# Patient Record
Sex: Female | Born: 1950 | Race: White | Hispanic: No | Smoking: Never smoker
Health system: Southern US, Community
[De-identification: ages and names within clinical notes are randomized; demographics above are authoritative.]

## PROBLEM LIST (undated history)

## (undated) DIAGNOSIS — I10 Essential (primary) hypertension: Secondary | ICD-10-CM

## (undated) DIAGNOSIS — M545 Low back pain, unspecified: Secondary | ICD-10-CM

## (undated) DIAGNOSIS — I509 Heart failure, unspecified: Secondary | ICD-10-CM

## (undated) DIAGNOSIS — F329 Major depressive disorder, single episode, unspecified: Secondary | ICD-10-CM

## (undated) DIAGNOSIS — F32A Depression, unspecified: Secondary | ICD-10-CM

## (undated) DIAGNOSIS — R609 Edema, unspecified: Secondary | ICD-10-CM

## (undated) DIAGNOSIS — R42 Dizziness and giddiness: Secondary | ICD-10-CM

## (undated) DIAGNOSIS — Z8669 Personal history of other diseases of the nervous system and sense organs: Secondary | ICD-10-CM

## (undated) DIAGNOSIS — R112 Nausea with vomiting, unspecified: Secondary | ICD-10-CM

## (undated) DIAGNOSIS — G629 Polyneuropathy, unspecified: Secondary | ICD-10-CM

## (undated) DIAGNOSIS — E785 Hyperlipidemia, unspecified: Secondary | ICD-10-CM

## (undated) DIAGNOSIS — K5900 Constipation, unspecified: Secondary | ICD-10-CM

## (undated) DIAGNOSIS — G4700 Insomnia, unspecified: Secondary | ICD-10-CM

## (undated) DIAGNOSIS — H269 Unspecified cataract: Secondary | ICD-10-CM

## (undated) DIAGNOSIS — R600 Localized edema: Secondary | ICD-10-CM

## (undated) DIAGNOSIS — M1990 Unspecified osteoarthritis, unspecified site: Secondary | ICD-10-CM

## (undated) DIAGNOSIS — Z8709 Personal history of other diseases of the respiratory system: Secondary | ICD-10-CM

## (undated) DIAGNOSIS — R39198 Other difficulties with micturition: Secondary | ICD-10-CM

## (undated) DIAGNOSIS — Z9889 Other specified postprocedural states: Secondary | ICD-10-CM

## (undated) HISTORY — DX: Essential (primary) hypertension: I10

## (undated) HISTORY — PX: UPPER GASTROINTESTINAL ENDOSCOPY: SHX188

## (undated) HISTORY — PX: DILATION AND CURETTAGE OF UTERUS: SHX78

## (undated) HISTORY — PX: HIP SURGERY: SHX245

## (undated) HISTORY — PX: OTHER SURGICAL HISTORY: SHX169

## (undated) HISTORY — DX: Unspecified osteoarthritis, unspecified site: M19.90

## (undated) HISTORY — PX: ESOPHAGOGASTRODUODENOSCOPY: SHX1529

## (undated) HISTORY — PX: COLONOSCOPY: SHX174

---

## 2000-01-23 ENCOUNTER — Inpatient Hospital Stay (HOSPITAL_COMMUNITY): Admission: AD | Admit: 2000-01-23 | Discharge: 2000-01-23 | Payer: Self-pay | Admitting: Obstetrics

## 2000-01-23 ENCOUNTER — Encounter: Payer: Self-pay | Admitting: Obstetrics & Gynecology

## 2000-03-14 ENCOUNTER — Ambulatory Visit (HOSPITAL_COMMUNITY): Admission: RE | Admit: 2000-03-14 | Discharge: 2000-03-14 | Payer: Self-pay | Admitting: Obstetrics and Gynecology

## 2000-06-18 ENCOUNTER — Ambulatory Visit (HOSPITAL_COMMUNITY): Admission: RE | Admit: 2000-06-18 | Discharge: 2000-06-18 | Payer: Self-pay | Admitting: Family Medicine

## 2001-09-23 ENCOUNTER — Ambulatory Visit (HOSPITAL_COMMUNITY): Admission: RE | Admit: 2001-09-23 | Discharge: 2001-09-23 | Payer: Self-pay | Admitting: Internal Medicine

## 2001-09-25 ENCOUNTER — Encounter: Payer: Self-pay | Admitting: Internal Medicine

## 2001-09-25 ENCOUNTER — Ambulatory Visit (HOSPITAL_COMMUNITY): Admission: RE | Admit: 2001-09-25 | Discharge: 2001-09-25 | Payer: Self-pay | Admitting: Internal Medicine

## 2003-11-02 ENCOUNTER — Ambulatory Visit: Payer: Self-pay | Admitting: *Deleted

## 2003-11-15 ENCOUNTER — Ambulatory Visit: Payer: Self-pay | Admitting: *Deleted

## 2003-11-22 ENCOUNTER — Ambulatory Visit: Payer: Self-pay | Admitting: *Deleted

## 2003-11-29 ENCOUNTER — Ambulatory Visit: Payer: Self-pay | Admitting: Nurse Practitioner

## 2004-02-22 ENCOUNTER — Ambulatory Visit: Payer: Self-pay | Admitting: Nurse Practitioner

## 2004-03-08 ENCOUNTER — Ambulatory Visit: Payer: Self-pay | Admitting: Nurse Practitioner

## 2004-03-08 ENCOUNTER — Other Ambulatory Visit: Admission: RE | Admit: 2004-03-08 | Discharge: 2004-03-08 | Payer: Self-pay

## 2004-03-16 ENCOUNTER — Ambulatory Visit (HOSPITAL_COMMUNITY): Admission: RE | Admit: 2004-03-16 | Discharge: 2004-03-16 | Payer: Self-pay | Admitting: Family Medicine

## 2004-05-15 ENCOUNTER — Ambulatory Visit: Payer: Self-pay | Admitting: Nurse Practitioner

## 2004-08-07 ENCOUNTER — Ambulatory Visit: Payer: Self-pay | Admitting: Nurse Practitioner

## 2004-10-31 ENCOUNTER — Ambulatory Visit: Payer: Self-pay | Admitting: Nurse Practitioner

## 2005-01-22 ENCOUNTER — Ambulatory Visit: Payer: Self-pay | Admitting: Nurse Practitioner

## 2005-03-22 ENCOUNTER — Ambulatory Visit: Payer: Self-pay | Admitting: Nurse Practitioner

## 2005-04-13 ENCOUNTER — Ambulatory Visit: Payer: Self-pay | Admitting: Internal Medicine

## 2005-07-05 ENCOUNTER — Ambulatory Visit: Payer: Self-pay | Admitting: Nurse Practitioner

## 2005-07-27 ENCOUNTER — Ambulatory Visit: Payer: Self-pay | Admitting: Nurse Practitioner

## 2005-09-28 ENCOUNTER — Ambulatory Visit: Payer: Self-pay | Admitting: Nurse Practitioner

## 2005-12-20 ENCOUNTER — Ambulatory Visit: Payer: Self-pay | Admitting: Nurse Practitioner

## 2006-01-28 ENCOUNTER — Ambulatory Visit: Payer: Self-pay | Admitting: Nurse Practitioner

## 2006-03-12 ENCOUNTER — Ambulatory Visit: Payer: Self-pay | Admitting: Family Medicine

## 2006-03-25 ENCOUNTER — Ambulatory Visit: Payer: Self-pay | Admitting: Nurse Practitioner

## 2006-03-25 ENCOUNTER — Encounter (INDEPENDENT_AMBULATORY_CARE_PROVIDER_SITE_OTHER): Payer: Self-pay | Admitting: Nurse Practitioner

## 2006-03-25 ENCOUNTER — Other Ambulatory Visit: Admission: RE | Admit: 2006-03-25 | Discharge: 2006-03-25 | Payer: Self-pay | Admitting: Nurse Practitioner

## 2006-03-29 ENCOUNTER — Ambulatory Visit (HOSPITAL_COMMUNITY): Admission: RE | Admit: 2006-03-29 | Discharge: 2006-03-29 | Payer: Self-pay | Admitting: Nurse Practitioner

## 2006-06-06 ENCOUNTER — Ambulatory Visit: Payer: Self-pay | Admitting: Nurse Practitioner

## 2006-08-28 ENCOUNTER — Ambulatory Visit: Payer: Self-pay | Admitting: Family Medicine

## 2006-09-30 ENCOUNTER — Encounter (INDEPENDENT_AMBULATORY_CARE_PROVIDER_SITE_OTHER): Payer: Self-pay | Admitting: Nurse Practitioner

## 2006-09-30 DIAGNOSIS — E119 Type 2 diabetes mellitus without complications: Secondary | ICD-10-CM

## 2006-09-30 DIAGNOSIS — I10 Essential (primary) hypertension: Secondary | ICD-10-CM | POA: Insufficient documentation

## 2006-09-30 DIAGNOSIS — Z9889 Other specified postprocedural states: Secondary | ICD-10-CM

## 2006-11-29 ENCOUNTER — Ambulatory Visit: Payer: Self-pay | Admitting: Internal Medicine

## 2007-02-17 ENCOUNTER — Ambulatory Visit: Payer: Self-pay | Admitting: Internal Medicine

## 2007-05-02 ENCOUNTER — Ambulatory Visit: Payer: Self-pay | Admitting: Internal Medicine

## 2007-05-19 ENCOUNTER — Ambulatory Visit: Payer: Self-pay | Admitting: Internal Medicine

## 2007-08-12 ENCOUNTER — Ambulatory Visit: Payer: Self-pay | Admitting: Internal Medicine

## 2007-11-10 ENCOUNTER — Ambulatory Visit: Payer: Self-pay | Admitting: Internal Medicine

## 2007-11-10 ENCOUNTER — Encounter (INDEPENDENT_AMBULATORY_CARE_PROVIDER_SITE_OTHER): Payer: Self-pay | Admitting: Family Medicine

## 2007-11-10 LAB — CONVERTED CEMR LAB
Albumin: 4.7 g/dL (ref 3.5–5.2)
BUN: 15 mg/dL (ref 6–23)
Calcium: 10.5 mg/dL (ref 8.4–10.5)
Chloride: 100 meq/L (ref 96–112)
Glucose, Bld: 295 mg/dL — ABNORMAL HIGH (ref 70–99)
Potassium: 4 meq/L (ref 3.5–5.3)

## 2007-11-27 ENCOUNTER — Ambulatory Visit: Payer: Self-pay | Admitting: Internal Medicine

## 2007-11-27 ENCOUNTER — Ambulatory Visit (HOSPITAL_COMMUNITY): Admission: RE | Admit: 2007-11-27 | Discharge: 2007-11-27 | Payer: Self-pay | Admitting: Internal Medicine

## 2008-01-14 ENCOUNTER — Ambulatory Visit: Payer: Self-pay | Admitting: Internal Medicine

## 2008-07-20 ENCOUNTER — Encounter (INDEPENDENT_AMBULATORY_CARE_PROVIDER_SITE_OTHER): Payer: Self-pay | Admitting: Internal Medicine

## 2008-07-20 ENCOUNTER — Ambulatory Visit: Payer: Self-pay | Admitting: Internal Medicine

## 2008-07-20 LAB — CONVERTED CEMR LAB
AST: 17 units/L (ref 0–37)
Albumin: 4.4 g/dL (ref 3.5–5.2)
Alkaline Phosphatase: 75 units/L (ref 39–117)
BUN: 24 mg/dL — ABNORMAL HIGH (ref 6–23)
HDL: 31 mg/dL — ABNORMAL LOW (ref >39)
LDL Cholesterol: 72 mg/dL (ref 0–99)
Potassium: 4.2 meq/L (ref 3.5–5.3)
Sodium: 142 meq/L (ref 135–145)
VLDL: 40 mg/dL (ref 0–40)

## 2008-07-28 ENCOUNTER — Ambulatory Visit: Payer: Self-pay | Admitting: Internal Medicine

## 2008-08-25 ENCOUNTER — Ambulatory Visit: Payer: Self-pay | Admitting: Family Medicine

## 2008-10-28 ENCOUNTER — Ambulatory Visit: Payer: Self-pay | Admitting: Internal Medicine

## 2008-10-28 ENCOUNTER — Encounter (INDEPENDENT_AMBULATORY_CARE_PROVIDER_SITE_OTHER): Payer: Self-pay | Admitting: Internal Medicine

## 2008-10-28 LAB — CONVERTED CEMR LAB
Cholesterol: 134 mg/dL (ref 0–200)
Total CHOL/HDL Ratio: 4.3

## 2008-11-04 ENCOUNTER — Ambulatory Visit (HOSPITAL_COMMUNITY): Admission: RE | Admit: 2008-11-04 | Discharge: 2008-11-04 | Payer: Self-pay | Admitting: Internal Medicine

## 2009-01-26 ENCOUNTER — Ambulatory Visit: Payer: Self-pay | Admitting: Internal Medicine

## 2009-01-26 ENCOUNTER — Encounter (INDEPENDENT_AMBULATORY_CARE_PROVIDER_SITE_OTHER): Payer: Self-pay | Admitting: Internal Medicine

## 2009-01-26 LAB — CONVERTED CEMR LAB
HDL: 34 mg/dL — ABNORMAL LOW (ref >39)
LDL Cholesterol: 50 mg/dL (ref 0–99)
Total CHOL/HDL Ratio: 3.2
Triglycerides: 120 mg/dL (ref <150)
VLDL: 24 mg/dL (ref 0–40)

## 2009-01-28 ENCOUNTER — Ambulatory Visit: Payer: Self-pay | Admitting: Internal Medicine

## 2009-01-28 ENCOUNTER — Encounter (INDEPENDENT_AMBULATORY_CARE_PROVIDER_SITE_OTHER): Payer: Self-pay | Admitting: Internal Medicine

## 2009-01-29 ENCOUNTER — Encounter (INDEPENDENT_AMBULATORY_CARE_PROVIDER_SITE_OTHER): Payer: Self-pay | Admitting: Internal Medicine

## 2009-03-25 ENCOUNTER — Ambulatory Visit: Payer: Self-pay | Admitting: Internal Medicine

## 2009-03-25 ENCOUNTER — Telehealth (INDEPENDENT_AMBULATORY_CARE_PROVIDER_SITE_OTHER): Payer: Self-pay | Admitting: *Deleted

## 2009-04-01 ENCOUNTER — Ambulatory Visit: Payer: Self-pay | Admitting: Family Medicine

## 2009-04-26 ENCOUNTER — Encounter (INDEPENDENT_AMBULATORY_CARE_PROVIDER_SITE_OTHER): Payer: Self-pay | Admitting: *Deleted

## 2009-05-17 ENCOUNTER — Ambulatory Visit: Payer: Self-pay | Admitting: Internal Medicine

## 2009-05-17 LAB — CONVERTED CEMR LAB
BUN: 18 mg/dL (ref 6–23)
Calcium: 9.9 mg/dL (ref 8.4–10.5)
Cholesterol: 141 mg/dL (ref 0–200)
Creatinine, Ser: 0.98 mg/dL (ref 0.40–1.20)
Hgb A1c MFr Bld: 5.8 % (ref 4.6–6.1)
Potassium: 4.3 meq/L (ref 3.5–5.3)
Triglycerides: 174 mg/dL — ABNORMAL HIGH (ref <150)

## 2009-05-23 ENCOUNTER — Ambulatory Visit: Payer: Self-pay | Admitting: Internal Medicine

## 2009-07-19 ENCOUNTER — Ambulatory Visit: Payer: Self-pay | Admitting: Internal Medicine

## 2009-07-19 LAB — CONVERTED CEMR LAB
BUN: 18 mg/dL
CO2: 24 meq/L
Calcium: 9.7 mg/dL
Chloride: 103 meq/L
Cholesterol: 139 mg/dL
Creatinine, Ser: 0.96 mg/dL
Glucose, Bld: 89 mg/dL
HDL: 40 mg/dL
Hgb A1c MFr Bld: 5.7 % — ABNORMAL HIGH
LDL Cholesterol: 67 mg/dL
Potassium: 4.2 meq/L
Sodium: 140 meq/L
Total CHOL/HDL Ratio: 3.5
Triglycerides: 162 mg/dL — ABNORMAL HIGH
VLDL: 32 mg/dL

## 2009-08-01 ENCOUNTER — Ambulatory Visit: Payer: Self-pay | Admitting: Internal Medicine

## 2009-11-01 ENCOUNTER — Ambulatory Visit: Payer: Self-pay | Admitting: Internal Medicine

## 2009-11-01 LAB — CONVERTED CEMR LAB
ALT: 16 units/L (ref 0–35)
AST: 21 units/L (ref 0–37)
Alkaline Phosphatase: 62 units/L (ref 39–117)
CO2: 25 meq/L (ref 19–32)
LDL Cholesterol: 56 mg/dL (ref 0–99)
Magnesium: 1.4 mg/dL — ABNORMAL LOW (ref 1.5–2.5)
Sodium: 139 meq/L (ref 135–145)
Total Bilirubin: 0.4 mg/dL (ref 0.3–1.2)
Total Protein: 6.7 g/dL (ref 6.0–8.3)
VLDL: 31 mg/dL (ref 0–40)

## 2009-11-07 ENCOUNTER — Ambulatory Visit: Payer: Self-pay | Admitting: Internal Medicine

## 2010-01-10 ENCOUNTER — Encounter (INDEPENDENT_AMBULATORY_CARE_PROVIDER_SITE_OTHER): Payer: Self-pay | Admitting: *Deleted

## 2010-01-10 LAB — CONVERTED CEMR LAB: Magnesium: 1.6 mg/dL (ref 1.5–2.5)

## 2010-03-28 NOTE — Progress Notes (Signed)
Summary: triage/possible bronchitis  Phone Note Call from Patient   Caller: Patient Reason for Call: Talk to Nurse Summary of Call: Patient called stating she has been sick since Saturday..she has a cold and non-productive cough. She is afraid she has bronchitis and has a runny nose and chest congestiion..her throat is sore.Marland KitchenMarland KitchenThe patient has a history on HTN and DM .Marland Kitchenshe has tried OTC cloricidin and it isn't helping.Marland KitchenMarland KitchenAppointment made for today. Initial call taken by: Conchita Paris,  March 25, 2009 10:37 AM

## 2010-03-28 NOTE — Letter (Signed)
Summary: [facility] Referral (unable to schedule) Notification   Gastroenterology  [address] [place], [place] [address]   Phone: [phone]  Fax: [phone]      April 26, 2009 Jodi Chavez 27-Jan-1951 MRN: [id number]   Dow Adolph, M.D. [address] [place], [place]. [address]    Dear Dr. Audria Nine:   Thank you for your kind referral of the above patient. We have attempted to schedule the recommended Colonoscopy but have been unable to schedule because:  _x_ The patient was not available by phone and/or has not returned our calls.  __ The patient declined to schedule the procedure at this time.  We appreciate the referral and hope that we will have the opportunity to treat this patient in the future.    Sincerely,   [facility]  Vania Rea. Jarold Motto M.D. Hedwig Morton. Juanda Chance M.D. Venita Lick. Russella Dar M.D. Wilhemina Bonito. Marina Goodell M.D. Barbette Hair. Arlyce Dice M.D. Iva Boop M.D. Cheron Every.D.

## 2010-07-14 NOTE — Op Note (Signed)
[facility]  Patient:    Jodi Chavez, Jodi Chavez                       MRN: [id number] Adm. Date:  [id number] Disc. Date: [id number] Attending:  Lendon Colonel                           Operative Report  PREOPERATIVE DIAGNOSES:       1. Abnormal ultrasound.                               2. Postmenopausal bleeding.  POSTOPERATIVE DIAGNOSES:      1. Abnormal ultrasound.                               2. Postmenopausal bleeding.                               3. Uterine enlargement.  PROCEDURE:                    1. Hysteroscopy.                               2. Dilatation and curettage.  SURGEON:                      Katherine Roan, M.D.  ANESTHESIA:  DESCRIPTION OF PROCEDURE:     Patient was carefully placed in lithotomy position with Allen stirrups and cervix dilated and hysteroscope was advanced into the uterus after prep and drape and the bladder was emptied. The uterus was pretty smooth throughout, there were some small polyps in left cornu and in the fundal region and these were resected. A sharp curettage was then accomplished and all of the tissue was sent to the lab for study. Jodi Chavez tolerated the procedure well. Fluid deficit was about 100 cc and she was sent to the recovery room in good addition. DD:  03/14/00 TD:  03/15/00 Job: [id number]

## 2010-07-31 ENCOUNTER — Other Ambulatory Visit (HOSPITAL_COMMUNITY): Payer: Self-pay | Admitting: Family Medicine

## 2010-07-31 DIAGNOSIS — Z1231 Encounter for screening mammogram for malignant neoplasm of breast: Secondary | ICD-10-CM

## 2010-08-10 ENCOUNTER — Ambulatory Visit (HOSPITAL_COMMUNITY)
Admission: RE | Admit: 2010-08-10 | Discharge: 2010-08-10 | Disposition: A | Discharge status: Home / Self Care | Payer: Medicare Other | Source: Ambulatory Visit | Attending: Family Medicine | Admitting: Family Medicine

## 2010-08-10 DIAGNOSIS — Z1231 Encounter for screening mammogram for malignant neoplasm of breast: Secondary | ICD-10-CM | POA: Insufficient documentation

## 2011-05-10 ENCOUNTER — Emergency Department (HOSPITAL_COMMUNITY): Payer: Medicare Other

## 2011-05-10 ENCOUNTER — Emergency Department (HOSPITAL_COMMUNITY)
Admission: EM | Admit: 2011-05-10 | Discharge: 2011-05-10 | Disposition: A | Discharge status: Home / Self Care | Payer: Medicare Other | Attending: Emergency Medicine | Admitting: Emergency Medicine

## 2011-05-10 ENCOUNTER — Encounter (HOSPITAL_COMMUNITY): Payer: Self-pay | Admitting: *Deleted

## 2011-05-10 DIAGNOSIS — M25529 Pain in unspecified elbow: Secondary | ICD-10-CM

## 2011-05-10 DIAGNOSIS — E119 Type 2 diabetes mellitus without complications: Secondary | ICD-10-CM | POA: Insufficient documentation

## 2011-05-10 DIAGNOSIS — I10 Essential (primary) hypertension: Secondary | ICD-10-CM | POA: Insufficient documentation

## 2011-05-10 MED ORDER — OXYCODONE-ACETAMINOPHEN 5-325 MG PO TABS
1.0000 | ORAL_TABLET | Freq: Once | ORAL | Status: AC
  Administered 2011-05-10: 1 via ORAL
  Filled 2011-05-10: qty 1

## 2011-05-10 MED ORDER — HYDROCODONE-ACETAMINOPHEN 5-500 MG PO TABS: 1.0000 | ORAL_TABLET | Freq: Four times a day (QID) | ORAL | Status: AC | PRN

## 2011-05-10 NOTE — ED Notes (Signed)
Pt reports pain at right elbow. States started yesterday, states she slept on it wrong. Pt reports pain with extension and flexion.

## 2011-05-10 NOTE — ED Provider Notes (Signed)
History     CSN: [id number]  Arrival date & time 05/10/11  1514   First MD Initiated Contact with Patient 05/10/11 1543      Chief Complaint  Patient presents with  . Elbow Pain    (Consider location/radiation/quality/duration/timing/severity/associated sxs/prior treatment) Patient is a 61 y.o. female presenting with extremity pain.  Extremity Pain This is a new problem. The current episode started yesterday. The problem occurs constantly. The problem has been gradually worsening. Associated symptoms include arthralgias. Pertinent negatives include no joint swelling, myalgias, numbness or weakness. The symptoms are aggravated by bending. She has tried nothing for the symptoms.    Past Medical History  Diagnosis Date  . Diabetes mellitus   . Hypertension   . Arthritis     Past Surgical History  Procedure Date  . Hip surgery     Family History  Problem Relation Age of Onset  . Cancer Mother   . Stroke Mother   . Diabetes Mother   . Hypertension Mother   . Gout Father     History  Substance Use Topics  . Smoking status: Never Smoker   . Smokeless tobacco: Not on file  . Alcohol Use: No    OB History    Grav Para Term Preterm Abortions TAB SAB Ect Mult Living                  Review of Systems  Musculoskeletal: Positive for arthralgias. Negative for myalgias and joint swelling.  Neurological: Negative for weakness and numbness.  All other systems reviewed and are negative.    Allergies  Seasonal and Naproxen sodium  Home Medications   Current Outpatient Rx  Name Route Sig Dispense Refill  . ACETAMINOPHEN 500 MG PO TABS Oral Take 1,000 mg by mouth every 6 (six) hours as needed. For pain    . BENAZEPRIL HCL 40 MG PO TABS Oral Take 40 mg by mouth daily.    Marland Kitchen CALCIUM CARBONATE-VITAMIN D 500-200 MG-UNIT PO TABS Oral Take 2 tablets by mouth daily.    Marland Kitchen DIPHENHYDRAMINE HCL 25 MG PO TABS Oral Take 25 mg by mouth every 6 (six) hours as needed. For allergies     . EZETIMIBE 10 MG PO TABS Oral Take 10 mg by mouth daily.    Marland Kitchen GABAPENTIN 300 MG PO CAPS Oral Take 300 mg by mouth 3 (three) times daily.    Marland Kitchen GARLIC 1000 MG PO CAPS Oral Take 1 capsule by mouth 3 (three) times daily.    Marland Kitchen GLIMEPIRIDE 2 MG PO TABS Oral Take 2 mg by mouth daily before breakfast.    . HYDROCHLOROTHIAZIDE 25 MG PO TABS Oral Take 25 mg by mouth daily.    Marland Kitchen METFORMIN HCL 850 MG PO TABS Oral Take 850 mg by mouth daily.    . ADULT MULTIVITAMIN W/MINERALS CH Oral Take 1 tablet by mouth daily.    Marland Kitchen PROPRANOLOL HCL 10 MG PO TABS Oral Take 10 mg by mouth 3 (three) times daily.    Marland Kitchen SIMVASTATIN 40 MG PO TABS Oral Take 40 mg by mouth every evening.      BP 159/82  Pulse 88  Temp(Src) 97.4 F (36.3 C) (Oral)  Resp 18  SpO2 97%  Physical Exam  Nursing note and vitals reviewed. Constitutional: She is oriented to person, place, and time. She appears well-developed and well-nourished.  HENT:  Head: Normocephalic and atraumatic.  Eyes: Pupils are equal, round, and reactive to light.  Neck: Normal range of motion.  Neck supple.  Cardiovascular: Normal rate, regular rhythm, normal heart sounds and intact distal pulses.   Pulmonary/Chest: Effort normal and breath sounds normal.  Abdominal: Soft. Bowel sounds are normal.  Musculoskeletal: Normal range of motion. She exhibits tenderness. She exhibits no edema.       Right elbow: She exhibits no swelling and no deformity. tenderness found.       Arms: Lymphadenopathy:    She has no cervical adenopathy.  Neurological: She is alert and oriented to person, place, and time.  Skin: Skin is warm and dry.  Psychiatric: She has a normal mood and affect. Her behavior is normal. Judgment and thought content normal.    ED Course  Procedures (including critical care time)  Labs Reviewed - No data to display No results found.   No diagnosis found.  Radiology results reviewed and discussed with patient.  MDM          Jimmye Norman, NP 05/10/11 2328

## 2011-05-10 NOTE — Discharge Instructions (Signed)
Arthralgia Your caregiver has diagnosed you as suffering from an arthralgia. Arthralgia means there is pain in a joint. This can come from many reasons including:  Bruising the joint which causes soreness (inflammation) in the joint.   Wear and tear on the joints which occur as we grow older (osteoarthritis).   Overusing the joint.   Various forms of arthritis.   Infections of the joint.  Regardless of the cause of pain in your joint, most of these different pains respond to anti-inflammatory drugs and rest. The exception to this is when a joint is infected, and these cases are treated with antibiotics, if it is a bacterial infection. HOME CARE INSTRUCTIONS   Rest the injured area for as long as directed by your caregiver. Then slowly start using the joint as directed by your caregiver and as the pain allows. Crutches as directed may be useful if the ankles, knees or hips are involved. If the knee was splinted or casted, continue use and care as directed. If an stretchy or elastic wrapping bandage has been applied today, it should be removed and re-applied every 3 to 4 hours. It should not be applied tightly, but firmly enough to keep swelling down. Watch toes and feet for swelling, bluish discoloration, coldness, numbness or excessive pain. If any of these problems (symptoms) occur, remove the ace bandage and re-apply more loosely. If these symptoms persist, contact your caregiver or return to this location.   For the first 24 hours, keep the injured extremity elevated on pillows while lying down.   Apply ice for 15 to 20 minutes to the sore joint every couple hours while awake for the first half day. Then 3 to 4 times per day for the first 48 hours. Put the ice in a plastic bag and place a towel between the bag of ice and your skin.   Wear any splinting, casting, elastic bandage applications, or slings as instructed.   Only take over-the-counter or prescription medicines for pain,  discomfort, or fever as directed by your caregiver. Do not use aspirin immediately after the injury unless instructed by your physician. Aspirin can cause increased bleeding and bruising of the tissues.   If you were given crutches, continue to use them as instructed and do not resume weight bearing on the sore joint until instructed.  Persistent pain and inability to use the sore joint as directed for more than 2 to 3 days are warning signs indicating that you should see a caregiver for a follow-up visit as soon as possible. Initially, a hairline fracture (break in bone) may not be evident on X-rays. Persistent pain and swelling indicate that further evaluation, non-weight bearing or use of the joint (use of crutches or slings as instructed), or further X-rays are indicated. X-rays may sometimes not show a small fracture until a week or 10 days later. Make a follow-up appointment with your own caregiver or one to whom we have referred you. A radiologist (specialist in reading X-rays) may read your X-rays. Make sure you know how you are to obtain your X-ray results. Do not assume everything is normal if you do not hear from us. SEEK MEDICAL CARE IF: Bruising, swelling, or pain increases. SEEK IMMEDIATE MEDICAL CARE IF:   Your fingers or toes are numb or blue.   The pain is not responding to medications and continues to stay the same or get worse.   The pain in your joint becomes severe.   You develop a fever over   102 F (38.9 C).   It becomes impossible to move or use the joint.  MAKE SURE YOU:   Understand these instructions.   Will watch your condition.   Will get help right away if you are not doing well or get worse.  Document Released: 02/12/2005 Document Revised: 02/01/2011 Document Reviewed: 10/01/2007 [employer] Patient Information 2012 [employer]. 

## 2011-05-14 ENCOUNTER — Emergency Department (HOSPITAL_COMMUNITY)
Admission: EM | Admit: 2011-05-14 | Discharge: 2011-05-14 | Disposition: A | Discharge status: Home / Self Care | Payer: Medicare Other | Attending: Emergency Medicine | Admitting: Emergency Medicine

## 2011-05-14 ENCOUNTER — Encounter (HOSPITAL_COMMUNITY): Payer: Self-pay

## 2011-05-14 DIAGNOSIS — I10 Essential (primary) hypertension: Secondary | ICD-10-CM | POA: Insufficient documentation

## 2011-05-14 DIAGNOSIS — M25529 Pain in unspecified elbow: Secondary | ICD-10-CM | POA: Insufficient documentation

## 2011-05-14 DIAGNOSIS — M25521 Pain in right elbow: Secondary | ICD-10-CM

## 2011-05-14 DIAGNOSIS — Z79899 Other long term (current) drug therapy: Secondary | ICD-10-CM | POA: Insufficient documentation

## 2011-05-14 DIAGNOSIS — M129 Arthropathy, unspecified: Secondary | ICD-10-CM | POA: Insufficient documentation

## 2011-05-14 DIAGNOSIS — E119 Type 2 diabetes mellitus without complications: Secondary | ICD-10-CM | POA: Insufficient documentation

## 2011-05-14 MED ORDER — MELOXICAM 7.5 MG PO TABS: 7.5000 mg | ORAL_TABLET | Freq: Every day | ORAL | Status: DC

## 2011-05-14 MED ORDER — HYDROCODONE-ACETAMINOPHEN 5-500 MG PO TABS: 1.0000 | ORAL_TABLET | Freq: Three times a day (TID) | ORAL | Status: AC | PRN

## 2011-05-14 NOTE — ED Provider Notes (Signed)
Medical screening examination/treatment/procedure(s) were performed by non-physician practitioner and as supervising physician I was immediately available for consultation/collaboration.  Doug Sou, MD 05/14/11 [phone]

## 2011-05-14 NOTE — Progress Notes (Signed)
Orthopedic Tech Progress Note Patient Details:  Jodi Chavez November 30, 1950 [id number]  Other Ortho Devices Type of Ortho Device: Other (comment) (arm sling) Ortho Device Location: (R) UE Ortho Device Interventions: Application   Jennye Moccasin 05/14/2011, 6:33 PM

## 2011-05-14 NOTE — ED Notes (Signed)
Right arm pain worse since last Wednesday.

## 2011-05-14 NOTE — ED Provider Notes (Signed)
History     CSN: [id number]  Arrival date & time 05/14/11  1424   First MD Initiated Contact with Patient 05/14/11 1750      Chief Complaint  Patient presents with  . Arm Pain    (Consider location/radiation/quality/duration/timing/severity/associated sxs/prior treatment) Patient is a 61 y.o. female presenting with arm pain.  Arm Pain This is a recurrent problem. The current episode started 1 to 4 weeks ago. The problem occurs constantly. The problem has been unchanged. Associated symptoms include arthralgias. The symptoms are aggravated by bending. She has tried oral narcotics for the symptoms. The treatment provided moderate relief.  Patient seen on 05/10/11 for same.  Patient out of pain medication.  Has an appointment with her PCP on 05/28/11.  Past Medical History  Diagnosis Date  . Diabetes mellitus   . Hypertension   . Arthritis     Past Surgical History  Procedure Date  . Hip surgery     Family History  Problem Relation Age of Onset  . Cancer Mother   . Stroke Mother   . Diabetes Mother   . Hypertension Mother   . Gout Father     History  Substance Use Topics  . Smoking status: Never Smoker   . Smokeless tobacco: Not on file  . Alcohol Use: No    OB History    Grav Para Term Preterm Abortions TAB SAB Ect Mult Living                  Review of Systems  Musculoskeletal: Positive for arthralgias.  All other systems reviewed and are negative.    Allergies  Seasonal and Naproxen sodium  Home Medications   Current Outpatient Rx  Name Route Sig Dispense Refill  . ACETAMINOPHEN 500 MG PO TABS Oral Take 1,000 mg by mouth every 6 (six) hours as needed. For pain    . BENAZEPRIL HCL 40 MG PO TABS Oral Take 40 mg by mouth daily.    Marland Kitchen CALCIUM CARBONATE-VITAMIN D 500-200 MG-UNIT PO TABS Oral Take 2 tablets by mouth daily.    Marland Kitchen EZETIMIBE 10 MG PO TABS Oral Take 10 mg by mouth daily.    Marland Kitchen GABAPENTIN 300 MG PO CAPS Oral Take 300 mg by mouth 3 (three) times  daily.    Marland Kitchen GARLIC 1000 MG PO CAPS Oral Take 1 capsule by mouth 3 (three) times daily.    Marland Kitchen GLIMEPIRIDE 2 MG PO TABS Oral Take 2 mg by mouth daily before breakfast.    . HYDROCHLOROTHIAZIDE 25 MG PO TABS Oral Take 25 mg by mouth daily.    Marland Kitchen HYDROCODONE-ACETAMINOPHEN 5-500 MG PO TABS Oral Take 1 tablet by mouth every 6 (six) hours as needed for pain. 10 tablet 0  . METFORMIN HCL 850 MG PO TABS Oral Take 850 mg by mouth daily.    . ADULT MULTIVITAMIN W/MINERALS CH Oral Take 1 tablet by mouth daily.    Marland Kitchen PROPRANOLOL HCL 10 MG PO TABS Oral Take 10 mg by mouth 3 (three) times daily.    Marland Kitchen SIMVASTATIN 40 MG PO TABS Oral Take 40 mg by mouth every evening.    Marland Kitchen HYDROCODONE-ACETAMINOPHEN 5-500 MG PO TABS Oral Take 1 tablet by mouth every 8 (eight) hours as needed for pain. 10 tablet 0  . MELOXICAM 7.5 MG PO TABS Oral Take 1 tablet (7.5 mg total) by mouth daily. 10 tablet 0    BP 150/52  Pulse 77  Temp 97.7 F (36.5 C)  Resp 20  SpO2 99%  Physical Exam  Vitals reviewed. Constitutional: She is oriented to person, place, and time. She appears well-developed and well-nourished.  HENT:  Head: Normocephalic and atraumatic.  Eyes: Conjunctivae are normal. Pupils are equal, round, and reactive to light.  Neck: Normal range of motion. Neck supple.  Cardiovascular: Normal rate, regular rhythm, normal heart sounds and intact distal pulses.   Pulmonary/Chest: Effort normal and breath sounds normal.  Abdominal: Soft. Bowel sounds are normal.  Musculoskeletal: Normal range of motion.       Right elbow: tenderness found. Olecranon process tenderness noted.  Neurological: She is alert and oriented to person, place, and time.  Skin: Skin is warm and dry.  Psychiatric: She has a normal mood and affect. Her behavior is normal. Judgment and thought content normal.    ED Course  Procedures (including critical care time)  Labs Reviewed - No data to display No results found.   No diagnosis found.  Right  elbow pain ? Olecranon bursitis  MDM          Jimmye Norman, NP 05/14/11 1932

## 2011-05-14 NOTE — ED Provider Notes (Signed)
Medical screening examination/treatment/procedure(s) were performed by non-physician practitioner and as supervising physician I was immediately available for consultation/collaboration.  Cheri Guppy, MD 05/14/11 1949

## 2011-05-14 NOTE — Discharge Instructions (Signed)
Olecranon Bursitis Bursitis is swelling and soreness (inflammation) of a fluid-filled sac (bursa) that covers and protects a joint. Olecranon bursitis occurs over the elbow.  CAUSES Bursitis can be caused by injury, overuse of the joint, arthritis, or infection.  SYMPTOMS   Tenderness, swelling, warmth, or redness over the elbow.   Elbow pain with movement. This is greater with bending the elbow.    HOME CARE INSTRUCTIONS   When resting, elevate your elbow above the level of your heart. This helps reduce swelling.   Continue to put the joint through a full range of motion 4 times per day. Rest the injured joint at other times. When the pain lessens, begin normal slow movements and usual activities.   Only take over-the-counter or prescription medicines for pain, discomfort, or fever as directed by your caregiver.   Reduce your intake of milk and related dairy products (cheese, yogurt). They may make your condition worse.  SEEK IMMEDIATE MEDICAL CARE IF:   Your pain increases even during treatment.   You have a fever.   You have heat and inflammation over the bursa and elbow.   You have a red line that goes up your arm.    MAKE SURE YOU:   Understand these instructions.   Will watch your condition.   Will get help right away if you are not doing well or get worse.  Document Released: 03/14/2006 Document Revised: 02/01/2011 Document Reviewed: 01/28/2007 [employer] Patient Information 2012 [employer]  Arm Sling Use The arm sling keeps the injured arm raised up. This helps reduce pain and swelling. HOME CARE INSTRUCTIONS   Adjust the sling to keep your forearm level and comfortable.   Make sure that the hand of the injured arm does not droop down. That could stretch some nerves in the wrist.   Support your hand in the sling if possible.   Let your caregiver know if you have any problems.  SEEK IMMEDIATE MEDICAL CARE IF:   You have an increase in bruising,  swelling, or pain in the area of your injury   You notice a blue color of or coldness in your fingers.   Pain relief is not obtained with medications or any of your problems are getting worse.  MAKE SURE YOU:   Understand these instructions.   Will watch your condition.   Will get help right away if you are not doing well or get worse.  Document Released: 12/24/2003 Document Revised: 02/01/2011 Document Reviewed: 04/21/2007 [facility] Patient Information 2012 [place], [place].Marland Kitchen

## 2011-06-27 ENCOUNTER — Ambulatory Visit (HOSPITAL_COMMUNITY)
Admission: RE | Admit: 2011-06-27 | Discharge: 2011-06-27 | Disposition: A | Discharge status: Home / Self Care | Payer: Medicare Other | Source: Ambulatory Visit | Attending: Chiropractic Medicine | Admitting: Chiropractic Medicine

## 2011-06-27 ENCOUNTER — Other Ambulatory Visit (HOSPITAL_COMMUNITY): Payer: Self-pay | Admitting: Chiropractic Medicine

## 2011-06-27 DIAGNOSIS — M5137 Other intervertebral disc degeneration, lumbosacral region: Secondary | ICD-10-CM | POA: Insufficient documentation

## 2011-06-27 DIAGNOSIS — M51379 Other intervertebral disc degeneration, lumbosacral region without mention of lumbar back pain or lower extremity pain: Secondary | ICD-10-CM | POA: Insufficient documentation

## 2011-06-27 DIAGNOSIS — M542 Cervicalgia: Secondary | ICD-10-CM | POA: Insufficient documentation

## 2011-06-27 DIAGNOSIS — IMO0002 Reserved for concepts with insufficient information to code with codable children: Secondary | ICD-10-CM | POA: Insufficient documentation

## 2011-06-27 DIAGNOSIS — M549 Dorsalgia, unspecified: Secondary | ICD-10-CM

## 2011-10-10 ENCOUNTER — Encounter (HOSPITAL_COMMUNITY): Payer: Self-pay | Admitting: Pharmacy Technician

## 2011-10-11 ENCOUNTER — Other Ambulatory Visit (HOSPITAL_COMMUNITY): Payer: Self-pay

## 2011-10-11 NOTE — H&P (Signed)
Jodi Chavez is an 61 y.o. female.   Chief Complaint: neck and right arm pain HPI:  She has been experiencing pain in her neck, radiation into her right arm and this has been going on since 05/10/2011.  She has been wearing a sling for right shoulder and arm discomfort.  Sling was applied in the emergency room on 05/14/2011. She has been taking the sling off intermittently and moving the shoulder and arm with persistent severe pain into her arm.  She underwent evaluation by Dr. Prince Rome and was felt to possibly have a cervical disk herniation.  She was seen by Dr. Hollice Espy and x-rays were done and her pain persists.  She underwent MRI scan of the cervical spine.  It showed bulges and disk protrusions at 3 different segments, C5-6, C6-7, C7-T1.  There were findings consistent with some flattening and deformity of the cord.  Her exam was nonfocal. She has severe pain in her neck into her arm.  She reports it is not as bad as what it was when it first started 3 months ago, but it is enough so that she continues to require narcotic medicines. Pain is radiating from her right shoulder down into the right elbow.  It does not radiate to any large extent into the right hand. She has pain when she coughs or sneezes, radiates down the right shoulder into the right arm.  She experiences pain into the right anterolateral upper arm.     Past Medical History  Diagnosis Date  . PONV (postoperative nausea and vomiting)   . Hypertension     takes Benazepril nightly and Propranolol tid and Clonidine daily  . Hyperlipidemia     takes Zetia and Zocor daily  . History of bronchitis     last time several yrs ago  . History of migraine     many yrs ago  . Dizziness     occasionally and related to meds   . Peripheral edema   . Peripheral neuropathy   . Arthritis     hip  . Low back pain   . Constipation     uses laxatives several times a week  . Slow urinary stream     occasionally  . Diabetes mellitus     takes  Metformin and Amaryl daily  . Early cataracts, bilateral   . Depression   . Insomnia     Past Surgical History  Procedure Date  . 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 D&c/hysteroscopy/ablation   . Dilation and curettage of uterus     couple of times  . Colonoscopy   . 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 Cancer Mother   . Stroke Mother   . Diabetes Mother   . Hypertension Mother   . Gout Father    Social History:  reports that she has never smoked. She does not have any smokeless tobacco history on file. She reports that she does not drink alcohol or use illicit drugs.  Allergies:  Allergies  Allergen Reactions  . Naproxen Sodium Rash    No prescriptions prior to admission    Results for orders placed during the hospital encounter of 10/12/11 (from the past 48 hour(s))  BASIC METABOLIC PANEL     Status: Abnormal   Collection Time   10/12/11  1:34 PM      Component Value Range Comment   Sodium 141  135 - 145 mEq/L    Potassium 4.2  3.5 - 5.1 mEq/L    Chloride 100  96 - 112 mEq/L    CO2 32  19 - 32 mEq/L    Glucose, Bld 106 (*) 70 - 99 mg/dL    BUN 17  6 - 23 mg/dL    Creatinine, Ser 2.13  0.50 - 1.10 mg/dL    Calcium 08.6  8.4 - 10.5 mg/dL    GFR calc non Af Amer 55 (*) >90 mL/min    GFR calc Af Amer 64 (*) >90 mL/min   CBC     Status: Abnormal   Collection Time   10/12/11  1:34 PM      Component Value Range Comment   WBC 12.2 (*) 4.0 - 10.5 K/uL    RBC 4.47  3.87 - 5.11 MIL/uL    Hemoglobin 14.7  12.0 - 15.0 g/dL    HCT 57.8  46.9 - 62.9 %    MCV 94.9  78.0 - 100.0 fL    MCH 32.9  26.0 - 34.0 pg    MCHC 34.7  30.0 - 36.0 g/dL    RDW 52.8  41.3 - 24.4 %    Platelets 201  150 - 400 K/uL   URINALYSIS, ROUTINE W REFLEX MICROSCOPIC     Status: Abnormal   Collection Time   10/12/11   1:38 PM      Component Value Range Comment   Color, Urine YELLOW  YELLOW    APPearance CLEAR  CLEAR    Specific Gravity, Urine 1.009  1.005 - 1.030    pH 7.0  5.0 - 8.0    Glucose, UA NEGATIVE  NEGATIVE mg/dL    Hgb urine dipstick NEGATIVE  NEGATIVE    Bilirubin Urine NEGATIVE  NEGATIVE    Ketones, ur NEGATIVE  NEGATIVE mg/dL    Protein, ur NEGATIVE  NEGATIVE mg/dL    Urobilinogen, UA 0.2  0.0 - 1.0 mg/dL    Nitrite NEGATIVE  NEGATIVE    Leukocytes, UA TRACE (*) NEGATIVE   SURGICAL PCR SCREEN     Status: Normal   Collection Time   10/12/11  1:38 PM      Component Value Range Comment   MRSA, PCR NEGATIVE  NEGATIVE    Staphylococcus aureus NEGATIVE  NEGATIVE   URINE MICROSCOPIC-ADD ON     Status: Normal   Collection Time   10/12/11  1:38 PM      Component Value Range Comment   Squamous Epithelial / LPF RARE  RARE    WBC, UA 0-2  <3 WBC/hpf    Dg Chest 2 View  10/12/2011  *RADIOLOGY REPORT*  Clinical Data: Preoperative respiratory exam.  Cervical disc disease.  CHEST - 2 VIEW  Comparison: Thoracic radiographs dated 06/27/2011  Findings: Heart size and vascularity are normal and the lungs are clear.  No significant osseous abnormality.  Degenerative changes in the thoracic spine and upper lumbar spine, stable.  IMPRESSION: No acute abnormalities.  Original Report Authenticated By: Gwynn Burly, M.D.    Review of Systems  Constitutional: Negative.   HENT: Positive for neck pain.   Eyes: Negative.   Respiratory: Negative.   Cardiovascular: Negative.   Gastrointestinal: Negative.   Genitourinary: Negative.   Musculoskeletal:       Right arm pain.  Wearing sling to right arm as it helps with the pain.  Periscapular pain  Skin: Negative.   Neurological: Negative.   Endo/Heme/Allergies: Negative.   Psychiatric/Behavioral: Negative.     There were no vitals taken for this visit. Physical Exam  Constitutional: She is oriented to person, place, and time. She appears  well-developed and well-nourished.  HENT:  Head: Normocephalic and atraumatic.  Eyes: EOM are normal. Pupils are equal, round, and reactive to light.  Neck:       Painful ROM c spine.  +spurlings to right  Cardiovascular: Normal rate and regular rhythm.   Respiratory: Effort normal and breath sounds normal.  GI: Soft. Bowel sounds are normal.  Musculoskeletal:         Her right arm shows weakness in right arm pronation compared to the left.  She is right hand dominant.  She also is weak in right hand abduction of the fingers. Range of motion of the right shoulder is not particularly painful.  She has forward bending chin nearly to the chest or sternum.  Extension is diminished by about 30%.  Lateral bending rotation with mild discomfort when turning the right compared with the left, but overall her loss of motion is relatively small, perhaps 10% to either side.    Neurological: She is alert and oriented to person, place, and time.  Skin: Skin is warm and dry.  Psychiatric: She has a normal mood and affect.     Assessment/Plan Herniated nucleus pulposus right C6-7 and right C7-T1  PLAN:  Right C6-7 and Right C7-T1 foraminotomy with excision of HNP by Dr Christene Slates 10/12/2011, 5:10 PM

## 2011-10-12 ENCOUNTER — Encounter (HOSPITAL_COMMUNITY)
Admission: RE | Admit: 2011-10-12 | Discharge: 2011-10-12 | Disposition: A | Discharge status: Home / Self Care | Payer: Medicare Other | Source: Ambulatory Visit | Attending: Specialist | Admitting: Specialist

## 2011-10-12 ENCOUNTER — Encounter (HOSPITAL_COMMUNITY): Payer: Self-pay

## 2011-10-12 ENCOUNTER — Encounter (HOSPITAL_COMMUNITY)
Admission: RE | Admit: 2011-10-12 | Discharge: 2011-10-12 | Disposition: A | Discharge status: Home / Self Care | Payer: Medicare Other | Source: Ambulatory Visit | Attending: Physician Assistant | Admitting: Physician Assistant

## 2011-10-12 HISTORY — DX: Nausea with vomiting, unspecified: R11.2

## 2011-10-12 HISTORY — DX: Personal history of other diseases of the nervous system and sense organs: Z86.69

## 2011-10-12 HISTORY — DX: Constipation, unspecified: K59.00

## 2011-10-12 HISTORY — DX: Major depressive disorder, single episode, unspecified: F32.9

## 2011-10-12 HISTORY — DX: Dizziness and giddiness: R42

## 2011-10-12 HISTORY — DX: Low back pain: M54.5

## 2011-10-12 HISTORY — DX: Personal history of other diseases of the respiratory system: Z87.09

## 2011-10-12 HISTORY — DX: Unspecified cataract: H26.9

## 2011-10-12 HISTORY — DX: Edema, unspecified: R60.9

## 2011-10-12 HISTORY — DX: Other difficulties with micturition: R39.198

## 2011-10-12 HISTORY — DX: Insomnia, unspecified: G47.00

## 2011-10-12 HISTORY — DX: Low back pain, unspecified: M54.50

## 2011-10-12 HISTORY — DX: Hyperlipidemia, unspecified: E78.5

## 2011-10-12 HISTORY — DX: Localized edema: R60.0

## 2011-10-12 HISTORY — DX: Polyneuropathy, unspecified: G62.9

## 2011-10-12 HISTORY — DX: Other specified postprocedural states: Z98.890

## 2011-10-12 HISTORY — DX: Depression, unspecified: F32.A

## 2011-10-12 LAB — CBC
HCT: 42.4 % (ref 36.0–46.0)
Platelets: 201 10*3/uL (ref 150–400)
RBC: 4.47 MIL/uL (ref 3.87–5.11)
RDW: 12.6 % (ref 11.5–15.5)
WBC: 12.2 10*3/uL — ABNORMAL HIGH (ref 4.0–10.5)

## 2011-10-12 LAB — URINALYSIS, ROUTINE W REFLEX MICROSCOPIC
Nitrite: NEGATIVE
Specific Gravity, Urine: 1.009 (ref 1.005–1.030)
Urobilinogen, UA: 0.2 mg/dL (ref 0.0–1.0)
pH: 7 (ref 5.0–8.0)

## 2011-10-12 LAB — BASIC METABOLIC PANEL
CO2: 32 mEq/L (ref 19–32)
Calcium: 10.5 mg/dL (ref 8.4–10.5)
Chloride: 100 mEq/L (ref 96–112)
Creatinine, Ser: 1.07 mg/dL (ref 0.50–1.10)
GFR calc Af Amer: 64 mL/min — ABNORMAL LOW (ref >90)
Sodium: 141 mEq/L (ref 135–145)

## 2011-10-12 LAB — SURGICAL PCR SCREEN
MRSA, PCR: NEGATIVE
Staphylococcus aureus: NEGATIVE

## 2011-10-12 LAB — URINE MICROSCOPIC-ADD ON

## 2011-10-12 MED ORDER — CHLORHEXIDINE GLUCONATE 4 % EX LIQD: 60.0000 mL | Freq: Once | CUTANEOUS | Status: DC

## 2011-10-12 NOTE — Progress Notes (Signed)
Pt doesn't have a cardiologist  Denies ever having a heart cath or stress test Echo done in 1993-everything normal and found to be stress related   MEdical MD is Georganna Skeans @ [facility]  Denies recent ekg or cxr

## 2011-10-12 NOTE — Pre-Procedure Instructions (Signed)
20 Maheen P Hallstrom  10/12/2011   Your procedure is scheduled on:  Mon, Aug 19 @ 8:00 AM  Report to [facility] at 6:00 AM.  Call this number if you have problems the morning of surgery: [phone]   Remember:   Do not eat food:After Midnight.    Take these medicines the morning of surgery with A SIP OF WATER: Clonidine(Catapres),Propranolol(Inderal),and Pain Pill(if needed)   Do not wear jewelry, make-up or nail polish.  Do not wear lotions, powders, or perfumes.   Do not shave 48 hours prior to surgery.  Do not bring valuables to the hospital.  Contacts, dentures or bridgework may not be worn into surgery.  Leave suitcase in the car. After surgery it may be brought to your room.  For patients admitted to the hospital, checkout time is 11:00 AM the day of discharge.   Patients discharged the day of surgery will not be allowed to drive home.  Special Instructions: Incentive Spirometry - Practice and bring it with you on the day of surgery. and CHG Shower Use Special Wash: 1/2 bottle night before surgery and 1/2 bottle morning of surgery.   Please read over the following fact sheets that you were given: Pain Booklet, Coughing and Deep Breathing, MRSA Information and Surgical Site Infection Prevention

## 2011-10-14 MED ORDER — CEFAZOLIN SODIUM 10 G IJ SOLR
3.0000 g | INTRAMUSCULAR | Status: AC
  Administered 2011-10-15: 3 g via INTRAVENOUS
  Filled 2011-10-14: qty 3000

## 2011-10-15 ENCOUNTER — Encounter (HOSPITAL_COMMUNITY): Payer: Self-pay | Admitting: Specialist

## 2011-10-15 ENCOUNTER — Encounter (HOSPITAL_COMMUNITY): Payer: Self-pay | Admitting: Certified Registered"

## 2011-10-15 ENCOUNTER — Encounter (HOSPITAL_COMMUNITY): Payer: Self-pay | Admitting: *Deleted

## 2011-10-15 ENCOUNTER — Inpatient Hospital Stay (HOSPITAL_COMMUNITY): Payer: Medicare Other

## 2011-10-15 ENCOUNTER — Inpatient Hospital Stay (HOSPITAL_COMMUNITY): Payer: Medicare Other | Admitting: Certified Registered"

## 2011-10-15 ENCOUNTER — Encounter (HOSPITAL_COMMUNITY)
Admission: RE | Disposition: A | Discharge status: Skilled Nursing Facility | Payer: Self-pay | Source: Ambulatory Visit | Attending: Specialist

## 2011-10-15 ENCOUNTER — Inpatient Hospital Stay (HOSPITAL_COMMUNITY)
Admission: RE | Admit: 2011-10-15 | Discharge: 2011-10-19 | DRG: 491 | Disposition: A | Discharge status: Skilled Nursing Facility | Payer: Medicare Other | Source: Ambulatory Visit | Attending: Specialist | Admitting: Specialist

## 2011-10-15 DIAGNOSIS — M47812 Spondylosis without myelopathy or radiculopathy, cervical region: Secondary | ICD-10-CM | POA: Diagnosis present

## 2011-10-15 DIAGNOSIS — R339 Retention of urine, unspecified: Secondary | ICD-10-CM

## 2011-10-15 DIAGNOSIS — F329 Major depressive disorder, single episode, unspecified: Secondary | ICD-10-CM | POA: Diagnosis present

## 2011-10-15 DIAGNOSIS — G609 Hereditary and idiopathic neuropathy, unspecified: Secondary | ICD-10-CM | POA: Diagnosis present

## 2011-10-15 DIAGNOSIS — M5020 Other cervical disc displacement, unspecified cervical region: Secondary | ICD-10-CM | POA: Diagnosis present

## 2011-10-15 DIAGNOSIS — K5900 Constipation, unspecified: Secondary | ICD-10-CM | POA: Diagnosis present

## 2011-10-15 DIAGNOSIS — G4700 Insomnia, unspecified: Secondary | ICD-10-CM | POA: Diagnosis present

## 2011-10-15 DIAGNOSIS — E785 Hyperlipidemia, unspecified: Secondary | ICD-10-CM | POA: Diagnosis present

## 2011-10-15 DIAGNOSIS — M4722 Other spondylosis with radiculopathy, cervical region: Secondary | ICD-10-CM | POA: Diagnosis present

## 2011-10-15 DIAGNOSIS — I10 Essential (primary) hypertension: Secondary | ICD-10-CM | POA: Diagnosis present

## 2011-10-15 DIAGNOSIS — F3289 Other specified depressive episodes: Secondary | ICD-10-CM | POA: Diagnosis present

## 2011-10-15 DIAGNOSIS — E119 Type 2 diabetes mellitus without complications: Secondary | ICD-10-CM | POA: Diagnosis present

## 2011-10-15 HISTORY — PX: POSTERIOR CERVICAL FUSION/FORAMINOTOMY: SHX5038

## 2011-10-15 LAB — GLUCOSE, CAPILLARY: Glucose-Capillary: 125 mg/dL — ABNORMAL HIGH (ref 70–99)

## 2011-10-15 SURGERY — POSTERIOR CERVICAL FUSION/FORAMINOTOMY LEVEL 2
Anesthesia: General | Site: Neck | Wound class: Clean

## 2011-10-15 MED ORDER — MENTHOL 3 MG MT LOZG: 1.0000 | LOZENGE | OROMUCOSAL | Status: DC | PRN

## 2011-10-15 MED ORDER — BUPIVACAINE-EPINEPHRINE (PF) 0.5% -1:200000 IJ SOLN
INTRAMUSCULAR | Status: AC
  Filled 2011-10-15: qty 10

## 2011-10-15 MED ORDER — NEOSTIGMINE METHYLSULFATE 1 MG/ML IJ SOLN
INTRAMUSCULAR | Status: DC | PRN
  Administered 2011-10-15: 4 mg via INTRAVENOUS

## 2011-10-15 MED ORDER — SIMVASTATIN 40 MG PO TABS
40.0000 mg | ORAL_TABLET | Freq: Every evening | ORAL | Status: DC
  Administered 2011-10-16 – 2011-10-18 (×3): 40 mg via ORAL
  Filled 2011-10-15 (×5): qty 1

## 2011-10-15 MED ORDER — POTASSIUM CHLORIDE IN NACL 20-0.9 MEQ/L-% IV SOLN
INTRAVENOUS | Status: DC
  Administered 2011-10-15 – 2011-10-16 (×2): via INTRAVENOUS
  Filled 2011-10-15 (×8): qty 1000

## 2011-10-15 MED ORDER — OVER THE COUNTER MEDICATION: 1.0000 "application " | Freq: Every day | Status: DC

## 2011-10-15 MED ORDER — BENAZEPRIL HCL 40 MG PO TABS
40.0000 mg | ORAL_TABLET | Freq: Every evening | ORAL | Status: DC
  Administered 2011-10-16 – 2011-10-18 (×3): 40 mg via ORAL
  Filled 2011-10-15 (×5): qty 1

## 2011-10-15 MED ORDER — CEFAZOLIN SODIUM 1-5 GM-% IV SOLN
INTRAVENOUS | Status: AC
  Filled 2011-10-15: qty 50

## 2011-10-15 MED ORDER — FENTANYL CITRATE 0.05 MG/ML IJ SOLN
INTRAMUSCULAR | Status: DC | PRN
  Administered 2011-10-15: 50 ug via INTRAVENOUS
  Administered 2011-10-15: 150 ug via INTRAVENOUS
  Administered 2011-10-15: 50 ug via INTRAVENOUS

## 2011-10-15 MED ORDER — CEFAZOLIN SODIUM-DEXTROSE 2-3 GM-% IV SOLR
INTRAVENOUS | Status: AC
  Filled 2011-10-15: qty 50

## 2011-10-15 MED ORDER — PROPRANOLOL HCL 10 MG PO TABS
10.0000 mg | ORAL_TABLET | Freq: Three times a day (TID) | ORAL | Status: DC
  Administered 2011-10-15 – 2011-10-19 (×12): 10 mg via ORAL
  Filled 2011-10-15 (×14): qty 1

## 2011-10-15 MED ORDER — EZETIMIBE 10 MG PO TABS
10.0000 mg | ORAL_TABLET | Freq: Every day | ORAL | Status: DC
  Administered 2011-10-16 – 2011-10-19 (×4): 10 mg via ORAL
  Filled 2011-10-15 (×5): qty 1

## 2011-10-15 MED ORDER — DOCUSATE SODIUM 100 MG PO CAPS
100.0000 mg | ORAL_CAPSULE | Freq: Two times a day (BID) | ORAL | Status: DC
  Administered 2011-10-15 – 2011-10-19 (×8): 100 mg via ORAL
  Filled 2011-10-15 (×9): qty 1

## 2011-10-15 MED ORDER — DIPHENHYDRAMINE HCL 25 MG PO CAPS: 25.0000 mg | ORAL_CAPSULE | Freq: Three times a day (TID) | ORAL | Status: AC | PRN

## 2011-10-15 MED ORDER — INSULIN ASPART 100 UNIT/ML ~~LOC~~ SOLN
0.0000 [IU] | Freq: Three times a day (TID) | SUBCUTANEOUS | Status: DC
  Administered 2011-10-15: 2 [IU] via SUBCUTANEOUS
  Administered 2011-10-16: 3 [IU] via SUBCUTANEOUS
  Administered 2011-10-16 – 2011-10-17 (×3): 2 [IU] via SUBCUTANEOUS

## 2011-10-15 MED ORDER — CEFAZOLIN SODIUM 1-5 GM-% IV SOLN
1.0000 g | Freq: Three times a day (TID) | INTRAVENOUS | Status: AC
  Administered 2011-10-15 – 2011-10-16 (×2): 1 g via INTRAVENOUS
  Filled 2011-10-15 (×2): qty 50

## 2011-10-15 MED ORDER — ACETAMINOPHEN 325 MG PO TABS: 650.0000 mg | ORAL_TABLET | ORAL | Status: DC | PRN

## 2011-10-15 MED ORDER — GABAPENTIN 300 MG PO CAPS
300.0000 mg | ORAL_CAPSULE | Freq: Three times a day (TID) | ORAL | Status: DC
  Administered 2011-10-15 – 2011-10-18 (×10): 300 mg via ORAL
  Filled 2011-10-15 (×15): qty 1

## 2011-10-15 MED ORDER — ADULT MULTIVITAMIN W/MINERALS CH
1.0000 | ORAL_TABLET | Freq: Every day | ORAL | Status: DC
  Administered 2011-10-16 – 2011-10-19 (×4): 1 via ORAL
  Filled 2011-10-15 (×5): qty 1

## 2011-10-15 MED ORDER — CLONIDINE HCL 0.1 MG PO TABS
0.1000 mg | ORAL_TABLET | Freq: Two times a day (BID) | ORAL | Status: DC
  Administered 2011-10-16 – 2011-10-19 (×7): 0.1 mg via ORAL
  Filled 2011-10-15 (×9): qty 1

## 2011-10-15 MED ORDER — LIDOCAINE HCL 4 % MT SOLN
OROMUCOSAL | Status: DC | PRN
  Administered 2011-10-15: 4 mL via TOPICAL

## 2011-10-15 MED ORDER — OXYCODONE-ACETAMINOPHEN 5-325 MG PO TABS
1.0000 | ORAL_TABLET | ORAL | Status: DC | PRN
  Administered 2011-10-17 – 2011-10-18 (×4): 2 via ORAL
  Filled 2011-10-15 (×4): qty 2

## 2011-10-15 MED ORDER — SODIUM CHLORIDE 0.9 % IV SOLN: 250.0000 mL | INTRAVENOUS | Status: DC

## 2011-10-15 MED ORDER — LACTATED RINGERS IV SOLN
INTRAVENOUS | Status: DC | PRN
  Administered 2011-10-15 (×3): via INTRAVENOUS

## 2011-10-15 MED ORDER — ONDANSETRON HCL 4 MG/2ML IJ SOLN
INTRAMUSCULAR | Status: AC
  Filled 2011-10-15: qty 2

## 2011-10-15 MED ORDER — BUPIVACAINE-EPINEPHRINE 0.5% -1:200000 IJ SOLN
INTRAMUSCULAR | Status: DC | PRN
  Administered 2011-10-15: 10 mL

## 2011-10-15 MED ORDER — PROPOFOL 10 MG/ML IV EMUL
INTRAVENOUS | Status: DC | PRN
  Administered 2011-10-15: 200 mg via INTRAVENOUS

## 2011-10-15 MED ORDER — ONDANSETRON HCL 4 MG/2ML IJ SOLN
4.0000 mg | Freq: Once | INTRAMUSCULAR | Status: AC | PRN
  Administered 2011-10-15: 4 mg via INTRAVENOUS

## 2011-10-15 MED ORDER — ACETAMINOPHEN 500 MG PO TABS
1000.0000 mg | ORAL_TABLET | Freq: Two times a day (BID) | ORAL | Status: DC
  Administered 2011-10-15 – 2011-10-19 (×4): 1000 mg via ORAL
  Filled 2011-10-15 (×9): qty 2

## 2011-10-15 MED ORDER — ONDANSETRON HCL 4 MG/2ML IJ SOLN
INTRAMUSCULAR | Status: DC | PRN
  Administered 2011-10-15 (×2): 4 mg via INTRAVENOUS

## 2011-10-15 MED ORDER — HYDROCODONE-ACETAMINOPHEN 5-325 MG PO TABS
1.0000 | ORAL_TABLET | ORAL | Status: DC | PRN
  Administered 2011-10-16 – 2011-10-19 (×4): 2 via ORAL
  Filled 2011-10-15 (×5): qty 2

## 2011-10-15 MED ORDER — GLIMEPIRIDE 2 MG PO TABS
2.0000 mg | ORAL_TABLET | Freq: Every day | ORAL | Status: DC
  Administered 2011-10-17 – 2011-10-19 (×3): 2 mg via ORAL
  Filled 2011-10-15 (×5): qty 1

## 2011-10-15 MED ORDER — HYDROMORPHONE HCL PF 1 MG/ML IJ SOLN
0.2500 mg | INTRAMUSCULAR | Status: DC | PRN
  Administered 2011-10-15 (×2): 0.5 mg via INTRAVENOUS

## 2011-10-15 MED ORDER — HYDROMORPHONE HCL PF 1 MG/ML IJ SOLN
INTRAMUSCULAR | Status: AC
  Filled 2011-10-15: qty 1

## 2011-10-15 MED ORDER — SODIUM CHLORIDE 0.9 % IJ SOLN: 3.0000 mL | INTRAMUSCULAR | Status: DC | PRN

## 2011-10-15 MED ORDER — BISACODYL 10 MG RE SUPP
10.0000 mg | Freq: Every day | RECTAL | Status: DC | PRN
  Filled 2011-10-15: qty 1

## 2011-10-15 MED ORDER — ALUM & MAG HYDROXIDE-SIMETH 200-200-20 MG/5ML PO SUSP: 30.0000 mL | Freq: Four times a day (QID) | ORAL | Status: DC | PRN

## 2011-10-15 MED ORDER — THROMBIN 20000 UNITS EX SOLR
OROMUCOSAL | Status: DC | PRN
  Administered 2011-10-15: 10:00:00 via TOPICAL

## 2011-10-15 MED ORDER — SWEEN 24 EX CREA
1.0000 "application " | TOPICAL_CREAM | Freq: Every day | CUTANEOUS | Status: DC
  Administered 2011-10-17: 1 via CUTANEOUS

## 2011-10-15 MED ORDER — EPHEDRINE SULFATE 50 MG/ML IJ SOLN
INTRAMUSCULAR | Status: DC | PRN
  Administered 2011-10-15 (×4): 5 mg via INTRAVENOUS
  Administered 2011-10-15: 10 mg via INTRAVENOUS
  Administered 2011-10-15 (×2): 5 mg via INTRAVENOUS
  Administered 2011-10-15: 10 mg via INTRAVENOUS

## 2011-10-15 MED ORDER — ZOLPIDEM TARTRATE 5 MG PO TABS: 5.0000 mg | ORAL_TABLET | Freq: Every evening | ORAL | Status: DC | PRN

## 2011-10-15 MED ORDER — METHOCARBAMOL 500 MG PO TABS
500.0000 mg | ORAL_TABLET | Freq: Four times a day (QID) | ORAL | Status: DC | PRN
  Administered 2011-10-16 – 2011-10-19 (×4): 500 mg via ORAL
  Filled 2011-10-15 (×5): qty 1

## 2011-10-15 MED ORDER — SODIUM CHLORIDE 0.9 % IJ SOLN
3.0000 mL | Freq: Two times a day (BID) | INTRAMUSCULAR | Status: DC
  Administered 2011-10-18: 3 mL via INTRAVENOUS

## 2011-10-15 MED ORDER — HYDROCHLOROTHIAZIDE 25 MG PO TABS
25.0000 mg | ORAL_TABLET | Freq: Every day | ORAL | Status: DC
  Administered 2011-10-16 – 2011-10-19 (×3): 25 mg via ORAL
  Filled 2011-10-15 (×5): qty 1

## 2011-10-15 MED ORDER — LIDOCAINE HCL (CARDIAC) 20 MG/ML IV SOLN
INTRAVENOUS | Status: DC | PRN
  Administered 2011-10-15: 70 mg via INTRAVENOUS

## 2011-10-15 MED ORDER — DROPERIDOL 2.5 MG/ML IJ SOLN
0.6250 mg | Freq: Once | INTRAMUSCULAR | Status: AC
  Administered 2011-10-15: 0.625 mg via INTRAVENOUS

## 2011-10-15 MED ORDER — SCOPOLAMINE 1 MG/3DAYS TD PT72
MEDICATED_PATCH | TRANSDERMAL | Status: DC | PRN
  Administered 2011-10-15: 1 via TRANSDERMAL

## 2011-10-15 MED ORDER — METHOCARBAMOL 100 MG/ML IJ SOLN
500.0000 mg | Freq: Four times a day (QID) | INTRAVENOUS | Status: DC | PRN
  Filled 2011-10-15: qty 5

## 2011-10-15 MED ORDER — ROCURONIUM BROMIDE 100 MG/10ML IV SOLN
INTRAVENOUS | Status: DC | PRN
  Administered 2011-10-15: 50 mg via INTRAVENOUS
  Administered 2011-10-15 (×2): 10 mg via INTRAVENOUS
  Administered 2011-10-15: 20 mg via INTRAVENOUS

## 2011-10-15 MED ORDER — THROMBIN 20000 UNITS EX SOLR
CUTANEOUS | Status: AC
  Filled 2011-10-15: qty 20000

## 2011-10-15 MED ORDER — ONDANSETRON HCL 4 MG/2ML IJ SOLN
4.0000 mg | INTRAMUSCULAR | Status: DC | PRN
  Administered 2011-10-15 – 2011-10-19 (×10): 4 mg via INTRAVENOUS
  Filled 2011-10-15 (×10): qty 2

## 2011-10-15 MED ORDER — HYDROMORPHONE HCL PF 1 MG/ML IJ SOLN
0.5000 mg | INTRAMUSCULAR | Status: DC | PRN
  Administered 2011-10-15 – 2011-10-19 (×12): 1 mg via INTRAVENOUS
  Filled 2011-10-15 (×13): qty 1

## 2011-10-15 MED ORDER — MAGNESIUM CITRATE PO SOLN
1.0000 | Freq: Once | ORAL | Status: AC | PRN
  Filled 2011-10-15: qty 296

## 2011-10-15 MED ORDER — DROPERIDOL 2.5 MG/ML IJ SOLN
INTRAMUSCULAR | Status: AC
  Filled 2011-10-15: qty 2

## 2011-10-15 MED ORDER — ACETAMINOPHEN 650 MG RE SUPP: 650.0000 mg | RECTAL | Status: DC | PRN

## 2011-10-15 MED ORDER — CALCIUM CARBONATE-VITAMIN D 500-200 MG-UNIT PO TABS
1.0000 | ORAL_TABLET | Freq: Every day | ORAL | Status: DC
  Administered 2011-10-16 – 2011-10-19 (×4): 1 via ORAL
  Filled 2011-10-15 (×5): qty 1

## 2011-10-15 MED ORDER — 0.9 % SODIUM CHLORIDE (POUR BTL) OPTIME
TOPICAL | Status: DC | PRN
  Administered 2011-10-15: 1000 mL

## 2011-10-15 MED ORDER — POLYETHYLENE GLYCOL 3350 17 G PO PACK: 17.0000 g | PACK | Freq: Every day | ORAL | Status: DC | PRN

## 2011-10-15 MED ORDER — METFORMIN HCL 850 MG PO TABS
850.0000 mg | ORAL_TABLET | Freq: Every day | ORAL | Status: DC
  Administered 2011-10-17 – 2011-10-19 (×3): 850 mg via ORAL
  Filled 2011-10-15 (×5): qty 1

## 2011-10-15 MED ORDER — GLYCOPYRROLATE 0.2 MG/ML IJ SOLN
INTRAMUSCULAR | Status: DC | PRN
  Administered 2011-10-15: .6 mg via INTRAVENOUS
  Administered 2011-10-15: 0.2 mg via INTRAVENOUS

## 2011-10-15 MED ORDER — PHENOL 1.4 % MT LIQD
1.0000 | OROMUCOSAL | Status: DC | PRN
  Administered 2011-10-15: 1 via OROMUCOSAL
  Filled 2011-10-15: qty 177

## 2011-10-15 MED ORDER — SWEEN 24 EX CREA: 1.0000 "application " | TOPICAL_CREAM | Freq: Every day | CUTANEOUS | Status: DC | PRN

## 2011-10-15 SURGICAL SUPPLY — 57 items
ADH SKN CLS APL DERMABOND .7 (GAUZE/BANDAGES/DRESSINGS) ×1
BLADE SURG ROTATE 9660 (MISCELLANEOUS) IMPLANT
BUR MATCHSTICK NEURO 3.0 LAGG (BURR) ×2 IMPLANT
BUR ROUND FLUTED 4 SOFT TCH (BURR) IMPLANT
BUR SABER RD CUTTING 3.0 (BURR) IMPLANT
CLOTH BEACON ORANGE TIMEOUT ST (SAFETY) ×2 IMPLANT
COLLAR CERV LO CONTOUR FIRM DE (SOFTGOODS) ×2 IMPLANT
CORDS BIPOLAR (ELECTRODE) ×2 IMPLANT
COVER SURGICAL LIGHT HANDLE (MISCELLANEOUS) ×2 IMPLANT
DERMABOND ADVANCED (GAUZE/BANDAGES/DRESSINGS) ×1
DERMABOND ADVANCED .7 DNX12 (GAUZE/BANDAGES/DRESSINGS) ×1 IMPLANT
DRAPE C-ARM 42X72 X-RAY (DRAPES) ×2 IMPLANT
DRAPE INCISE IOBAN 66X45 STRL (DRAPES) IMPLANT
DRAPE LAPAROTOMY T 102X78X121 (DRAPES) ×2 IMPLANT
DRAPE MICROSCOPE LEICA (MISCELLANEOUS) ×1 IMPLANT
DRAPE PROXIMA HALF (DRAPES) ×3 IMPLANT
DRAPE SURG 17X23 STRL (DRAPES) ×5 IMPLANT
DRSG MEPILEX BORDER 4X4 (GAUZE/BANDAGES/DRESSINGS) ×2 IMPLANT
DRSG MEPILEX BORDER 4X8 (GAUZE/BANDAGES/DRESSINGS) IMPLANT
DURAPREP 26ML APPLICATOR (WOUND CARE) ×2 IMPLANT
ELECT BLADE 4.0 EZ CLEAN MEGAD (MISCELLANEOUS) ×2
ELECT CAUTERY BLADE 6.4 (BLADE) ×2 IMPLANT
ELECT COATED BLADE 2.86 ST (ELECTRODE) ×1 IMPLANT
ELECT REM PT RETURN 9FT ADLT (ELECTROSURGICAL) ×2
ELECTRODE BLDE 4.0 EZ CLN MEGD (MISCELLANEOUS) IMPLANT
ELECTRODE REM PT RTRN 9FT ADLT (ELECTROSURGICAL) ×1 IMPLANT
EVACUATOR 1/8 PVC DRAIN (DRAIN) IMPLANT
GLOVE BIOGEL PI IND STRL 7.5 (GLOVE) ×1 IMPLANT
GLOVE BIOGEL PI INDICATOR 7.5 (GLOVE) ×1
GLOVE ECLIPSE 7.0 STRL STRAW (GLOVE) ×2 IMPLANT
GLOVE ECLIPSE 8.5 STRL (GLOVE) ×2 IMPLANT
GLOVE SURG 8.5 LATEX PF (GLOVE) ×2 IMPLANT
GOWN PREVENTION PLUS LG XLONG (DISPOSABLE) IMPLANT
GOWN PREVENTION PLUS XXLARGE (GOWN DISPOSABLE) ×2 IMPLANT
GOWN STRL NON-REIN LRG LVL3 (GOWN DISPOSABLE) ×4 IMPLANT
KIT BASIN OR (CUSTOM PROCEDURE TRAY) ×2 IMPLANT
KIT ROOM TURNOVER OR (KITS) ×2 IMPLANT
MANIFOLD NEPTUNE II (INSTRUMENTS) ×2 IMPLANT
NS IRRIG 1000ML POUR BTL (IV SOLUTION) ×2 IMPLANT
PACK ORTHO CERVICAL (CUSTOM PROCEDURE TRAY) ×2 IMPLANT
PAD ARMBOARD 7.5X6 YLW CONV (MISCELLANEOUS) ×4 IMPLANT
PATTIES SURGICAL .25X.25 (GAUZE/BANDAGES/DRESSINGS) ×2 IMPLANT
PATTIES SURGICAL .75X.75 (GAUZE/BANDAGES/DRESSINGS) ×2 IMPLANT
SPONGE GAUZE 4X4 12PLY (GAUZE/BANDAGES/DRESSINGS) ×2 IMPLANT
SPONGE SURGIFOAM ABS GEL 100 (HEMOSTASIS) IMPLANT
SUT ETHIBOND CT1 BRD #0 30IN (SUTURE) ×1 IMPLANT
SUT VIC AB 0 CT1 27 (SUTURE) ×2
SUT VIC AB 0 CT1 27XBRD ANBCTR (SUTURE) IMPLANT
SUT VIC AB 2-0 CT1 27 (SUTURE) ×2
SUT VIC AB 2-0 CT1 TAPERPNT 27 (SUTURE) IMPLANT
SUT VIC AB 2-0 UR6 27 (SUTURE) IMPLANT
SUT VICRYL 0 UR6 27IN ABS (SUTURE) ×2 IMPLANT
SUT VICRYL 4-0 PS2 18IN ABS (SUTURE) ×2 IMPLANT
TOWEL OR 17X24 6PK STRL BLUE (TOWEL DISPOSABLE) ×2 IMPLANT
TOWEL OR 17X26 10 PK STRL BLUE (TOWEL DISPOSABLE) ×2 IMPLANT
TRAY FOLEY CATH 14FR (SET/KITS/TRAYS/PACK) ×1 IMPLANT
WATER STERILE IRR 1000ML POUR (IV SOLUTION) ×2 IMPLANT

## 2011-10-15 NOTE — H&P (Signed)
Patient examined and lab reviewed with Vernon PA-C. 

## 2011-10-15 NOTE — H&P (Signed)
Patient was seen and examined in the preop holding area. There has been no interval  Change in this patient's exam preop  history and physical exam  Lab tests and images have been examined and reviewed.  The Risks benefits and alternative treatments have been discussed  extensively,questions answered.  The patient has elected to undergo the discussed surgical treatment. 

## 2011-10-15 NOTE — Transfer of Care (Signed)
Immediate Anesthesia Transfer of Care Note  Patient: Jodi Chavez  Procedure(s) Performed: Procedure(s) (LRB): POSTERIOR CERVICAL FUSION/FORAMINOTOMY LEVEL 2 (N/A)  Patient Location: PACU  Anesthesia Type: General  Level of Consciousness: awake, alert  and oriented  Airway & Oxygen Therapy: Patient Spontanous Breathing and Patient connected to face mask oxygen  Post-op Assessment: Report given to PACU RN, Post -op Vital signs reviewed and stable and Patient moving all extremities X 4  Post vital signs: Reviewed and stable  Complications: No apparent anesthesia complications

## 2011-10-15 NOTE — Anesthesia Preprocedure Evaluation (Addendum)
Anesthesia Evaluation  Patient identified by MRN, date of birth, ID band Patient awake    Reviewed: Allergy & Precautions, H&P , NPO status , Patient's Chart, lab work & pertinent test results  History of Anesthesia Complications (+) PONV  Airway Mallampati: II TM Distance: >3 FB Neck ROM: full    Dental  (+) Teeth Intact and Dental Advisory Given   Pulmonary          Cardiovascular hypertension, Rhythm:regular Rate:Normal     Neuro/Psych PSYCHIATRIC DISORDERS Depression  Neuromuscular disease    GI/Hepatic   Endo/Other  Type 2, Insulin Dependent and Oral Hypoglycemic Agents  Renal/GU      Musculoskeletal   Abdominal   Peds  Hematology   Anesthesia Other Findings   Reproductive/Obstetrics                          Anesthesia Physical Anesthesia Plan  ASA: III  Anesthesia Plan: General   Post-op Pain Management:    Induction: Intravenous  Airway Management Planned: Oral ETT  Additional Equipment:   Intra-op Plan:   Post-operative Plan: Extubation in OR  Informed Consent: I have reviewed the patients History and Physical, chart, labs and discussed the procedure including the risks, benefits and alternatives for the proposed anesthesia with the patient or authorized representative who has indicated his/her understanding and acceptance.     Plan Discussed with: CRNA, Anesthesiologist and Surgeon  Anesthesia Plan Comments:         Anesthesia Quick Evaluation

## 2011-10-15 NOTE — Brief Op Note (Addendum)
10/15/2011  11:48 AM  PATIENT:  Jodi Chavez  61 y.o. 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 Microscope used for this procedure.  SURGEON:  Surgeon(s) and Role:     Kerrin Champagne, MD - Primary  PHYSICIAN ASSISTANT: Skip Mayer PA-C  ANESTHESIA:   local and general, Dr. Diamantina Monks.  EBL:  Total I/O In: 1000 [I.V.:1000] Out: 300 [Urine:300]  BLOOD ADMINISTERED:none  DRAINS: none   LOCAL MEDICATIONS USED:  MARCAINE    and Amount: 10 ml  SPECIMEN:  No Specimen  DISPOSITION OF SPECIMEN:  N/A  COUNTS:  YES  TOURNIQUET:  * No tourniquets in log *  DICTATION: .Dragon Dictation  PLAN OF CARE: Admit to inpatient   PATIENT DISPOSITION:  PACU - hemodynamically stable.   Delay start of Pharmacological VTE agent (>24hrs) due to surgical blood loss or risk of bleeding: yes

## 2011-10-15 NOTE — Anesthesia Postprocedure Evaluation (Signed)
  Anesthesia Post-op Note  Patient: Jodi Chavez  Procedure(s) Performed: Procedure(s) (LRB): POSTERIOR CERVICAL FUSION/FORAMINOTOMY LEVEL 2 (N/A)  Patient Location: PACU  Anesthesia Type: General  Level of Consciousness: awake, oriented, sedated and patient cooperative  Airway and Oxygen Therapy: Patient Spontanous Breathing and Patient connected to nasal cannula oxygen  Post-op Pain: mild  Post-op Assessment: Post-op Vital signs reviewed, Patient's Cardiovascular Status Stable, Respiratory Function Stable, Patent Airway, No signs of Nausea or vomiting and Pain level controlled  Post-op Vital Signs: stable  Complications: No apparent anesthesia complications

## 2011-10-15 NOTE — Op Note (Addendum)
10/15/2011  11:53 AM  PATIENT:  Jodi Chavez  61 y.o. 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  Microscope used for this procedure.     SURGEON:  Kerrin Champagne, MD     ASSISTANT: Skip Mayer, PA-C  (Present throughout the entire procedure     and necessary for completion of procedure in a timely manner)     ANESTHESIA: Local and general, Dr. Diamantina Monks     COMPLICATIONS:  None.      PROCEDURE:The patient was met in the holding area, and the appropriate cervical right C6-7 and C7-T1 levels identified and marked with "x" and my initials. All questions were answered and informed consent signed.   The patient was then transported to OR. The patient was then placed under general anesthesia without difficulty and transferred to the operating room table prone position [place] horseshoe with chest rolls. All pressure points well-padded PAS stockings.. The patient received appropriate preoperative antibiotic prophylaxis.Time-out procedure was called and correct.   Sterile prep with DuraPrep and draped in the usual manner the shoulders were taped downwards and skin traction over the skin of the neck. Following DuraPrep draped in the usual manner. After timeout protocol incision was made approximately T2 to C6 in the midline. This following infiltration of skin and subcutaneous layers with lidocaine 1% with 1-200,000 epinephrine total of 10 cc. Incision carried through skin and subcutaneous layers using 10 blade scalpel and electrocautery down to the level ligamentum nuchae. Incision made centered on the spinous process of T1-C6Clamps then placed at the spinous process of C 6 and C 7  intraoperative C-arm fluoroscopy identified the clamps at the C6-C7 levels. Canning down one further spinous process then a single 0 Vicryl suture was used to mark the spinous process of C 6. Electrocautery then used to carefully incise the cervical muscles off the left lateral aspect of the spinous process of C6, C7 and T1. Carefully removing spinous muscles off of the inferior aspect lamina at C6 exposing the C 6-T 1 posterior aspect of the interlaminar space. The magnification headlamp were used during this portion procedure. Boss McCollough retractor was inserted. High-speed bur was used to remove a small portion of bone from the inferior aspect of lamina of C6 and C7, and the medial 20% of the intra-articular process of C6 and C7. Further thinning the superior aspect of the lamina of C7 and T1. A 1 mm Kerrison was then used to remove ligamentum flavum from superior aspect of the lamina C7 and the medial aspect of the inferior articular process of C6 approximately 20% exposing the superior articular process of C7.  A 1 mm Kerrison was used to remove bone off the superior aspect of the lamina of C7 and then resecting 20% of the medial aspect of the superior articular process of C7. Ligamentum flavum then easily lifted andand electrocautery used to cauterize epidural veins deep to  the ligamentum flavum and the 5 vascular leash overlying the C7 nerve root  was then resected. The operating room microscope was draped sterilely and brought into the field. Under the operating room microscope the epidural vein layer overlying the posterior aspect of the thecal sac and the C7  nerve root was then carefully lifted using a micro-titanium were  cauterized using bipolar electrocautery the 15 blade scalpel then used to incise this overlying the C7  nerve root releasing the vascular leash a forward angle 3-0 microcurette then used to remove a small portion of bone off the superior and medial aspect of the pedicle further  mobilizing the C 7 nerve root bipolar electrocautery to control all bleeding within the axillary area and C7  nerve. Bone wax was applied to bleeding cancellus bone surfaces are excellent hemostasis obtained for C7  nerve root was tracked superiorly and laterally nerve hook. The disc explored using a Penfield 4 found to be protruded. 11 blade scalpel used to incise the disc and  but no disc material extruded  and the obvious posterior mass effect was felt to represent uncovertebral spur. Following this then hemostasis was obtained using thrombin-soaked Gelfoam and micro-pledgettes. When complete hemostasis was obtained all trial was removed I nerve hook could be easily passed out the neuroforamen without the lateral aspect of the C7  pedicle demonstrate the C7  neuroforamen completely decompressed. A 1 mm Kerrison was then used to remove ligamentum flavum from superior aspect of the lamina T1 and the medial aspect of the inferior articular process of C 7 approximately 20% exposing the superior articular process of T1.  A 1 mm Kerrison was used to remove bone off the superior aspect of the lamina of T1 and then resecting 20% of the medial aspect of the superior articular process of T1. Ligamentum flavum then easily lifted andand electrocautery used to cauterize epidural veins deep to  the ligamentum flavum and the 5 vascular leash overlying the C8 nerve root  was then resected. The operating room microscope was draped sterilely and brought into the field. Under the operating room microscope the epidural vein layer overlying the posterior aspect of the thecal sac and the C8  nerve root was then carefully lifted using a micro-titanium were cauterized using bipolar electrocautery the 15 blade scalpel then used to incise this overlying the C8  nerve root releasing the vascular leash a forward angle 3-0 microcurette then used to remove a small portion of bone off the superior and medial aspect of the pedicle further  mobilizing the C8 nerve root bipolar electrocautery to control all bleeding within the axillary area and C8  nerve. Bone wax was applied to bleeding cancellus bone surfaces are excellent hemostasis obtained for C8  nerve root was tracked superiorly and laterally nerve hook. The obvious posterior mass effect was felt to represent uncovertebral spur. Following this then hemostasis was obtained using thrombin-soaked Gelfoam and micro-pledgettes. When complete hemostasis was obtained all trial was removed I nerve hook could be easily passed out the neuroforamen without the lateral aspect of the T1  pedicle demonstrate the C8  neuroforamen completely decompressed. Irrigation was carried out no active bleeding was present. Following further irrigation and the incision was closed by approximating the ligamentum nuchae with 0 Ethibond sutures. The subcutaneous layers approximated with interrupted 0 Vicryl suture more superficial layers with interrupted 2-0 Vicryl sutures and the skin closed with interrupted 4-0 Vicryl sutures. Dermabond was applied then [employer] bandage. Soft cervical collar all instrument and sponge counts were correct. Patient was then returned to supine position on her stretcher. Returned to recovery room in satisfactory condition.    Physician assistant's responsibilities: Skip Mayer PA-C perform the duties of assistant physician and surgeon during this case present from the beginning of the case to the end of the case. She assisted with careful retraction of neural  structures suctioning about her elements including cervical cord and CC7 and C8  nerve roots. Performed closure of the incision on the ligamentum nuchae to the skin and application of dressing. She assisted in positioning the patient had removal the patient from the OR table to her stretcher.     Lydiana Milley E 10/15/2011, 11:53 AM

## 2011-10-15 NOTE — Progress Notes (Signed)
UR COMPLETED  

## 2011-10-16 ENCOUNTER — Encounter (HOSPITAL_COMMUNITY): Payer: Self-pay | Admitting: Specialist

## 2011-10-16 LAB — GLUCOSE, CAPILLARY
Glucose-Capillary: 128 mg/dL — ABNORMAL HIGH (ref 70–99)
Glucose-Capillary: 152 mg/dL — ABNORMAL HIGH (ref 70–99)

## 2011-10-16 MED ORDER — METOCLOPRAMIDE HCL 10 MG PO TABS
5.0000 mg | ORAL_TABLET | Freq: Three times a day (TID) | ORAL | Status: DC | PRN
  Administered 2011-10-17 – 2011-10-18 (×2): 10 mg via ORAL
  Filled 2011-10-16: qty 1
  Filled 2011-10-16: qty 2

## 2011-10-16 MED ORDER — METOCLOPRAMIDE HCL 5 MG/ML IJ SOLN: 5.0000 mg | Freq: Three times a day (TID) | INTRAMUSCULAR | Status: DC

## 2011-10-16 MED ORDER — METOCLOPRAMIDE HCL 5 MG/ML IJ SOLN
5.0000 mg | Freq: Three times a day (TID) | INTRAMUSCULAR | Status: DC | PRN
  Administered 2011-10-16 – 2011-10-19 (×5): 10 mg via INTRAVENOUS
  Filled 2011-10-16 (×2): qty 2
  Filled 2011-10-16: qty 4
  Filled 2011-10-16 (×2): qty 2

## 2011-10-16 NOTE — Progress Notes (Signed)
Pt refused to let staff take out f/c stating "I just hurt so bad", nurse talked to pt regarding protocol and MD orders.  Pt said she would let staff take foley out before PT session.  PRN Dilaudid 1mg  IV given for pain, med effective.

## 2011-10-16 NOTE — Progress Notes (Signed)
Subjective: Complains of pain level 8/10, pain with rolling in bed. Sister notes that she needs to use her arm to move about Due to hip problems. Foley still in place this am apparently the patient did not want it discontinued.  Objective: Vital signs in last 24 hours: Temp:  [97.2 F (36.2 C)-98.7 F (37.1 C)] 98.2 F (36.8 C) (08/20 0602) Pulse Rate:  [69-101] 89  (08/20 0602) Resp:  [13-22] 16  (08/20 0602) BP: (143-166)/(60-84) 164/80 mmHg (08/20 0602) SpO2:  [90 %-99 %] 95 % (08/20 0602) FiO2 (%):  [2 %] 2 % (08/20 0602)  Intake/Output from previous day: 08/19 0701 - 08/20 0700 In: 2530 [P.O.:230; I.V.:2300] Out: 2110 [Urine:2010; Blood:100] Intake/Output this shift:    No results found for this basename: HGB:5 in the last 72 hours No results found for this basename: WBC:2,RBC:2,HCT:2,PLT:2 in the last 72 hours No results found for this basename: NA:2,K:2,CL:2,CO2:2,BUN:2,CREATININE:2,GLUCOSE:2,CALCIUM:2 in the last 72 hours No results found for this basename: LABPT:2,INR:2 in the last 72 hours  Neurologically intact ABD soft Incision: no drainage No cellulitis present Tender about the cervical incision site and in the areas of paracervical muscle attachments. Assessment/Plan: POD#1 right 2 level foraminotomy right C6-7 and right C7-T1. Limited by her hip arthrosis and the need to use arms to assist her in transfers. Refusing the removal of her foley.  Plan: D/C foley to hopefully induce patient to start moving. PT consult and OT consult. Will assess her mobility and encourage her to move arms and legs. She has some mental retardation that limits her ability to understand fully Directions.   Alexius Ellington E 10/16/2011, 9:54 AM

## 2011-10-16 NOTE — Progress Notes (Signed)
Occupational Therapy Treatment Patient Details Name: Jodi Chavez MRN: [id number] DOB: 09-09-1950 Today's Date: 10/16/2011 Time: 1308-6578 OT Time Calculation (min): 30 min  OT Assessment / Plan / Recommendation Comments on Treatment Session Improved performance 2nd session. Pt more confident with movement, however, pt continues to c/o feelings of "heaviness" in BU & LE. Sister present for sessin.    Follow Up Recommendations  Home health OT (pending progress)    Barriers to Discharge  None    Equipment Recommendations  Rolling walker with 5" wheels    Recommendations for Other Services    Frequency Min 2X/week   Plan Discharge plan remains appropriate    Precautions / Restrictions Precautions Precautions: Cervical Required Braces or Orthoses: Cervical Brace Cervical Brace: Soft collar   Pertinent Vitals/Pain 4    ADL  Eating/Feeding: Simulated;Supervision/safety Grooming: Simulated;Minimal assistance Where Assessed - Grooming: Supported sitting Upper Body Bathing: Simulated;Minimal assistance Where Assessed - Upper Body Bathing: Supported sitting Lower Body Bathing: Simulated;Maximal assistance Where Assessed - Lower Body Bathing: Supported sit to stand Upper Body Dressing: Simulated;Moderate assistance Where Assessed - Upper Body Dressing: Supported sitting Lower Body Dressing: Simulated;Maximal assistance Where Assessed - Lower Body Dressing: Supported sit to Pharmacist, hospital: Performed;Minimal Dentist Method: Sit to Barista: Bedside commode Toileting - Clothing Manipulation and Hygiene: Performed;Minimal assistance Where Assessed - Engineer, mining and Hygiene: Standing;Sit to stand from 3-in-1 or toilet Equipment Used: Gait belt Transfers/Ambulation Related to ADLs: Min A 2 nd session. Improved balance ADL Comments: Focus on mobility during toileting and dressing. Pt able to complete hygiene after  toileting. Unable tot complete LB B/D and requires repetition to comprehend cervical precautions. Written handout given.    OT Diagnosis: Generalized weakness;Acute pain  OT Problem List: Decreased strength;Decreased range of motion;Decreased activity tolerance;Decreased safety awareness;Decreased knowledge of use of DME or AE;Decreased knowledge of precautions;Impaired sensation;Obesity;Pain OT Treatment Interventions: Self-care/ADL training;Energy conservation;DME and/or AE instruction;Therapeutic activities;Patient/family education   OT Goals Acute Rehab OT Goals OT Goal Formulation: With patient Time For Goal Achievement: 10/23/11 Potential to Achieve Goals: Good ADL Goals Pt Will Perform Grooming: with supervision;Supported;with cueing (comment type and amount);Standing at sink ADL Goal: Grooming - Progress: Progressing toward goals Pt Will Perform Upper Body Bathing: with set-up;with supervision;with caregiver independent in assisting;Unsupported;Sitting at sink ADL Goal: Upper Body Bathing - Progress: Progressing toward goals Pt Will Perform Lower Body Bathing: with supervision;with caregiver independent in assisting;Sit to stand from chair;with adaptive equipment;Unsupported ADL Goal: Lower Body Bathing - Progress: Progressing toward goals Pt Will Perform Upper Body Dressing: with set-up;with caregiver independent in assisting;Unsupported;with cueing (comment type and amount) ADL Goal: Upper Body Dressing - Progress: Progressing toward goals Pt Will Perform Lower Body Dressing: with supervision;with caregiver independent in assisting;Sit to stand from chair;Unsupported;with adaptive equipment;with cueing (comment type and amount) ADL Goal: Lower Body Dressing - Progress: Progressing toward goals Pt Will Transfer to Toilet: with modified independence;3-in-1 ADL Goal: Toilet Transfer - Progress: Progressing toward goals Pt Will Perform Toileting - Clothing Manipulation: with modified  independence;Standing ADL Goal: Toileting - Clothing Manipulation - Progress: Progressing toward goals Pt Will Perform Toileting - Hygiene: with modified independence;Sitting on 3-in-1 or toilet ADL Goal: Toileting - Hygiene - Progress: Progressing toward goals Pt Will Perform Tub/Shower Transfer: Shower transfer;with supervision;with caregiver independent in assisting;with DME ADL Goal: Tub/Shower Transfer - Progress: Goal set today  Visit Information  Last OT Received On: 10/16/11    Subjective Data      Prior Functioning  Home Living Lives With: Family Available Help at Discharge: Available 24 hours/day Type of Home: House Home Access: Stairs to enter [employer] of Steps: 4 Home Layout: One level Bathroom Shower/Tub: Walk-in shower;Door Bathroom Toilet: Handicapped height Bathroom Accessibility: Yes Home Adaptive Equipment: Straight cane;Bedside commode/3-in-1;Wheelchair - manual;Walker - rolling Prior Function Level of Independence: Independent with assistive device(s) (uses cane) Able to Take Stairs?: Yes Driving: Yes Vocation: On disability Communication Communication: No difficulties Dominant Hand: Right    Cognition  Overall Cognitive Status: History of cognitive impairments - at baseline Arousal/Alertness: Awake/alert Orientation Level: Appears intact for tasks assessed Behavior During Session: [facility] for tasks performed    Mobility Bed Mobility Bed Mobility: Rolling Right;Right Sidelying to Sit;Sitting - Scoot to Edge of Bed Rolling Right: 3: Mod assist Right Sidelying to Sit: 3: Mod assist Sitting - Scoot to Edge of Bed: 4: Min assist Details for Bed Mobility Assistance: manual assist for roll and sidelying to sit Transfers Transfers: Sit to Stand;Stand to Sit Sit to Stand: 4: Min assist;From toilet;With upper extremity assist Stand to Sit: 4: Min assist;To chair/3-in-1;With upper extremity assist Details for Transfer Assistance: vc for technique.  Pt c/o "dead weight"   Exercises    Balance Balance Balance Assessed:  (min guarsd)  End of Session OT - End of Session Equipment Utilized During Treatment: Cervical collar;Gait belt Activity Tolerance: Patient limited by pain;Patient limited by fatigue Patient left: in chair;with call bell/phone within reach;with family/visitor present Nurse Communication: Other (comment) (need to mobilize)  GO     [facility] 10/16/2011, 3:38 PM [facility], OTR/L  [phone] 10/16/2011

## 2011-10-16 NOTE — Progress Notes (Addendum)
Occupational Therapy Evaluation Patient Details Name: Jodi Chavez MRN: [id number] DOB: 27-May-1950 Today's Date: 10/16/2011 Time: 4098-1191 OT Time Calculation (min): 30 min  OT Assessment / Plan / Recommendation Clinical Impression  61 yo s/p cervical fusion. Pt will benefit from skilled Ot services to max independence with ADL and functional moblity for ADL to facilitate D/C home with sister. PTA, pt lived alone, but plans to recover at  her sister's house. Pt is not ready to D/C home today, but will most likely be able to D/C home tomorrow after OT/PT sessions.     OT Assessment  Patient needs continued OT Services    Follow Up Recommendations  Home health OT - pending progress    Barriers to Discharge None    Equipment Recommendations  None recommended by OT    Recommendations for Other Services    Frequency  Min 2X/week    Precautions / Restrictions Precautions Precautions: Cervical Required Braces or Orthoses: Cervical Brace Cervical Brace: Soft collar   Pertinent Vitals/Pain 5    ADL  Eating/Feeding: Simulated;Supervision/safety Grooming: Simulated;Minimal assistance Where Assessed - Grooming: Supported sitting Upper Body Bathing: Simulated;Minimal assistance Where Assessed - Upper Body Bathing: Supported sitting Lower Body Bathing: Simulated;Maximal assistance Where Assessed - Lower Body Bathing: Supported sit to stand Upper Body Dressing: Simulated;Moderate assistance Where Assessed - Upper Body Dressing: Supported sitting Lower Body Dressing: Simulated;Maximal assistance Where Assessed - Lower Body Dressing: Supported sit to Pharmacist, hospital: Performed;Minimal Dentist Method: Sit to Barista: Bedside commode Toileting - Clothing Manipulation and Hygiene: Performed;Minimal assistance Where Assessed - Engineer, mining and Hygiene: Standing;Sit to stand from 3-in-1 or toilet Equipment Used: Gait  belt Transfers/Ambulation Related to ADLs: Mod A for transfers and ambulation. Pt limited by c/o heaviness in legs and c/o nausea ADL Comments: will benfit from AE    OT Diagnosis: Generalized weakness;Acute pain  OT Problem List: Decreased strength;Decreased range of motion;Decreased activity tolerance;Decreased safety awareness;Decreased knowledge of use of DME or AE;Decreased knowledge of precautions;Impaired sensation;Obesity;Pain OT Treatment Interventions: Self-care/ADL training;Energy conservation;DME and/or AE instruction;Therapeutic activities;Patient/family education   OT Goals Acute Rehab OT Goals OT Goal Formulation: With patient Time For Goal Achievement: 10/23/11 Potential to Achieve Goals: Good ADL Goals Pt Will Perform Grooming: with supervision;Supported;with cueing (comment type and amount);Standing at sink ADL Goal: Grooming - Progress: Goal set today Pt Will Perform Upper Body Bathing: with set-up;with supervision;with caregiver independent in assisting;Unsupported;Sitting at sink ADL Goal: Upper Body Bathing - Progress: Goal set today Pt Will Perform Lower Body Bathing: with supervision;with caregiver independent in assisting;Sit to stand from chair;with adaptive equipment;Unsupported ADL Goal: Lower Body Bathing - Progress: Goal set today Pt Will Perform Upper Body Dressing: with set-up;with caregiver independent in assisting;Unsupported;with cueing (comment type and amount) ADL Goal: Upper Body Dressing - Progress: Goal set today Pt Will Perform Lower Body Dressing: with supervision;with caregiver independent in assisting;Sit to stand from chair;Unsupported;with adaptive equipment;with cueing (comment type and amount) ADL Goal: Lower Body Dressing - Progress: Goal set today Pt Will Transfer to Toilet: with modified independence;3-in-1 ADL Goal: Toilet Transfer - Progress: Goal set today Pt Will Perform Toileting - Clothing Manipulation: with modified  independence;Standing ADL Goal: Toileting - Clothing Manipulation - Progress: Goal set today Pt Will Perform Toileting - Hygiene: with modified independence;Sitting on 3-in-1 or toilet ADL Goal: Toileting - Hygiene - Progress: Goal set today Pt Will Perform Tub/Shower Transfer: Shower transfer;with supervision;with caregiver independent in assisting;with DME ADL Goal: Tub/Shower Transfer - Progress:  Goal set today  Visit Information  Last OT Received On: 10/16/11    Subjective Data   I don't know what they did to my neck   Prior Functioning  Vision/Perception  Home Living Lives With: Family Available Help at Discharge: Available 24 hours/day Type of Home: House Home Access: Stairs to enter [employer] of Steps: 4 Home Layout: One level Bathroom Shower/Tub: Walk-in shower;Door Bathroom Toilet: Handicapped height Bathroom Accessibility: Yes Home Adaptive Equipment: Straight cane;Bedside commode/3-in-1;Wheelchair - manual;Walker - rolling Prior Function Level of Independence: Independent with assistive device(s) (uses cane) Able to Take Stairs?: Yes Driving: Yes Vocation: On disability Communication Communication: No difficulties Dominant Hand: Right      Cognition  Overall Cognitive Status: History of cognitive impairments - at baseline Arousal/Alertness: Awake/alert Orientation Level: Appears intact for tasks assessed Behavior During Session: [facility] for tasks performed    Extremity/Trunk Assessment Right Upper Extremity Assessment RUE ROM/Strength/Tone: Deficits (c/o arm feeling "heavy") RUE ROM/Strength/Tone Deficits: Able to bring shoulder to @75  FF, but then poor control to lower RUE Sensation: Deficits (c/o numbness) RUE Sensation Deficits: c/o numb feeling in complete arm RUE Coordination: WFL - gross/fine motor Left Upper Extremity Assessment LUE ROM/Strength/Tone: Deficits (arm feels like "dead weight") LUE ROM/Strength/Tone Deficits: Strength not  formally tested.  LUE Sensation: Deficits (c/o pridkly feeling) LUE Coordination: WFL - gross/fine motor Right Lower Extremity Assessment RLE ROM/Strength/Tone: WFL for tasks assessed RLE Sensation: History of peripheral neuropathy Left Lower Extremity Assessment LLE ROM/Strength/Tone: WFL for tasks assessed LLE Sensation: History of peripheral neuropathy Trunk Assessment Trunk Assessment: Normal   Mobility Bed Mobility Bed Mobility: Rolling Right;Right Sidelying to Sit;Sitting - Scoot to Edge of Bed Rolling Right: 3: Mod assist Right Sidelying to Sit: 3: Mod assist Sitting - Scoot to Edge of Bed: 4: Min assist Details for Bed Mobility Assistance: manual assist for roll and sidelying to sit Transfers Transfers: Sit to Stand;Stand to Sit Sit to Stand: 3: Mod assist Stand to Sit: 3: Mod assist Details for Transfer Assistance: vc for technique. Pt c/o "dead weight"   Exercise    Balance Balance Balance Assessed:  (min guarsd)  End of Session OT - End of Session Equipment Utilized During Treatment: Cervical collar;Gait belt Activity Tolerance: Patient limited by pain;Patient limited by fatigue Nurse Communication: Patient requests pain meds  GO     Phillip Sandler,HILLARY 10/16/2011, 3:28 PM [facility], OTR/L  [phone] 10/16/2011

## 2011-10-17 DIAGNOSIS — R339 Retention of urine, unspecified: Secondary | ICD-10-CM

## 2011-10-17 LAB — GLUCOSE, CAPILLARY: Glucose-Capillary: 110 mg/dL — ABNORMAL HIGH (ref 70–99)

## 2011-10-17 NOTE — Evaluation (Signed)
Physical Therapy Evaluation Patient Details Name: Jodi Chavez MRN: [id number] DOB: 1950/09/13 Today's Date: 10/17/2011 Time: 4098-1191 PT Time Calculation (min): 19 min  PT Assessment / Plan / Recommendation Clinical Impression       PT Assessment  Patient needs continued PT services    Follow Up Recommendations  Skilled nursing facility;Supervision/Assistance - 24 hour    Barriers to Discharge Decreased caregiver support      Equipment Recommendations  Rolling walker with 5" wheels    Recommendations for Other Services     Frequency Min 5X/week    Precautions / Restrictions Precautions Precautions: Cervical Required Braces or Orthoses: Cervical Brace Cervical Brace: Soft collar   Pertinent Vitals/Pain Pt c/o neck pain 6-7/10 pt premedicated prior to session.       Mobility  Bed Mobility Bed Mobility: Rolling Right;Right Sidelying to Sit;Sitting - Scoot to [place] of Bed;Sit to Sidelying Right Rolling Right: 3: Mod assist Right Sidelying to Sit: 3: Mod assist Sitting - Scoot to Edge of Bed: 4: Min assist Sit to Sidelying Right: 4: Min assist Details for Bed Mobility Assistance: Pt required use of rail.  Cueing for technique to maintain cervical precautions. Assist to raise shoulders secondary to pain and weakness in UEs.  Transfers Transfers: Sit to Stand;Stand to Sit Sit to Stand: 4: Min assist;From toilet;With upper extremity assist Stand to Sit: 4: Min assist;To chair/3-in-1;With upper extremity assist Details for Transfer Assistance: Assist to steady pt. Ambulation/Gait Ambulation/Gait Assistance: 4: Min assist Assistive device: Rolling walker Gait Pattern: Decreased stride length;Narrow base of support Gait velocity: < 1 ft/sec Stairs: No Wheelchair Mobility Wheelchair Mobility: No    Exercises     PT Diagnosis: Difficulty walking;Generalized weakness;Acute pain  PT Problem List: Decreased strength;Decreased activity tolerance;Decreased balance;Decreased  mobility;Decreased coordination;Decreased knowledge of use of DME;Decreased knowledge of precautions;Pain;Decreased range of motion PT Treatment Interventions: DME instruction;Gait training;Stair training;Functional mobility training;Therapeutic activities;Neuromuscular re-education;Patient/family education   PT Goals Acute Rehab PT Goals PT Goal Formulation: With patient Time For Goal Achievement: 10/24/11 Potential to Achieve Goals: Good Pt will Roll Supine to Right Side: with modified independence PT Goal: Rolling Supine to Right Side - Progress: Goal set today Pt will go Supine/Side to Sit: with modified independence PT Goal: Supine/Side to Sit - Progress: Goal set today Pt will go Sit to Supine/Side: with modified independence PT Goal: Sit to Supine/Side - Progress: Goal set today Pt will go Sit to Stand: with modified independence PT Goal: Sit to Stand - Progress: Goal set today Pt will go Stand to Sit: with modified independence PT Goal: Stand to Sit - Progress: Goal set today Pt will Ambulate: 51 - 150 feet;with modified independence;with least restrictive assistive device PT Goal: Ambulate - Progress: Goal set today Pt will Go Up / Down Stairs: 1-2 stairs;with modified independence;with least restrictive assistive device PT Goal: Up/Down Stairs - Progress: Goal set today Additional Goals Additional Goal #1: Pt will verbalize and demonstrate 4/4 cervical precautions.  PT Goal: Additional Goal #1 - Progress: Goal set today  Visit Information  Last PT Received On: 10/17/11    Subjective Data  Subjective: Agree to PT eval  Patient Stated Goal: Return to home when I am ready   Prior Functioning  Home Living Lives With: Family Available Help at Discharge: Available 24 hours/day Type of Home: House Home Access: Stairs to enter [employer] of Steps: 4 Home Layout: One level Bathroom Shower/Tub: Walk-in shower;Door [employer] Toilet: Handicapped height Bathroom  Accessibility: Yes Home Adaptive Equipment: Straight  cane;Bedside commode/3-in-1;Wheelchair - manual;Walker - rolling Prior Function Level of Independence: Independent with assistive device(s) (uses cane) Able to Take Stairs?: Yes Driving: Yes Vocation: On disability Communication Communication: No difficulties Dominant Hand: Right    Cognition  Overall Cognitive Status: History of cognitive impairments - at baseline Arousal/Alertness: Awake/alert Orientation Level: Appears intact for tasks assessed Behavior During Session: [facility] for tasks performed    Extremity/Trunk Assessment Right Upper Extremity Assessment RUE ROM/Strength/Tone: Deficits RUE ROM/Strength/Tone Deficits: Generalized weakness in shoulders and hands RUE Sensation: Deficits RUE Sensation Deficits: c/o numb feeling in complete arm RUE Coordination: WFL - gross/fine motor Left Upper Extremity Assessment LUE ROM/Strength/Tone: Deficits LUE ROM/Strength/Tone Deficits: Generalize weakness in shoulder flexion 4/5  LUE Sensation: Deficits LUE Sensation Deficits: C/o pins and needles in hands.  Right Lower Extremity Assessment RLE ROM/Strength/Tone: WFL for tasks assessed RLE Sensation: History of peripheral neuropathy Left Lower Extremity Assessment LLE ROM/Strength/Tone: WFL for tasks assessed LLE Sensation: History of peripheral neuropathy Trunk Assessment Trunk Assessment: Normal   Balance Balance Balance Assessed: Yes Static Standing Balance Static Standing - Balance Support: No upper extremity supported Static Standing - Level of Assistance: 5: Stand by assistance Static Standing - Comment/# of Minutes: < 30 seconds pt with increased A-P sway. Pt stating I need the walker.    End of Session PT - End of Session Equipment Utilized During Treatment: Gait belt;Cervical collar Activity Tolerance: Patient tolerated treatment well Patient left: in bed;with call bell/phone within reach Nurse Communication: Mobility  status;Precautions  GP     Jodi Chavez 10/17/2011, 6:20 PM  Jodi Chavez DPT [phone]

## 2011-10-17 NOTE — Progress Notes (Signed)
Subjective: 2 Days Post-Op Procedure(s) (LRB): POSTERIOR CERVICAL FUSION/FORAMINOTOMY LEVEL 2 (N/A) Slow with therapy. Sister is concerned that she may not be able to care for her fully at home due to increased dependency. Both her sister and brother in law are Elderly and can not physically assist Jodi Chavez in transfers. She expressed her thoughts that she needs to consider SNF For recuperation.  Patient reports pain as moderate.    Objective:   VITALS:  Temp:  [98.3 F (36.8 C)-99.1 F (37.3 C)] 98.3 F (36.8 C) (08/21 0607) Pulse Rate:  [70-105] 70  (08/21 0607) Resp:  [18-20] 20  (08/21 0607) BP: (115-161)/(48-83) 115/48 mmHg (08/21 0607) SpO2:  [93 %-95 %] 95 % (08/21 0607) FiO2 (%):  [2 %] 2 % (08/20 2206)  Neurologically intact ABD soft Sensation intact distally Intact pulses distally Dorsiflexion/Plantar flexion intact Incision: no drainage No cellulitis present   LABS No results found for this basename: HGB:5,WBC:2,PLT:2 in the last 72 hours No results found for this basename: NA:2,K:2,CL:2,CO2:2,BUN:2,CREATININE:2,GLUCOSE:2, in the last 72 hours No results found for this basename: LABPT:2,INR:2 in the last 72 hours   Assessment/Plan: 2 Days Post-Op Procedure(s) (LRB): POSTERIOR CERVICAL FUSION/FORAMINOTOMY LEVEL 2 (N/A) Urinary retention  Advance diet Up with therapy Discharge to SNF Obtain consult with social service to consider  SNF for recuperation post op cervical spine foraminotomies. Cipriano Millikan E 10/17/2011, 1:16 PM

## 2011-10-17 NOTE — Progress Notes (Signed)
Clinical Social Work Department BRIEF PSYCHOSOCIAL ASSESSMENT 10/17/2011  Patient:  Jodi Chavez, Jodi Chavez     Account Number:  [id number]     Admit date:  10/15/2011  Clinical Social Worker:  Dennison Bulla  Date/Time:  10/17/2011 04:30 PM  Referred by:  Physician  Date Referred:  10/17/2011 Referred for  SNF Placement   Other Referral:   Interview type:  Patient Other interview type:    PSYCHOSOCIAL DATA Living Status:  FAMILY Admitted from facility:   Level of care:   Primary support name:  Liborio Nixon Primary support relationship to patient:  SIBLING Degree of support available:   Strong    CURRENT CONCERNS Current Concerns  Post-Acute Placement   Other Concerns:    SOCIAL WORK ASSESSMENT / PLAN CSW received referral to assist with SNF placement. CSW reviewed chart and met with patient at bedside. No visitors were present.    CSW introduced myself and explained role. Patient reports that she lives alone but has been recently staying with her sister. Patient reports she would be unable to return with sister in this condition and would need SNF before dc. CSW provided patient with list and explained Medicare benefits. Patient was agreeable to search in [place] and [place]. Patient reports that sister is at a dr appt but reports that she will inform sister of plans.    CSW completed FL2 and completed bed search. CSW will follow up with bed offers.   Assessment/plan status:  Psychosocial Support/Ongoing Assessment of Needs Other assessment/ plan:   Information/referral to community resources:   SNF list    PATIENT'S/FAMILY'S RESPONSE TO PLAN OF CARE: Patient was alert and oriented. Patient agreeable to SNF. Patient engaged throughout assessment.        Coverage for [name]

## 2011-10-17 NOTE — Progress Notes (Addendum)
Clinical Social Work Department CLINICAL SOCIAL WORK PLACEMENT NOTE 10/17/2011  Patient:  Jodi Chavez, Jodi Chavez  Account Number:  [id number] Admit date:  10/15/2011  Clinical Social Worker:  Unk Lightning, LCSW  Date/time:  10/17/2011 04:30 PM  Clinical Social Work is seeking post-discharge placement for this patient at the following level of care:   SKILLED NURSING   (*CSW will update this form in Epic as items are completed)   10/17/2011  Patient/family provided with [facility] Department of Clinical Social Work's list of facilities offering this level of care within the geographic area requested by the patient (or if unable, by the patient's family).  10/17/2011  Patient/family informed of their freedom to choose among providers that offer the needed level of care, that participate in Medicare, Medicaid or managed care program needed by the patient, have an available bed and are willing to accept the patient.  10/17/2011  Patient/family informed of [facility]' ownership interest in [facility], as well as of the fact that they are under no obligation to receive care at this facility.  PASARR submitted to EDS on 10/17/2011 PASARR number received from EDS on 10/17/2011  FL2 transmitted to all facilities in geographic area requested by pt/family on  10/17/2011 FL2 transmitted to all facilities within larger geographic area on   Patient informed that his/her managed care company has contracts with or will negotiate with  certain facilities, including the following:     Patient/family informed of bed offers received:  10/18/11  (DTC) Patient chooses bed at [facility] Physician recommends and patient chooses bed at    Patient to be transferred to [facility] on  10/19/11 Patient to be transferred to facility by [facility]  The following physician request were entered in Epic:   Additional Comments:

## 2011-10-17 NOTE — Progress Notes (Signed)
Occupational Therapy Treatment Patient Details Name: Jodi Chavez MRN: [id number] DOB: April 26, 1950 Today's Date: 10/17/2011 Time: 4098-1191 OT Time Calculation (min): 26 min  OT Assessment / Plan / Recommendation Comments on Treatment Session Improved independence with AE. Pt anxious about going home and pt's sister states that she can not care for her at this level. Pt will need ST SNF for rehab before returning home.    Follow Up Recommendations  Skilled nursing facility    Barriers to Discharge       Equipment Recommendations  Rolling walker with 5" wheels    Recommendations for Other Services    Frequency Min 2X/week   Plan Discharge plan needs to be updated    Precautions / Restrictions Precautions Precautions: Cervical Required Braces or Orthoses: Cervical Brace Cervical Brace: Soft collar   Pertinent Vitals/Pain 6    ADL  Upper Body Bathing: Performed;Set up Where Assessed - Upper Body Bathing: Supported sitting Lower Body Bathing: Performed;Supervision/safety;Set up Where Assessed - Lower Body Bathing: Supported sit to stand Upper Body Dressing: Performed;Set up Where Assessed - Upper Body Dressing: Supported sitting Lower Body Dressing: Performed;Supervision/safety;Set up Where Assessed - Lower Body Dressing: Unsupported sit to stand Toilet Transfer: Performed;Supervision/safety Toilet Transfer Method: Sit to Barista: Materials engineer and Hygiene: Performed;Supervision/safety Where Assessed - Engineer, mining and Hygiene: Standing Equipment Used: Gait belt;Long-handled sponge;Reacher;Rolling walker;Sock aid Transfers/Ambulation Related to ADLs: S ADL Comments: supervision for use of AE    OT Diagnosis:    OT Problem List:   OT Treatment Interventions:     OT Goals Acute Rehab OT Goals OT Goal Formulation: With patient Time For Goal Achievement: 10/24/11 Potential to Achieve Goals:  Good ADL Goals Pt Will Perform Grooming: with supervision;Supported;with cueing (comment type and amount);Standing at sink ADL Goal: Grooming - Progress: Progressing toward goals Pt Will Perform Upper Body Bathing: with set-up;with supervision;with caregiver independent in assisting;Unsupported;Sitting at sink ADL Goal: Upper Body Bathing - Progress: Met Pt Will Perform Lower Body Bathing: with supervision;with caregiver independent in assisting;Sit to stand from chair;with adaptive equipment;Unsupported ADL Goal: Lower Body Bathing - Progress: Met Pt Will Perform Upper Body Dressing: with set-up;with caregiver independent in assisting;Unsupported;with cueing (comment type and amount) ADL Goal: Upper Body Dressing - Progress: Met Pt Will Perform Lower Body Dressing: with supervision;with caregiver independent in assisting;Sit to stand from chair;Unsupported;with adaptive equipment;with cueing (comment type and amount) ADL Goal: Lower Body Dressing - Progress: Met Pt Will Transfer to Toilet: with modified independence;3-in-1 ADL Goal: Toilet Transfer - Progress: Progressing toward goals Pt Will Perform Toileting - Clothing Manipulation: with modified independence;Standing ADL Goal: Toileting - Clothing Manipulation - Progress: Progressing toward goals Pt Will Perform Toileting - Hygiene: with modified independence;Sitting on 3-in-1 or toilet ADL Goal: Toileting - Hygiene - Progress: Progressing toward goals Pt Will Perform Tub/Shower Transfer: Shower transfer;with supervision;with caregiver independent in assisting;with DME  Visit Information  Last OT Received On: 10/17/11    Subjective Data      Prior Functioning       Cognition  Overall Cognitive Status: History of cognitive impairments - at baseline    Mobility Transfers Transfers: Sit to Stand;Stand to Sit Sit to Stand: 5: Supervision;With upper extremity assist;From chair/3-in-1 Stand to Sit: 5: Supervision;With upper extremity  assist;To chair/3-in-1;To toilet   Exercises    Balance    End of Session OT - End of Session Equipment Utilized During Treatment: Gait belt Activity Tolerance: Patient tolerated treatment well Patient left:  with call bell/phone within reach;Other (comment) (toilet) Nurse Communication: Mobility status  GO     Reubin Bushnell,HILLARY 10/17/2011, 5:22 PM [facility], OTR/L  [phone] 10/17/2011

## 2011-10-18 ENCOUNTER — Encounter (HOSPITAL_COMMUNITY): Payer: Self-pay

## 2011-10-18 LAB — GLUCOSE, CAPILLARY
Glucose-Capillary: 137 mg/dL — ABNORMAL HIGH (ref 70–99)
Glucose-Capillary: 101 mg/dL — ABNORMAL HIGH (ref 70–99)
Glucose-Capillary: 102 mg/dL — ABNORMAL HIGH (ref 70–99)
Glucose-Capillary: 97 mg/dL (ref 70–99)

## 2011-10-18 MED ORDER — OXYCODONE HCL 5 MG PO TABS: 10.0000 mg | ORAL_TABLET | ORAL | Status: DC | PRN

## 2011-10-18 NOTE — Progress Notes (Signed)
Patient examined and lab reviewed with Vernon PA-C. 

## 2011-10-18 NOTE — Progress Notes (Signed)
Met with patient- she was given bed offers from [place]. She states she will talk to her sister tonight to determine choice.  Patient is concerned about being discharged stating "they won't discharge me anytime soon will they?"  Discussed medical stability and SNF services and stated that MD would need to determine d/c.  Patient was noted to still be on a liquid diet and states she has been having "stomach problems" all day.  IF patient requires SNF placement over the weekend- d/c summary/meds will need to be provided to SNF of choice by tomorrow. CSW will continue to follow.  Lorri Frederick. [place]  [phone]

## 2011-10-18 NOTE — Progress Notes (Signed)
Subjective: 3 Days Post-Op Procedure(s) (LRB): POSTERIOR CERVICAL FUSION/FORAMINOTOMY LEVEL 2 (N/A) Patient reports pain as moderate.  States she is nauseated and cannot eat.  Pain meds not helping.  Note from pharmacy indicated pt not getting percocet for pain because of scheduled tylenol. Slow progress with PT  Objective: Vital signs in last 24 hours: Temp:  [98.4 F (36.9 C)-98.8 F (37.1 C)] 98.8 F (37.1 C) (08/22 0538) Pulse Rate:  [88-93] 92  (08/22 0538) Resp:  [19-20] 20  (08/22 0538) BP: (154-164)/(77-79) 154/77 mmHg (08/22 0538) SpO2:  [92 %-93 %] 93 % (08/22 0538)  Intake/Output from previous day: 08/21 0701 - 08/22 0700 In: 1600 [P.O.:1000; I.V.:600] Out: -  Intake/Output this shift:    No results found for this basename: HGB:5 in the last 72 hours No results found for this basename: WBC:2,RBC:2,HCT:2,PLT:2 in the last 72 hours No results found for this basename: NA:2,K:2,CL:2,CO2:2,BUN:2,CREATININE:2,GLUCOSE:2,CALCIUM:2 in the last 72 hours No results found for this basename: LABPT:2,INR:2 in the last 72 hours  Neurovascular intact Incision: no drainage  Assessment/Plan: 3 Days Post-Op Procedure(s) (LRB): POSTERIOR CERVICAL FUSION/FORAMINOTOMY LEVEL 2 (N/A) Discharge to SNF when bed available Change percocet to Oxy IR   Kaidan Spengler M 10/18/2011, 1:04 PM

## 2011-10-18 NOTE — Progress Notes (Signed)
Physical Therapy Treatment Patient Details Name: Jodi Chavez MRN: [id number] DOB: [birth year] Today's Date: 10/18/2011 Time: 4098-1191 PT Time Calculation (min): 23 min  PT Assessment / Plan / Recommendation Comments on Treatment Session  Pt mobility improving but continues to require assistance.  Pt able to recall 3/4 cervical precautions.      Follow Up Recommendations  Skilled nursing facility;Supervision/Assistance - 24 hour    Barriers to Discharge        Equipment Recommendations  Rolling walker with 5" wheels    Recommendations for Other Services    Frequency Min 5X/week   Plan Discharge plan remains appropriate;Frequency remains appropriate    Precautions / Restrictions Precautions Precautions: Cervical Restrictions Weight Bearing Restrictions: No   Pertinent Vitals/Pain Pt c/o posterior neck and low back pain     Mobility  Bed Mobility Bed Mobility: Rolling Right;Right Sidelying to Sit Rolling Right: 3: Mod assist Right Sidelying to Sit: 3: Mod assist Transfers Transfers: Sit to Stand;Stand to Sit Sit to Stand: 4: Min guard;From chair/3-in-1;With upper extremity assist;From bed Stand to Sit: 4: Min guard;To chair/3-in-1;With upper extremity assist Details for Transfer Assistance: Pt required excessive effort to stand from sitting.  Ambulation/Gait Ambulation/Gait Assistance: 4: Min guard Ambulation Distance (Feet): 100 Feet Assistive device: Rolling walker Ambulation/Gait Assistance Details: Pt continues to demonstrate instabiity when ambulating with RW.  Pt c/o low back pain and presents with apparent weakness in R LE in Left swing phase ( R knee flexed). Gait Pattern: Decreased stride length;Narrow base of support Gait velocity: < 1 ft/sec Stairs: No Wheelchair Mobility Wheelchair Mobility: No    Exercises     PT Diagnosis:    PT Problem List:   PT Treatment Interventions:     PT Goals Acute Rehab PT Goals PT Goal Formulation: With patient Time  For Goal Achievement: 10/24/11 Potential to Achieve Goals: Good Pt will Roll Supine to Right Side: with modified independence PT Goal: Rolling Supine to Right Side - Progress: Progressing toward goal Pt will go Supine/Side to Sit: with modified independence PT Goal: Supine/Side to Sit - Progress: Progressing toward goal Pt will go Sit to Supine/Side: with modified independence PT Goal: Sit to Supine/Side - Progress: Progressing toward goal Pt will go Sit to Stand: with modified independence PT Goal: Sit to Stand - Progress: Progressing toward goal Pt will go Stand to Sit: with modified independence PT Goal: Stand to Sit - Progress: Progressing toward goal Pt will Ambulate: 51 - 150 feet;with modified independence;with least restrictive assistive device PT Goal: Ambulate - Progress: Progressing toward goal Additional Goals Additional Goal #1: Pt will verbalize and demonstrate 4/4 cervical precautions.  PT Goal: Additional Goal #1 - Progress: Progressing toward goal  Visit Information  Last PT Received On: 10/18/11    Subjective Data  Subjective: I was nauseated earlier but I feel better now.   Patient Stated Goal: Return to home when I am ready   Cognition  Overall Cognitive Status: History of cognitive impairments - at baseline Arousal/Alertness: Awake/alert Orientation Level: Appears intact for tasks assessed Behavior During Session: [facility] for tasks performed    Balance  Balance Balance Assessed: No  End of Session PT - End of Session Equipment Utilized During Treatment: Gait belt;Cervical collar Activity Tolerance: Patient tolerated treatment well Patient left: in chair;with call bell/phone within reach Nurse Communication: Mobility status;Precautions   GP     Andrya Roppolo 10/18/2011, 4:39 PM [facility] L. Sharad Vaneaton DPT [phone]

## 2011-10-19 LAB — GLUCOSE, CAPILLARY: Glucose-Capillary: 103 mg/dL — ABNORMAL HIGH (ref 70–99)

## 2011-10-19 MED ORDER — OXYCODONE HCL 5 MG PO TABS
5.0000 mg | ORAL_TABLET | ORAL | Status: DC | PRN
  Administered 2011-10-19: 5 mg via ORAL
  Filled 2011-10-19: qty 1

## 2011-10-19 MED ORDER — POLYETHYLENE GLYCOL 3350 17 G PO PACK
17.0000 g | PACK | Freq: Every day | ORAL | Status: AC
Start: 2011-10-19 — End: 2011-10-22

## 2011-10-19 MED ORDER — OXYCODONE HCL 5 MG PO TABS: 5.0000 mg | ORAL_TABLET | ORAL | Status: DC | PRN

## 2011-10-19 MED ORDER — POLYETHYLENE GLYCOL 3350 17 G PO PACK
17.0000 g | PACK | Freq: Every day | ORAL | Status: DC
  Administered 2011-10-19: 17 g via ORAL
  Filled 2011-10-19: qty 1

## 2011-10-19 MED ORDER — METHOCARBAMOL 500 MG PO TABS: 500.0000 mg | ORAL_TABLET | Freq: Three times a day (TID) | ORAL | Status: DC

## 2011-10-19 NOTE — Progress Notes (Signed)
Subjective: 4 Days Post-Op Procedure(s) (LRB): POSTERIOR CERVICAL FUSION/FORAMINOTOMY LEVEL 2 (N/A) today the patient states that she is still having some nausea and still experiencing pain. She reports that she didn't think that the surgery would be this difficult. 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 Her brother-in-law is also unable to physically assist in her care. Physical therapy he states that she is doing okay but still has limitations of function and there is a cognitive difficulty in her ability to respond to commands and to remember Her limitations associated with this procedure. She additionally has problems with hip pain an apparent leg length discrepancy which has been a preoperative condition. Her nausea still limits her ability to heat she is not taking in the normal diet. I think overall these problems can be dealt with as an outpatient however she still has sufficient dependencies that I don't think she is capable of being sent home alone she has lived alone up until this condition began approximately 3 months ago.  Patient reports pain as mild.    Objective:   VITALS:  Temp:  [98.2 F (36.8 C)-98.5 F (36.9 C)] 98.2 F (36.8 C) (08/23 0528) Pulse Rate:  [72-84] 84  (08/23 0528) Resp:  [16-18] 18  (08/23 0528) BP: (130-171)/(66-71) 171/71 mmHg (08/23 0528) SpO2:  [94 %-98 %] 95 % (08/23 0528)  Neurologically intact ABD soft Neurovascular intact Sensation intact distally Intact pulses distally Dorsiflexion/Plantar flexion intact Incision: no drainage Dressing was changed today there is some mild swelling evident with minimal redness. Complaints of puffiness likely related to continued IV fluid. We'll discontinue her IV. She is stable from an orthopedic standpoint has not had a bowel movement as yet and will use laxatives apparently uses ex-lax at home.  LABS No results found  for this basename: HGB:5,WBC:2,PLT:2 in the last 72 hours No results found for this basename: NA:2,K:2,CL:2,CO2:2,BUN:2,CREATININE:2,GLUCOSE:2, in the last 72 hours No results found for this basename: LABPT:2,INR:2 in the last 72 hours   Assessment/Plan: 4 Days Post-Op Procedure(s) (LRB): POSTERIOR CERVICAL FUSION/FORAMINOTOMY LEVEL 2 (N/A) Dependencies of care and in activities of daily living which prevent this patient from being able to be discharged to home without assistance. Family prior to surgery had planned to care for her but during her initial postoperative care change their minds regarding their capacity to care for her. Skilled nursing home placement has been undertaken and this patient is ready for discharge at this time.  Advance diet Up with therapy D/C IV fluids Discharge to SNF  Tauren Delbuono E 10/19/2011, 9:24 AM

## 2011-10-19 NOTE — Discharge Summary (Signed)
Physician Discharge Summary  Patient ID: Jodi Chavez MRN: [id number] DOB/AGE: [birth year] 61 y.o.  Admit date: 10/15/2011 Discharge date: 10/19/2011   Admission Diagnoses:  Principal Problem:  *HNP (herniated nucleus pulposus), cervical Active Problems:  Cervical spondylosis with radiculopathy   Discharge Diagnoses:  Same  Past Medical History  Diagnosis Date  . PONV (postoperative nausea and vomiting)   . Hypertension     takes Benazepril nightly and Propranolol tid and Clonidine daily  . Hyperlipidemia     takes Zetia and Zocor daily  . History of bronchitis     last time several yrs ago  . History of migraine     many yrs ago  . Dizziness     occasionally and related to meds   . Peripheral edema   . Peripheral neuropathy   . Arthritis     hip  . Low back pain   . Constipation     uses laxatives several times a week  . Slow urinary stream     occasionally  . Diabetes mellitus     takes Metformin and Amaryl daily  . Early cataracts, bilateral   . Depression   . Insomnia     Surgeries: Procedure(s): POSTERIOR CERVICAL FUSION/FORAMINOTOMY LEVEL 2 on 10/15/2011   Consultants:    Discharged Condition: Improved  Hospital Course: Jodi Chavez is an 61 y.o. female who was admitted 10/15/2011 with a chief complaint of No chief complaint on file. , and found to have a diagnosis of HNP (herniated nucleus pulposus), cervical.  They were brought to the operating room on 10/15/2011 and underwent the above named procedures.    They were given perioperative antibiotics:  Anti-infectives     Start     Dose/Rate Route Frequency Ordered Stop   10/15/11 1600   ceFAZolin (ANCEF) IVPB 1 g/50 mL premix        1 g 100 mL/hr over 30 Minutes Intravenous Every 8 hours 10/15/11 1433 10/16/11 0106   10/14/11 1400   ceFAZolin (ANCEF) 3 g in dextrose 5 % 50 mL IVPB        3 g 160 mL/hr over 30 Minutes Intravenous 60 min pre-op 10/14/11 1400 10/15/11 0815        .  They were  given sequential compression devices, early ambulation, and chemoprophylaxis for DVT prophylaxis. Postoperatively the patient requested that the Foley catheter be left in place and it was left in place for an additional 1 day discontinued on postoperative day #2. Patient on postoperative day #1 he indicated that she had a headache nausea and difficulty even turning over in bed due to discomfort. Her sister who was with her the evening of her surgery and postoperative day #2 originally had planned to have the patient, home with her following discharge however after the first postoperative day and noting the degree of dependency her sister requested that the patient be discharged to a skilled nursing facility and indicated that she would not be able to care for her. Patient normally lives alone however she has been living with her sister at her home since the cervical radiculopathy had begun this past April. With this patient had a Urecholine instituted and Foley catheter discontinued on postoperative day #2 physical therapy and occupational therapy consults were obtained to mobilize the patient and this was begun on postoperative day #1. The dressing evaluated the incision was dry without any redness or swelling on postoperative day #1 and 3 and on present day postop day #4. Patient  remained afebrile and vital signs were stable postoperatively. Physical therapy and occupational therapy gradually were able to improve the patient's dependency is and she is capable of standing better although it does require some minimal assistance and she is capable of walking short distances up to 100 feet at this time postoperative day #4. She is taking and tolerating a diet although complaining of nausea. She is tolerating by mouth narcotic medicines. IV fluids were discontinued on postoperative day #4. I had a frank discussion with the patient regarding her situation and the fact that she presently has discomfort and nausea is not  indicate a reason for hospitalization however her dependencies are the reason for placement in a skilled nursing facility in order to provide for adequate postoperative assistance with her activities of daily living and day to day ADL requirements. On postoperative day #4 her dressing was changed the incision showed minimal swelling minimal redness. The soft cervical collar is for comfort sake only. She is advised that she is stable to be discharged to skilled nursing facility or to the next level of care. She benefited maximally from their hospital stay and there were no complications.    Recent vital signs:  Filed Vitals:   10/19/11 0528  BP: 171/71  Pulse: 84  Temp: 98.2 F (36.8 C)  Resp: 18    Recent laboratory studies:  Results for orders placed during the hospital encounter of 10/15/11  GLUCOSE, CAPILLARY      Component Value Range   Glucose-Capillary 123 (*) 70 - 99 mg/dL  GLUCOSE, CAPILLARY      Component Value Range   Glucose-Capillary 159 (*) 70 - 99 mg/dL  GLUCOSE, CAPILLARY      Component Value Range   Glucose-Capillary 132 (*) 70 - 99 mg/dL   Comment 1 Documented in Chart     Comment 2 Notify RN    GLUCOSE, CAPILLARY      Component Value Range   Glucose-Capillary 125 (*) 70 - 99 mg/dL   Comment 1 Notify RN    GLUCOSE, CAPILLARY      Component Value Range   Glucose-Capillary 128 (*) 70 - 99 mg/dL  GLUCOSE, CAPILLARY      Component Value Range   Glucose-Capillary 152 (*) 70 - 99 mg/dL   Comment 1 Notify RN    GLUCOSE, CAPILLARY      Component Value Range   Glucose-Capillary 149 (*) 70 - 99 mg/dL   Comment 1 Notify RN    GLUCOSE, CAPILLARY      Component Value Range   Glucose-Capillary 139 (*) 70 - 99 mg/dL  GLUCOSE, CAPILLARY      Component Value Range   Glucose-Capillary 120 (*) 70 - 99 mg/dL  GLUCOSE, CAPILLARY      Component Value Range   Glucose-Capillary 144 (*) 70 - 99 mg/dL  GLUCOSE, CAPILLARY      Component Value Range   Glucose-Capillary 110 (*)  70 - 99 mg/dL   Comment 1 Notify RN    GLUCOSE, CAPILLARY      Component Value Range   Glucose-Capillary 137 (*) 70 - 99 mg/dL  GLUCOSE, CAPILLARY      Component Value Range   Glucose-Capillary 101 (*) 70 - 99 mg/dL  GLUCOSE, CAPILLARY      Component Value Range   Glucose-Capillary 102 (*) 70 - 99 mg/dL   Comment 1 Documented in Chart     Comment 2 Notify RN    GLUCOSE, CAPILLARY      Component  Value Range   Glucose-Capillary 95  70 - 99 mg/dL   Comment 1 Documented in Chart     Comment 2 Notify RN    GLUCOSE, CAPILLARY      Component Value Range   Glucose-Capillary 97  70 - 99 mg/dL  GLUCOSE, CAPILLARY      Component Value Range   Glucose-Capillary 103 (*) 70 - 99 mg/dL    Discharge Medications:   Medication List  As of 10/19/2011  9:46 AM   STOP taking these medications         acetaminophen 500 MG tablet      HYDROcodone-acetaminophen 5-325 MG per tablet      meloxicam 7.5 MG tablet         TAKE these medications         benazepril 40 MG tablet   Commonly known as: LOTENSIN   Take 40 mg by mouth every evening.      calcium-vitamin D 500-200 MG-UNIT per tablet   Commonly known as: OSCAL WITH D   Take 1 tablet by mouth daily.      cloNIDine 0.2 MG tablet   Commonly known as: CATAPRES   Take 0.1 mg by mouth 2 (two) times daily.      ezetimibe 10 MG tablet   Commonly known as: ZETIA   Take 10 mg by mouth daily.      gabapentin 300 MG capsule   Commonly known as: NEURONTIN   Take 300 mg by mouth 3 (three) times daily.      Garlic 1000 MG Caps   Take 1 capsule by mouth 3 (three) times daily.      glimepiride 2 MG tablet   Commonly known as: AMARYL   Take 2 mg by mouth daily before breakfast.      hydrochlorothiazide 25 MG tablet   Commonly known as: HYDRODIURIL   Take 25 mg by mouth daily.      ibuprofen 200 MG tablet   Commonly known as: ADVIL,MOTRIN   Take 200 mg by mouth every 6 (six) hours as needed.      metFORMIN 850 MG tablet   Commonly  known as: GLUCOPHAGE   Take 850 mg by mouth daily with breakfast.      methocarbamol 500 MG tablet   Commonly known as: ROBAXIN   Take 1 tablet (500 mg total) by mouth 3 (three) times daily.      multivitamin with minerals Tabs   Take 1 tablet by mouth daily.      OVER THE COUNTER MEDICATION   Apply 1 application topically daily. sween cream applied to groin area daily to prevent rash      oxyCODONE 5 MG immediate release tablet   Commonly known as: Oxy IR/ROXICODONE   Take 1 tablet (5 mg total) by mouth every 4 (four) hours as needed for pain.      polyethylene glycol packet   Commonly known as: MIRALAX / GLYCOLAX   Take 17 g by mouth daily.      propranolol 10 MG tablet   Commonly known as: INDERAL   Take 10 mg by mouth 3 (three) times daily.      simvastatin 40 MG tablet   Commonly known as: ZOCOR   Take 40 mg by mouth every evening.            Diagnostic Studies: Dg Chest 2 View  10/12/2011  *RADIOLOGY REPORT*  Clinical Data: Preoperative respiratory exam.  Cervical disc disease.  CHEST -  2 VIEW  Comparison: Thoracic radiographs dated 06/27/2011  Findings: Heart size and vascularity are normal and the lungs are clear.  No significant osseous abnormality.  Degenerative changes in the thoracic spine and upper lumbar spine, stable.  IMPRESSION: No acute abnormalities.  Original Report Authenticated By: Gwynn Burly, M.D.   Dg Cervical Spine 2-3 Views  10/15/2011  *RADIOLOGY REPORT*  Clinical Data: Assess for instrument positioned.  CERVICAL SPINE - 2-3 VIEW  Comparison: Radiography 06/27/2011  Findings: I discussed the case with Dr. Otelia Sergeant real time in the OR. There are limitations related to the C-arm technique and oblique positioning related to the patient's large size.  There are two clamps in position.  I believe these on the posterior elements of C6 and C7.  It is difficult to state this with absolute certainty.  IMPRESSION: Posterior element clamps felt to be at the  posterior elements of C6 and C7.  This cannot be stated with absolute certainty based on the limitations above.  Discussed real time with Dr. Otelia Sergeant.   Original Report Authenticated By: Thomasenia Sales, M.D. ( 10/15/2011 10:03:04 )    Dg C-arm 1-60 Min  10/17/2011  *RADIOLOGY REPORT*  Clinical Data: Assess for instrument positioned.  CERVICAL SPINE - 2-3 VIEW  Comparison: Radiography 06/27/2011  Findings: I discussed the case with Dr. Otelia Sergeant real time in the OR. There are limitations related to the C-arm technique and oblique positioning related to the patient's large size.  There are two clamps in position.  I believe these on the posterior elements of C6 and C7.  It is difficult to state this with absolute certainty.  IMPRESSION: Posterior element clamps felt to be at the posterior elements of C6 and C7.  This cannot be stated with absolute certainty based on the limitations above.  Discussed real time with Dr. Otelia Sergeant.   Original Report Authenticated By: Thomasenia Sales, M.D.     Disposition: 01-Home or Self Care  Discharge Orders    Future Orders Please Complete By Expires   Diet - low sodium heart healthy      Call MD / Call 911      Comments:   If you experience chest pain or shortness of breath, CALL 911 and be transported to the hospital emergency room.  If you develope a fever above 101 F, pus (white drainage) or increased drainage or redness at the wound, or calf pain, call your surgeon's office.   Constipation Prevention      Comments:   Drink plenty of fluids.  Prune juice may be helpful.  You may use a stool softener, such as Colace (over the counter) 100 mg twice a day.  Use MiraLax (over the counter) for constipation as needed.   Increase activity slowly as tolerated      Discharge instructions      Comments:   No lifting greater than 10 lbs. No overhead use of arms. Avoid bending,and twisting neck. Walk in house for first week them may start to get out slowly increasing distance up to  one mile by 3 weeks post op. Keep incision dry for 3 days, may then bathe and wet incision using a soft cervical collar as needed for Call if any fevers >101, chills, or increasing numbness or weakness or increased swelling or drainage.   Driving restrictions      Comments:   No driving for 3 weeks   Lifting restrictions      Comments:   No lifting for  4-6 weeks      Follow-up Information    Follow up with NITKA,JAMES E, MD in 2 weeks. (seeing Maud Deed PA-C)    Contact information:   [facility] [address] [place] [address] [phone]           Signed: Kerrin Champagne 10/19/2011, 9:46 AM

## 2011-10-19 NOTE — Progress Notes (Signed)
Utilization review completed. Ronie Fleeger, RN, BSN. 

## 2011-10-19 NOTE — Progress Notes (Signed)
Clinical Social Work  CSW spoke with patient and sister who both agreed on [facility] for SNF. CSW faxed dc summary to SNF who is agreeable to admission today. CSW prepared dc packet. CSW informed patient, sister and RN of dc and all were agreeable. CSW coordinated transportation via [place]. CSW is signing off.  [place], [place] [phone] (Coverage for [name])

## 2011-12-21 ENCOUNTER — Ambulatory Visit: Payer: Medicare Other | Attending: Specialist | Admitting: Physical Therapy

## 2011-12-21 DIAGNOSIS — M542 Cervicalgia: Secondary | ICD-10-CM | POA: Insufficient documentation

## 2011-12-21 DIAGNOSIS — M25519 Pain in unspecified shoulder: Secondary | ICD-10-CM | POA: Insufficient documentation

## 2011-12-21 DIAGNOSIS — IMO0001 Reserved for inherently not codable concepts without codable children: Secondary | ICD-10-CM | POA: Insufficient documentation

## 2011-12-21 DIAGNOSIS — M25619 Stiffness of unspecified shoulder, not elsewhere classified: Secondary | ICD-10-CM | POA: Insufficient documentation

## 2011-12-24 ENCOUNTER — Ambulatory Visit: Payer: Medicare Other | Admitting: Physical Therapy

## 2011-12-27 ENCOUNTER — Ambulatory Visit: Payer: Medicare Other | Admitting: Physical Therapy

## 2011-12-31 ENCOUNTER — Ambulatory Visit: Payer: Medicare Other | Attending: Specialist | Admitting: Physical Therapy

## 2011-12-31 DIAGNOSIS — M542 Cervicalgia: Secondary | ICD-10-CM | POA: Insufficient documentation

## 2011-12-31 DIAGNOSIS — M25619 Stiffness of unspecified shoulder, not elsewhere classified: Secondary | ICD-10-CM | POA: Insufficient documentation

## 2011-12-31 DIAGNOSIS — IMO0001 Reserved for inherently not codable concepts without codable children: Secondary | ICD-10-CM | POA: Insufficient documentation

## 2011-12-31 DIAGNOSIS — M25519 Pain in unspecified shoulder: Secondary | ICD-10-CM | POA: Insufficient documentation

## 2012-01-02 ENCOUNTER — Ambulatory Visit: Payer: Medicare Other | Admitting: Physical Therapy

## 2012-01-07 ENCOUNTER — Ambulatory Visit: Payer: Medicare Other | Admitting: Physical Therapy

## 2012-01-09 ENCOUNTER — Ambulatory Visit: Payer: Medicare Other | Admitting: Physical Therapy

## 2012-01-14 ENCOUNTER — Encounter: Payer: Medicare Other | Admitting: Physical Therapy

## 2012-01-16 ENCOUNTER — Encounter: Payer: Medicare Other | Admitting: Physical Therapy

## 2012-03-03 ENCOUNTER — Ambulatory Visit: Payer: Medicare Other | Attending: Orthopedic Surgery | Admitting: Physical Therapy

## 2012-03-03 DIAGNOSIS — IMO0001 Reserved for inherently not codable concepts without codable children: Secondary | ICD-10-CM | POA: Insufficient documentation

## 2012-03-03 DIAGNOSIS — M25519 Pain in unspecified shoulder: Secondary | ICD-10-CM | POA: Insufficient documentation

## 2012-03-03 DIAGNOSIS — M542 Cervicalgia: Secondary | ICD-10-CM | POA: Insufficient documentation

## 2012-03-03 DIAGNOSIS — M25619 Stiffness of unspecified shoulder, not elsewhere classified: Secondary | ICD-10-CM | POA: Insufficient documentation

## 2012-03-11 ENCOUNTER — Ambulatory Visit: Payer: Medicare Other | Admitting: Physical Therapy

## 2012-03-12 ENCOUNTER — Ambulatory Visit: Payer: Medicare Other | Admitting: Physical Therapy

## 2012-03-17 ENCOUNTER — Ambulatory Visit: Payer: Medicare Other | Admitting: Physical Therapy

## 2012-03-19 ENCOUNTER — Ambulatory Visit: Payer: Medicare Other | Admitting: Physical Therapy

## 2012-03-24 ENCOUNTER — Ambulatory Visit: Payer: Medicare Other | Admitting: Physical Therapy

## 2012-03-26 ENCOUNTER — Ambulatory Visit: Payer: Medicare Other | Admitting: Physical Therapy

## 2012-03-31 ENCOUNTER — Ambulatory Visit: Payer: Medicare Other | Attending: Orthopedic Surgery | Admitting: Physical Therapy

## 2012-03-31 DIAGNOSIS — IMO0001 Reserved for inherently not codable concepts without codable children: Secondary | ICD-10-CM | POA: Insufficient documentation

## 2012-03-31 DIAGNOSIS — M25619 Stiffness of unspecified shoulder, not elsewhere classified: Secondary | ICD-10-CM | POA: Insufficient documentation

## 2012-03-31 DIAGNOSIS — M25519 Pain in unspecified shoulder: Secondary | ICD-10-CM | POA: Insufficient documentation

## 2012-03-31 DIAGNOSIS — M542 Cervicalgia: Secondary | ICD-10-CM | POA: Insufficient documentation

## 2012-04-03 ENCOUNTER — Ambulatory Visit: Payer: Medicare Other | Admitting: Physical Therapy

## 2012-04-07 ENCOUNTER — Ambulatory Visit: Payer: Medicare Other | Admitting: Physical Therapy

## 2012-04-09 ENCOUNTER — Ambulatory Visit: Payer: Medicare Other | Admitting: Physical Therapy

## 2012-06-17 ENCOUNTER — Other Ambulatory Visit: Payer: Self-pay

## 2012-06-17 ENCOUNTER — Other Ambulatory Visit: Payer: Self-pay | Admitting: Internal Medicine

## 2012-06-17 DIAGNOSIS — Z1231 Encounter for screening mammogram for malignant neoplasm of breast: Secondary | ICD-10-CM

## 2012-07-08 ENCOUNTER — Other Ambulatory Visit: Payer: Self-pay

## 2012-07-08 DIAGNOSIS — E2839 Other primary ovarian failure: Secondary | ICD-10-CM

## 2012-07-09 ENCOUNTER — Encounter: Payer: Self-pay | Admitting: Internal Medicine

## 2012-07-17 ENCOUNTER — Ambulatory Visit
Admission: RE | Admit: 2012-07-17 | Discharge: 2012-07-17 | Disposition: A | Discharge status: Home / Self Care | Payer: Medicare Other | Source: Ambulatory Visit

## 2012-07-17 ENCOUNTER — Other Ambulatory Visit: Payer: Self-pay | Admitting: Internal Medicine

## 2012-07-17 DIAGNOSIS — E2839 Other primary ovarian failure: Secondary | ICD-10-CM

## 2012-07-17 DIAGNOSIS — Z1231 Encounter for screening mammogram for malignant neoplasm of breast: Secondary | ICD-10-CM

## 2012-08-11 ENCOUNTER — Telehealth: Payer: Self-pay | Admitting: Gastroenterology

## 2012-08-11 ENCOUNTER — Ambulatory Visit (AMBULATORY_SURGERY_CENTER): Payer: Medicare Other | Admitting: *Deleted

## 2012-08-11 VITALS — Ht 61.5 in | Wt 223.2 lb

## 2012-08-11 DIAGNOSIS — Z800 Family history of malignant neoplasm of digestive organs: Secondary | ICD-10-CM

## 2012-08-11 DIAGNOSIS — Z1211 Encounter for screening for malignant neoplasm of colon: Secondary | ICD-10-CM

## 2012-08-11 MED ORDER — MOVIPREP 100 G PO SOLR: 1.0000 | Freq: Once | ORAL | Status: DC

## 2012-08-11 NOTE — Progress Notes (Signed)
Pt has family history of colon cancer in mother. ewm No egg or soy allergy. ewm No home 02 use, no cpap. ewm Pt has had post op nausea and vomiting but no other problems with past sedation. ewm Pt states she had a colonoscopy with Dr Victorino Dike but not sure how long ago, she thinks in the 90's and was normal per pt . ewm

## 2012-08-11 NOTE — Telephone Encounter (Signed)
lvm for pt. Pharmacy sent a fax saying moviprep was too expensive for pt. I told her I will leave a voucher for a free prep at our front desk and to call me with any questions.

## 2012-08-21 ENCOUNTER — Encounter: Payer: Medicare Other | Admitting: Internal Medicine

## 2012-12-09 ENCOUNTER — Ambulatory Visit: Payer: Self-pay

## 2013-01-06 ENCOUNTER — Ambulatory Visit (INDEPENDENT_AMBULATORY_CARE_PROVIDER_SITE_OTHER): Payer: Medicare Other

## 2013-01-06 VITALS — Ht 62.0 in | Wt 220.0 lb

## 2013-01-06 DIAGNOSIS — E1149 Type 2 diabetes mellitus with other diabetic neurological complication: Secondary | ICD-10-CM

## 2013-01-06 DIAGNOSIS — L608 Other nail disorders: Secondary | ICD-10-CM

## 2013-01-06 DIAGNOSIS — E1140 Type 2 diabetes mellitus with diabetic neuropathy, unspecified: Secondary | ICD-10-CM

## 2013-01-06 DIAGNOSIS — Q828 Other specified congenital malformations of skin: Secondary | ICD-10-CM

## 2013-01-06 DIAGNOSIS — E1142 Type 2 diabetes mellitus with diabetic polyneuropathy: Secondary | ICD-10-CM

## 2013-01-06 NOTE — Progress Notes (Signed)
  Subjective:    Patient ID: Jodi Chavez, female    DOB: May 19, 1950, 62 y.o.   MRN: [id number] "Trim my toenails."   HPI no changes in medication her health history at this time    Review of Systems no new complaints or changes in her status at this time.     Objective:   Physical Exam Neurovascular status as follows pedal pulses palpable DP postal for PT nonpalpable zero over four bilateral absent hair growth diminished and texture and turgor bilateral no edema mild varicosities noted neurologically epicritic and proprioceptive sensations grossly diminished on Semmes Weinstein testing. Normal plantar response DTRs not elicited. Nails thick criptotic incurvated debrided and the presence of diabetes cocking factors 2 through 5 bilateral has had previous avulsion of both hallux nails patient also multiple keratoses distal clavus 23 and 5 of the left and pinch callus in the first MTP area right.       Assessment & Plan:  Assessment diabetes with peripheral neuropathy, plan at this time debridement of multiple nails 2 through 5 bilateral debridement present diabetes cocking factors also multiple keratoses pinch callus first right and distal clavus 23 and 5 left are debrided: Debridement of keratoses Neosporin Neosporin applied to the first right and distal second left maintain palliative care in the future and as-needed basis suggest a 3 month followup maintain a coming shoe gear all time contacted in changes or exacerbations occur with her feet X.  Alvan Dame DPM

## 2013-01-06 NOTE — Patient Instructions (Signed)
Diabetes and Foot Care Diabetes may cause you to have problems because of poor blood supply (circulation) to your feet and legs. This may cause the skin on your feet to become thinner, break easier, and heal more slowly. Your skin may become dry, and the skin may peel and crack. You may also have nerve damage in your legs and feet causing decreased feeling in them. You may not notice minor injuries to your feet that could lead to infections or more serious problems. Taking care of your feet is one of the most important things you can do for yourself.  HOME CARE INSTRUCTIONS  Wear shoes at all times, even in the house. Do not go barefoot. Bare feet are easily injured.  Check your feet daily for blisters, cuts, and redness. If you cannot see the bottom of your feet, use a mirror or ask someone for help.  Wash your feet with warm water (do not use hot water) and mild soap. Then pat your feet and the areas between your toes until they are completely dry. Do not soak your feet as this can dry your skin.  Apply a moisturizing lotion or petroleum jelly (that does not contain alcohol and is unscented) to the skin on your feet and to dry, brittle toenails. Do not apply lotion between your toes.  Trim your toenails straight across. Do not dig under them or around the cuticle. File the edges of your nails with an emery board or nail file.  Do not cut corns or calluses or try to remove them with medicine.  Wear clean socks or stockings every day. Make sure they are not too tight. Do not wear knee-high stockings since they may decrease blood flow to your legs.  Wear shoes that fit properly and have enough cushioning. To break in new shoes, wear them for just a few hours a day. This prevents you from injuring your feet. Always look in your shoes before you put them on to be sure there are no objects inside.  Do not cross your legs. This may decrease the blood flow to your feet.  If you find a minor scrape,  cut, or break in the skin on your feet, keep it and the skin around it clean and dry. These areas may be cleansed with mild soap and water. Do not cleanse the area with peroxide, alcohol, or iodine.  When you remove an adhesive bandage, be sure not to damage the skin around it.  If you have a wound, look at it several times a day to make sure it is healing.  Do not use heating pads or hot water bottles. They may burn your skin. If you have lost feeling in your feet or legs, you may not know it is happening until it is too late.  Make sure your health care provider performs a complete foot exam at least annually or more often if you have foot problems. Report any cuts, sores, or bruises to your health care provider immediately. SEEK MEDICAL CARE IF:   You have an injury that is not healing.  You have cuts or breaks in the skin.  You have an ingrown nail.  You notice redness on your legs or feet.  You feel burning or tingling in your legs or feet.  You have pain or cramps in your legs and feet.  Your legs or feet are numb.  Your feet always feel cold. SEEK IMMEDIATE MEDICAL CARE IF:   There is increasing redness,   swelling, or pain in or around a wound.  There is a red line that goes up your leg.  Pus is coming from a wound.  You develop a fever or as directed by your health care provider.  You notice a bad smell coming from an ulcer or wound. Document Released: 02/10/2000 Document Revised: 10/15/2012 Document Reviewed: 07/22/2012 Exit[employer] Patient Information 2014 [employer].  

## 2013-03-24 ENCOUNTER — Ambulatory Visit: Payer: Medicare Other

## 2013-04-21 ENCOUNTER — Ambulatory Visit: Payer: Medicare Other

## 2013-05-19 ENCOUNTER — Ambulatory Visit: Payer: Medicare Other

## 2013-06-16 ENCOUNTER — Ambulatory Visit: Payer: Medicare Other

## 2013-07-14 ENCOUNTER — Ambulatory Visit: Payer: Medicare Other

## 2013-08-04 ENCOUNTER — Ambulatory Visit: Payer: Medicare Other

## 2013-09-08 ENCOUNTER — Ambulatory Visit: Payer: Medicare Other

## 2013-09-18 ENCOUNTER — Other Ambulatory Visit: Payer: Self-pay

## 2013-09-18 DIAGNOSIS — Z1231 Encounter for screening mammogram for malignant neoplasm of breast: Secondary | ICD-10-CM

## 2013-09-30 ENCOUNTER — Ambulatory Visit
Admission: RE | Admit: 2013-09-30 | Discharge: 2013-09-30 | Disposition: A | Discharge status: Home / Self Care | Payer: Medicare Other | Source: Ambulatory Visit

## 2013-09-30 DIAGNOSIS — Z1231 Encounter for screening mammogram for malignant neoplasm of breast: Secondary | ICD-10-CM

## 2013-10-06 ENCOUNTER — Ambulatory Visit: Payer: Medicare Other

## 2013-10-20 ENCOUNTER — Ambulatory Visit (INDEPENDENT_AMBULATORY_CARE_PROVIDER_SITE_OTHER): Payer: Medicare Other

## 2013-10-20 DIAGNOSIS — E1149 Type 2 diabetes mellitus with other diabetic neurological complication: Secondary | ICD-10-CM

## 2013-10-20 DIAGNOSIS — L608 Other nail disorders: Secondary | ICD-10-CM

## 2013-10-20 DIAGNOSIS — Q828 Other specified congenital malformations of skin: Secondary | ICD-10-CM

## 2013-10-20 DIAGNOSIS — E1140 Type 2 diabetes mellitus with diabetic neuropathy, unspecified: Secondary | ICD-10-CM

## 2013-10-20 DIAGNOSIS — E1142 Type 2 diabetes mellitus with diabetic polyneuropathy: Secondary | ICD-10-CM

## 2013-10-20 NOTE — Progress Notes (Signed)
   Subjective:    Patient ID: Jodi Chavez, female    DOB: 02-12-1951, 63 y.o.   MRN: [id number]  HPI Comments: Pt request toenails trimming. Pt states no change in the medications or health status.      Review of Systems No new findings or systemic changes noted been more than 6 months to 8 months since patient was last seen nails are extremely overgrown hypertrophic with fissuring and callus in the skin in particular first MTP area right    Objective:   Physical Exam Neurovascular status is intact with pedal pulses palpable DP and PT plus one over 4 bilateral thready on the PT pulse noted epicritic and proprioceptive sensations diminished on Semmes Weinstein to the forefoot and digits although there is intact sensation the medial arch dorsum of the foot and ankles distal toes and plantar foot has decreased sensation there is dry fissuring keratotic skin distal tuft left great toe and pinch callus of the first MTP area right nails thick brittle crumbly dystrophic friable with pain tenderness and discoloration. No open wounds or ulcers no secondary infections at this time has digital contractures hammertoes 2 through 5 bilateral hallux nails bilateral had previously been excised.      Assessment & Plan:  Assessment this time is diabetes with history peripheral neuropathy debridement of multiple nails 2 through 5 bilateral in the presence of onychomycosis and dystrophy friability of nails painful symptomatically with debrided also debridement multiple keratoses distal tuft of left hallux as well as first MTP area right with hemorrhage a hemorrhagic and fissuring keratoses on debridement there is keratoses treated with lumicain Neosporin and a Band-Aid nails are also debridement recheck in 3 months for continued palliative care is needed  Alvan Dame DPM

## 2013-10-20 NOTE — Patient Instructions (Signed)
Diabetes and Foot Care Diabetes may cause you to have problems because of poor blood supply (circulation) to your feet and legs. This may cause the skin on your feet to become thinner, break easier, and heal more slowly. Your skin may become dry, and the skin may peel and crack. You may also have nerve damage in your legs and feet causing decreased feeling in them. You may not notice minor injuries to your feet that could lead to infections or more serious problems. Taking care of your feet is one of the most important things you can do for yourself.  HOME CARE INSTRUCTIONS  Wear shoes at all times, even in the house. Do not go barefoot. Bare feet are easily injured.  Check your feet daily for blisters, cuts, and redness. If you cannot see the bottom of your feet, use a mirror or ask someone for help.  Wash your feet with warm water (do not use hot water) and mild soap. Then pat your feet and the areas between your toes until they are completely dry. Do not soak your feet as this can dry your skin.  Apply a moisturizing lotion or petroleum jelly (that does not contain alcohol and is unscented) to the skin on your feet and to dry, brittle toenails. Do not apply lotion between your toes.  Trim your toenails straight across. Do not dig under them or around the cuticle. File the edges of your nails with an emery board or nail file.  Do not cut corns or calluses or try to remove them with medicine.  Wear clean socks or stockings every day. Make sure they are not too tight. Do not wear knee-high stockings since they may decrease blood flow to your legs.  Wear shoes that fit properly and have enough cushioning. To break in new shoes, wear them for just a few hours a day. This prevents you from injuring your feet. Always look in your shoes before you put them on to be sure there are no objects inside.  Do not cross your legs. This may decrease the blood flow to your feet.  If you find a minor scrape,  cut, or break in the skin on your feet, keep it and the skin around it clean and dry. These areas may be cleansed with mild soap and water. Do not cleanse the area with peroxide, alcohol, or iodine.  When you remove an adhesive bandage, be sure not to damage the skin around it.  If you have a wound, look at it several times a day to make sure it is healing.  Do not use heating pads or hot water bottles. They may burn your skin. If you have lost feeling in your feet or legs, you may not know it is happening until it is too late.  Make sure your health care provider performs a complete foot exam at least annually or more often if you have foot problems. Report any cuts, sores, or bruises to your health care provider immediately. SEEK MEDICAL CARE IF:   You have an injury that is not healing.  You have cuts or breaks in the skin.  You have an ingrown nail.  You notice redness on your legs or feet.  You feel burning or tingling in your legs or feet.  You have pain or cramps in your legs and feet.  Your legs or feet are numb.  Your feet always feel cold. SEEK IMMEDIATE MEDICAL CARE IF:   There is increasing redness,   swelling, or pain in or around a wound.  There is a red line that goes up your leg.  Pus is coming from a wound.  You develop a fever or as directed by your health care provider.  You notice a bad smell coming from an ulcer or wound. Document Released: 02/10/2000 Document Revised: 10/15/2012 Document Reviewed: 07/22/2012 ExitCare Patient Information 2015 [employer]. This information is not intended to replace advice given to you by your health care provider. Make sure you discuss any questions you have with your health care provider.  

## 2014-01-26 ENCOUNTER — Ambulatory Visit: Payer: Medicare Other

## 2014-02-09 ENCOUNTER — Ambulatory Visit: Payer: Medicare Other

## 2014-02-23 ENCOUNTER — Ambulatory Visit: Payer: Medicare Other

## 2014-04-20 ENCOUNTER — Ambulatory Visit: Payer: Medicare Other

## 2014-05-17 ENCOUNTER — Ambulatory Visit (INDEPENDENT_AMBULATORY_CARE_PROVIDER_SITE_OTHER): Payer: Medicare Other | Admitting: Podiatry

## 2014-05-17 DIAGNOSIS — M79676 Pain in unspecified toe(s): Secondary | ICD-10-CM

## 2014-05-17 DIAGNOSIS — B351 Tinea unguium: Secondary | ICD-10-CM

## 2014-05-17 DIAGNOSIS — L84 Corns and callosities: Secondary | ICD-10-CM | POA: Diagnosis not present

## 2014-05-17 NOTE — Progress Notes (Signed)
   Subjective:    Patient ID: Jodi Chavez, female    DOB: 07-28-1950, 64 y.o.   MRN: [id number]  HPI  This patient presents today complaining of painful toenails and a painful plantar callus on the right foot. She is requesting skin a nail debridement.  Review of Systems  Musculoskeletal: Positive for back pain.  All other systems reviewed and are negative.      Objective:   Physical Exam  Orientated 3 Toenails 1-4 right and 2-5 left are elongated, brittle, dystrophic and tender to direct palpation Callus  skinfold plantar right first MPJ      Assessment & Plan:   Assessment: Diabetic with history of peripheral neuropathy Symptomatic onychomycoses 8 Plantar keratoses right 1  Plan: Debridement of toenails 8 and plantar keratoses 1 without any bleeding  Reappoint 3 months

## 2014-05-17 NOTE — Patient Instructions (Signed)
Diabetes and Foot Care Diabetes may cause you to have problems because of poor blood supply (circulation) to your feet and legs. This may cause the skin on your feet to become thinner, break easier, and heal more slowly. Your skin may become dry, and the skin may peel and crack. You may also have nerve damage in your legs and feet causing decreased feeling in them. You may not notice minor injuries to your feet that could lead to infections or more serious problems. Taking care of your feet is one of the most important things you can do for yourself.  HOME CARE INSTRUCTIONS  Wear shoes at all times, even in the house. Do not go barefoot. Bare feet are easily injured.  Check your feet daily for blisters, cuts, and redness. If you cannot see the bottom of your feet, use a mirror or ask someone for help.  Wash your feet with warm water (do not use hot water) and mild soap. Then pat your feet and the areas between your toes until they are completely dry. Do not soak your feet as this can dry your skin.  Apply a moisturizing lotion or petroleum jelly (that does not contain alcohol and is unscented) to the skin on your feet and to dry, brittle toenails. Do not apply lotion between your toes.  Trim your toenails straight across. Do not dig under them or around the cuticle. File the edges of your nails with an emery board or nail file.  Do not cut corns or calluses or try to remove them with medicine.  Wear clean socks or stockings every day. Make sure they are not too tight. Do not wear knee-high stockings since they may decrease blood flow to your legs.  Wear shoes that fit properly and have enough cushioning. To break in new shoes, wear them for just a few hours a day. This prevents you from injuring your feet. Always look in your shoes before you put them on to be sure there are no objects inside.  Do not cross your legs. This may decrease the blood flow to your feet.  If you find a minor scrape,  cut, or break in the skin on your feet, keep it and the skin around it clean and dry. These areas may be cleansed with mild soap and water. Do not cleanse the area with peroxide, alcohol, or iodine.  When you remove an adhesive bandage, be sure not to damage the skin around it.  If you have a wound, look at it several times a day to make sure it is healing.  Do not use heating pads or hot water bottles. They may burn your skin. If you have lost feeling in your feet or legs, you may not know it is happening until it is too late.  Make sure your health care provider performs a complete foot exam at least annually or more often if you have foot problems. Report any cuts, sores, or bruises to your health care provider immediately. SEEK MEDICAL CARE IF:   You have an injury that is not healing.  You have cuts or breaks in the skin.  You have an ingrown nail.  You notice redness on your legs or feet.  You feel burning or tingling in your legs or feet.  You have pain or cramps in your legs and feet.  Your legs or feet are numb.  Your feet always feel cold. SEEK IMMEDIATE MEDICAL CARE IF:   There is increasing redness,   swelling, or pain in or around a wound.  There is a red line that goes up your leg.  Pus is coming from a wound.  You develop a fever or as directed by your health care provider.  You notice a bad smell coming from an ulcer or wound. Document Released: 02/10/2000 Document Revised: 10/15/2012 Document Reviewed: 07/22/2012 ExitCare Patient Information 2015 [employer]. This information is not intended to replace advice given to you by your health care provider. Make sure you discuss any questions you have with your health care provider.  

## 2014-08-23 ENCOUNTER — Ambulatory Visit: Payer: Medicare Other | Admitting: Podiatry

## 2014-09-01 ENCOUNTER — Ambulatory Visit (INDEPENDENT_AMBULATORY_CARE_PROVIDER_SITE_OTHER): Payer: Medicare Other | Admitting: Podiatry

## 2014-09-01 DIAGNOSIS — E1140 Type 2 diabetes mellitus with diabetic neuropathy, unspecified: Secondary | ICD-10-CM | POA: Diagnosis not present

## 2014-09-01 DIAGNOSIS — B351 Tinea unguium: Secondary | ICD-10-CM

## 2014-09-01 DIAGNOSIS — L852 Keratosis punctata (palmaris et plantaris): Secondary | ICD-10-CM

## 2014-09-01 DIAGNOSIS — M79673 Pain in unspecified foot: Secondary | ICD-10-CM

## 2014-09-01 DIAGNOSIS — L84 Corns and callosities: Secondary | ICD-10-CM

## 2014-09-01 NOTE — Patient Instructions (Signed)
Diabetes and Foot Care Diabetes may cause you to have problems because of poor blood supply (circulation) to your feet and legs. This may cause the skin on your feet to become thinner, break easier, and heal more slowly. Your skin may become dry, and the skin may peel and crack. You may also have nerve damage in your legs and feet causing decreased feeling in them. You may not notice minor injuries to your feet that could lead to infections or more serious problems. Taking care of your feet is one of the most important things you can do for yourself.  HOME CARE INSTRUCTIONS  Wear shoes at all times, even in the house. Do not go barefoot. Bare feet are easily injured.  Check your feet daily for blisters, cuts, and redness. If you cannot see the bottom of your feet, use a mirror or ask someone for help.  Wash your feet with warm water (do not use hot water) and mild soap. Then pat your feet and the areas between your toes until they are completely dry. Do not soak your feet as this can dry your skin.  Apply a moisturizing lotion or petroleum jelly (that does not contain alcohol and is unscented) to the skin on your feet and to dry, brittle toenails. Do not apply lotion between your toes.  Trim your toenails straight across. Do not dig under them or around the cuticle. File the edges of your nails with an emery board or nail file.  Do not cut corns or calluses or try to remove them with medicine.  Wear clean socks or stockings every day. Make sure they are not too tight. Do not wear knee-high stockings since they may decrease blood flow to your legs.  Wear shoes that fit properly and have enough cushioning. To break in new shoes, wear them for just a few hours a day. This prevents you from injuring your feet. Always look in your shoes before you put them on to be sure there are no objects inside.  Do not cross your legs. This may decrease the blood flow to your feet.  If you find a minor scrape,  cut, or break in the skin on your feet, keep it and the skin around it clean and dry. These areas may be cleansed with mild soap and water. Do not cleanse the area with peroxide, alcohol, or iodine.  When you remove an adhesive bandage, be sure not to damage the skin around it.  If you have a wound, look at it several times a day to make sure it is healing.  Do not use heating pads or hot water bottles. They may burn your skin. If you have lost feeling in your feet or legs, you may not know it is happening until it is too late.  Make sure your health care provider performs a complete foot exam at least annually or more often if you have foot problems. Report any cuts, sores, or bruises to your health care provider immediately. SEEK MEDICAL CARE IF:   You have an injury that is not healing.  You have cuts or breaks in the skin.  You have an ingrown nail.  You notice redness on your legs or feet.  You feel burning or tingling in your legs or feet.  You have pain or cramps in your legs and feet.  Your legs or feet are numb.  Your feet always feel cold. SEEK IMMEDIATE MEDICAL CARE IF:   There is increasing redness,   swelling, or pain in or around a wound.  There is a red line that goes up your leg.  Pus is coming from a wound.  You develop a fever or as directed by your health care provider.  You notice a bad smell coming from an ulcer or wound. Document Released: 02/10/2000 Document Revised: 10/15/2012 Document Reviewed: 07/22/2012 ExitCare Patient Information 2015 [employer]. This information is not intended to replace advice given to you by your health care provider. Make sure you discuss any questions you have with your health care provider.  

## 2014-09-02 NOTE — Progress Notes (Signed)
Patient ID: Jodi JarvisDorcas P Chavez, female   DOB: 1950/06/27, 64 y.o.   MRN: [id number]  Subjective: This patient presents for scheduled visit requesting debridement of toenails and keratoses  Objective: The toenails 8 or brittle, dystrophic, incurvated and hypertrophic Plantar keratoses right first MPJ  Assessment: Diabetic peripheral neuropathy Mycotic toenails 8 Keratoses 1  Plan: Debridement of toenails mechanically electrically without any bleeding Debridement plantar keratoses 1 without any bleeding  Reappoint 3 months for skin a nail debridement

## 2014-10-19 ENCOUNTER — Other Ambulatory Visit: Payer: Self-pay | Admitting: Internal Medicine

## 2014-10-19 DIAGNOSIS — E2839 Other primary ovarian failure: Secondary | ICD-10-CM

## 2014-11-16 ENCOUNTER — Ambulatory Visit: Payer: Medicare Other | Attending: Specialist | Admitting: Physical Therapy

## 2014-11-16 DIAGNOSIS — R293 Abnormal posture: Secondary | ICD-10-CM | POA: Insufficient documentation

## 2014-11-16 DIAGNOSIS — G8929 Other chronic pain: Secondary | ICD-10-CM | POA: Diagnosis present

## 2014-11-16 DIAGNOSIS — M25562 Pain in left knee: Secondary | ICD-10-CM | POA: Insufficient documentation

## 2014-11-16 DIAGNOSIS — R269 Unspecified abnormalities of gait and mobility: Secondary | ICD-10-CM | POA: Diagnosis present

## 2014-11-16 NOTE — Therapy (Addendum)
[facility] [address] [place], [place], [address] Phone: [phone]   Fax:  [phone]  Physical Therapy Evaluation  Patient Details  Name: Jodi Chavez MRN: [id number] Date of Birth: [birth year] Referring Kaesyn Johnston:  Fleet Contras, MD  Encounter Date: 11/16/2014      PT End of Session - 11/16/14 1453    Visit Number 1   Number of Visits 16   Date for PT Re-Evaluation 01/11/15   PT Start Time 0215   PT Stop Time 0255   PT Time Calculation (min) 40 min   Activity Tolerance Patient tolerated treatment well   Behavior During Therapy [facility] for tasks assessed/performed      Past Medical History  Diagnosis Date  . PONV (postoperative nausea and vomiting)   . Hypertension     takes Benazepril nightly and Propranolol tid and Clonidine daily  . Hyperlipidemia     takes Zetia and Zocor daily  . History of bronchitis     last time several yrs ago  . History of migraine     many yrs ago  . Dizziness     occasionally and related to meds   . Peripheral edema   . Peripheral neuropathy   . Arthritis     hip  . Low back pain   . Constipation     uses laxatives several times a week  . Slow urinary stream     occasionally  . Diabetes mellitus     takes Metformin and Amaryl daily  . Early cataracts, bilateral   . Depression   . Insomnia     Past Surgical History  Procedure Laterality Date  . 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 D&c/hysteroscopy/ablation    . Dilation and curettage of uterus      couple of times  . Colonoscopy    . Esophagogastroduodenoscopy    . Posterior cervical fusion/foraminotomy  10/15/2011    Procedure: POSTERIOR CERVICAL FUSION/FORAMINOTOMY LEVEL 2;  Surgeon: Kerrin Champagne, MD;  Location: [facility] OR;  Service: Orthopedics;  Laterality: N/A;  Right C6-7, C7-T1 Foraminotomy with excision HNP C7-T1  . Upper gastrointestinal endoscopy      There were no vitals filed for  this visit.  Visit Diagnosis:  Knee pain, chronic, left  Posture abnormality  Abnormality of gait      Subjective Assessment - 11/16/14 1423    Subjective Pt c/o Lt. knee pain with no known mechanism of injury. She thinks the knee pain started in May, 2016. She has osteoarthrits in that knee. Pt states there is popping and cracking when she walks and moves from sit to stand.   Pertinent History Diabetic, 1 fall in last 6months, OSteopenia, Osteoarthritis Lt. Knee, neck surgery (cervical fusion)   Limitations Walking;House hold activities;Standing   How long can you stand comfortably? >42min   How long can you walk comfortably? >164ft very difficult to walk and painful   Diagnostic tests X ray August 20, 2014   Patient Stated Goals get rid of the pain   Currently in Pain? No/denies   Pain Score 0-No pain  walking 5/10   Pain Location Knee   Pain Orientation Left   Pain Descriptors / Indicators Aching   Pain Type Chronic pain   Pain Onset More than a month ago   Pain Frequency Intermittent   Aggravating Factors  standing, walking, household acitivites   Pain Relieving Factors medication help some, ice  [facility] PT Assessment - 11/16/14 1431    Assessment   Medical Diagnosis Lt. knee pain   Onset Date/Surgical Date 07/06/14   Next MD Visit December 01, 2014   Prior Therapy Yes for back    Precautions   Precautions None   Restrictions   Weight Bearing Restrictions No   Balance Screen   Has the patient fallen in the past 6 months Yes   How many times? 1   Has the patient had a decrease in activity level because of a fear of falling?  Yes   Is the patient reluctant to leave their home because of a fear of falling?  Yes   Home Environment   Living Environment Private residence   Living Arrangements Alone   Home Access Level entry   Home Layout One level   Home Equipment Walker - 2 wheels;Cane - single point;Other (comment)   Additional Comments rollator   Prior  Function   Level of Independence Independent with household mobility with device   Vocation Retired   IT consultant   Overall Cognitive Status Within Functional Limits for tasks assessed   Observation/Other Assessments   Observations impaired gait, walks with rolling walker, forward lean with gait   Focus on Therapeutic Outcomes (FOTO)  72%   Sensation   Light Touch Not tested   Coordination   Gross Motor Movements are Fluid and Coordinated Not tested   Fine Motor Movements are Fluid and Coordinated Not tested   Posture/Postural Control   Posture/Postural Control Postural limitations   Postural Limitations Rounded Shoulders;Forward head;Flexed trunk   ROM / Strength   AROM / PROM / Strength AROM;PROM;Strength   AROM   AROM Assessment Site Hip;Knee   Right/Left Hip --   Right/Left Knee Right;Left   Right Knee Extension -15   Right Knee Flexion 110   PROM   PROM Assessment Site Knee   Right/Left Knee Left   Left Knee Flexion 125   Strength   Strength Assessment Site Hip;Knee   Right/Left Hip Right;Left   Right Hip Flexion 4+/5   Left Hip Flexion 4+/5   Right/Left Knee Right;Left   Right Knee Flexion 4+/5   Right Knee Extension 4+/5   Left Knee Flexion 4+/5  with pain   Left Knee Extension 4+/5  with pain   Flexibility   Soft Tissue Assessment /Muscle Length yes   Hamstrings R -20, -25 Lt. in 90/90 position   Palpation   Patella mobility hypomobile patella bilaterally   Transfers   Five time sit to stand comments  use of hands completed 5 in 25seconds   Balance   Balance Assessed No   Functional Gait  Assessment   Gait assessed  No             PT Education - 11/16/14 1508    Education provided Yes   Education Details POC/PT, revised HEP given by doctor   Person(s) Educated Patient   Methods Explanation   Comprehension Verbalized understanding          PT Short Term Goals - 11/16/14 1551    PT SHORT TERM GOAL #1   Title Pt will be I with intial HEP    Time 2   Period Weeks   Status New   PT SHORT TERM GOAL #2   Title Pt will demo increased sit to stand time (5reps in under 20 sec) indicating improved LE strength   Time 3   Period Weeks   Status New  PT Long Term Goals - 2014-12-06 1552    PT LONG TERM GOAL #1   Title Pt will be I with advanced HEP   Time 8   Period Weeks   Status New   PT LONG TERM GOAL #2   Title Pt will demo improved ability to navigate 4 steps with use of one rail and cane in order to visit her sisters home   Baseline unable to do   Time 8   Period Weeks   Status New   PT LONG TERM GOAL #3   Title Pt will demo increased standing time for up to 30 minutes with one seated rest break in order to prepare a meal for herself   Baseline eating out currently   Time 8   Period Weeks   Status New   PT LONG TERM GOAL #4   Title Pt will demo improved sitting and standing posture able to maintain upright position with minimal verbal cues for   Baseline pt unable to stand upright for extended periods   Time 8   Period Weeks   Status New   PT LONG TERM GOAL #5   Title Pt demo improved knee extension to lacking no more than 5 degrees in supine   Time 8   Period Weeks   Status New   Additional Long Term Goals   Additional Long Term Goals Yes   PT LONG TERM GOAL #6   Title Pt will LE strength of 4+/5 without increased pain with resistance   Time 8   Period Weeks   Status New               Plan - 12-06-14 1521    Clinical Impression Statement Pt. is a 65 year old female who c/o  Lt. knee pain behind her patella. She presents with increased pain with resistance and weightbearing, impaired posture and gait stability, and decreased knee extension range of motion. She would benefit from skilled PT services to addres her current deficits and return to the use of a cane instead of a walker.   Pt will benefit from skilled therapeutic intervention in order to improve on the following deficits  Abnormal gait;Decreased range of motion;Difficulty walking;Obesity;Pain;Improper body mechanics;Impaired flexibility;Decreased balance;Decreased mobility;Decreased strength;Postural dysfunction   Rehab Potential Fair   Clinical Impairments Affecting Rehab Potential Pt. physical conditioning and posture, PMH   PT Frequency 2x / week   PT Duration 8 weeks   PT Treatment/Interventions Balance training;Ultrasound;Taping;Manual techniques;Moist Heat;Therapeutic exercise;Therapeutic activities;Armed forces logistics/support/administrative officer training;Patient/family education;Neuromuscular re-education;Cryotherapy;Passive range of motion;Gait training;DME Instruction;Iontophoresis 4mg /ml Dexamethasone   PT Next Visit Plan Balance assessment (DGI or BERG, Gait (TUG), VMO quad strengthening in supine, sit to stand, estim, try tape?   PT Home Exercise Plan --   Consulted and Agree with Plan of Care Patient          G-Codes - 12/06/14 1445    Functional Assessment Tool Used FOTO   Functional Limitation Mobility: Walking and moving around   Mobility: Walking and Moving Around Current Status [phone]) At least 60 percent but less than 80 percent impaired, limited or restricted   Mobility: Walking and Moving Around Goal Status (U0454) At least 40 percent but less than 60 percent impaired, limited or restricted     By signing I understand that I am ordering/authorizing the use of Iontophoresis using 4 mg/mL of dexamethasone as a component of this plan of care.  Problem List Patient Active Problem List   Diagnosis Date Noted  .  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 HNP (herniated nucleus pulposus), cervical 10/15/2011    Class: Chronic  . DIABETES MELLITUS, TYPE II 09/30/2006  . HYPERTENSION 09/30/2006  . DILATION AND CURETTAGE, HX OF 09/30/2006   Franciso Bend, SPT PAA,JENNIFER 11/17/2014, 10:46 AM  [facility] [address] [place], [place], [address] Phone: [phone]   Fax:  [phone]   Karie Mainland, PT 11/17/2014 10:46 AM Phone: [phone] Fax: [phone]

## 2014-11-16 NOTE — Patient Instructions (Signed)
Only perform 1st two exercises on doctor exercise sheet at this time, long arc quads and terminal knee extension

## 2014-11-18 ENCOUNTER — Ambulatory Visit: Payer: Medicare Other | Admitting: Physical Therapy

## 2014-11-18 DIAGNOSIS — M25562 Pain in left knee: Principal | ICD-10-CM

## 2014-11-18 DIAGNOSIS — R269 Unspecified abnormalities of gait and mobility: Secondary | ICD-10-CM

## 2014-11-18 DIAGNOSIS — R293 Abnormal posture: Secondary | ICD-10-CM

## 2014-11-18 DIAGNOSIS — G8929 Other chronic pain: Secondary | ICD-10-CM

## 2014-11-18 NOTE — Patient Instructions (Signed)
Cryotherapy Cryotherapy is when you put ice on your injury. Ice helps lessen pain and puffiness (swelling) after an injury. Ice works the best when you start using it in the first 24 to 48 hours after an injury. HOME CARE  Put a dry or damp towel between the ice pack and your skin.  You may press gently on the ice pack.  Leave the ice on for no more than 10 to 20 minutes at a time.  Check your skin after 5 minutes to make sure your skin is okay.  Rest at least 20 minutes between ice pack uses.  Stop using ice when your skin loses feeling (numbness).  Do not use ice on someone who cannot tell you when it hurts. This includes small children and people with memory problems (dementia). GET HELP RIGHT AWAY IF:  You have white spots on your skin.  Your skin turns blue or pale.  Your skin feels waxy or hard.  Your puffiness gets worse. MAKE SURE YOU:   Understand these instructions.  Will watch your condition.  Will get help right away if you are not doing well or get worse. Document Released: 08/01/2007 Document Revised: 05/07/2011 Document Reviewed: 10/05/2010 [facility] Patient Information 2015 [place], [place]. This information is not intended to replace advice given to you by your health care provider. Make sure you discuss any questions you have with your health care provider. Heat Therapy Heat therapy can help make painful, stiff muscles and joints feel better. Do not use heat on new injuries. Wait at least 48 hours after an injury to use heat. Do not use heat when you have aches or pains right after an activity. If you still have pain 3 hours after stopping the activity, then you may use heat. HOME CARE Wet heat pack  Soak a clean towel in warm water. Squeeze out the extra water.  Put the warm, wet towel in a plastic bag.  Place a thin, dry towel between your skin and the bag.  Put the heat pack on the area for 5 minutes, and check your skin. Your skin may be pink, but it  should not be red.  Leave the heat pack on the area for 15 to 30 minutes.  Repeat this every 2 to 4 hours while awake. Do not use heat while you are sleeping. Warm water bath  Fill a tub with warm water.  Place the affected body part in the tub.  Soak the area for 20 to 40 minutes.  Repeat as needed. Hot water bottle  Fill the water bottle half full with hot water.  Press out the extra air. Close the cap tightly.  Place a dry towel between your skin and the bottle.  Put the bottle on the area for 5 minutes, and check your skin. Your skin may be pink, but it should not be red.  Leave the bottle on the area for 15 to 30 minutes.  Repeat this every 2 to 4 hours while awake. Electric heating pad  Place a dry towel between your skin and the heating pad.  Set the heating pad on low heat.  Put the heating pad on the area for 10 minutes, and check your skin. Your skin may be pink, but it should not be red.  Leave the heating pad on the area for 20 to 40 minutes.  Repeat this every 2 to 4 hours while awake.  Do not lie on the heating pad.  Do not fall asleep  while using the heating pad.  Do not use the heating pad near water. GET HELP RIGHT AWAY IF:  You get blisters or red skin.  Your skin is puffy (swollen), or you lose feeling (numbness) in the affected area.  You have any new problems.  Your problems are getting worse.  You have any questions or concerns. If you have any problems, stop using heat therapy until you see your doctor. MAKE SURE YOU:  Understand these instructions.  Will watch your condition.  Will get help right away if you are not doing well or get worse. Document Released: 05/07/2011 Document Reviewed: 04/07/2013 [facility] Patient Information 2015 [place], [place]. This information is not intended to replace advice given to you by your health care provider. Make sure you discuss any questions you have with your health care  provider. Cryotherapy Cryotherapy is when you put ice on your injury. Ice helps lessen pain and puffiness (swelling) after an injury. Ice works the best when you start using it in the first 24 to 48 hours after an injury. HOME CARE  Put a dry or damp towel between the ice pack and your skin.  You may press gently on the ice pack.  Leave the ice on for no more than 10 to 20 minutes at a time.  Check your skin after 5 minutes to make sure your skin is okay.  Rest at least 20 minutes between ice pack uses.  Stop using ice when your skin loses feeling (numbness).  Do not use ice on someone who cannot tell you when it hurts. This includes small children and people with memory problems (dementia). GET HELP RIGHT AWAY IF:  You have white spots on your skin.  Your skin turns blue or pale.  Your skin feels waxy or hard.  Your puffiness gets worse. MAKE SURE YOU:   Understand these instructions.  Will watch your condition.  Will get help right away if you are not doing well or get worse. Document Released: 08/01/2007 Document Revised: 05/07/2011 Document Reviewed: 10/05/2010 [facility] Patient Information 2015 [place], [place]. This information is not intended to replace advice given to you by your health care provider. Make sure you discuss any questions you have with your health care provider.

## 2014-11-18 NOTE — Therapy (Addendum)
[place], [name], [address] Phone: [phone]   Fax:  [phone]  Physical Therapy Treatment  Patient Details  Name: Jodi Chavez MRN: [id number] Date of Birth: 03-25-1950 Referring Provider:  Nolene Ebbs, MD  Encounter Date: 11/18/2014      PT End of Session - 11/18/14 0958    Visit Number 2   Number of Visits 16   Date for PT Re-Evaluation 01/11/15   PT Start Time 6160   PT Stop Time 1000   PT Time Calculation (min) 73 min   Activity Tolerance Patient tolerated treatment well;Patient limited by pain   Behavior During Therapy [facility] for tasks assessed/performed      Past Medical History  Diagnosis Date  . PONV (postoperative nausea and vomiting)   . Hypertension     takes Benazepril nightly and Propranolol tid and Clonidine daily  . Hyperlipidemia     takes Zetia and Zocor daily  . History of bronchitis     last time several yrs ago  . History of migraine     many yrs ago  . Dizziness     occasionally and related to meds   . Peripheral edema   . Peripheral neuropathy   . Arthritis     hip  . Low back pain   . Constipation     uses laxatives several times a week  . Slow urinary stream     occasionally  . Diabetes mellitus     takes Metformin and Amaryl daily  . Early cataracts, bilateral   . Depression   . Insomnia     Past Surgical History  Procedure Laterality Date  . 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 D&c/hysteroscopy/ablation    . Dilation and curettage of uterus      couple of times  . Colonoscopy    . Esophagogastroduodenoscopy    . Posterior cervical fusion/foraminotomy  10/15/2011    Procedure: POSTERIOR CERVICAL FUSION/FORAMINOTOMY LEVEL 2;  Surgeon: Jessy Oto, MD;  Location: [facility];  Service: Orthopedics;  Laterality: N/A;  Right C6-7, C7-T1 Foraminotomy with excision HNP C7-T1  . Upper gastrointestinal endoscopy      There were  no vitals filed for this visit.  Visit Diagnosis:  Knee pain, chronic, left  Posture abnormality  Abnormality of gait      Subjective Assessment - 11/18/14 0857    Subjective 4/5 Lt knee today.  No pain at rest.Uses walker 100 %   Currently in Pain? Yes   Pain Score 4    Pain Location Knee   Pain Orientation Left   Pain Descriptors / Indicators Aching   Aggravating Factors  walking   Pain Relieving Factors rest   Multiple Pain Sites --  Sometimes hips and whole leg hurts,  walking on it.  Mild pain with moving.                           [place] Adult PT Treatment/Exercise - 11/18/14 0905    Berg Balance Test   Sit to Stand Able to stand without using hands and stabilize independently   Standing Unsupported Able to stand safely 2 minutes   Sitting with Back Unsupported but Feet Supported on Floor or Stool Able to sit safely and securely 2 minutes   Stand to Sit Controls descent by using hands   Transfers Able to transfer safely, minor use of  hands   Standing Unsupported with Eyes Closed Able to stand 10 seconds safely   Standing Ubsupported with Feet Together Able to place feet together independently and stand 1 minute safely   From Standing, Reach Forward with Outstretched Arm Can reach forward >12 cm safely (5")   From Standing Position, Pick up Object from Floor Unable to pick up shoe, but reaches 2-5 cm (1-2") from shoe and balances independently   From Standing Position, Turn to Look Behind Over each Shoulder Needs supervision when turning   Turn 360 Degrees Needs close supervision or verbal cueing   Standing Unsupported, Alternately Place Feet on Step/Stool Needs assistance to keep from falling or unable to try  did not think she should try   Standing Unsupported, One Foot in [place] balance while stepping or standing   Standing on One Leg Unable to try or needs assist to prevent fall  patient did not think she should try.   Total Score 34   Knee/Hip  Exercises: Stretches   ITB Stretch 1 rep  increased hip pain   ITB Stretch Limitations painful to hip   Knee/Hip Exercises: Machines for Strengthening   Other Machine Nu step 6.5 minutes  229 steps, L4   Knee/Hip Exercises: Standing   Heel Raises 20 reps  faitgued   Knee/Hip Exercises: Seated   Abduction/Adduction  10 reps  5 second holds, ball squeeze,  Manual for abduction  10 X ea   Knee/Hip Exercises: Supine   Short Arc Quad Sets Limitations with ball squeeze 10 X   Moist Heat Therapy   Number Minutes Moist Heat 15 Minutes   Moist Heat Location Knee                PT Education - 11/18/14 0956    Education provided Yes   Education Details use of heat and cold.      Use walker all the time   Person(s) Educated Patient   Methods Explanation;Handout   Comprehension Verbalized understanding          PT Short Term Goals - 11/16/14 1551    PT SHORT TERM GOAL #1   Title Pt will be I with intial HEP   Time 2   Period Weeks   Status New   PT SHORT TERM GOAL #2   Title Pt will demo increased sit to stand time (5reps in under 20 sec) indicating improved LE strength   Time 3   Period Weeks   Status New           PT Long Term Goals - 11/16/14 1552    PT LONG TERM GOAL #1   Title Pt will be I with advanced HEP   Time 8   Period Weeks   Status New   PT LONG TERM GOAL #2   Title Pt will demo improved ability to navigate 4 steps with use of one rail and cane in order to visit her sisters home   Baseline unable to do   Time 8   Period Weeks   Status New   PT LONG TERM GOAL #3   Title Pt will demo increased standing time for up to 30 minutes with one seated rest break in order to prepare a meal for herself   Baseline eating out currently   Time 8   Period Weeks   Status New   PT LONG TERM GOAL #4   Title Pt will demo improved sitting and standing posture able to maintain  upright position with minimal verbal cues for 30mn   Baseline pt unable to stand  upright for extended periods   Time 8   Period Weeks   Status New   PT LONG TERM GOAL #5   Title Pt demo improved knee extension to lacking no more than 5 degrees in supine   Time 8   Period Weeks   Status New   Additional Long Term Goals   Additional Long Term Goals Yes   PT LONG TERM GOAL #6   Title Pt will LE strength of 4+/5 without increased pain with resistance   Time 8   Period Weeks   Status New               Plan - 11/18/14 1000    Clinical Impression Statement 34/56 BERG.  Beginning exercises and Berg increased knee pain to 5/10.  Better with moist heat.   PT Next Visit Plan , Gait (TUG), VMO quad strengthening in supine, sit to stand, estim, try tape?   Consulted and Agree with Plan of Care Patient        Problem List Patient Active Problem List   Diagnosis Date Noted  . 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 HNP (herniated nucleus pulposus), cervical 10/15/2011    Class: Chronic  . DIABETES MELLITUS, TYPE II 09/30/2006  . HYPERTENSION 09/30/2006  . DILATION AND CURETTAGE, HX OF 09/30/2006    [facility], 10:02 AM  [facility] [address]Phone: [phone]  Fax:  [phone]    KMelvenia Needles PTA 11/18/2014 10:02 AM Phone: [phone]: [phone]    PHYSICAL THERAPY DISCHARGE SUMMARY  Visits from Start of Care: 2  Current functional level related to goals / functional outcomes: See above for status   Remaining deficits: See above   Education / Equipment: Level 1 HEP and balance  Plan: Patient agrees to discharge.  Patient goals were not met. Patient is being discharged due to not returning since the last visit.  ?????   Patient called and cancelled all appts she is at her sister's house now for a few weeks.  She was admitted to hospital but pt did not mention this as the reason she cancelled.    Will need new Rx to return.   JRaeford Razor PT 12/20/2014 12:24 PM Phone: [phone]: [phone]

## 2014-11-20 ENCOUNTER — Emergency Department (HOSPITAL_COMMUNITY): Payer: Medicare Other

## 2014-11-20 ENCOUNTER — Emergency Department (HOSPITAL_COMMUNITY)
Admission: EM | Admit: 2014-11-20 | Discharge: 2014-11-20 | Disposition: A | Discharge status: Home / Self Care | Payer: Medicare Other | Source: Home / Self Care | Attending: Emergency Medicine | Admitting: Emergency Medicine

## 2014-11-20 ENCOUNTER — Encounter (HOSPITAL_COMMUNITY): Payer: Self-pay | Admitting: Family Medicine

## 2014-11-20 DIAGNOSIS — E860 Dehydration: Secondary | ICD-10-CM | POA: Diagnosis not present

## 2014-11-20 DIAGNOSIS — A084 Viral intestinal infection, unspecified: Secondary | ICD-10-CM

## 2014-11-20 DIAGNOSIS — R1110 Vomiting, unspecified: Secondary | ICD-10-CM | POA: Diagnosis not present

## 2014-11-20 LAB — BASIC METABOLIC PANEL WITH GFR
Anion gap: 15 (ref 5–15)
BUN: 14 mg/dL (ref 6–20)
CO2: 24 mmol/L (ref 22–32)
Calcium: 9.8 mg/dL (ref 8.9–10.3)
Chloride: 96 mmol/L — ABNORMAL LOW (ref 101–111)
Creatinine, Ser: 1.36 mg/dL — ABNORMAL HIGH (ref 0.44–1.00)
GFR calc Af Amer: 47 mL/min — ABNORMAL LOW
GFR calc non Af Amer: 40 mL/min — ABNORMAL LOW
Glucose, Bld: 184 mg/dL — ABNORMAL HIGH (ref 65–99)
Potassium: 3.6 mmol/L (ref 3.5–5.1)
Sodium: 135 mmol/L (ref 135–145)

## 2014-11-20 LAB — I-STAT CG4 LACTIC ACID, ED
Lactic Acid, Venous: 2.06 mmol/L (ref 0.5–2.0)
Lactic Acid, Venous: 0.82 mmol/L (ref 0.5–2.0)

## 2014-11-20 LAB — URINE MICROSCOPIC-ADD ON

## 2014-11-20 LAB — HEPATIC FUNCTION PANEL
ALT: 29 U/L (ref 14–54)
AST: 34 U/L (ref 15–41)
Albumin: 4 g/dL (ref 3.5–5.0)
Alkaline Phosphatase: 83 U/L (ref 38–126)
Bilirubin, Direct: 0.2 mg/dL (ref 0.1–0.5)
Indirect Bilirubin: 0.5 mg/dL (ref 0.3–0.9)
Total Bilirubin: 0.7 mg/dL (ref 0.3–1.2)
Total Protein: 7.9 g/dL (ref 6.5–8.1)

## 2014-11-20 LAB — URINALYSIS, ROUTINE W REFLEX MICROSCOPIC
Bilirubin Urine: NEGATIVE
Glucose, UA: NEGATIVE mg/dL
Hgb urine dipstick: NEGATIVE
Ketones, ur: 15 mg/dL — AB
Nitrite: NEGATIVE
Protein, ur: NEGATIVE mg/dL
Specific Gravity, Urine: 1.012 (ref 1.005–1.030)
Urobilinogen, UA: 0.2 mg/dL (ref 0.0–1.0)
pH: 5.5 (ref 5.0–8.0)

## 2014-11-20 LAB — CBC WITH DIFFERENTIAL/PLATELET
Basophils Absolute: 0 K/uL (ref 0.0–0.1)
Basophils Relative: 0 %
Eosinophils Absolute: 0.2 K/uL (ref 0.0–0.7)
Eosinophils Relative: 2 %
HCT: 44.4 % (ref 36.0–46.0)
Hemoglobin: 15.7 g/dL — ABNORMAL HIGH (ref 12.0–15.0)
Lymphocytes Relative: 15 %
Lymphs Abs: 1.8 K/uL (ref 0.7–4.0)
MCH: 33.6 pg (ref 26.0–34.0)
MCHC: 35.4 g/dL (ref 30.0–36.0)
MCV: 95.1 fL (ref 78.0–100.0)
Monocytes Absolute: 0.8 K/uL (ref 0.1–1.0)
Monocytes Relative: 7 %
Neutro Abs: 9.2 K/uL — ABNORMAL HIGH (ref 1.7–7.7)
Neutrophils Relative %: 76 %
Platelets: 301 K/uL (ref 150–400)
RBC: 4.67 MIL/uL (ref 3.87–5.11)
RDW: 13.1 % (ref 11.5–15.5)
WBC: 12 K/uL — ABNORMAL HIGH (ref 4.0–10.5)

## 2014-11-20 LAB — LIPASE, BLOOD: Lipase: 40 U/L (ref 22–51)

## 2014-11-20 LAB — CBG MONITORING, ED: Glucose-Capillary: 191 mg/dL — ABNORMAL HIGH (ref 65–99)

## 2014-11-20 MED ORDER — ONDANSETRON HCL 4 MG/2ML IJ SOLN
4.0000 mg | Freq: Once | INTRAMUSCULAR | Status: AC
  Administered 2014-11-20: 4 mg via INTRAVENOUS
  Filled 2014-11-20: qty 2

## 2014-11-20 MED ORDER — IOHEXOL 300 MG/ML  SOLN: 25.0000 mL | Freq: Once | INTRAMUSCULAR | Status: DC | PRN

## 2014-11-20 MED ORDER — SODIUM CHLORIDE 0.9 % IV BOLUS (SEPSIS)
1000.0000 mL | Freq: Once | INTRAVENOUS | Status: AC
  Administered 2014-11-20: 1000 mL via INTRAVENOUS

## 2014-11-20 MED ORDER — PROCHLORPERAZINE EDISYLATE 5 MG/ML IJ SOLN
5.0000 mg | Freq: Once | INTRAMUSCULAR | Status: AC
  Administered 2014-11-20: 5 mg via INTRAVENOUS
  Filled 2014-11-20: qty 2

## 2014-11-20 MED ORDER — ONDANSETRON HCL 4 MG PO TABS: 4.0000 mg | ORAL_TABLET | Freq: Four times a day (QID) | ORAL | Status: DC

## 2014-11-20 MED ORDER — IOHEXOL 300 MG/ML  SOLN
80.0000 mL | Freq: Once | INTRAMUSCULAR | Status: AC | PRN
  Administered 2014-11-20: 80 mL via INTRAVENOUS

## 2014-11-20 MED ORDER — PROMETHAZINE HCL 25 MG/ML IJ SOLN
12.5000 mg | Freq: Once | INTRAMUSCULAR | Status: AC
  Administered 2014-11-20: 12.5 mg via INTRAVENOUS
  Filled 2014-11-20: qty 1

## 2014-11-20 MED ORDER — MORPHINE SULFATE (PF) 2 MG/ML IV SOLN
2.0000 mg | Freq: Once | INTRAVENOUS | Status: AC
  Administered 2014-11-20: 2 mg via INTRAVENOUS
  Filled 2014-11-20: qty 1

## 2014-11-20 NOTE — Discharge Instructions (Signed)
Viral Gastroenteritis Viral gastroenteritis is also called stomach flu. This illness is caused by a certain type of germ (virus). It can cause sudden watery poop (diarrhea) and throwing up (vomiting). This can cause you to lose body fluids (dehydration). This illness usually lasts for 3 to 8 days. It usually goes away on its own. HOME CARE   Drink enough fluids to keep your pee (urine) clear or pale yellow. Drink small amounts of fluids often.  Ask your doctor how to replace body fluid losses (rehydration).  Avoid:  Foods high in sugar.  Alcohol.  Bubbly (carbonated) drinks.  Tobacco.  Juice.  Caffeine drinks.  Very hot or cold fluids.  Fatty, greasy foods.  Eating too much at one time.  Dairy products until 24 to 48 hours after your watery poop stops.  You may eat foods with active cultures (probiotics). They can be found in some yogurts and supplements.  Wash your hands well to avoid spreading the illness.  Only take medicines as told by your doctor. Do not give aspirin to children. Do not take medicines for watery poop (antidiarrheals).  Ask your doctor if you should keep taking your regular medicines.  Keep all doctor visits as told. GET HELP RIGHT AWAY IF:   You cannot keep fluids down.  You do not pee at least once every 6 to 8 hours.  You are short of breath.  You see blood in your poop or throw up. This may look like coffee grounds.  You have belly (abdominal) pain that gets worse or is just in one small spot (localized).  You keep throwing up or having watery poop.  You have a fever.  The patient is a child younger than 3 months, and he or she has a fever.  The patient is a child older than 3 months, and he or she has a fever and problems that do not go away.  The patient is a child older than 3 months, and he or she has a fever and problems that suddenly get worse.  The patient is a baby, and he or she has no tears when crying. MAKE SURE YOU:     Understand these instructions.  Will watch your condition.  Will get help right away if you are not doing well or get worse. Document Released: 08/01/2007 Document Revised: 05/07/2011 Document Reviewed: 11/29/2010 [facility] Patient Information 2015 [place], [place]. This information is not intended to replace advice given to you by your health care provider. Make sure you discuss any questions you have with your health care provider.  Take diabetic medications as prescribed. Follow diabetic diet. Take Zofran as needed for nausea. Return to the emergency department if you experience excessive vomiting, fever, excessive diarrhea, abdominal pain.

## 2014-11-20 NOTE — ED Notes (Signed)
PT drinking contrast.  

## 2014-11-20 NOTE — ED Notes (Signed)
Attempted to obtain urine for urinalysis and Pt stated she did not have to urinate at this time

## 2014-11-20 NOTE — ED Provider Notes (Addendum)
16:40-  after discharge, the patient told the nurse that she did not want to leave.   Face-to-face evaluation   History: Patient with vomiting and mild diarrhea since last evening. She also has crampy abdominal pain. No history of diverticulitis.  Physical exam: Obese, alert, calm, cooperative. Abdomen normal bowel sounds, soft, mild mid left epigastric and left periumbilical tenderness. No rebound tenderness.    Medications  iohexol (OMNIPAQUE) 300 MG/ML solution 25 mL (not administered)  ondansetron (ZOFRAN) injection 4 mg (4 mg Intravenous Given 11/20/14 1153)  sodium chloride 0.9 % bolus 1,000 mL (0 mLs Intravenous Stopped 11/20/14 1313)  sodium chloride 0.9 % bolus 1,000 mL (0 mLs Intravenous Stopped 11/20/14 1624)  ondansetron (ZOFRAN) injection 4 mg (4 mg Intravenous Given 11/20/14 1349)  morphine 2 MG/ML injection 2 mg (2 mg Intravenous Given 11/20/14 1349)  promethazine (PHENERGAN) injection 12.5 mg (12.5 mg Intravenous Given 11/20/14 1455)  sodium chloride 0.9 % bolus 1,000 mL (1,000 mLs Intravenous New Bag/Given 11/20/14 1707)  prochlorperazine (COMPAZINE) injection 5 mg (5 mg Intravenous Given 11/20/14 1707)  iohexol (OMNIPAQUE) 300 MG/ML solution 80 mL (80 mLs Intravenous Contrast Given 11/20/14 1832)    Patient Vitals for the past 24 hrs:  BP Temp Temp src Pulse Resp SpO2 Height Weight  11/20/14 1709 - 99.1 F (37.3 C) Rectal - - - - -  11/20/14 1345 170/86 mmHg - - 96 - 98 % - -  11/20/14 1315 147/88 mmHg - - 99 - 96 % - -  11/20/14 1245 160/88 mmHg - - 99 - 97 % - -  11/20/14 1215 171/76 mmHg - - 98 - 96 % - -  11/20/14 1145 158/87 mmHg - - 100 - 96 % - -  11/20/14 1115 145/92 mmHg - - 101 - 97 % - -  11/20/14 1110 147/93 mmHg 97.2 F (36.2 C) Oral 100 18 99 %  (1.549 m) 215 lb (97.523 kg)   Results for orders placed or performed during the hospital encounter of 11/20/14  Basic metabolic panel  Result Value Ref Range   Sodium 135 135 - 145 mmol/L   Potassium 3.6  3.5 - 5.1 mmol/L   Chloride 96 (L) 101 - 111 mmol/L   CO2 24 22 - 32 mmol/L   Glucose, Bld 184 (H) 65 - 99 mg/dL   BUN 14 6 - 20 mg/dL   Creatinine, Ser 1.61 (H) 0.44 - 1.00 mg/dL   Calcium 9.8 8.9 - 09.6 mg/dL   GFR calc non Af Amer 40 (L) >60 mL/min   GFR calc Af Amer 47 (L) >60 mL/min   Anion gap 15 5 - 15  CBC with Differential  Result Value Ref Range   WBC 12.0 (H) 4.0 - 10.5 K/uL   RBC 4.67 3.87 - 5.11 MIL/uL   Hemoglobin 15.7 (H) 12.0 - 15.0 g/dL   HCT 04.5 40.9 - 81.1 %   MCV 95.1 78.0 - 100.0 fL   MCH 33.6 26.0 - 34.0 pg   MCHC 35.4 30.0 - 36.0 g/dL   RDW 91.4 78.2 - 95.6 %   Platelets 301 150 - 400 K/uL   Neutrophils Relative % 76 %   Neutro Abs 9.2 (H) 1.7 - 7.7 K/uL   Lymphocytes Relative 15 %   Lymphs Abs 1.8 0.7 - 4.0 K/uL   Monocytes Relative 7 %   Monocytes Absolute 0.8 0.1 - 1.0 K/uL   Eosinophils Relative 2 %   Eosinophils Absolute 0.2 0.0 - 0.7 K/uL  Basophils Relative 0 %   Basophils Absolute 0.0 0.0 - 0.1 K/uL  Urinalysis, Routine w reflex microscopic  Result Value Ref Range   Color, Urine YELLOW YELLOW   APPearance CLOUDY (A) CLEAR   Specific Gravity, Urine 1.012 1.005 - 1.030   pH 5.5 5.0 - 8.0   Glucose, UA NEGATIVE NEGATIVE mg/dL   Hgb urine dipstick NEGATIVE NEGATIVE   Bilirubin Urine NEGATIVE NEGATIVE   Ketones, ur 15 (A) NEGATIVE mg/dL   Protein, ur NEGATIVE NEGATIVE mg/dL   Urobilinogen, UA 0.2 0.0 - 1.0 mg/dL   Nitrite NEGATIVE NEGATIVE   Leukocytes, UA TRACE (A) NEGATIVE  Lipase, blood  Result Value Ref Range   Lipase 40 22 - 51 U/L  Hepatic function panel  Result Value Ref Range   Total Protein 7.9 6.5 - 8.1 g/dL   Albumin 4.0 3.5 - 5.0 g/dL   AST 34 15 - 41 U/L   ALT 29 14 - 54 U/L   Alkaline Phosphatase 83 38 - 126 U/L   Total Bilirubin 0.7 0.3 - 1.2 mg/dL   Bilirubin, Direct 0.2 0.1 - 0.5 mg/dL   Indirect Bilirubin 0.5 0.3 - 0.9 mg/dL  Urine microscopic-add on  Result Value Ref Range   Squamous Epithelial / LPF FEW (A)  RARE   WBC, UA 3-6 <3 WBC/hpf  POC CBG, ED  Result Value Ref Range   Glucose-Capillary 191 (H) 65 - 99 mg/dL  I-Stat CG4 Lactic Acid, ED  Result Value Ref Range   Lactic Acid, Venous 2.06 (HH) 0.5 - 2.0 mmol/L   Comment NOTIFIED PHYSICIAN   I-Stat CG4 Lactic Acid, ED  Result Value Ref Range   Lactic Acid, Venous 0.82 0.5 - 2.0 mmol/L   Ct Abdomen Pelvis W Contrast  11/20/2014   CLINICAL DATA:  Abdominal pain for 2-3 days with diarrhea  EXAM: CT ABDOMEN AND PELVIS WITH CONTRAST  TECHNIQUE: Multidetector CT imaging of the abdomen and pelvis was performed using the standard protocol following bolus administration of intravenous contrast.  CONTRAST:  80mL OMNIPAQUE IOHEXOL 300 MG/ML  SOLN  COMPARISON:  None.  FINDINGS: Lung bases are free of acute infiltrate or sizable effusion.  The gallbladder is well distended and demonstrates a few dependent gallstones. No complicating factors are noted. The liver, spleen, adrenal glands and pancreas are otherwise within normal limits. The kidneys are well visualized bilaterally within normal enhancement pattern. Normal excretion is noted bilaterally.  The appendix is not well visualized although no inflammatory changes are seen. A small fat containing umbilical hernia is noted. The bladder is partially distended. No pelvic mass lesion or sidewall abnormality is noted. Chronic degenerative changes of both hip joints are seen right greater than left. Degenerative changes of the lumbar spine are noted.  IMPRESSION: Cholelithiasis without complicating factors.  Fat containing umbilical hernia.  No other focal abnormality   Electronically Signed   By: Alcide Clever M.D.   On: 11/20/2014 19:04    7:19 PM Reevaluation with update and discussion. After initial assessment and treatment, an updated evaluation reveals she is comfortable now, having tolerated the oral contrast, without vomiting. Findings discussed with patient and her sister who is here with her. Patient wanted  to stay overnight because she "lives alone". I explained to her that there were no medical indications for staying here at this time and that she should do fine at home with a clear liquid diet, gradually advancing to solid foods, over one or 2 days. She is encouraged to follow-up  with her PCP as needed for problems. Return here if needed. WENTZ,ELLIOTT L     Medical screening examination/treatment/procedure(s) were conducted as a shared visit with non-physician practitioner(s) and myself.  I personally evaluated the patient during the encounter    Mancel Bale, MD 11/20/14 [phone]

## 2014-11-20 NOTE — ED Notes (Signed)
Dr. Wentz at bedside. 

## 2014-11-20 NOTE — ED Notes (Signed)
Pt notified of discharge.  She states she does not feel any better than when she arrived and she is afraid to go home by herself.  Dr Effie Shy notified.

## 2014-11-20 NOTE — ED Notes (Signed)
Pt stable, ambulatory, states understanding of discharge instructions 

## 2014-11-20 NOTE — ED Notes (Addendum)
Pt presents from home via POV with c/o right-sided abdominal pain that began this morning at 0200.  Pt reports no vomiting, but endorses diarrhea x2, and continued nausea. Pt took promethazine  PO at 0830 with minimal relief.

## 2014-11-20 NOTE — ED Provider Notes (Signed)
CSN: [id number]     Arrival date & time 11/20/14  1046/03/30 History   First MD Initiated Contact with Patient 11/20/14 03/30/57     Chief Complaint  Patient presents with  . Abdominal Pain     (Consider location/radiation/quality/duration/timing/severity/associated sxs/prior Treatment) HPI Comments: Jodi Chavez is a 64 y.o F with a pmhx of DM who presents the emergency department today complaining of nausea and diarrhea. Patient states that last night she ate biscuits and gravy for dinner. 30 minutes later she began to feel nauseous and she laid down. Patient then woke up around 2 AM due to nausea and began to experience diarrhea, nonbloody nonbilious. Denies vomiting. Patient was seen in [facility] earlier today but came to the emergency department because she felt weak while ambulating. Patient has not taken diabetic medications since last night Because she has not had anything to eat. Patient has felt like this before and usually takes promethazine which resolves her symptoms however today the promethazine did not provide relief. Denies fever, chills, chest pain, numbness, headache, blurry vision, recent illness.   Patient is a 64 y.o. female presenting with abdominal pain. The history is provided by the patient.  Abdominal Pain   Past Medical History  Diagnosis Date  . PONV (postoperative nausea and vomiting)   . Hypertension     takes Benazepril nightly and Propranolol tid and Clonidine daily  . Hyperlipidemia     takes Zetia and Zocor daily  . History of bronchitis     last time several yrs ago  . History of migraine     many yrs ago  . Dizziness     occasionally and related to meds   . Peripheral edema   . Peripheral neuropathy   . Arthritis     hip  . Low back pain   . Constipation     uses laxatives several times a week  . Slow urinary stream     occasionally  . Diabetes mellitus     takes Metformin and Amaryl daily  . Early cataracts, bilateral   . Depression   . Insomnia     Past Surgical History  Procedure Laterality Date  . 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 D&c/hysteroscopy/ablation    . Dilation and curettage of uterus      couple of times  . Colonoscopy    . Esophagogastroduodenoscopy    . Posterior cervical fusion/foraminotomy  10/15/2011    Procedure: POSTERIOR CERVICAL FUSION/FORAMINOTOMY LEVEL 2;  Surgeon: Jessy Oto, MD;  Location: [place];  Service: Orthopedics;  Laterality: N/A;  Right C6-7, C7-T1 Foraminotomy with excision HNP C7-T1  . Upper gastrointestinal endoscopy     Family History  Problem Relation Age of Onset  . Cancer Mother   . Stroke Mother   . Diabetes Mother   . Hypertension Mother   . Colon cancer Mother 39    died 03/30/05  . Gout Father   . Heart disease Father    Social History  Substance Use Topics  . Smoking status: Never Smoker   . Smokeless tobacco: Never Used  . Alcohol Use: No   OB History    No data available     Review of Systems  Gastrointestinal: Positive for abdominal pain.  All other systems reviewed and are negative.     Allergies  Naproxen sodium  Home Medications   Prior to Admission medications   Medication Sig Start Date End Date Taking? Authorizing  Provider  acetaminophen (TYLENOL) 500 MG tablet Take 500 mg by mouth every 6 (six) hours as needed for pain.    Historical Provider, MD  benazepril (LOTENSIN) 40 MG tablet Take 40 mg by mouth every evening.     Historical Provider, MD  calcium-vitamin D (OSCAL WITH D) 500-200 MG-UNIT per tablet Take 1 tablet by mouth daily.     Historical Provider, MD  cloNIDine (CATAPRES) 0.2 MG tablet Take 0.1 mg by mouth 2 (two) times daily.    Historical Provider, MD  diphenhydrAMINE (BENADRYL) 25 MG tablet Take 25 mg by mouth every 6 (six) hours as needed for itching.    Historical Provider, MD  doxepin (SINEQUAN) 50 MG capsule Take 100 mg by mouth at bedtime.    Historical Provider, MD  ezetimibe (ZETIA) 10 MG tablet Take  10 mg by mouth daily.    Historical Provider, MD  fluticasone Asencion Islam) 50 MCG/ACT nasal spray  06/11/14   Historical Provider, MD  gabapentin (NEURONTIN) 300 MG capsule Take 300 mg by mouth 3 (three) times daily.    Historical Provider, MD  Garlic 6378 MG CAPS Take 1 capsule by mouth 3 (three) times daily.    Historical Provider, MD  glimepiride (AMARYL) 2 MG tablet Take 2 mg by mouth daily before breakfast.    Historical Provider, MD  Glucosamine-Chondroit-Vit C-Mn (GLUCOSAMINE CHONDR 1500 COMPLX PO) Take by mouth.    Historical Provider, MD  hydrochlorothiazide (HYDRODIURIL) 25 MG tablet Take 25 mg by mouth daily.    Historical Provider, MD  loratadine (CLARITIN) 10 MG tablet Take 10 mg by mouth daily.    Historical Provider, MD  meloxicam (MOBIC) 7.5 MG tablet Take 7.5 mg by mouth 2 (two) times daily as needed for pain.    Historical Provider, MD  metFORMIN (GLUCOPHAGE) 850 MG tablet Take 850 mg by mouth daily with breakfast.     Historical Provider, MD  methocarbamol (ROBAXIN) 500 MG tablet Take 1 tablet (500 mg total) by mouth 3 (three) times daily. 10/19/11 10/29/11  Jessy Oto, MD  MOVIPREP 100 G SOLR Take 1 kit (100 g total) by mouth once. moviprep as directed. No substitutions 08/11/12   Jerene Bears, MD  Multiple Vitamin (MULITIVITAMIN WITH MINERALS) TABS Take 1 tablet by mouth daily.    Historical Provider, MD  OVER THE COUNTER MEDICATION Apply 1 application topically daily. sween cream applied to groin area daily to prevent rash    Historical Provider, MD  oxyCODONE (OXY IR/ROXICODONE) 5 MG immediate release tablet Take 1 tablet (5 mg total) by mouth every 4 (four) hours as needed for pain. 10/19/11 10/29/11  Jessy Oto, MD  PROAIR HFA 108 (90 BASE) MCG/ACT inhaler  06/11/14   Historical Provider, MD  promethazine (PHENERGAN) 25 MG tablet Take 25 mg by mouth every 6 (six) hours as needed for nausea.    Historical Provider, MD  propranolol (INDERAL) 10 MG tablet Take 10 mg by mouth 3  (three) times daily.    Historical Provider, MD  Sennosides 25 MG TABS Take 2 tablets by mouth as needed. Pt takes every other day to every 3 days    Historical Provider, MD  simvastatin (ZOCOR) 40 MG tablet Take 40 mg by mouth every evening.    Historical Provider, MD  traMADol (ULTRAM) 50 MG tablet  08/20/14   Historical Provider, MD   BP 147/93 mmHg  Pulse 100  Temp(Src) 97.2 F (36.2 C) (Oral)  Resp 18  Ht 5' 1" (1.549 m)  Wt 215 lb (97.523 kg)  BMI 40.64 kg/m2  SpO2 99% Physical Exam  Constitutional: She is oriented to person, place, and time. She appears well-developed and well-nourished. No distress.  HENT:  Head: Normocephalic and atraumatic.  Mouth/Throat: Oropharynx is clear and moist. No oropharyngeal exudate.  Eyes: Conjunctivae and EOM are normal. Pupils are equal, round, and reactive to light. Right eye exhibits no discharge. Left eye exhibits no discharge. No scleral icterus.  Neck: Normal range of motion. Neck supple.  Cardiovascular: Normal rate, regular rhythm, normal heart sounds and intact distal pulses.  Exam reveals no gallop and no friction rub.   No murmur heard. Pulmonary/Chest: Effort normal and breath sounds normal. No respiratory distress. She has no wheezes. She has no rales. She exhibits no tenderness.  Abdominal: Soft. Bowel sounds are normal. She exhibits no distension and no mass. There is tenderness ( Mild diffuse tenderness of abdomen. Primarily periumbilical. No peritoneal signs. ). There is no rebound and no guarding.  Musculoskeletal: Normal range of motion. She exhibits no edema or tenderness.  Lymphadenopathy:    She has no cervical adenopathy.  Neurological: She is alert and oriented to person, place, and time. No cranial nerve deficit.  Strength 5/5 throughout. No sensory deficits.    Skin: Skin is warm and dry. No rash noted. She is not diaphoretic. No erythema. No pallor.  Psychiatric: She has a normal mood and affect.  Nursing note and  vitals reviewed.   ED Course  Procedures (including critical care time) Labs Review Labs Reviewed  BASIC METABOLIC PANEL - Abnormal; Notable for the following:    Chloride 96 (*)    Glucose, Bld 184 (*)    Creatinine, Ser 1.36 (*)    GFR calc non Af Amer 40 (*)    GFR calc Af Amer 47 (*)    All other components within normal limits  CBC WITH DIFFERENTIAL/PLATELET - Abnormal; Notable for the following:    WBC 12.0 (*)    Hemoglobin 15.7 (*)    Neutro Abs 9.2 (*)    All other components within normal limits  CBG MONITORING, ED - Abnormal; Notable for the following:    Glucose-Capillary 191 (*)    All other components within normal limits  I-STAT CG4 LACTIC ACID, ED - Abnormal; Notable for the following:    Lactic Acid, Venous 2.06 (*)    All other components within normal limits  LIPASE, BLOOD  HEPATIC FUNCTION PANEL  URINALYSIS, ROUTINE W REFLEX MICROSCOPIC (NOT AT [facility])    Imaging Review No results found. I have personally reviewed and evaluated these images and lab results as part of my medical decision-making.   EKG Interpretation None      MDM   Final diagnoses:  Viral gastroenteritis    Pt seen for nausea and diarrhea that began last night. Today pt feels weak. Has not taken diabetic medication. Did not check glucose at home. Will r/o DKA. Pt given 1L fluids and zofran. Pending labs. Do not suspect acute abdomen. Possibly viral.    Lactate 2.03. We'll administer another bolus of fluid and recheck.  Vital signs stable.   Repeat lactate after IV fluids is 0.82. Patient stable for discharge. Recommend follow-up with PCP. Advised appropriate diabetic diet. Take diabetic medications as prescribed. Check glucose at home daily. Return precautions outlined in patient discharge instructions.   Dondra Spry [place], PA-C 11/20/14 1538  Sherwood Gambler, MD 11/23/14 [phone]

## 2014-11-21 ENCOUNTER — Encounter (HOSPITAL_COMMUNITY): Payer: Self-pay | Admitting: Emergency Medicine

## 2014-11-21 ENCOUNTER — Inpatient Hospital Stay (HOSPITAL_COMMUNITY)
Admission: EM | Admit: 2014-11-21 | Discharge: 2014-11-25 | DRG: 641 | Disposition: A | Discharge status: Home Health | Payer: Medicare Other | Attending: Internal Medicine | Admitting: Internal Medicine

## 2014-11-21 ENCOUNTER — Inpatient Hospital Stay (HOSPITAL_COMMUNITY): Payer: Medicare Other

## 2014-11-21 DIAGNOSIS — J302 Other seasonal allergic rhinitis: Secondary | ICD-10-CM | POA: Diagnosis present

## 2014-11-21 DIAGNOSIS — Z6839 Body mass index (BMI) 39.0-39.9, adult: Secondary | ICD-10-CM | POA: Diagnosis not present

## 2014-11-21 DIAGNOSIS — Z823 Family history of stroke: Secondary | ICD-10-CM | POA: Diagnosis not present

## 2014-11-21 DIAGNOSIS — E872 Acidosis: Secondary | ICD-10-CM | POA: Diagnosis present

## 2014-11-21 DIAGNOSIS — F329 Major depressive disorder, single episode, unspecified: Secondary | ICD-10-CM | POA: Diagnosis present

## 2014-11-21 DIAGNOSIS — K3184 Gastroparesis: Secondary | ICD-10-CM

## 2014-11-21 DIAGNOSIS — N179 Acute kidney failure, unspecified: Secondary | ICD-10-CM | POA: Diagnosis not present

## 2014-11-21 DIAGNOSIS — R1110 Vomiting, unspecified: Secondary | ICD-10-CM

## 2014-11-21 DIAGNOSIS — N1832 Chronic kidney disease, stage 3b: Secondary | ICD-10-CM | POA: Diagnosis present

## 2014-11-21 DIAGNOSIS — Z800 Family history of malignant neoplasm of digestive organs: Secondary | ICD-10-CM | POA: Diagnosis not present

## 2014-11-21 DIAGNOSIS — R05 Cough: Secondary | ICD-10-CM | POA: Diagnosis not present

## 2014-11-21 DIAGNOSIS — E860 Dehydration: Principal | ICD-10-CM | POA: Diagnosis present

## 2014-11-21 DIAGNOSIS — E1140 Type 2 diabetes mellitus with diabetic neuropathy, unspecified: Secondary | ICD-10-CM | POA: Diagnosis present

## 2014-11-21 DIAGNOSIS — E785 Hyperlipidemia, unspecified: Secondary | ICD-10-CM | POA: Diagnosis present

## 2014-11-21 DIAGNOSIS — G629 Polyneuropathy, unspecified: Secondary | ICD-10-CM | POA: Diagnosis present

## 2014-11-21 DIAGNOSIS — K8020 Calculus of gallbladder without cholecystitis without obstruction: Secondary | ICD-10-CM | POA: Diagnosis present

## 2014-11-21 DIAGNOSIS — M4722 Other spondylosis with radiculopathy, cervical region: Secondary | ICD-10-CM | POA: Diagnosis present

## 2014-11-21 DIAGNOSIS — E119 Type 2 diabetes mellitus without complications: Secondary | ICD-10-CM

## 2014-11-21 DIAGNOSIS — G43A1 Cyclical vomiting, intractable: Secondary | ICD-10-CM | POA: Diagnosis not present

## 2014-11-21 DIAGNOSIS — Z794 Long term (current) use of insulin: Secondary | ICD-10-CM

## 2014-11-21 DIAGNOSIS — R1013 Epigastric pain: Secondary | ICD-10-CM | POA: Diagnosis present

## 2014-11-21 DIAGNOSIS — R112 Nausea with vomiting, unspecified: Secondary | ICD-10-CM | POA: Diagnosis not present

## 2014-11-21 DIAGNOSIS — Z79899 Other long term (current) drug therapy: Secondary | ICD-10-CM

## 2014-11-21 DIAGNOSIS — E669 Obesity, unspecified: Secondary | ICD-10-CM | POA: Diagnosis present

## 2014-11-21 DIAGNOSIS — M1610 Unilateral primary osteoarthritis, unspecified hip: Secondary | ICD-10-CM | POA: Diagnosis present

## 2014-11-21 DIAGNOSIS — R059 Cough, unspecified: Secondary | ICD-10-CM | POA: Diagnosis present

## 2014-11-21 DIAGNOSIS — M5020 Other cervical disc displacement, unspecified cervical region: Secondary | ICD-10-CM

## 2014-11-21 DIAGNOSIS — Z8249 Family history of ischemic heart disease and other diseases of the circulatory system: Secondary | ICD-10-CM | POA: Diagnosis not present

## 2014-11-21 DIAGNOSIS — Z791 Long term (current) use of non-steroidal anti-inflammatories (NSAID): Secondary | ICD-10-CM | POA: Diagnosis not present

## 2014-11-21 DIAGNOSIS — I10 Essential (primary) hypertension: Secondary | ICD-10-CM | POA: Diagnosis not present

## 2014-11-21 DIAGNOSIS — N390 Urinary tract infection, site not specified: Secondary | ICD-10-CM | POA: Diagnosis present

## 2014-11-21 DIAGNOSIS — F32A Depression, unspecified: Secondary | ICD-10-CM | POA: Diagnosis present

## 2014-11-21 DIAGNOSIS — A419 Sepsis, unspecified organism: Secondary | ICD-10-CM | POA: Diagnosis present

## 2014-11-21 DIAGNOSIS — N183 Chronic kidney disease, stage 3 unspecified: Secondary | ICD-10-CM | POA: Diagnosis present

## 2014-11-21 DIAGNOSIS — Z833 Family history of diabetes mellitus: Secondary | ICD-10-CM

## 2014-11-21 LAB — CBC
HEMATOCRIT: 41.5 % (ref 36.0–46.0)
HEMOGLOBIN: 14.3 g/dL (ref 12.0–15.0)
MCH: 32.9 pg (ref 26.0–34.0)
MCHC: 34.5 g/dL (ref 30.0–36.0)
MCV: 95.4 fL (ref 78.0–100.0)
Platelets: 281 10*3/uL (ref 150–400)
RBC: 4.35 MIL/uL (ref 3.87–5.11)
RDW: 13.2 % (ref 11.5–15.5)
WBC: 12.4 10*3/uL — ABNORMAL HIGH (ref 4.0–10.5)

## 2014-11-21 LAB — PROTIME-INR
INR: 1.13 (ref 0.00–1.49)
Prothrombin Time: 14.7 seconds (ref 11.6–15.2)

## 2014-11-21 LAB — COMPREHENSIVE METABOLIC PANEL
ALBUMIN: 3.7 g/dL (ref 3.5–5.0)
ALK PHOS: 75 U/L (ref 38–126)
ALT: 24 U/L (ref 14–54)
ANION GAP: 14 (ref 5–15)
AST: 28 U/L (ref 15–41)
BUN: 11 mg/dL (ref 6–20)
CO2: 22 mmol/L (ref 22–32)
Calcium: 8.8 mg/dL — ABNORMAL LOW (ref 8.9–10.3)
Chloride: 101 mmol/L (ref 101–111)
Creatinine, Ser: 1.2 mg/dL — ABNORMAL HIGH (ref 0.44–1.00)
GFR calc Af Amer: 55 mL/min — ABNORMAL LOW (ref >60)
GFR calc non Af Amer: 47 mL/min — ABNORMAL LOW (ref >60)
GLUCOSE: 173 mg/dL — AB (ref 65–99)
POTASSIUM: 3.5 mmol/L (ref 3.5–5.1)
SODIUM: 137 mmol/L (ref 135–145)
Total Bilirubin: 0.9 mg/dL (ref 0.3–1.2)
Total Protein: 7.2 g/dL (ref 6.5–8.1)

## 2014-11-21 LAB — GLUCOSE, CAPILLARY: GLUCOSE-CAPILLARY: 164 mg/dL — AB (ref 65–99)

## 2014-11-21 LAB — URINE MICROSCOPIC-ADD ON

## 2014-11-21 LAB — URINALYSIS, ROUTINE W REFLEX MICROSCOPIC
Bilirubin Urine: NEGATIVE
GLUCOSE, UA: NEGATIVE mg/dL
Ketones, ur: >80 mg/dL — AB
Nitrite: NEGATIVE
Protein, ur: 100 mg/dL — AB
SPECIFIC GRAVITY, URINE: 1.016 (ref 1.005–1.030)
Urobilinogen, UA: 0.2 mg/dL (ref 0.0–1.0)
pH: 6 (ref 5.0–8.0)

## 2014-11-21 LAB — LIPASE, BLOOD: Lipase: 33 U/L (ref 22–51)

## 2014-11-21 LAB — LACTIC ACID, PLASMA: Lactic Acid, Venous: 1.5 mmol/L (ref 0.5–2.0)

## 2014-11-21 LAB — APTT: APTT: 27 s (ref 24–37)

## 2014-11-21 MED ORDER — DEXTROSE 5 % IV SOLN
1.0000 g | Freq: Once | INTRAVENOUS | Status: AC
  Administered 2014-11-21: 1 g via INTRAVENOUS
  Filled 2014-11-21: qty 10

## 2014-11-21 MED ORDER — CALCIUM CARBONATE-VITAMIN D 500-200 MG-UNIT PO TABS
1.0000 | ORAL_TABLET | Freq: Every day | ORAL | Status: DC
  Administered 2014-11-22 – 2014-11-25 (×4): 1 via ORAL
  Filled 2014-11-21 (×4): qty 1

## 2014-11-21 MED ORDER — PROPRANOLOL HCL 10 MG PO TABS
10.0000 mg | ORAL_TABLET | Freq: Three times a day (TID) | ORAL | Status: DC
  Administered 2014-11-21 – 2014-11-25 (×11): 10 mg via ORAL
  Filled 2014-11-21 (×16): qty 1

## 2014-11-21 MED ORDER — SODIUM CHLORIDE 0.9 % IV BOLUS (SEPSIS)
1000.0000 mL | Freq: Once | INTRAVENOUS | Status: AC
  Administered 2014-11-21: 1000 mL via INTRAVENOUS

## 2014-11-21 MED ORDER — CLONIDINE HCL 0.1 MG PO TABS
0.1000 mg | ORAL_TABLET | Freq: Two times a day (BID) | ORAL | Status: DC
  Administered 2014-11-21 – 2014-11-25 (×8): 0.1 mg via ORAL
  Filled 2014-11-21 (×8): qty 1

## 2014-11-21 MED ORDER — LORAZEPAM 2 MG/ML IJ SOLN
1.0000 mg | Freq: Once | INTRAMUSCULAR | Status: AC
  Administered 2014-11-21: 1 mg via INTRAVENOUS
  Filled 2014-11-21: qty 1

## 2014-11-21 MED ORDER — DM-GUAIFENESIN ER 30-600 MG PO TB12
1.0000 | ORAL_TABLET | Freq: Two times a day (BID) | ORAL | Status: DC
  Administered 2014-11-21 – 2014-11-25 (×8): 1 via ORAL
  Filled 2014-11-21 (×8): qty 1

## 2014-11-21 MED ORDER — SENNA 8.6 MG PO TABS: 2.0000 | ORAL_TABLET | ORAL | Status: DC | PRN

## 2014-11-21 MED ORDER — DOXEPIN HCL 100 MG PO CAPS
100.0000 mg | ORAL_CAPSULE | Freq: Every day | ORAL | Status: DC
  Administered 2014-11-22 – 2014-11-25 (×3): 100 mg via ORAL
  Filled 2014-11-21 (×7): qty 1

## 2014-11-21 MED ORDER — FLUTICASONE PROPIONATE 50 MCG/ACT NA SUSP
1.0000 | Freq: Every day | NASAL | Status: DC
  Administered 2014-11-22 – 2014-11-25 (×3): 1 via NASAL
  Filled 2014-11-21 (×2): qty 16

## 2014-11-21 MED ORDER — ACETAMINOPHEN 325 MG PO TABS: 650.0000 mg | ORAL_TABLET | Freq: Four times a day (QID) | ORAL | Status: DC | PRN

## 2014-11-21 MED ORDER — PIPERACILLIN-TAZOBACTAM 3.375 G IVPB
3.3750 g | Freq: Three times a day (TID) | INTRAVENOUS | Status: DC
  Administered 2014-11-21 – 2014-11-22 (×3): 3.375 g via INTRAVENOUS
  Filled 2014-11-21 (×5): qty 50

## 2014-11-21 MED ORDER — LORAZEPAM 2 MG/ML IJ SOLN
INTRAMUSCULAR | Status: AC
  Filled 2014-11-21: qty 1

## 2014-11-21 MED ORDER — SODIUM CHLORIDE 0.9 % IV SOLN
INTRAVENOUS | Status: DC
  Administered 2014-11-21 – 2014-11-23 (×2): via INTRAVENOUS

## 2014-11-21 MED ORDER — LORATADINE 10 MG PO TABS
10.0000 mg | ORAL_TABLET | Freq: Every day | ORAL | Status: DC
  Administered 2014-11-22 – 2014-11-25 (×4): 10 mg via ORAL
  Filled 2014-11-21 (×4): qty 1

## 2014-11-21 MED ORDER — GLUCOSAMINE CHONDR 1500 COMPLX PO CAPS: 1.0000 | ORAL_CAPSULE | Freq: Every day | ORAL | Status: DC

## 2014-11-21 MED ORDER — VANCOMYCIN HCL IN DEXTROSE 750-5 MG/150ML-% IV SOLN
750.0000 mg | Freq: Two times a day (BID) | INTRAVENOUS | Status: DC
  Administered 2014-11-22: 750 mg via INTRAVENOUS
  Filled 2014-11-21 (×2): qty 150

## 2014-11-21 MED ORDER — VANCOMYCIN HCL 10 G IV SOLR
1500.0000 mg | Freq: Once | INTRAVENOUS | Status: AC
  Administered 2014-11-21: 1500 mg via INTRAVENOUS
  Filled 2014-11-21: qty 1500

## 2014-11-21 MED ORDER — METHOCARBAMOL 500 MG PO TABS
500.0000 mg | ORAL_TABLET | Freq: Three times a day (TID) | ORAL | Status: DC
  Administered 2014-11-21 – 2014-11-25 (×11): 500 mg via ORAL
  Filled 2014-11-21 (×11): qty 1

## 2014-11-21 MED ORDER — PANTOPRAZOLE SODIUM 40 MG IV SOLR
40.0000 mg | Freq: Two times a day (BID) | INTRAVENOUS | Status: DC
  Administered 2014-11-21 – 2014-11-23 (×4): 40 mg via INTRAVENOUS
  Filled 2014-11-21 (×6): qty 40

## 2014-11-21 MED ORDER — ALBUTEROL SULFATE (2.5 MG/3ML) 0.083% IN NEBU: 2.5000 mg | INHALATION_SOLUTION | RESPIRATORY_TRACT | Status: DC | PRN

## 2014-11-21 MED ORDER — SIMVASTATIN 40 MG PO TABS
40.0000 mg | ORAL_TABLET | Freq: Every evening | ORAL | Status: DC
Start: 2014-11-21 — End: 2014-11-26
  Administered 2014-11-22 – 2014-11-25 (×4): 40 mg via ORAL
  Filled 2014-11-21 (×4): qty 1

## 2014-11-21 MED ORDER — ONDANSETRON HCL 4 MG/2ML IJ SOLN
4.0000 mg | Freq: Once | INTRAMUSCULAR | Status: AC
  Administered 2014-11-21: 4 mg via INTRAVENOUS
  Filled 2014-11-21: qty 2

## 2014-11-21 MED ORDER — DIPHENHYDRAMINE HCL 25 MG PO CAPS
25.0000 mg | ORAL_CAPSULE | Freq: Four times a day (QID) | ORAL | Status: DC | PRN
  Administered 2014-11-21 – 2014-11-22 (×2): 25 mg via ORAL
  Filled 2014-11-21 (×2): qty 1

## 2014-11-21 MED ORDER — GARLIC 1000 MG PO CAPS: 1.0000 | ORAL_CAPSULE | Freq: Three times a day (TID) | ORAL | Status: DC

## 2014-11-21 MED ORDER — ADULT MULTIVITAMIN W/MINERALS CH
1.0000 | ORAL_TABLET | Freq: Every day | ORAL | Status: DC
  Administered 2014-11-22 – 2014-11-25 (×4): 1 via ORAL
  Filled 2014-11-21 (×4): qty 1

## 2014-11-21 MED ORDER — LORAZEPAM 2 MG/ML IJ SOLN: 1.0000 mg | Freq: Once | INTRAMUSCULAR | Status: DC

## 2014-11-21 MED ORDER — HYDRALAZINE HCL 20 MG/ML IJ SOLN: 5.0000 mg | INTRAMUSCULAR | Status: DC | PRN

## 2014-11-21 MED ORDER — HEPARIN SODIUM (PORCINE) 5000 UNIT/ML IJ SOLN
5000.0000 [IU] | Freq: Three times a day (TID) | INTRAMUSCULAR | Status: DC
  Administered 2014-11-21 – 2014-11-23 (×5): 5000 [IU] via SUBCUTANEOUS
  Filled 2014-11-21 (×4): qty 1

## 2014-11-21 MED ORDER — ONDANSETRON HCL 4 MG/2ML IJ SOLN
4.0000 mg | Freq: Three times a day (TID) | INTRAMUSCULAR | Status: DC | PRN
  Administered 2014-11-21 – 2014-11-24 (×3): 4 mg via INTRAVENOUS
  Filled 2014-11-21 (×3): qty 2

## 2014-11-21 MED ORDER — LORAZEPAM 2 MG/ML IJ SOLN
1.0000 mg | Freq: Once | INTRAMUSCULAR | Status: AC
  Administered 2014-11-21: 1 mg via INTRAVENOUS

## 2014-11-21 MED ORDER — TRAMADOL HCL 50 MG PO TABS
50.0000 mg | ORAL_TABLET | Freq: Four times a day (QID) | ORAL | Status: DC | PRN
Start: 2014-11-21 — End: 2014-11-26
  Administered 2014-11-22 – 2014-11-25 (×5): 50 mg via ORAL
  Filled 2014-11-21 (×5): qty 1

## 2014-11-21 MED ORDER — EZETIMIBE 10 MG PO TABS
10.0000 mg | ORAL_TABLET | Freq: Every day | ORAL | Status: DC
  Administered 2014-11-22 – 2014-11-25 (×4): 10 mg via ORAL
  Filled 2014-11-21 (×4): qty 1

## 2014-11-21 MED ORDER — GABAPENTIN 300 MG PO CAPS
300.0000 mg | ORAL_CAPSULE | Freq: Three times a day (TID) | ORAL | Status: DC
  Administered 2014-11-21 – 2014-11-25 (×11): 300 mg via ORAL
  Filled 2014-11-21 (×12): qty 1

## 2014-11-21 MED ORDER — MORPHINE SULFATE (PF) 2 MG/ML IV SOLN
2.0000 mg | INTRAVENOUS | Status: DC | PRN
  Administered 2014-11-23: 2 mg via INTRAVENOUS
  Filled 2014-11-21: qty 1

## 2014-11-21 NOTE — ED Notes (Signed)
Pt was seen here yesterday for nausea and epigastric pain this am started having vomiting and worsening epigastric this am that is burning.

## 2014-11-21 NOTE — ED Provider Notes (Signed)
CSN: [id number]     Arrival date & time 11/21/14  05/16/16 History   First MD Initiated Contact with Patient 11/21/14 1620     Chief Complaint  Patient presents with  . Emesis     (Consider location/radiation/quality/duration/timing/severity/associated sxs/prior Treatment) HPI Jodi Chavez is a 64 y.o. female with a history of hypertension, hyperlipidemia, diabetes, comes in for evaluation of nausea and vomiting. Patient states for the past 3 days she has had persistent nausea and vomiting with diffuse abdominal cramping. She reports 2 loose stools this morning that were not overtly dark or bloody. Denies fevers, chills, urinary symptoms, neck pain, vaginal bleeding or discharge. Nothing seems to make her problem better or worse. Overall discomfort now in ED is moderate. No other aggravating or modifying factors.  Past Medical History  Diagnosis Date  . PONV (postoperative nausea and vomiting)   . Hypertension     takes Benazepril nightly and Propranolol tid and Clonidine daily  . Hyperlipidemia     takes Zetia and Zocor daily  . History of bronchitis     last time several yrs ago  . History of migraine     many yrs ago  . Dizziness     occasionally and related to meds   . Peripheral edema   . Peripheral neuropathy   . Arthritis     hip  . Low back pain   . Constipation     uses laxatives several times a week  . Slow urinary stream     occasionally  . Diabetes mellitus     takes Metformin and Amaryl daily  . Early cataracts, bilateral   . Depression   . Insomnia    Past Surgical History  Procedure Laterality Date  . 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 D&c/hysteroscopy/ablation    . Dilation and curettage of uterus      couple of times  . Colonoscopy    . Esophagogastroduodenoscopy    . Posterior cervical fusion/foraminotomy  10/15/2011    Procedure: POSTERIOR CERVICAL FUSION/FORAMINOTOMY LEVEL 2;  Surgeon: Jessy Oto, MD;  Location: [place];   Service: Orthopedics;  Laterality: N/A;  Right C6-7, C7-T1 Foraminotomy with excision HNP C7-T1  . Upper gastrointestinal endoscopy     Family History  Problem Relation Age of Onset  . Cancer Mother   . Stroke Mother   . Diabetes Mother   . Hypertension Mother   . Colon cancer Mother 75    died 2005/05/16  . Gout Father   . Heart disease Father    Social History  Substance Use Topics  . Smoking status: Never Smoker   . Smokeless tobacco: Never Used  . Alcohol Use: No   OB History    No data available     Review of Systems A 10 point review of systems was completed and was negative except for pertinent positives and negatives as mentioned in the history of present illness     Allergies  Naproxen sodium  Home Medications   Prior to Admission medications   Medication Sig Start Date End Date Taking? Authorizing Norva Bowe  acetaminophen (TYLENOL) 500 MG tablet Take 500 mg by mouth every 6 (six) hours as needed for pain.    Historical Brittan Mapel, MD  benazepril (LOTENSIN) 40 MG tablet Take 40 mg by mouth every evening.     Historical Kollin Udell, MD  calcium-vitamin D (OSCAL WITH D) 500-200 MG-UNIT per tablet Take 1 tablet by mouth daily.  Historical Adolphe Fortunato, MD  cloNIDine (CATAPRES) 0.2 MG tablet Take 0.1 mg by mouth 2 (two) times daily.    Historical Ayaan Ringle, MD  diphenhydrAMINE (BENADRYL) 25 MG tablet Take 25 mg by mouth every 6 (six) hours as needed for itching.    Historical Tametha Banning, MD  doxepin (SINEQUAN) 50 MG capsule Take 100 mg by mouth at bedtime.    Historical Mc Hollen, MD  ezetimibe (ZETIA) 10 MG tablet Take 10 mg by mouth daily.    Historical Andray Assefa, MD  fluticasone Asencion Islam) 50 MCG/ACT nasal spray  06/11/14   Historical Dosia Yodice, MD  gabapentin (NEURONTIN) 300 MG capsule Take 300 mg by mouth 3 (three) times daily.    Historical Valor Quaintance, MD  Garlic 8563 MG CAPS Take 1 capsule by mouth 3 (three) times daily.    Historical Schylar Allard, MD  glimepiride (AMARYL) 2 MG tablet  Take 2 mg by mouth daily before breakfast.    Historical Shivank Pinedo, MD  Glucosamine-Chondroit-Vit C-Mn (GLUCOSAMINE CHONDR 1500 COMPLX PO) Take by mouth.    Historical Arneisha Kincannon, MD  hydrochlorothiazide (HYDRODIURIL) 25 MG tablet Take 25 mg by mouth daily.    Historical Reginia Battie, MD  loratadine (CLARITIN) 10 MG tablet Take 10 mg by mouth daily.    Historical Herman Fiero, MD  meloxicam (MOBIC) 7.5 MG tablet Take 7.5 mg by mouth 2 (two) times daily as needed for pain.    Historical Cathie Bonnell, MD  metFORMIN (GLUCOPHAGE) 850 MG tablet Take 850 mg by mouth daily with breakfast.     Historical Hadyn Azer, MD  methocarbamol (ROBAXIN) 500 MG tablet Take 1 tablet (500 mg total) by mouth 3 (three) times daily. 10/19/11 10/29/11  Jessy Oto, MD  MOVIPREP 100 G SOLR Take 1 kit (100 g total) by mouth once. moviprep as directed. No substitutions 08/11/12   Jerene Bears, MD  Multiple Vitamin (MULITIVITAMIN WITH MINERALS) TABS Take 1 tablet by mouth daily.    Historical Alegra Rost, MD  ondansetron (ZOFRAN) 4 MG tablet Take 1 tablet (4 mg total) by mouth every 6 (six) hours. 11/20/14   Samantha Tripp Dowless, PA-C  OVER THE COUNTER MEDICATION Apply 1 application topically daily. sween cream applied to groin area daily to prevent rash    Historical Cedric Denison, MD  oxyCODONE (OXY IR/ROXICODONE) 5 MG immediate release tablet Take 1 tablet (5 mg total) by mouth every 4 (four) hours as needed for pain. 10/19/11 10/29/11  Jessy Oto, MD  PROAIR HFA 108 (90 BASE) MCG/ACT inhaler  06/11/14   Historical Babygirl Trager, MD  promethazine (PHENERGAN) 25 MG tablet Take 25 mg by mouth every 6 (six) hours as needed for nausea.    Historical Minor Iden, MD  propranolol (INDERAL) 10 MG tablet Take 10 mg by mouth 3 (three) times daily.    Historical Yuma Blucher, MD  Sennosides 25 MG TABS Take 2 tablets by mouth as needed. Pt takes every other day to every 3 days    Historical Theora Vankirk, MD  simvastatin (ZOCOR) 40 MG tablet Take 40 mg by mouth every evening.     Historical Deval Mroczka, MD  traMADol (ULTRAM) 50 MG tablet  08/20/14   Historical Jimmylee Ratterree, MD   BP 145/102 mmHg  Pulse 120  Temp(Src) 98.7 F (37.1 C) (Oral)  Resp 16  Ht 5' 1"  (1.549 m)  Wt 215 lb (97.523 kg)  BMI 40.64 kg/m2  SpO2 95% Physical Exam  Constitutional: She is oriented to person, place, and time. She appears well-developed and well-nourished.  Obese Caucasian female  HENT:  Head: Normocephalic and atraumatic.  Mouth/Throat: Oropharynx is clear and moist.  Eyes: Conjunctivae are normal. Pupils are equal, round, and reactive to light. Right eye exhibits no discharge. Left eye exhibits no discharge. No scleral icterus.  Neck: Neck supple.  Cardiovascular: Normal rate, regular rhythm and normal heart sounds.   Pulmonary/Chest: Effort normal and breath sounds normal. No respiratory distress. She has no wheezes. She has no rales.  Abdominal: Soft. There is no tenderness.  Diffuse abdominal discomfort with palpation without rebound or guarding. No focal tenderness. Abdomen is soft and nondistended. No pulsatile masses or other deformities noted.  Musculoskeletal: She exhibits no tenderness.  Neurological: She is alert and oriented to person, place, and time.  Cranial Nerves II-XII grossly intact  Skin: Skin is warm and dry. No rash noted.  Psychiatric: She has a normal mood and affect.  Nursing note and vitals reviewed.   ED Course  Procedures (including critical care time) Labs Review Labs Reviewed  COMPREHENSIVE METABOLIC PANEL - Abnormal; Notable for the following:    Glucose, Bld 173 (*)    Creatinine, Ser 1.20 (*)    Calcium 8.8 (*)    GFR calc non Af Amer 47 (*)    GFR calc Af Amer 55 (*)    All other components within normal limits  CBC - Abnormal; Notable for the following:    WBC 12.4 (*)    All other components within normal limits  URINALYSIS, ROUTINE W REFLEX MICROSCOPIC (NOT AT [facility]) - Abnormal; Notable for the following:    APPearance CLOUDY (*)     Hgb urine dipstick TRACE (*)    Ketones, ur >80 (*)    Protein, ur 100 (*)    Leukocytes, UA SMALL (*)    All other components within normal limits  URINE MICROSCOPIC-ADD ON - Abnormal; Notable for the following:    Casts HYALINE CASTS (*)    All other components within normal limits  URINE CULTURE  LIPASE, BLOOD    Imaging Review Ct Abdomen Pelvis W Contrast  11/20/2014   CLINICAL DATA:  Abdominal pain for 2-3 days with diarrhea  EXAM: CT ABDOMEN AND PELVIS WITH CONTRAST  TECHNIQUE: Multidetector CT imaging of the abdomen and pelvis was performed using the standard protocol following bolus administration of intravenous contrast.  CONTRAST:  36m OMNIPAQUE IOHEXOL 300 MG/ML  SOLN  COMPARISON:  None.  FINDINGS: Lung bases are free of acute infiltrate or sizable effusion.  The gallbladder is well distended and demonstrates a few dependent gallstones. No complicating factors are noted. The liver, spleen, adrenal glands and pancreas are otherwise within normal limits. The kidneys are well visualized bilaterally within normal enhancement pattern. Normal excretion is noted bilaterally.  The appendix is not well visualized although no inflammatory changes are seen. A small fat containing umbilical hernia is noted. The bladder is partially distended. No pelvic mass lesion or sidewall abnormality is noted. Chronic degenerative changes of both hip joints are seen right greater than left. Degenerative changes of the lumbar spine are noted.  IMPRESSION: Cholelithiasis without complicating factors.  Fat containing umbilical hernia.  No other focal abnormality   Electronically Signed   By: MInez CatalinaM.D.   On: 11/20/2014 19:04   I have personally reviewed and evaluated these images and lab results as part of my medical decision-making.   EKG Interpretation None     Meds given in ED:  Medications  sodium chloride 0.9 % bolus 1,000 mL (0 mLs Intravenous Stopped 11/21/14 1859)  ondansetron (ZOFRAN)  injection 4  mg (4 mg Intravenous Given 11/21/14 1655)  LORazepam (ATIVAN) injection 1 mg (1 mg Intravenous Given 11/21/14 1727)  LORazepam (ATIVAN) injection 1 mg (1 mg Intravenous Not Given 11/21/14 1916)  sodium chloride 0.9 % bolus 1,000 mL (0 mLs Intravenous Stopped 11/21/14 2020)  cefTRIAXone (ROCEPHIN) 1 g in dextrose 5 % 50 mL IVPB (0 g Intravenous Stopped 11/21/14 2032)    New Prescriptions   No medications on file   Filed Vitals:   11/21/14 1915 11/21/14 1938 11/21/14 1945 11/21/14 2015  BP: 188/116 171/111 198/125 145/102  Pulse: 121 125 136 120  Temp:      TempSrc:      Resp:  16    Height:      Weight:      SpO2: 93% 93% 90% 95%    MDM  Avarie P Doren is a 64 y.o. female who presents for evaluation of intractable nausea and vomiting. Patient states for the past 2 days she has been unable to keep any food or fluids down. No fevers, chills, diarrhea. She denies any other medical symptoms. On exam, patient is tachycardic and is intermittently vomiting a clear emesis despite multiple antiemetics. Patient has been given 2 L of normal saline, but remains tachycardic. Benign abdominal exam, no focal tenderness. Abdomen remains soft and nondistended. No peritoneal signs.  CBC shows leukocytosis of 12.4, only slightly elevated from 12.0 yesterday. Labs are otherwise noncontributory. Evidence of mild UTI on urinalysis with small leukocytes, 7-10 white cells. Treated in ED with 1 g Rocephin, culture obtained prior to antibiotics. Patient had CT abdomen pelvis completed yesterday in the ED shows no acute intra-abdominal pathology. There is cholelithiasis without any other findings. I believe the patient, at this time, would benefit from medical admission for further evaluation and management of intractable nausea and vomiting. Discussed with my attending, Dr. Eulis Foster who also saw and evaluated the patient and agrees with the plan for medical admission. Consult to hospitalist, Dr. Blaine Hamper, patient  admitted. Final diagnoses:  Intractable vomiting with nausea, vomiting of unspecified type        Comer Locket, PA-C 11/21/14 2043  Daleen Bo, MD 11/21/14 [phone]

## 2014-11-21 NOTE — Progress Notes (Signed)
ANTIBIOTIC CONSULT NOTE - INITIAL  Pharmacy Consult for vancomycin and zosyn Indication: R/o sepsis, urosepsis, possible intra-abdominal infection  Allergies  Allergen Reactions  . Naproxen Sodium Rash    anaprox    Patient Measurements: Height:  (154.9 cm) Weight: 215 lb (97.523 kg) IBW/kg (Calculated) : 47.8   Vital Signs: Temp: 98.7 F (37.1 C) (09/25 1626) Temp Source: Oral (09/25 1626) BP: 173/102 mmHg (09/25 2030) Pulse Rate: 124 (09/25 2030) Intake/Output from previous day:   Intake/Output from this shift:    Labs:  Recent Labs  11/20/14 1129 11/21/14 1653  WBC 12.0* 12.4*  HGB 15.7* 14.3  PLT 301 281  CREATININE 1.36* 1.20*   Estimated Creatinine Clearance: 51.3 mL/min (by C-G formula based on Cr of 1.2). No results for input(s): VANCOTROUGH, VANCOPEAK, VANCORANDOM, GENTTROUGH, GENTPEAK, GENTRANDOM, TOBRATROUGH, TOBRAPEAK, TOBRARND, AMIKACINPEAK, AMIKACINTROU, AMIKACIN in the last 72 hours.   Microbiology: No results found for this or any previous visit (from the past 720 hour(s)).  Medical History: Past Medical History  Diagnosis Date  . PONV (postoperative nausea and vomiting)   . Hypertension     takes Benazepril nightly and Propranolol tid and Clonidine daily  . Hyperlipidemia     takes Zetia and Zocor daily  . History of bronchitis     last time several yrs ago  . History of migraine     many yrs ago  . Dizziness     occasionally and related to meds   . Peripheral edema   . Peripheral neuropathy   . Arthritis     hip  . Low back pain   . Constipation     uses laxatives several times a week  . Slow urinary stream     occasionally  . Diabetes mellitus     takes Metformin and Amaryl daily  . Early cataracts, bilateral   . Depression   . Insomnia     Assessment: 75 yof with possible sepsis from UTI, URI, or intra-abdominal infection. Tmax 99.1, wbc 12.4, pan cultures ordered. Scr 1.2.  Goal of Therapy:  Vancomycin trough  level 15-20 mcg/ml  Plan:  Measure antibiotic drug levels at steady state Follow up culture results Zosyn 3.375g IV q8 hours  Vancomycin  IV x1 then 750 q12 hours  Sheppard Coil PharmD., BCPS Clinical Pharmacist Pager [phone] 11/21/2014 9:35 PM

## 2014-11-21 NOTE — H&P (Signed)
[facility] History and Physical  Jodi Chavez UXN:[id number] DOB: 04/19/1950 DOA: 11/21/2014  Referring physician: ED physician PCP: Philis Fendt, MD  Specialists:   Chief Complaint: Nausea, vomiting, abdominal pain  HPI: Jodi Chavez is a 64 y.o. female with PMH of hypertension, hyperlipidemia, diabetes mellitus, depression, migraine headache, diabetic neuropathy, chronic back pain, insomnia, who presents with nausea, vomiting, abdominal pain.  Patient reports that she has been having persistent nausea, vomiting, abdominal pain in the past 3 days. She vomited more than 10 times today, without blood in the vomitus. She has mild abdominal pain over epigastric area. She reports 2 loose stools this morning that were not overtly dark or bloody. Denies fevers, chills, vaginal bleeding or discharge. Patient has mild dry cough and mild shortness of breath. No chest pain. Her shortness of breath has resolved. She has increased urinary frequency, but no dysuria or burning on urination. No rashes or unilateral weakness. Patient was seen in Ed yesterday and had CT-abd/pelvis, which should cholelithiasis without complicating factor and fat containing umbilical hernia.  In ED, patient was found to have WBC 12.4, lactate 2.06 on 9/24-->0.82 today, tachycardia, temperature normal, lipase 33, AKI, positive urinalysis with small amount of leukocytes. Patient is admitted to inpatient for further reevaluation and treatment.  Where does patient live?   At home    Can patient participate in ADLs?  Yes     Review of Systems:   General: no fevers, chills, no changes in body weight, has poor appetite, has fatigue HEENT: no blurry vision, hearing changes or sore throat Pulm: has dyspnea, coughing,  No wheezing CV: no chest pain, palpitations Abd: has nausea, vomiting, abdominal pain, loose stool, no constipation GU: no dysuria, burning on urination, has increased urinary frequency, no hematuria  Ext:  no leg edema Neuro: no unilateral weakness, numbness, or tingling, no vision change or hearing loss Skin: no rash MSK: No muscle spasm, no deformity, no limitation of range of movement in spin Heme: No easy bruising.  Travel history: No recent long distant travel.  Allergy:  Allergies  Allergen Reactions  . Naproxen Sodium Rash    anaprox    Past Medical History  Diagnosis Date  . PONV (postoperative nausea and vomiting)   . Hypertension     takes Benazepril nightly and Propranolol tid and Clonidine daily  . Hyperlipidemia     takes Zetia and Zocor daily  . History of bronchitis     last time several yrs ago  . History of migraine     many yrs ago  . Dizziness     occasionally and related to meds   . Peripheral edema   . Peripheral neuropathy   . Arthritis     hip  . Low back pain   . Constipation     uses laxatives several times a week  . Slow urinary stream     occasionally  . Diabetes mellitus     takes Metformin and Amaryl daily  . Early cataracts, bilateral   . Depression   . Insomnia     Past Surgical History  Procedure Laterality Date  . 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 D&c/hysteroscopy/ablation    . Dilation and curettage of uterus      couple of times  . Colonoscopy    . Esophagogastroduodenoscopy    . Posterior cervical fusion/foraminotomy  10/15/2011    Procedure: POSTERIOR CERVICAL FUSION/FORAMINOTOMY LEVEL 2;  Surgeon: Jessy Oto,  MD;  Location: [facility];  Service: Orthopedics;  Laterality: N/A;  Right C6-7, C7-T1 Foraminotomy with excision HNP C7-T1  . Upper gastrointestinal endoscopy      Social History:  reports that she has never smoked. She has never used smokeless tobacco. She reports that she does not drink alcohol or use illicit drugs.  Family History:  Family History  Problem Relation Age of Onset  . Cancer Mother   . Stroke Mother   . Diabetes Mother   . Hypertension Mother   . Colon cancer Mother  88    died 04-18-05  . Gout Father   . Heart disease Father      Prior to Admission medications   Medication Sig Start Date End Date Taking? Authorizing Provider  acetaminophen (TYLENOL) 500 MG tablet Take 500 mg by mouth every 6 (six) hours as needed for pain.    Historical Provider, MD  benazepril (LOTENSIN) 40 MG tablet Take 40 mg by mouth every evening.     Historical Provider, MD  calcium-vitamin D (OSCAL WITH D) 500-200 MG-UNIT per tablet Take 1 tablet by mouth daily.     Historical Provider, MD  cloNIDine (CATAPRES) 0.2 MG tablet Take 0.1 mg by mouth 2 (two) times daily.    Historical Provider, MD  diphenhydrAMINE (BENADRYL) 25 MG tablet Take 25 mg by mouth every 6 (six) hours as needed for itching.    Historical Provider, MD  doxepin (SINEQUAN) 50 MG capsule Take 100 mg by mouth at bedtime.    Historical Provider, MD  ezetimibe (ZETIA) 10 MG tablet Take 10 mg by mouth daily.    Historical Provider, MD  fluticasone Asencion Islam) 50 MCG/ACT nasal spray  06/11/14   Historical Provider, MD  gabapentin (NEURONTIN) 300 MG capsule Take 300 mg by mouth 3 (three) times daily.    Historical Provider, MD  Garlic 8119 MG CAPS Take 1 capsule by mouth 3 (three) times daily.    Historical Provider, MD  glimepiride (AMARYL) 2 MG tablet Take 2 mg by mouth daily before breakfast.    Historical Provider, MD  Glucosamine-Chondroit-Vit C-Mn (GLUCOSAMINE CHONDR 1500 COMPLX PO) Take by mouth.    Historical Provider, MD  hydrochlorothiazide (HYDRODIURIL) 25 MG tablet Take 25 mg by mouth daily.    Historical Provider, MD  loratadine (CLARITIN) 10 MG tablet Take 10 mg by mouth daily.    Historical Provider, MD  meloxicam (MOBIC) 7.5 MG tablet Take 7.5 mg by mouth 2 (two) times daily as needed for pain.    Historical Provider, MD  metFORMIN (GLUCOPHAGE) 850 MG tablet Take 850 mg by mouth daily with breakfast.     Historical Provider, MD  methocarbamol (ROBAXIN) 500 MG tablet Take 1 tablet (500 mg total) by mouth 3  (three) times daily. 10/19/11 10/29/11  Jessy Oto, MD  MOVIPREP 100 G SOLR Take 1 kit (100 g total) by mouth once. moviprep as directed. No substitutions 08/11/12   Jerene Bears, MD  Multiple Vitamin (MULITIVITAMIN WITH MINERALS) TABS Take 1 tablet by mouth daily.    Historical Provider, MD  ondansetron (ZOFRAN) 4 MG tablet Take 1 tablet (4 mg total) by mouth every 6 (six) hours. 11/20/14   Samantha Tripp Dowless, PA-C  OVER THE COUNTER MEDICATION Apply 1 application topically daily. sween cream applied to groin area daily to prevent rash    Historical Provider, MD  oxyCODONE (OXY IR/ROXICODONE) 5 MG immediate release tablet Take 1 tablet (5 mg total) by mouth every 4 (four) hours as  needed for pain. 10/19/11 10/29/11  Jessy Oto, MD  PROAIR HFA 108 (90 BASE) MCG/ACT inhaler  06/11/14   Historical Provider, MD  promethazine (PHENERGAN) 25 MG tablet Take 25 mg by mouth every 6 (six) hours as needed for nausea.    Historical Provider, MD  propranolol (INDERAL) 10 MG tablet Take 10 mg by mouth 3 (three) times daily.    Historical Provider, MD  Sennosides 25 MG TABS Take 2 tablets by mouth as needed. Pt takes every other day to every 3 days    Historical Provider, MD  simvastatin (ZOCOR) 40 MG tablet Take 40 mg by mouth every evening.    Historical Provider, MD  traMADol Veatrice Bourbon) 50 MG tablet  08/20/14   Historical Provider, MD    Physical Exam: Filed Vitals:   11/21/14 1945 11/21/14 2015 11/21/14 2030 11/21/14 2120  BP: 198/125 145/102 173/102 187/89  Pulse: 136 120 124 124  Temp:    98.9 F (37.2 C)  TempSrc:    Oral  Resp:   16 17  Height:    _0  (1.549 m)  Weight:    94.847 kg (209 lb 1.6 oz)  SpO2: 90% 95% 96% 98%   General: Not in acute distress HEENT:       Eyes: PERRL, EOMI, no scleral icterus.       ENT: No discharge from the ears and nose, no pharynx injection, no tonsillar enlargement.        Neck: No JVD, no bruit, no mass felt. Heme: No neck lymph node enlargement. Cardiac:  S1/S2, RRR, tachycardia, No murmurs, No gallops or rubs. Pulm:  No rales, wheezing, rhonchi or rubs. Abd: Soft, nondistended, mild tenderness over epigastric area, no rebound pain, no organomegaly, BS present. Ext: No pitting leg edema bilaterally. 2+DP/PT pulse bilaterally. Musculoskeletal: No joint deformities, No joint redness or warmth, no limitation of ROM in spin. Skin: No rashes.  Neuro: Alert, oriented X3, cranial nerves II-XII grossly intact, muscle strength 5/5 in all extremities, sensation to light touch intact.  Psych: Patient is not psychotic, no suicidal or hemocidal ideation.  Labs on Admission:  Basic Metabolic Panel:  Recent Labs Lab 11/20/14 1129 11/21/14 1653  NA 135 137  K 3.6 3.5  CL 96* 101  CO2 24 22  GLUCOSE 184* 173*  BUN 14 11  CREATININE 1.36* 1.20*  CALCIUM 9.8 8.8*   Liver Function Tests:  Recent Labs Lab 11/20/14 1129 11/21/14 1653  AST 34 28  ALT 29 24  ALKPHOS 83 75  BILITOT 0.7 0.9  PROT 7.9 7.2  ALBUMIN 4.0 3.7    Recent Labs Lab 11/20/14 1129 11/21/14 1653  LIPASE 40 33   No results for input(s): AMMONIA in the last 168 hours. CBC:  Recent Labs Lab 11/20/14 1129 11/21/14 1653  WBC 12.0* 12.4*  NEUTROABS 9.2*  --   HGB 15.7* 14.3  HCT 44.4 41.5  MCV 95.1 95.4  PLT 301 281   Cardiac Enzymes: No results for input(s): CKTOTAL, CKMB, CKMBINDEX, TROPONINI in the last 168 hours.  BNP (last 3 results) No results for input(s): BNP in the last 8760 hours.  ProBNP (last 3 results) No results for input(s): PROBNP in the last 8760 hours.  CBG:  Recent Labs Lab 11/20/14 1153 11/21/14 2126  GLUCAP 191* 164*    Radiological Exams on Admission: Dg Chest 2 View  11/22/2014   CLINICAL DATA:  Cough.  Nausea and vomiting  EXAM: CHEST  2 VIEW  COMPARISON:  10/12/2011  FINDINGS: Chronic hypoventilation. There is no edema, consolidation, effusion, or pneumothorax. Normal heart size and mediastinal contours.  IMPRESSION: 1. No  acute finding. 2. Low volume chest.   Electronically Signed   By: Monte Fantasia M.D.   On: 11/22/2014 00:41   US Abdomen Complete  11/22/2014   CLINICAL DATA:  Intractable nausea and vomiting. Abdominal pain over epigastric area. Symptoms for 2 days.  EXAM: ULTRASOUND ABDOMEN COMPLETE  COMPARISON:  CT 1 day prior.  FINDINGS: Gallbladder: Physiologically distended with sludge and small stones layering dependently and in the gallbladder neck. No gallbladder wall thickening, wall thickness of 2 mm. Sonographic Percell Miller sign is negative.  Common bile duct: Diameter: 2 mm.  Liver: No focal lesion identified. Diffusely increased and heterogeneous in parenchymal echogenicity.  IVC: No abnormality visualized, poorly demonstrated.  Pancreas: Not visualized.  Spleen: Size and appearance within normal limits.  Right Kidney: Length: 10.6 cm. Echogenicity within normal limits. No mass or hydronephrosis visualized.  Left Kidney: Length: 11.1 cm. Echogenicity within normal limits. No mass or hydronephrosis visualized.  Abdominal aorta: No aneurysm visualized. Mid and distal aorta are obscured.  Other findings: None. No ascites. Technically difficult and limited exam due to body habitus and difficulty with breath hold technique.  IMPRESSION: 1. Cholelithiasis without sonographic findings of acute cholecystitis. 2. Hepatic steatosis. 3. Midline structures including the pancreas are obscured.   Electronically Signed   By: Jeb Levering M.D.   On: 11/22/2014 00:37   Ct Abdomen Pelvis W Contrast  11/20/2014   CLINICAL DATA:  Abdominal pain for 2-3 days with diarrhea  EXAM: CT ABDOMEN AND PELVIS WITH CONTRAST  TECHNIQUE: Multidetector CT imaging of the abdomen and pelvis was performed using the standard protocol following bolus administration of intravenous contrast.  CONTRAST:  104m OMNIPAQUE IOHEXOL 300 MG/ML  SOLN  COMPARISON:  None.  FINDINGS: Lung bases are free of acute infiltrate or sizable effusion.  The gallbladder is  well distended and demonstrates a few dependent gallstones. No complicating factors are noted. The liver, spleen, adrenal glands and pancreas are otherwise within normal limits. The kidneys are well visualized bilaterally within normal enhancement pattern. Normal excretion is noted bilaterally.  The appendix is not well visualized although no inflammatory changes are seen. A small fat containing umbilical hernia is noted. The bladder is partially distended. No pelvic mass lesion or sidewall abnormality is noted. Chronic degenerative changes of both hip joints are seen right greater than left. Degenerative changes of the lumbar spine are noted.  IMPRESSION: Cholelithiasis without complicating factors.  Fat containing umbilical hernia.  No other focal abnormality   Electronically Signed   By: MInez CatalinaM.D.   On: 11/20/2014 19:04    EKG: Not done in ED, will get one.   Assessment/Plan Principal Problem:   Nausea & vomiting Active Problems:   Diabetes mellitus without complication   Essential hypertension   Cervical spondylosis with radiculopathy   HLD (hyperlipidemia)   Depression   Sepsis   Coughing   UTI (lower urinary tract infection)   AKI (acute kidney injury)   Nausea with vomiting   Sepsis: Patient is septic on admission with leukocytosis, tachycardia and elevated lactate yesterday. She is hemodynamically stable. Source of Infection is not clear, possibly include UTI given increased urinary frequency and positive urinalysis, bronchitis or pneumonia given cough and shortness of breath, intra-abdominal infection, such as biliary system infection. Patient received 1 dose of Rocephin in Ed, will switch to antibiotics with broad coverage.  -will admit  to tele bed -will get Procalcitonin and trend lactic acid levels per sepsis protocol. -IVF: 3.0 L of NS bolus in ED, followed by 75 cc/h -Start antibiotics with broad coverage: Vancomycin and Zosyn -f/u blood and urine culture  Possible  UTI: Patient has increased urinary frequency and positive urinalysis with small amount of leukocytes. -On vancomycin and Zosyn as above -follow up urine culture  Nausea, vomiting, abdominal pain: CT abdomen/pelvis showed gallstone, but no cholecystitis, which cannot completely rule out biliary system infection. Patient reports having had loose stool, but no diarrhea currently.  -will get US-abd -IVF as above -When necessary Zofran for nausea and morphine for pain -Will get C. difficile PCR if has diarrhea again (not ordered yet)  Cough: Etiology is not clear. Her symptoms is mild, possibly due to bronchitis. -On IV abx as above -CXR -prn albuterol nebs -Mucinex for cough  DM-II: Last A1c 5.7 on 07/19/09, well controled. Patient is taking metformin and Amaryl at home -SSI -Check A1c  HTN: -Hold Lotensin, HCTZ due to worsening renal function -IV hydralazine when necessary -Continue clonidine and propranolol  HLD: Last LDL was 56 on 11/01/09  -Continue home medications: Zetia and Zocor  AKI: Likely due to prerenal secondary to dehydration and continuation of ARB, diruetics and NSAIDs. No hydronephrosis by CT abdomen/pelvis. - IVF as above - Check FeUrea - Follow up renal function by BMP - Hold HCTZ, Lotensin and mobic  Cervical spondylosis with radiculopathy: -prn tylenol -Robaxin  Depression: Stable, no suicidal or homicidal ideations. -Continue home medications: Doxepin  DVT ppx: SQ Heparin   Code Status: Full code Family Communication: None at bed side.              Yes, patient's       at bed side Disposition Plan: Admit to inpatient   Date of Service 11/22/2014    Ivor Costa [facility] Pager [phone]  If 7PM-7AM, please contact night-coverage [email or web address] Password [email or web address] 11/22/2014, 1:11 AM

## 2014-11-21 NOTE — Progress Notes (Signed)
Called ER RN for report. Room ready.  

## 2014-11-21 NOTE — ED Provider Notes (Signed)
  Face-to-face evaluation   History: Patient ill for one day with nausea and vomiting. Evaluated here yesterday, I saw the patient that time. She is a comprehensive evaluation was discharged after improving with treatment. She began vomiting after discharge and continues vomiting "multiple times" today. She also complains of upper abdominal pain. She had a CT yesterday which was negative for acute abnormalities.  Physical exam: Nontoxic appearance. Abdomen is soft with mild left and right upper quadrant tenderness. No rebound tenderness.  Medical screening examination/treatment/procedure(s) were conducted as a shared visit with non-physician practitioner(s) and myself.  I personally evaluated the patient during the encounter  Mancel Bale, MD 11/21/14 2345

## 2014-11-22 LAB — INFLUENZA PANEL BY PCR (TYPE A & B)
H1N1FLUPCR: NOT DETECTED
Influenza A By PCR: NEGATIVE
Influenza B By PCR: NEGATIVE

## 2014-11-22 LAB — BASIC METABOLIC PANEL
Anion gap: 11 (ref 5–15)
Anion gap: 9 (ref 5–15)
BUN: 10 mg/dL (ref 6–20)
BUN: 9 mg/dL (ref 6–20)
CHLORIDE: 105 mmol/L (ref 101–111)
CHLORIDE: 104 mmol/L (ref 101–111)
CO2: 23 mmol/L (ref 22–32)
CO2: 26 mmol/L (ref 22–32)
CREATININE: 0.99 mg/dL (ref 0.44–1.00)
Calcium: 7.8 mg/dL — ABNORMAL LOW (ref 8.9–10.3)
Calcium: 7.9 mg/dL — ABNORMAL LOW (ref 8.9–10.3)
Creatinine, Ser: 0.93 mg/dL (ref 0.44–1.00)
GFR calc Af Amer: >60 mL/min (ref >60)
GFR calc Af Amer: >60 mL/min (ref >60)
GFR calc non Af Amer: 59 mL/min — ABNORMAL LOW (ref >60)
GFR calc non Af Amer: >60 mL/min (ref >60)
GLUCOSE: 124 mg/dL — AB (ref 65–99)
GLUCOSE: 121 mg/dL — AB (ref 65–99)
POTASSIUM: 3.6 mmol/L (ref 3.5–5.1)
POTASSIUM: 3.6 mmol/L (ref 3.5–5.1)
Sodium: 139 mmol/L (ref 135–145)
Sodium: 139 mmol/L (ref 135–145)

## 2014-11-22 LAB — LACTIC ACID, PLASMA: LACTIC ACID, VENOUS: 1.6 mmol/L (ref 0.5–2.0)

## 2014-11-22 LAB — RAPID URINE DRUG SCREEN, HOSP PERFORMED
AMPHETAMINES: NOT DETECTED
BARBITURATES: NOT DETECTED
BENZODIAZEPINES: NOT DETECTED
Cocaine: NOT DETECTED
Opiates: NOT DETECTED
TETRAHYDROCANNABINOL: NOT DETECTED

## 2014-11-22 LAB — GLUCOSE, CAPILLARY
GLUCOSE-CAPILLARY: 150 mg/dL — AB (ref 65–99)
Glucose-Capillary: 148 mg/dL — ABNORMAL HIGH (ref 65–99)
Glucose-Capillary: 143 mg/dL — ABNORMAL HIGH (ref 65–99)
Glucose-Capillary: 110 mg/dL — ABNORMAL HIGH (ref 65–99)
Glucose-Capillary: 105 mg/dL — ABNORMAL HIGH (ref 65–99)

## 2014-11-22 LAB — MAGNESIUM
MAGNESIUM: 1.6 mg/dL — AB (ref 1.7–2.4)
Magnesium: 0.8 mg/dL — CL (ref 1.7–2.4)

## 2014-11-22 LAB — PROCALCITONIN

## 2014-11-22 LAB — HIV ANTIBODY (ROUTINE TESTING W REFLEX): HIV SCREEN 4TH GENERATION: NONREACTIVE

## 2014-11-22 LAB — STREP PNEUMONIAE URINARY ANTIGEN: STREP PNEUMO URINARY ANTIGEN: NEGATIVE

## 2014-11-22 LAB — TSH: TSH: 0.58 u[IU]/mL (ref 0.350–4.500)

## 2014-11-22 LAB — CREATININE, URINE, RANDOM: CREATININE, URINE: 61.76 mg/dL

## 2014-11-22 MED ORDER — SENNOSIDES-DOCUSATE SODIUM 8.6-50 MG PO TABS
2.0000 | ORAL_TABLET | Freq: Two times a day (BID) | ORAL | Status: DC
  Administered 2014-11-22 – 2014-11-24 (×5): 2 via ORAL
  Filled 2014-11-22 (×5): qty 2

## 2014-11-22 MED ORDER — SODIUM CHLORIDE 0.9 % IV BOLUS (SEPSIS): 1000.0000 mL | INTRAVENOUS | Status: DC | PRN

## 2014-11-22 MED ORDER — MENTHOL 3 MG MT LOZG
1.0000 | LOZENGE | OROMUCOSAL | Status: DC | PRN
  Filled 2014-11-22: qty 9

## 2014-11-22 MED ORDER — MAGNESIUM SULFATE 2 GM/50ML IV SOLN
2.0000 g | Freq: Once | INTRAVENOUS | Status: AC
  Administered 2014-11-22: 2 g via INTRAVENOUS
  Filled 2014-11-22: qty 50

## 2014-11-22 MED ORDER — INSULIN ASPART 100 UNIT/ML ~~LOC~~ SOLN
0.0000 [IU] | Freq: Three times a day (TID) | SUBCUTANEOUS | Status: DC
  Administered 2014-11-22: 1 [IU] via SUBCUTANEOUS
  Administered 2014-11-24 – 2014-11-25 (×3): 2 [IU] via SUBCUTANEOUS
  Administered 2014-11-25: 3 [IU] via SUBCUTANEOUS
  Administered 2014-11-25: 1 [IU] via SUBCUTANEOUS

## 2014-11-22 NOTE — Progress Notes (Signed)
OT Cancellation Note  Patient Details Name: Jodi Chavez MRN: [id number] DOB: 1951-01-14   Cancelled Treatment:    Reason Eval/Treat Not Completed: Other (comment). In to see pt for evaluation, pt rather sleepy and lethargic with difficult to arouse just by calling her name. Got her partially awake by stating her name and rubbing her shoulder, but then she could not stay awake--stating she had had a rough couple of days. She could tell me that PT saw her earlier, but then I had to explain to her twice who I was since she fell asleep while I was telling her the first time and then she asked me again who I was. Will re-attempt eval later today or tomorrow as schedule allows.  Evette Georges [phone] 11/22/2014, 1:18 PM

## 2014-11-22 NOTE — Evaluation (Signed)
Physical Therapy Evaluation Patient Details Name: Jodi Chavez MRN: [id number] DOB: [birth year] Today's Date: 11/22/2014   History of Present Illness  HPI: Jodi Chavez is a 64 y.o. female with PMH of hypertension, hyperlipidemia, diabetes mellitus, depression, migraine headache, diabetic neuropathy, chronic back pain, insomnia, who presents with nausea, vomiting, abdominal pain. Sepsis, possible UTI, cough, AKI  Clinical Impression   Pt admitted with above diagnosis. Pt currently with functional limitations due to the deficits listed below (see PT Problem List).  Pt will benefit from skilled PT to increase their independence and safety with mobility to allow discharge to the venue listed below.       Follow Up Recommendations Outpatient PT    Equipment Recommendations  3in1 (PT)    Recommendations for Other Services       Precautions / Restrictions Precautions Precautions: Fall Precaution Comments: Fall greatly reduced with use of RW      Mobility  Bed Mobility Overal bed mobility: Needs Assistance Bed Mobility: Supine to Sit;Sit to Supine     Supine to sit: Min guard Sit to supine: Mod assist   General bed mobility comments: Cues for technqiue, use of bedrails and intitation; mod assist to help LEs into bed after walking  Transfers Overall transfer level: Needs assistance Equipment used: Rolling walker (2 wheeled) Transfers: Sit to/from Stand Sit to Stand: Min assist         General transfer comment: Cues for safety and hand placement; assist for descent to lower commode  Ambulation/Gait Ambulation/Gait assistance: Min guard Ambulation Distance (Feet): 20 Feet (to/from bathroom) Assistive device: Rolling walker (2 wheeled) Gait Pattern/deviations: Trunk flexed;Decreased stride length;Decreased step length - right;Decreased step length - left Gait velocity: slowed   General Gait Details: Tending to keep rW too far infront of her; cues for better  use  Stairs            Wheelchair Mobility    Modified Rankin (Stroke Patients Only)       Balance Overall balance assessment: Needs assistance           Standing balance-Leahy Scale: Poor                   Standardized Balance Assessment Standardized Balance Assessment :  (Noted Berg score of 34 at Outpatient on 9/22)           Pertinent Vitals/Pain Pain Assessment: No/denies pain (just increased nausea with moving)    Home Living Family/patient expects to be discharged to:: Private residence Living Arrangements: Alone Available Help at Discharge: Family;Available PRN/intermittently (sister lives in [place]) Type of Home: House Home Access: Level entry;Stairs to enter     Home Layout: One level Home Equipment: Environmental consultant - 2 wheels;Walker - 4 wheels      Prior Function Level of Independence: Independent with assistive device(s)         Comments: RW     Hand Dominance        Extremity/Trunk Assessment   Upper Extremity Assessment: Defer to OT evaluation           Lower Extremity Assessment: Generalized weakness         Communication   Communication: No difficulties  Cognition Arousal/Alertness: Awake/alert Behavior During Therapy: WFL for tasks assessed/performed Overall Cognitive Status: Within Functional Limits for tasks assessed                      General Comments      Exercises  Assessment/Plan    PT Assessment Patient needs continued PT services  PT Diagnosis Difficulty walking;Generalized weakness   PT Problem List Decreased strength;Decreased activity tolerance;Decreased balance;Decreased mobility;Decreased coordination;Decreased knowledge of use of DME;Pain;Decreased knowledge of precautions  PT Treatment Interventions DME instruction;Gait training;Functional mobility training;Therapeutic activities;Therapeutic exercise;Balance training;Neuromuscular re-education;Patient/family education    PT Goals (Current goals can be found in the Care Plan section) Acute Rehab PT Goals Patient Stated Goal: Hopes to be back to independence, and back to her Outpatient PT appts PT Goal Formulation: With patient Time For Goal Achievement: 12/06/14 Potential to Achieve Goals: Good    Frequency Min 3X/week   Barriers to discharge Decreased caregiver support Lives alone, must be modified independent to dc home    Co-evaluation               End of Session   Activity Tolerance: Patient tolerated treatment well Patient left: in bed;with call bell/phone within reach           Time: 8295-6213 (minus approx 3 minutes while toileting) PT Time Calculation (min) (ACUTE ONLY): 30 min   Charges:   PT Evaluation $Initial PT Evaluation Tier I: 1 Procedure PT Treatments $Gait Training: 8-22 mins   PT G Codes:        Olen Pel 11/22/2014, 9:39 AM  Van Clines, PT  Acute Rehabilitation Services Pager [phone] Office [phone]

## 2014-11-22 NOTE — Progress Notes (Signed)
PROGRESS NOTE  Jodi Chavez BJY:[id number] DOB: Sep 30, 1950 DOA: 11/21/2014 PCP: Dorrene German, MD  HPI/Recap of past 24 hours:  Reported feeling better, noticed dry cough during encounter, reported year round allergy,reported sore throat (from frequent vomiting?), no n/v today, persistent generalized ab pain, no bm since admission. Wanting to try clear liquid today, Remain sinus tachycardia, additional NS bolus liter ordered,  Assessment/Plan: Principal Problem:   Nausea & vomiting Active Problems:   Diabetes mellitus without complication   Essential hypertension   Cervical spondylosis with radiculopathy   HLD (hyperlipidemia)   Depression   Sepsis   Coughing   UTI (lower urinary tract infection)   AKI (acute kidney injury)   Nausea with vomiting  N/V/abdominal pain: n/v seems resolved, ab pain improved, unclear etiology, ct ab/ab US unremarkable. Gastric emptying study ordered.  Elevation of cr/leukocytosis/Acute renal injury: likely from dehydration, procalcitonin less than 0.1. Will d/c abx. Continue ivf. ua cloudy, rare bacteria/small leukocyte. Received abx, will d/c abx for now, awaiting culture result.  Sinus tachycardia: titrate betablocker, continue hydration, tsh 0.5,   Dry cough, with postnasal drip, sore throat, continue flonase/claritin/cepacol prn/mucinex. cxr unremarkable.  NIDDM2, a1c pending, on ssi, hold oral meds.  Lactic acidosis: from metformin? Held, resolved with ivf.  HTN: continue betablocker/clonidine, hold lotensin/hctz.  Left knee pain, prn pain meds, has been getting PT as outpatient. No erythema, no edema on exam.  Code Status: full  Family Communication: patient   Disposition Plan: likely home when medically stable   Consultants:  none  Procedures:  Gastric emptying study  Antibiotics:  Vanc/zosyn x 1 day   Rocephin x1 dose    Objective: BP 144/70 mmHg  Pulse 122  Temp(Src) 98.2 F (36.8 C) (Oral)  Resp 17  Ht   (1.549 m)  Wt 209 lb 1.6 oz (94.847 kg)  BMI 39.53 kg/m2  SpO2 98%  Intake/Output Summary (Last 24 hours) at 11/22/14 0818 Last data filed at 11/22/14 0600  Gross per 24 hour  Intake 1716.25 ml  Output    800 ml  Net 916.25 ml   Filed Weights   11/21/14 1626 11/21/14 2120  Weight: 215 lb (97.523 kg) 209 lb 1.6 oz (94.847 kg)    Exam:   General:  NAD, obesity  Cardiovascular: sinus tachycardia  Respiratory: CTABL  Abdomen: mild diffuse tender, no guarding, no rebound, Soft/ND, positive BS  Musculoskeletal: No Edema  Neuro: aaox3, anxious  Data Reviewed: Basic Metabolic Panel:  Recent Labs Lab 11/20/14 1129 11/21/14 1653  NA 135 137  K 3.6 3.5  CL 96* 101  CO2 24 22  GLUCOSE 184* 173*  BUN 14 11  CREATININE 1.36* 1.20*  CALCIUM 9.8 8.8*   Liver Function Tests:  Recent Labs Lab 11/20/14 1129 11/21/14 1653  AST 34 28  ALT 29 24  ALKPHOS 83 75  BILITOT 0.7 0.9  PROT 7.9 7.2  ALBUMIN 4.0 3.7    Recent Labs Lab 11/20/14 1129 11/21/14 1653  LIPASE 40 33   No results for input(s): AMMONIA in the last 168 hours. CBC:  Recent Labs Lab 11/20/14 1129 11/21/14 1653  WBC 12.0* 12.4*  NEUTROABS 9.2*  --   HGB 15.7* 14.3  HCT 44.4 41.5  MCV 95.1 95.4  PLT 301 281   Cardiac Enzymes:   No results for input(s): CKTOTAL, CKMB, CKMBINDEX, TROPONINI in the last 168 hours. BNP (last 3 results) No results for input(s): BNP in the last 8760 hours.  ProBNP (last 3 results) No  results for input(s): PROBNP in the last 8760 hours.  CBG:  Recent Labs Lab 11/20/14 1153 11/21/14 2126 11/22/14 0431 11/22/14 0737  GLUCAP 191* 164* 148* 143*    No results found for this or any previous visit (from the past 240 hour(s)).   Studies: Dg Chest 2 View  11/22/2014   CLINICAL DATA:  Cough.  Nausea and vomiting  EXAM: CHEST  2 VIEW  COMPARISON:  10/12/2011  FINDINGS: Chronic hypoventilation. There is no edema, consolidation, effusion, or  pneumothorax. Normal heart size and mediastinal contours.  IMPRESSION: 1. No acute finding. 2. Low volume chest.   Electronically Signed   By: Marnee Spring M.D.   On: 11/22/2014 00:41   US Abdomen Complete  11/22/2014   CLINICAL DATA:  Intractable nausea and vomiting. Abdominal pain over epigastric area. Symptoms for 2 days.  EXAM: ULTRASOUND ABDOMEN COMPLETE  COMPARISON:  CT 1 day prior.  FINDINGS: Gallbladder: Physiologically distended with sludge and small stones layering dependently and in the gallbladder neck. No gallbladder wall thickening, wall thickness of 2 mm. Sonographic Eulah Pont sign is negative.  Common bile duct: Diameter: 2 mm.  Liver: No focal lesion identified. Diffusely increased and heterogeneous in parenchymal echogenicity.  IVC: No abnormality visualized, poorly demonstrated.  Pancreas: Not visualized.  Spleen: Size and appearance within normal limits.  Right Kidney: Length: 10.6 cm. Echogenicity within normal limits. No mass or hydronephrosis visualized.  Left Kidney: Length: 11.1 cm. Echogenicity within normal limits. No mass or hydronephrosis visualized.  Abdominal aorta: No aneurysm visualized. Mid and distal aorta are obscured.  Other findings: None. No ascites. Technically difficult and limited exam due to body habitus and difficulty with breath hold technique.  IMPRESSION: 1. Cholelithiasis without sonographic findings of acute cholecystitis. 2. Hepatic steatosis. 3. Midline structures including the pancreas are obscured.   Electronically Signed   By: Rubye Oaks M.D.   On: 11/22/2014 00:37    Scheduled Meds: . calcium-vitamin D  1 tablet Oral Daily  . cloNIDine  0.1 mg Oral BID  . dextromethorphan-guaiFENesin  1 tablet Oral BID  . doxepin  100 mg Oral QHS  . ezetimibe  10 mg Oral Daily  . fluticasone  1 spray Each Nare Daily  . gabapentin  300 mg Oral TID  . heparin  5,000 Units Subcutaneous 3 times per day  . insulin aspart  0-9 Units Subcutaneous TID WC  .  loratadine  10 mg Oral Daily  . methocarbamol  500 mg Oral TID  . multivitamin with minerals  1 tablet Oral Daily  . pantoprazole (PROTONIX) IV  40 mg Intravenous Q12H  . piperacillin-tazobactam (ZOSYN)  IV  3.375 g Intravenous 3 times per day  . propranolol  10 mg Oral TID  . simvastatin  40 mg Oral QPM  . vancomycin  750 mg Intravenous Q12H    Continuous Infusions: . sodium chloride 75 mL/hr at 11/21/14 2147     Time spent:  Xu,Fang MD, PhD  [facility] Pager [phone]. If 7PM-7AM, please contact night-coverage at [email or web address], password [facility] 11/22/2014, 8:18 AM  LOS: 1 day

## 2014-11-23 ENCOUNTER — Ambulatory Visit: Payer: Medicare Other | Admitting: Physical Therapy

## 2014-11-23 ENCOUNTER — Inpatient Hospital Stay (HOSPITAL_COMMUNITY): Payer: Medicare Other

## 2014-11-23 LAB — CBC
HCT: 38.3 % (ref 36.0–46.0)
HEMATOCRIT: 38.3 % (ref 36.0–46.0)
HEMOGLOBIN: 12.8 g/dL (ref 12.0–15.0)
Hemoglobin: 12.8 g/dL (ref 12.0–15.0)
MCH: 32.9 pg (ref 26.0–34.0)
MCH: 32.9 pg (ref 26.0–34.0)
MCHC: 33.4 g/dL (ref 30.0–36.0)
MCHC: 33.4 g/dL (ref 30.0–36.0)
MCV: 98.5 fL (ref 78.0–100.0)
MCV: 98.5 fL (ref 78.0–100.0)
PLATELETS: 228 10*3/uL (ref 150–400)
Platelets: 225 10*3/uL (ref 150–400)
RBC: 3.89 MIL/uL (ref 3.87–5.11)
RBC: 3.89 MIL/uL (ref 3.87–5.11)
RDW: 13.9 % (ref 11.5–15.5)
RDW: 13.8 % (ref 11.5–15.5)
WBC: 9.5 10*3/uL (ref 4.0–10.5)
WBC: 9.5 10*3/uL (ref 4.0–10.5)

## 2014-11-23 LAB — GLUCOSE, CAPILLARY
GLUCOSE-CAPILLARY: 112 mg/dL — AB (ref 65–99)
GLUCOSE-CAPILLARY: 120 mg/dL — AB (ref 65–99)
Glucose-Capillary: 96 mg/dL (ref 65–99)
Glucose-Capillary: 148 mg/dL — ABNORMAL HIGH (ref 65–99)

## 2014-11-23 LAB — BASIC METABOLIC PANEL
ANION GAP: 9 (ref 5–15)
BUN: 7 mg/dL (ref 6–20)
CALCIUM: 7.9 mg/dL — AB (ref 8.9–10.3)
CHLORIDE: 103 mmol/L (ref 101–111)
CO2: 24 mmol/L (ref 22–32)
CREATININE: 0.96 mg/dL (ref 0.44–1.00)
GFR calc non Af Amer: >60 mL/min (ref >60)
Glucose, Bld: 99 mg/dL (ref 65–99)
Potassium: 4.4 mmol/L (ref 3.5–5.1)
SODIUM: 136 mmol/L (ref 135–145)

## 2014-11-23 LAB — URINE CULTURE

## 2014-11-23 LAB — MAGNESIUM: MAGNESIUM: 1.8 mg/dL (ref 1.7–2.4)

## 2014-11-23 LAB — LEGIONELLA PNEUMOPHILA SEROGP 1 UR AG: L. PNEUMOPHILA SEROGP 1 UR AG: NEGATIVE

## 2014-11-23 LAB — UREA NITROGEN, URINE: UREA NITROGEN UR: 301 mg/dL

## 2014-11-23 LAB — HEMOGLOBIN A1C
HEMOGLOBIN A1C: 7 % — AB (ref 4.8–5.6)
MEAN PLASMA GLUCOSE: 154 mg/dL

## 2014-11-23 MED ORDER — POLYETHYLENE GLYCOL 3350 17 G PO PACK
17.0000 g | PACK | Freq: Every day | ORAL | Status: DC
  Administered 2014-11-23 – 2014-11-24 (×2): 17 g via ORAL
  Filled 2014-11-23 (×2): qty 1

## 2014-11-23 MED ORDER — HYDROCHLOROTHIAZIDE 25 MG PO TABS
25.0000 mg | ORAL_TABLET | Freq: Every day | ORAL | Status: DC
  Administered 2014-11-23 – 2014-11-25 (×3): 25 mg via ORAL
  Filled 2014-11-23 (×3): qty 1

## 2014-11-23 MED ORDER — TECHNETIUM TC 99M SULFUR COLLOID
2.0000 | Freq: Once | INTRAVENOUS | Status: DC | PRN
  Administered 2014-11-23: 2 via ORAL
  Filled 2014-11-23: qty 2

## 2014-11-23 MED ORDER — ENOXAPARIN SODIUM 40 MG/0.4ML ~~LOC~~ SOLN
40.0000 mg | SUBCUTANEOUS | Status: DC
  Administered 2014-11-24 – 2014-11-25 (×2): 40 mg via SUBCUTANEOUS
  Filled 2014-11-23 (×2): qty 0.4

## 2014-11-23 MED ORDER — BENAZEPRIL HCL 40 MG PO TABS
40.0000 mg | ORAL_TABLET | Freq: Every evening | ORAL | Status: DC
  Administered 2014-11-23 – 2014-11-25 (×3): 40 mg via ORAL
  Filled 2014-11-23 (×3): qty 1

## 2014-11-23 NOTE — Evaluation (Signed)
Occupational Therapy Evaluation Patient Details Name: Jodi Chavez MRN: [id number] DOB: 1950/08/26 Today's Date: 11/23/2014    History of Present Illness 64 y.o. female with PMH of hypertension, hyperlipidemia, diabetes mellitus, depression, migraine headache, diabetic neuropathy, chronic back pain, insomnia, who presents with nausea, vomiting, abdominal pain. Sepsis, possible UTI, cough, AKI   Clinical Impression   Pt admitted to hospital due to reasons stated above. Pt currently with functional limitiations due to the deficits listed below (see OT problem list). Prior to admission patient was functioning at a modified independent level. Pt currently requires min guard to max assist for ADLs. Pt reports Lt knee pain and fear of falling due to instability. Pt will benefit from skilled OT to increase independence and safety with ADLs and balance to allow safe discharge home.    Follow Up Recommendations  No OT follow up    Equipment Recommendations  Tub/shower seat    Recommendations for Other Services       Precautions / Restrictions Precautions Precautions: Fall Restrictions Weight Bearing Restrictions: No      Mobility Bed Mobility Overal bed mobility: Needs Assistance Bed Mobility: Rolling;Sidelying to Sit;Sit to Supine Rolling: Supervision Sidelying to sit: Min assist   Sit to supine: Min assist   General bed mobility comments: Pt required verbal cues for log roll technique, relied on bed rail for sidelying to sit and min assist to help LE's on/off of bed.  Transfers Overall transfer level: Needs assistance Equipment used: Rolling walker (2 wheeled) Transfers: Sit to/from Stand Sit to Stand: Min guard              Balance Overall balance assessment: Needs assistance Sitting-balance support: No upper extremity supported;Feet supported Sitting balance-Leahy Scale: Fair     Standing balance support: Bilateral upper extremity supported;During functional  activity Standing balance-Leahy Scale: Poor Standing balance comment: Pt required heavily on rolling walker for UE support and was unable to turn rolling walker lose to peform clothing manipulation for toileting. Pt relied on UE support from sink counter to perform gromming activities.                             ADL Overall ADL's : Needs assistance/impaired Eating/Feeding: Independent   Grooming: Wash/dry hands;Wash/dry face;Oral care;Min guard;Standing   Upper Body Bathing: Minimal assitance;Sitting   Lower Body Bathing: Maximal assistance;Sit to/from stand   Upper Body Dressing : Minimal assistance;Sitting   Lower Body Dressing: Maximal assistance;Sit to/from stand   Toilet Transfer: Min guard;Ambulation;RW;Comfort height toilet;Grab bars   Toileting- Clothing Manipulation and Hygiene: Minimal assistance;Sitting/lateral lean;Sit to/from stand       Functional mobility during ADLs: Min guard;Rolling walker General ADL Comments: Pt unable to don socks due to limited ability to reach bilateral LE. Pt unable to perform simulated tub transfer due to weakness with lifting Lt LE and Lt knee instability.     Vision     Perception     Praxis      Pertinent Vitals/Pain Pain Assessment: 0-10 Pain Score: 6  Pain Location: Lt knee and stomach Pain Descriptors / Indicators: Throbbing;Discomfort Pain Intervention(s): Monitored during session;Repositioned     Hand Dominance Right   Extremity/Trunk Assessment Upper Extremity Assessment Upper Extremity Assessment: Overall WFL for tasks assessed   Lower Extremity Assessment Lower Extremity Assessment: Defer to PT evaluation   Cervical / Trunk Assessment Cervical / Trunk Assessment: Normal   Communication Communication Communication: No difficulties   Cognition Arousal/Alertness: Awake/alert  Behavior During Therapy: WFL for tasks assessed/performed Overall Cognitive Status: Within Functional Limits for tasks  assessed                     General Comments       Exercises       Shoulder Instructions      Home Living Family/patient expects to be discharged to:: Private residence Living Arrangements: Alone Available Help at Discharge: Family;Available PRN/intermittently Type of Home: House Home Access: Level entry;Stairs to enter Entrance Stairs-Number of Steps: 1 Entrance Stairs-Rails: None Home Layout: One level     Bathroom Shower/Tub: Tub/shower unit Shower/tub characteristics: Engineer, building services: Standard Bathroom Accessibility: Yes How Accessible: Accessible via walker Home Equipment: Walker - 2 wheels;Walker - 4 wheels;Tub bench;Grab bars - tub/shower;Hand held shower head;Adaptive equipment Adaptive Equipment: Reacher;Sock aid Additional Comments: Pt drives      Prior Functioning/Environment Level of Independence: Independent with assistive device(s)        Comments: Adaptive equipment and rolling walker    OT Diagnosis: Generalized weakness;Acute pain   OT Problem List: Decreased strength;Decreased activity tolerance;Impaired balance (sitting and/or standing);Pain   OT Treatment/Interventions: Self-care/ADL training;Therapeutic exercise;Energy conservation;DME and/or AE instruction;Therapeutic activities;Patient/family education    OT Goals(Current goals can be found in the care plan section) Acute Rehab OT Goals Patient Stated Goal: to be able to move better OT Goal Formulation: With patient Time For Goal Achievement: 12/07/14 Potential to Achieve Goals: Good ADL Goals Pt Will Perform Lower Body Bathing: with mod assist;with adaptive equipment;sit to/from stand Pt Will Perform Lower Body Dressing: with mod assist;with adaptive equipment;sit to/from stand Pt Will Transfer to Toilet: with supervision;ambulating;regular height toilet;grab bars Pt Will Perform Toileting - Clothing Manipulation and hygiene: with min guard assist;sitting/lateral  leans;sit to/from stand Pt Will Perform Tub/Shower Transfer: Tub transfer;with mod assist;ambulating;shower seat;grab bars;rolling walker Additional ADL Goal #1: Pt will be min guard assist for in and OOB for ADLs  OT Frequency: Min 2X/week   Barriers to D/C:            Co-evaluation              End of Session Equipment Utilized During Treatment: Gait belt;Rolling walker Nurse Communication: Other (comment) (Pt c/o Lt knee pain)  Activity Tolerance: Patient tolerated treatment well Patient left: in bed;with call bell/phone within reach   Time: 1610-9604 OT Time Calculation (min): 38 min Charges:  OT General Charges $OT Visit: 1 Procedure OT Evaluation $Initial OT Evaluation Tier I: 1 Procedure OT Treatments $Self Care/Home Management : 23-37 mins G-Codes:    Smiley Houseman 12-08-14, 11:08 AM

## 2014-11-23 NOTE — Progress Notes (Signed)
PROGRESS NOTE  Jodi Chavez WUJ:[id number] DOB: 06-16-1950 DOA: 11/21/2014 PCP: Dorrene German, MD  HPI/Recap of past 24 hours:  Reported feeling better, noticed dry cough during encounter, reported year round allergy,rno n/v today, persistent generalized ab pain, no bm since admission. tolerating clear liquid today, sinus tachycardia improving  Assessment/Plan: Principal Problem:   Nausea & vomiting Active Problems:   Diabetes mellitus without complication   Essential hypertension   Cervical spondylosis with radiculopathy   HLD (hyperlipidemia)   Depression   Sepsis   Coughing   UTI (lower urinary tract infection)   AKI (acute kidney injury)   Nausea with vomiting  N/V/abdominal pain:  n/v seems resolved, ab pain improved, unclear etiology, ct ab/ab US unremarkable. Gastric emptying study wnl. Tolerating diet.  Elevation of cr/leukocytosis/Acute renal injury:  likely from dehydration, procalcitonin less than 0.1.  Culture no growth, d/c abx.   Sinus tachycardia:  titrate betablocker, tsh 0.5,  Better   Dry cough, with postnasal drip, sore throat, continue flonase/claritin/cepacol prn/mucinex. cxr unremarkable.  NIDDM2, a1c 7, on ssi, hold oral meds.  Lactic acidosis: from metformin? Held, resolved with ivf.  HTN:  continue betablocker/clonidine,   lotensin/hctz initially held, restarted from 9/27  Left knee pain, prn pain meds, has been getting PT as outpatient. No erythema, no edema on exam.  Code Status: full  Family Communication: patient   Disposition Plan: likely home 1-2 days when medically stable   Consultants:  none  Procedures:  Gastric emptying study  Antibiotics:  Vanc/zosyn x 1 day   Rocephin x1 dose    Objective: BP 146/64 mmHg  Pulse 79  Temp(Src) 97.8 F (36.6 C) (Oral)  Resp 18  Ht  (1.549 m)  Wt 210 lb 9.6 oz (95.528 kg)  BMI 39.81 kg/m2  SpO2 93%  Intake/Output Summary (Last 24 hours) at 11/23/14 1749 Last  data filed at 11/23/14 0600  Gross per 24 hour  Intake   1920 ml  Output   1000 ml  Net    920 ml   Filed Weights   11/21/14 1626 11/21/14 2120 11/22/14 2140  Weight: 215 lb (97.523 kg) 209 lb 1.6 oz (94.847 kg) 210 lb 9.6 oz (95.528 kg)    Exam:   General:  NAD, obesity  Cardiovascular: RRR  Respiratory: CTABL  Abdomen: mild diffuse tender, no guarding, no rebound, Soft/ND, positive BS  Musculoskeletal: No Edema  Neuro: aaox3, anxious  Data Reviewed: Basic Metabolic Panel:  Recent Labs Lab 11/20/14 1129 11/21/14 1653 11/22/14 0925 11/22/14 1411 11/23/14 0438  NA 135 137 139 139 136  K 3.6 3.5 3.6 3.6 4.4  CL 96* 101 105 104 103  CO2 GLUCOSE 184* 173* 124* 121* 99  BUN CREATININE 1.36* 1.20* 0.99 0.93 0.96  CALCIUM 9.8 8.8* 7.8* 7.9* 7.9*  MG  --   --  0.8* 1.6* 1.8   Liver Function Tests:  Recent Labs Lab 11/20/14 1129 11/21/14 1653  AST 34 28  ALT 29 24  ALKPHOS 83 75  BILITOT 0.7 0.9  PROT 7.9 7.2  ALBUMIN 4.0 3.7    Recent Labs Lab 11/20/14 1129 11/21/14 1653  LIPASE 40 33   No results for input(s): AMMONIA in the last 168 hours. CBC:  Recent Labs Lab 11/20/14 1129 11/21/14 1653 11/23/14 0438 11/23/14 0750  WBC 12.0* 12.4* 9.5 9.5  NEUTROABS 9.2*  --   --   --   HGB 15.7*  14.3 12.8 12.8  HCT 44.4 41.5 38.3 38.3  MCV 95.1 95.4 98.5 98.5  PLT 301 281 225 228   Cardiac Enzymes:   No results for input(s): CKTOTAL, CKMB, CKMBINDEX, TROPONINI in the last 168 hours. BNP (last 3 results) No results for input(s): BNP in the last 8760 hours.  ProBNP (last 3 results) No results for input(s): PROBNP in the last 8760 hours.  CBG:  Recent Labs Lab 11/22/14 1119 11/22/14 1604 11/22/14 2124 11/23/14 0829 11/23/14 1136  GLUCAP 110* 105* 150* 96 112*    Recent Results (from the past 240 hour(s))  Urine culture     Status: None   Collection Time: 11/21/14  6:35 PM  Result Value Ref Range Status    Specimen Description URINE, RANDOM  Final   Special Requests NONE  Final   Culture MULTIPLE SPECIES PRESENT, SUGGEST RECOLLECTION  Final   Report Status 11/23/2014 FINAL  Final  Culture, blood (routine x 2) Call MD if unable to obtain prior to antibiotics being given     Status: None (Preliminary result)   Collection Time: 11/21/14 10:20 PM  Result Value Ref Range Status   Specimen Description BLOOD RIGHT ARM  Final   Special Requests BOTTLES DRAWN AEROBIC AND ANAEROBIC 6CC   Final   Culture NO GROWTH 2 DAYS  Final   Report Status PENDING  Incomplete  Culture, blood (routine x 2) Call MD if unable to obtain prior to antibiotics being given     Status: None (Preliminary result)   Collection Time: 11/21/14 10:30 PM  Result Value Ref Range Status   Specimen Description BLOOD RIGHT HAND  Final   Special Requests BOTTLES DRAWN AEROBIC ONLY 6CC  Final   Culture NO GROWTH 2 DAYS  Final   Report Status PENDING  Incomplete     Studies: Nm Gastric Emptying  11/23/2014   CLINICAL DATA:  Clinical gastroc [place] cysts, severe vomiting episodes on 20/4 in 25 September, history of diabetes  EXAM: NUCLEAR MEDICINE GASTRIC EMPTYING SCAN  TECHNIQUE: After oral ingestion of radiolabeled meal, sequential abdominal images were obtained for 4 hours. Percentage of activity emptying the stomach was calculated at 1 hour, 2 hour, 3 hour, and 4 hours.  RADIOPHARMACEUTICALS:  2.0 mCi Tc-28m MDP labeled sulfur colloid orally into and whites  COMPARISON:  Abdominal ultrasound of November 21, 2014  FINDINGS: Expected location of the stomach in the left upper quadrant. Ingested meal empties the stomach gradually over the course of the study.  58% emptied at 1 hr ( normal >= 10%)  73% emptied at 2 hr ( normal >= 40%)  74% emptied at 3 hr ( normal >= 70%)  90% emptied at 4 hr ( normal >= 90%)  IMPRESSION: Normal gastric emptying study.   Electronically Signed   By: David  [place] M.D.   On: 11/23/2014 17:19    Scheduled  Meds: . calcium-vitamin D  1 tablet Oral Daily  . cloNIDine  0.1 mg Oral BID  . dextromethorphan-guaiFENesin  1 tablet Oral BID  . doxepin  100 mg Oral QHS  . ezetimibe  10 mg Oral Daily  . fluticasone  1 spray Each Nare Daily  . gabapentin  300 mg Oral TID  . heparin  5,000 Units Subcutaneous 3 times per day  . insulin aspart  0-9 Units Subcutaneous TID WC  . loratadine  10 mg Oral Daily  . methocarbamol  500 mg Oral TID  . multivitamin with minerals  1 tablet Oral Daily  .  pantoprazole (PROTONIX) IV  40 mg Intravenous Q12H  . propranolol  10 mg Oral TID  . senna-docusate  2 tablet Oral BID  . simvastatin  40 mg Oral QPM    Continuous Infusions: . sodium chloride 75 mL/hr at 11/23/14 1610     Time spent:  Xu,Fang MD, PhD  [facility] Pager [phone]. If 7PM-7AM, please contact night-coverage at [email or web address], password [facility] 11/23/2014, 5:49 PM  LOS: 2 days

## 2014-11-23 NOTE — Progress Notes (Signed)
PT Cancellation Note  Patient Details Name: Jodi Chavez MRN: [id number] DOB: [birth year]   Cancelled Treatment:    Reason Eval/Treat Not Completed: Patient at procedure or test/unavailable   Van Clines, PT  Acute Rehabilitation Services Pager [phone] Office [phone]    Van Clines [facility] 11/23/2014, 4:31 PM

## 2014-11-24 DIAGNOSIS — N179 Acute kidney failure, unspecified: Secondary | ICD-10-CM

## 2014-11-24 DIAGNOSIS — F329 Major depressive disorder, single episode, unspecified: Secondary | ICD-10-CM

## 2014-11-24 DIAGNOSIS — R05 Cough: Secondary | ICD-10-CM

## 2014-11-24 DIAGNOSIS — R112 Nausea with vomiting, unspecified: Secondary | ICD-10-CM

## 2014-11-24 DIAGNOSIS — M4722 Other spondylosis with radiculopathy, cervical region: Secondary | ICD-10-CM

## 2014-11-24 LAB — GLUCOSE, CAPILLARY
GLUCOSE-CAPILLARY: 187 mg/dL — AB (ref 65–99)
Glucose-Capillary: 111 mg/dL — ABNORMAL HIGH (ref 65–99)
Glucose-Capillary: 156 mg/dL — ABNORMAL HIGH (ref 65–99)
Glucose-Capillary: 156 mg/dL — ABNORMAL HIGH (ref 65–99)

## 2014-11-24 LAB — CBC WITH DIFFERENTIAL/PLATELET
BASOS PCT: 1 %
Basophils Absolute: 0 10*3/uL (ref 0.0–0.1)
EOS ABS: 0.3 10*3/uL (ref 0.0–0.7)
EOS PCT: 4 %
HCT: 38.2 % (ref 36.0–46.0)
HEMOGLOBIN: 12.7 g/dL (ref 12.0–15.0)
LYMPHS ABS: 1.8 10*3/uL (ref 0.7–4.0)
Lymphocytes Relative: 22 %
MCH: 32.2 pg (ref 26.0–34.0)
MCHC: 33.2 g/dL (ref 30.0–36.0)
MCV: 97 fL (ref 78.0–100.0)
MONOS PCT: 10 %
Monocytes Absolute: 0.8 10*3/uL (ref 0.1–1.0)
NEUTROS PCT: 63 %
Neutro Abs: 5.1 10*3/uL (ref 1.7–7.7)
PLATELETS: 230 10*3/uL (ref 150–400)
RBC: 3.94 MIL/uL (ref 3.87–5.11)
RDW: 13.4 % (ref 11.5–15.5)
WBC: 8 10*3/uL (ref 4.0–10.5)

## 2014-11-24 LAB — BASIC METABOLIC PANEL
Anion gap: 9 (ref 5–15)
BUN: 6 mg/dL (ref 6–20)
CALCIUM: 8.5 mg/dL — AB (ref 8.9–10.3)
CO2: 26 mmol/L (ref 22–32)
CREATININE: 0.91 mg/dL (ref 0.44–1.00)
Chloride: 101 mmol/L (ref 101–111)
Glucose, Bld: 104 mg/dL — ABNORMAL HIGH (ref 65–99)
Potassium: 3.6 mmol/L (ref 3.5–5.1)
SODIUM: 136 mmol/L (ref 135–145)

## 2014-11-24 LAB — MAGNESIUM: MAGNESIUM: 1.3 mg/dL — AB (ref 1.7–2.4)

## 2014-11-24 MED ORDER — MAGNESIUM SULFATE 50 % IJ SOLN
4.0000 g | Freq: Once | INTRAMUSCULAR | Status: DC
  Filled 2014-11-24: qty 8

## 2014-11-24 MED ORDER — PANTOPRAZOLE SODIUM 40 MG PO TBEC
40.0000 mg | DELAYED_RELEASE_TABLET | Freq: Every day | ORAL | Status: DC
  Administered 2014-11-24 – 2014-11-25 (×2): 40 mg via ORAL
  Filled 2014-11-24 (×2): qty 1

## 2014-11-24 MED ORDER — MAGNESIUM SULFATE 4 GM/100ML IV SOLN
4.0000 g | Freq: Once | INTRAVENOUS | Status: AC
  Administered 2014-11-24: 4 g via INTRAVENOUS
  Filled 2014-11-24: qty 100

## 2014-11-24 NOTE — Progress Notes (Signed)
Physical Therapy Treatment Patient Details Name: Jodi Chavez MRN: [id number] DOB: 14-Feb-1951 Today's Date: 11/24/2014    History of Present Illness HPI: Jodi Chavez is a 64 y.o. female with PMH of hypertension, hyperlipidemia, diabetes mellitus, depression, migraine headache, diabetic neuropathy, chronic back pain, insomnia, who presents with nausea, vomiting, abdominal pain. Sepsis, possible UTI, cough, AKI    PT Comments    Noting progress with mobility, especially amb distance; Agree with dc plan change to HHPT follow up, as pt lives alone, and HH therapies can help with transition home, tips for managing in her own environment  Follow Up Recommendations  Home health PT     Equipment Recommendations  3in1 (PT)    Recommendations for Other Services       Precautions / Restrictions Precautions Precautions: Fall Precaution Comments: Fall greatly reduced with use of RW    Mobility  Bed Mobility Overal bed mobility: Needs Assistance Bed Mobility: Rolling;Sidelying to Sit Rolling: Supervision Sidelying to sit: Min guard       General bed mobility comments: Pt required verbal cues for log roll technique, relied on bed rail for sidelying to sit and min assist to help LE's on/off of bed.  Transfers Overall transfer level: Needs assistance Equipment used: Rolling walker (2 wheeled) Transfers: Sit to/from Stand Sit to Stand: Supervision         General transfer comment: Cues for safety and hand placement; cues to control descent to lower commode  Ambulation/Gait Ambulation/Gait assistance: Min guard;Supervision Ambulation Distance (Feet): 85 Feet Assistive device: Rolling walker (2 wheeled) Gait Pattern/deviations: Step-through pattern Gait velocity: slowed   General Gait Details: Tending to keep rW too far infront of her; cues for better use; obtained a youth-sized RW for more optimal fit   Stairs            Wheelchair Mobility    Modified Rankin  (Stroke Patients Only)       Balance     Sitting balance-Leahy Scale: Good       Standing balance-Leahy Scale: Poor (approaching Fair)                      Cognition Arousal/Alertness: Awake/alert Behavior During Therapy: WFL for tasks assessed/performed Overall Cognitive Status: Within Functional Limits for tasks assessed                      Exercises      General Comments        Pertinent Vitals/Pain Pain Assessment: No/denies pain    Home Living                      Prior Function            PT Goals (current goals can now be found in the care plan section) Acute Rehab PT Goals Patient Stated Goal: to be able to move better PT Goal Formulation: With patient Time For Goal Achievement: 12/06/14 Potential to Achieve Goals: Good Progress towards PT goals: Progressing toward goals    Frequency  Min 3X/week    PT Plan Discharge plan needs to be updated    Co-evaluation             End of Session Equipment Utilized During Treatment: Gait belt Activity Tolerance: Patient tolerated treatment well Patient left: in chair;with call bell/phone within reach     Time: 1448-1520 PT Time Calculation (min) (ACUTE ONLY): 32 min  Charges:  $Gait Training: 8-22  mins $Therapeutic Activity: 8-22 mins                    G Codes:      Van Clines [facility] 11/24/2014, 5:05 PM  Van Clines, [place]  Acute Rehabilitation Services Pager [phone] Office [phone]

## 2014-11-24 NOTE — Care Management Important Message (Signed)
Important Message  Patient Details  Name: Jodi Chavez MRN: [id number] Date of Birth: 17-Aug-1950   Medicare Important Message Given:  Yes-second notification given    Orson Aloe 11/24/2014, 12:49 PM

## 2014-11-24 NOTE — Progress Notes (Signed)
[facility]                                                                              Patient Demographics  Jodi Chavez, is a 64 y.o. female, DOB - [birth year], RUE:[id number]  Admit date - 11/21/2014   Admitting Physician Lorretta Harp, MD  Outpatient Primary MD for the patient is Dorrene German, MD  LOS - 3   Chief Complaint  Patient presents with  . Emesis       Brief HPI   Jodi Chavez is a 64 y.o. female with PMH of hypertension, hyperlipidemia, diabetes mellitus, depression, migraine headache, diabetic neuropathy, chronic back pain, insomnia, who presents with nausea, vomiting, abdominal pain. Patient reports that she has been having persistent nausea, vomiting, abdominal pain in the past 3 days. She vomited more than 10 times on the day of admission, without blood in the vomitus. She had mild abdominal pain over epigastric area. She reported 2 loose stools this morning that were not overtly dark or bloody. Denied fevers, chills, vaginal bleeding or discharge. Patient had mild dry cough and mild shortness of breath. No chest pain. Her shortness of breath has resolved. She had increased urinary frequency, but no dysuria or burning on urination. No rashes or unilateral weakness. Patient was seen in Ed yesterday and had CT-abd/pelvis, which should cholelithiasis without complicating factor and fat containing umbilical hernia. In ED, patient was found to have WBC 12.4, lactate 2.06 on 9/24-->0.82 on the day of admission tachycardia, temperature normal, lipase 33, AKI, positive urinalysis with small amount of leukocytes. Patient was admitted to inpatient for further reevaluation and treatment.    Assessment & Plan    Principal Problem:  Nausea, vomiting, abdominal pain: Unclear etiology - CT abdomen and pelvis negative for any acute pathology, showed cholelithiasis without any cardiac bleeding factors - Ultrasound abdomen showed cholelithiasis without any  sonographic findings of acute cholecystitis. - Lipase was normal at the time of admission - Gastric empty study normal - Patient had been tolerating clear liquid diet since yesterday, advanced to full liquid diet today  Hypomagnesemia - Replaced IV  Acute kidney injury: likely from dehydration, procalcitonin less than 0.1 - Resolved with hydration.  Seasonal allergies with postnasal drip - Continue flonase/claritin/cepacol prn/mucinex.  - Chest x-ray normal  NIDDM2 - Hemoglobin A1c 7, continue sliding scale insulin  Lactic acidosis:  - Could be possible from dehydration or metformin. Resolved with IV fluids   HTN:  - continue betablocker/clonidine,  -lotensin/hctz initially held, restarted from 9/27  Left knee pain, prn pain meds, has been getting PT as outpatient. No erythema, no edema on exam.   Code Status: Full CODE STATUS  Family Communication: Discussed in detail with the patient, all imaging results, lab results explained to the patient    Disposition Plan: Hopefully DC today or tomorrow if tolerating full liquid diet, patient can advance slowly to solids at home  Time Spent in minutes   25 minutes  Procedures   CT abdomen and pelvis Ultrasound abdomen Nuclear medicine gastric imaging study  Consults   None  DVT Prophylaxis Lovenox  Medications  Scheduled Meds: . benazepril  40 mg Oral QPM  . calcium-vitamin D  1 tablet Oral Daily  . cloNIDine  0.1 mg Oral BID  . dextromethorphan-guaiFENesin  1 tablet Oral BID  . doxepin  100 mg Oral QHS  . enoxaparin (LOVENOX) injection  40 mg Subcutaneous Q24H  . ezetimibe  10 mg Oral Daily  . fluticasone  1 spray Each Nare Daily  . gabapentin  300 mg Oral TID  . hydrochlorothiazide  25 mg Oral Daily  . insulin aspart  0-9 Units Subcutaneous TID WC  . loratadine  10 mg Oral Daily  . methocarbamol  500 mg Oral TID  . multivitamin with minerals  1 tablet Oral Daily  . pantoprazole  40 mg Oral Q1200  .  polyethylene glycol  17 g Oral Daily  . propranolol  10 mg Oral TID  . senna-docusate  2 tablet Oral BID  . simvastatin  40 mg Oral QPM   Continuous Infusions:  PRN Meds:.acetaminophen, albuterol, diphenhydrAMINE, hydrALAZINE, menthol-cetylpyridinium, morphine injection, ondansetron (ZOFRAN) IV, sodium chloride, technetium sulfur colloid, traMADol   Antibiotics   Anti-infectives    Start     Dose/Rate Route Frequency Ordered Stop   11/22/14 1200  vancomycin (VANCOCIN) IVPB 750 mg/150 ml premix  Status:  Discontinued     750 mg 150 mL/hr over 60 Minutes Intravenous Every 12 hours 11/21/14 2137 11/22/14 1952   11/21/14 2300  vancomycin (VANCOCIN) 1,500 mg in sodium chloride 0.9 % 500 mL IVPB     1,500 mg 250 mL/hr over 120 Minutes Intravenous  Once 11/21/14 2137 11/22/14 0156   11/21/14 2200  piperacillin-tazobactam (ZOSYN) IVPB 3.375 g  Status:  Discontinued     3.375 g 12.5 mL/hr over 240 Minutes Intravenous 3 times per day 11/21/14 2137 11/22/14 1952   11/21/14 2015  cefTRIAXone (ROCEPHIN) 1 g in dextrose 5 % 50 mL IVPB     1 g 100 mL/hr over 30 Minutes Intravenous  Once 11/21/14 2014 11/21/14 2032        Subjective:   Jodi Chavez was seen and examined today. Still states having generalized abdominal discomfort, but has been tolerating clear liquid diet without any difficulty, no documented episodes of emesis. No fevers or chills. Patient denies dizziness, chest pain, shortness of breath, new weakness, numbess, tingling. No acute events overnight.    Objective:   Blood pressure 163/84, pulse 84, temperature 98.7 F (37.1 C), temperature source Oral, resp. rate 18, height  (1.549 m), weight 95.8 kg (211 lb 3.2 oz), SpO2 95 %.  Wt Readings from Last 3 Encounters:  11/23/14 95.8 kg (211 lb 3.2 oz)  11/20/14 97.523 kg (215 lb)  01/06/13 99.791 kg (220 lb)     Intake/Output Summary (Last 24 hours) at 11/24/14 1103 Last data filed at 11/24/14 1000  Gross per 24 hour    Intake   2280 ml  Output   2650 ml  Net   -370 ml    Exam  General: Alert and oriented x 3, NAD  HEENT:  PERRLA, EOMI, Anicteric Sclera, mucous membranes moist.   Neck: Supple, no JVD, no masses  CVS: S1 S2 auscultated, no rubs, murmurs or gallops. Regular rate and rhythm.  Respiratory: Clear to auscultation bilaterally, no wheezing, rales or rhonchi  Abdomen: Soft, subjective mild generalized tenderness, nondistended, + bowel sounds  Ext: no cyanosis clubbing or edema  Neuro: AAOx3, Cr N's II- XII. Strength 5/5 upper and lower extremities bilaterally  Skin: No rashes  Psych: Normal affect and demeanor, alert  and oriented x3    Data Review   Micro Results Recent Results (from the past 240 hour(s))  Urine culture     Status: None   Collection Time: 11/21/14  6:35 PM  Result Value Ref Range Status   Specimen Description URINE, RANDOM  Final   Special Requests NONE  Final   Culture MULTIPLE SPECIES PRESENT, SUGGEST RECOLLECTION  Final   Report Status 11/23/2014 FINAL  Final  Culture, blood (routine x 2) Call MD if unable to obtain prior to antibiotics being given     Status: None (Preliminary result)   Collection Time: 11/21/14 10:20 PM  Result Value Ref Range Status   Specimen Description BLOOD RIGHT ARM  Final   Special Requests BOTTLES DRAWN AEROBIC AND ANAEROBIC 6CC   Final   Culture NO GROWTH 2 DAYS  Final   Report Status PENDING  Incomplete  Culture, blood (routine x 2) Call MD if unable to obtain prior to antibiotics being given     Status: None (Preliminary result)   Collection Time: 11/21/14 10:30 PM  Result Value Ref Range Status   Specimen Description BLOOD RIGHT HAND  Final   Special Requests BOTTLES DRAWN AEROBIC ONLY 6CC  Final   Culture NO GROWTH 2 DAYS  Final   Report Status PENDING  Incomplete    Radiology Reports Dg Chest 2 View  11/22/2014   CLINICAL DATA:  Cough.  Nausea and vomiting  EXAM: CHEST  2 VIEW  COMPARISON:  10/12/2011  FINDINGS:  Chronic hypoventilation. There is no edema, consolidation, effusion, or pneumothorax. Normal heart size and mediastinal contours.  IMPRESSION: 1. No acute finding. 2. Low volume chest.   Electronically Signed   By: Marnee Spring M.D.   On: 11/22/2014 00:41   Nm Gastric Emptying  11/23/2014   CLINICAL DATA:  Clinical gastroc [place] cysts, severe vomiting episodes on 20/4 in 25 September, history of diabetes  EXAM: NUCLEAR MEDICINE GASTRIC EMPTYING SCAN  TECHNIQUE: After oral ingestion of radiolabeled meal, sequential abdominal images were obtained for 4 hours. Percentage of activity emptying the stomach was calculated at 1 hour, 2 hour, 3 hour, and 4 hours.  RADIOPHARMACEUTICALS:  2.0 mCi Tc-2m MDP labeled sulfur colloid orally into and whites  COMPARISON:  Abdominal ultrasound of November 21, 2014  FINDINGS: Expected location of the stomach in the left upper quadrant. Ingested meal empties the stomach gradually over the course of the study.  58% emptied at 1 hr ( normal >= 10%)  73% emptied at 2 hr ( normal >= 40%)  74% emptied at 3 hr ( normal >= 70%)  90% emptied at 4 hr ( normal >= 90%)  IMPRESSION: Normal gastric emptying study.   Electronically Signed   By: David  [place] M.D.   On: 11/23/2014 17:19   US Abdomen Complete  11/22/2014   CLINICAL DATA:  Intractable nausea and vomiting. Abdominal pain over epigastric area. Symptoms for 2 days.  EXAM: ULTRASOUND ABDOMEN COMPLETE  COMPARISON:  CT 1 day prior.  FINDINGS: Gallbladder: Physiologically distended with sludge and small stones layering dependently and in the gallbladder neck. No gallbladder wall thickening, wall thickness of 2 mm. Sonographic Eulah Pont sign is negative.  Common bile duct: Diameter: 2 mm.  Liver: No focal lesion identified. Diffusely increased and heterogeneous in parenchymal echogenicity.  IVC: No abnormality visualized, poorly demonstrated.  Pancreas: Not visualized.  Spleen: Size and appearance within normal limits.  Right Kidney:  Length: 10.6 cm. Echogenicity within normal limits. No mass or hydronephrosis  visualized.  Left Kidney: Length: 11.1 cm. Echogenicity within normal limits. No mass or hydronephrosis visualized.  Abdominal aorta: No aneurysm visualized. Mid and distal aorta are obscured.  Other findings: None. No ascites. Technically difficult and limited exam due to body habitus and difficulty with breath hold technique.  IMPRESSION: 1. Cholelithiasis without sonographic findings of acute cholecystitis. 2. Hepatic steatosis. 3. Midline structures including the pancreas are obscured.   Electronically Signed   By: Rubye Oaks M.D.   On: 11/22/2014 00:37   Ct Abdomen Pelvis W Contrast  11/20/2014   CLINICAL DATA:  Abdominal pain for 2-3 days with diarrhea  EXAM: CT ABDOMEN AND PELVIS WITH CONTRAST  TECHNIQUE: Multidetector CT imaging of the abdomen and pelvis was performed using the standard protocol following bolus administration of intravenous contrast.  CONTRAST:  80mL OMNIPAQUE IOHEXOL 300 MG/ML  SOLN  COMPARISON:  None.  FINDINGS: Lung bases are free of acute infiltrate or sizable effusion.  The gallbladder is well distended and demonstrates a few dependent gallstones. No complicating factors are noted. The liver, spleen, adrenal glands and pancreas are otherwise within normal limits. The kidneys are well visualized bilaterally within normal enhancement pattern. Normal excretion is noted bilaterally.  The appendix is not well visualized although no inflammatory changes are seen. A small fat containing umbilical hernia is noted. The bladder is partially distended. No pelvic mass lesion or sidewall abnormality is noted. Chronic degenerative changes of both hip joints are seen right greater than left. Degenerative changes of the lumbar spine are noted.  IMPRESSION: Cholelithiasis without complicating factors.  Fat containing umbilical hernia.  No other focal abnormality   Electronically Signed   By: Alcide Clever M.D.   On:  11/20/2014 19:04    CBC  Recent Labs Lab 11/20/14 1129 11/21/14 1653 11/23/14 0438 11/23/14 0750 11/24/14 0516  WBC 12.0* 12.4* 9.5 9.5 8.0  HGB 15.7* 14.3 12.8 12.8 12.7  HCT 44.4 41.5 38.3 38.3 38.2  PLT 301 281 225 228 230  MCV 95.1 95.4 98.5 98.5 97.0  MCH 33.6 32.9 32.9 32.9 32.2  MCHC 35.4 34.5 33.4 33.4 33.2  RDW 13.1 13.2 13.9 13.8 13.4  LYMPHSABS 1.8  --   --   --  1.8  MONOABS 0.8  --   --   --  0.8  EOSABS 0.2  --   --   --  0.3  BASOSABS 0.0  --   --   --  0.0    Chemistries   Recent Labs Lab 11/20/14 1129 11/21/14 1653 11/22/14 0925 11/22/14 1411 11/23/14 0438 11/24/14 0516  NA 135 137 139 139 136 136  K 3.6 3.5 3.6 3.6 4.4 3.6  CL 96* 101 105 104 103 101  CO2 24 22 23 26 24 26   GLUCOSE 184* 173* 124* 121* 99 104*  BUN 14 11 10 9 7 6   CREATININE 1.36* 1.20* 0.99 0.93 0.96 0.91  CALCIUM 9.8 8.8* 7.8* 7.9* 7.9* 8.5*  MG  --   --  0.8* 1.6* 1.8 1.3*  AST 34 28  --   --   --   --   ALT 29 24  --   --   --   --   ALKPHOS 83 75  --   --   --   --   BILITOT 0.7 0.9  --   --   --   --    ------------------------------------------------------------------------------------------------------------------ estimated creatinine clearance is 66.9 mL/Chavez (by C-G formula based on Cr of 0.91). ------------------------------------------------------------------------------------------------------------------  Recent Labs  11/22/14 0138  HGBA1C 7.0*   ------------------------------------------------------------------------------------------------------------------ No results for input(s): CHOL, HDL, LDLCALC, TRIG, CHOLHDL, LDLDIRECT in the last 72 hours. ------------------------------------------------------------------------------------------------------------------  Recent Labs  11/22/14 0925  TSH 0.580   ------------------------------------------------------------------------------------------------------------------ No results for input(s): VITAMINB12,  FOLATE, FERRITIN, TIBC, IRON, RETICCTPCT in the last 72 hours.  Coagulation profile  Recent Labs Lab 11/21/14 2220  INR 1.13    No results for input(s): DDIMER in the last 72 hours.  Cardiac Enzymes No results for input(s): CKMB, TROPONINI, MYOGLOBIN in the last 168 hours.  Invalid input(s): CK ------------------------------------------------------------------------------------------------------------------ Invalid input(s): POCBNP   Recent Labs  11/22/14 2124 11/23/14 0829 11/23/14 1136 11/23/14 1748 11/23/14 2151 11/24/14 0752  GLUCAP 150* 96 112* 120* 148* 111*     RAI,RIPUDEEP M.D. [facility] 11/24/2014, 11:03 AM  Pager: [phone] Between 7am to 7pm - call Pager - [phone]  After 7pm go to [email or web address] - password [email or web address]  Call night coverage person covering after 7pm

## 2014-11-25 DIAGNOSIS — E785 Hyperlipidemia, unspecified: Secondary | ICD-10-CM

## 2014-11-25 DIAGNOSIS — I10 Essential (primary) hypertension: Secondary | ICD-10-CM

## 2014-11-25 LAB — GLUCOSE, CAPILLARY
Glucose-Capillary: 128 mg/dL — ABNORMAL HIGH (ref 65–99)
Glucose-Capillary: 214 mg/dL — ABNORMAL HIGH (ref 65–99)
Glucose-Capillary: 183 mg/dL — ABNORMAL HIGH (ref 65–99)

## 2014-11-25 MED ORDER — POLYETHYLENE GLYCOL 3350 17 G PO PACK: 17.0000 g | PACK | Freq: Every day | ORAL | Status: DC

## 2014-11-25 MED ORDER — MAGNESIUM OXIDE 400 (241.3 MG) MG PO TABS
400.0000 mg | ORAL_TABLET | Freq: Two times a day (BID) | ORAL | Status: DC
  Administered 2014-11-25: 400 mg via ORAL
  Filled 2014-11-25: qty 1

## 2014-11-25 MED ORDER — PANTOPRAZOLE SODIUM 40 MG PO TBEC: 40.0000 mg | DELAYED_RELEASE_TABLET | Freq: Every day | ORAL | Status: DC

## 2014-11-25 MED ORDER — PANTOPRAZOLE SODIUM 40 MG PO TBEC: 40.0000 mg | DELAYED_RELEASE_TABLET | Freq: Every day | ORAL | Status: AC

## 2014-11-25 MED ORDER — TRAMADOL HCL 50 MG PO TABS: 50.0000 mg | ORAL_TABLET | Freq: Four times a day (QID) | ORAL | Status: DC | PRN

## 2014-11-25 MED ORDER — DM-GUAIFENESIN ER 30-600 MG PO TB12: 1.0000 | ORAL_TABLET | Freq: Two times a day (BID) | ORAL | Status: DC

## 2014-11-25 MED ORDER — PROMETHAZINE HCL 25 MG PO TABS: 25.0000 mg | ORAL_TABLET | Freq: Four times a day (QID) | ORAL | Status: DC | PRN

## 2014-11-25 MED ORDER — MAGNESIUM OXIDE 400 (241.3 MG) MG PO TABS: 400.0000 mg | ORAL_TABLET | Freq: Two times a day (BID) | ORAL | Status: DC

## 2014-11-25 MED ORDER — DIPHENHYDRAMINE HCL 25 MG PO TABS
25.0000 mg | ORAL_TABLET | Freq: Four times a day (QID) | ORAL | Status: DC | PRN
Start: 2014-11-25 — End: 2022-08-11

## 2014-11-25 NOTE — Care Management Note (Signed)
Case Management Note  Patient Details  Name: Jodi Chavez MRN: [id number] Date of Birth: 1950/12/27  Subjective/Objective:         CM following for progression and d/c planning.           Action/Plan: Pt for d/c to home today, DME and HH arranged, after discussion with pt on 11/24/2014.  Expected Discharge Date:  11/25/2014           Expected Discharge Plan:  Home w Home Health Services  In-House Referral:     Discharge planning Services  CM Consult  Post Acute Care Choice:  Home Health Choice offered to:  Patient  DME Arranged:   3:1 [employer] DME Agency:   [facility]  HH Arranged:  PT, RN OT , HH aide. HH Agency:  [facility]  Status of Service:  Completed, signed off  Medicare Important Message Given:  Yes-second notification given Date Medicare IM Given:    Medicare IM give by:    Date Additional Medicare IM Given:    Additional Medicare Important Message give by:     If discussed at Long Length of Stay Meetings, dates discussed:    Additional Comments:  Starlyn Skeans, RN 11/25/2014, 12:11 PM

## 2014-11-25 NOTE — Progress Notes (Signed)
Physical Therapy Treatment Patient Details Name: Jodi Chavez MRN: [id number] DOB: 1950-12-26 Today's Date: 11/25/2014    History of Present Illness HPI: Jodi Chavez is a 64 y.o. female with PMH of hypertension, hyperlipidemia, diabetes mellitus, depression, migraine headache, diabetic neuropathy, chronic back pain, insomnia, who presents with nausea, vomiting, abdominal pain. Sepsis, possible UTI, cough, AKI    PT Comments    Pt making steady progress.  Follow Up Recommendations  Home health PT;Supervision/Assistance - 24 hour     Equipment Recommendations  3in1 (PT)    Recommendations for Other Services       Precautions / Restrictions Precautions Precautions: Fall Precaution Comments: Fall greatly reduced with use of RW    Mobility  Bed Mobility Overal bed mobility: Needs Assistance Bed Mobility: Rolling;Sidelying to Sit Rolling: Supervision Sidelying to sit: Min assist   Sit to supine: Min assist   General bed mobility comments: Assist to bring legs on/off bed.  Transfers Overall transfer level: Needs assistance Equipment used: Rolling walker (2 wheeled) Transfers: Sit to/from Stand Sit to Stand: Supervision;Min assist         General transfer comment: Assist to bring hips up from low commode  Ambulation/Gait Ambulation/Gait assistance: Min guard Ambulation Distance (Feet): 75 Feet Assistive device: Rolling walker (2 wheeled) Gait Pattern/deviations: Step-through pattern;Decreased step length - right;Decreased step length - left;Decreased stance time - left Gait velocity: slowed Gait velocity interpretation: Below normal speed for age/gender General Gait Details: Pt with left knee pain.   Stairs            Wheelchair Mobility    Modified Rankin (Stroke Patients Only)       Balance     Sitting balance-Leahy Scale: Good     Standing balance support: Bilateral upper extremity supported Standing balance-Leahy Scale: Poor Standing  balance comment: requires UE support                    Cognition Arousal/Alertness: Awake/alert Behavior During Therapy: WFL for tasks assessed/performed Overall Cognitive Status: Within Functional Limits for tasks assessed                      Exercises      General Comments        Pertinent Vitals/Pain Pain Assessment: 0-10 Pain Score: 8  Pain Location: lt knee Pain Descriptors / Indicators: Aching;Throbbing Pain Intervention(s): Limited activity within patient's tolerance;Monitored during session;Repositioned    Home Living                      Prior Function            PT Goals (current goals can now be found in the care plan section) Acute Rehab PT Goals Patient Stated Goal: to be able to move better PT Goal Formulation: With patient Time For Goal Achievement: 12/06/14 Potential to Achieve Goals: Good Progress towards PT goals: Progressing toward goals    Frequency  Min 3X/week    PT Plan Current plan remains appropriate    Co-evaluation             End of Session   Activity Tolerance: Patient tolerated treatment well Patient left: with call bell/phone within reach;in bed;with bed alarm set     Time:  -     Charges:  $Gait Training: 23-37 mins                    G Codes:  Jodi Chavez 11/25/2014, 4:06 PM [employer] PT [phone]

## 2014-11-25 NOTE — Discharge Summary (Addendum)
Physician Discharge Summary   Patient ID: Jodi Chavez MRN: [id number] DOB/AGE: 63-May-1952 63 y.o.  Admit date: 11/21/2014 Discharge date: 11/25/2014  Primary Care Physician:  Dorrene German, MD  Discharge Diagnoses:      Chronic nausea with abdominal pain of unclear etiology   Hypomagnesemia    Acute kidney injury likely from dehydration    Dehydration    Lactic acidosis  . Essential hypertension . Cervical spondylosis with radiculopathy . HLD (hyperlipidemia) . Depression   Consults: None   Recommendations for Outpatient Follow-up:  Patient may need referral for gastroenterology for endoscopy if continues to have recurring symptoms. Patient had gastric empty study, CT abdomen and pelvis, abdominal ultrasound all negative except for cholelithiasis without any acute cholecystitis. PCP appointment was scheduled for the patient prior to discharge on 12/08/14 as she reported that she has difficulty getting the appointments.   TESTS THAT NEED FOLLOW-UP CBC, BMET   DIET: Patient was recommended to advance diet slowly to soft solids over the next few days    Allergies:   Allergies  Allergen Reactions  . Naproxen Sodium Rash    anaprox     Discharge Medications:   Medication List    STOP taking these medications        metFORMIN 850 MG tablet  Commonly known as:  GLUCOPHAGE     methocarbamol 500 MG tablet  Commonly known as:  ROBAXIN     MOVIPREP 100 G Solr  Generic drug:  peg 3350 powder     ondansetron 4 MG tablet  Commonly known as:  ZOFRAN     oxyCODONE 5 MG immediate release tablet  Commonly known as:  Oxy IR/ROXICODONE      TAKE these medications        acetaminophen 500 MG tablet  Commonly known as:  TYLENOL  Take 500 mg by mouth every 6 (six) hours as needed for pain.     benazepril 40 MG tablet  Commonly known as:  LOTENSIN  Take 40 mg by mouth every evening.     calcium-vitamin D 500-200 MG-UNIT tablet  Commonly known as:  OSCAL WITH  D  Take 1 tablet by mouth daily.     cloNIDine 0.2 MG tablet  Commonly known as:  CATAPRES  Take 0.1 mg by mouth 2 (two) times daily.     dextromethorphan-guaiFENesin 30-600 MG 12hr tablet  Commonly known as:  MUCINEX DM  Take 1 tablet by mouth 2 (two) times daily.     diphenhydrAMINE 25 MG tablet  Commonly known as:  BENADRYL  Take 1 tablet (25 mg total) by mouth every 6 (six) hours as needed for itching.     doxepin 50 MG capsule  Commonly known as:  SINEQUAN  Take 100 mg by mouth at bedtime.     ezetimibe 10 MG tablet  Commonly known as:  ZETIA  Take 10 mg by mouth daily.     fluticasone 50 MCG/ACT nasal spray  Commonly known as:  FLONASE  Place 2 sprays into both nostrils daily.     gabapentin 300 MG capsule  Commonly known as:  NEURONTIN  Take 300 mg by mouth 3 (three) times daily.     Garlic 1000 MG Caps  Take 1 capsule by mouth 3 (three) times daily.     glimepiride 2 MG tablet  Commonly known as:  AMARYL  Take 2 mg by mouth daily before breakfast.     GLUCOSAMINE CHONDR 1500 COMPLX PO  Take by mouth.  hydrochlorothiazide 25 MG tablet  Commonly known as:  HYDRODIURIL  Take 25 mg by mouth daily.     loratadine 10 MG tablet  Commonly known as:  CLARITIN  Take 10 mg by mouth daily.     magnesium oxide 400 (241.3 MG) MG tablet  Commonly known as:  MAG-OX  Take 1 tablet (400 mg total) by mouth 2 (two) times daily.     meloxicam 7.5 MG tablet  Commonly known as:  MOBIC  Take 7.5 mg by mouth 2 (two) times daily as needed for pain.     multivitamin with minerals Tabs tablet  Take 1 tablet by mouth daily.     OVER THE COUNTER MEDICATION  Apply 1 application topically daily. sween cream applied to groin area daily to prevent rash     pantoprazole 40 MG tablet  Commonly known as:  PROTONIX  Take 1 tablet (40 mg total) by mouth daily.     polyethylene glycol packet  Commonly known as:  MIRALAX / GLYCOLAX  Take 17 g by mouth daily.     PROAIR HFA  108 (90 BASE) MCG/ACT inhaler  Generic drug:  albuterol  Inhale 2 puffs into the lungs every 4 (four) hours as needed.     promethazine 25 MG tablet  Commonly known as:  PHENERGAN  Take 1 tablet (25 mg total) by mouth every 6 (six) hours as needed for nausea.     propranolol 10 MG tablet  Commonly known as:  INDERAL  Take 10 mg by mouth 3 (three) times daily.     Sennosides 25 MG Tabs  Take 2 tablets by mouth as needed. Pt takes every other day to every 3 days     simvastatin 40 MG tablet  Commonly known as:  ZOCOR  Take 40 mg by mouth every evening.     traMADol 50 MG tablet  Commonly known as:  ULTRAM  Take 1 tablet (50 mg total) by mouth every 6 (six) hours as needed.         Brief H and P: For complete details please refer to admission H and P, but in brief Jodi Chavez is a 64 y.o. female with PMH of hypertension, hyperlipidemia, diabetes mellitus, depression, migraine headache, diabetic neuropathy, chronic back pain, insomnia, who presents with nausea, vomiting, abdominal pain. Patient reports that she has been having persistent nausea, vomiting, abdominal pain in the past 3 days. She vomited more than 10 times on the day of admission, without blood in the vomitus. She had mild abdominal pain over epigastric area. She reported 2 loose stools this morning that were not overtly dark or bloody. Denied fevers, chills, vaginal bleeding or discharge. Patient had mild dry cough and mild shortness of breath. No chest pain. Her shortness of breath has resolved. She had increased urinary frequency, but no dysuria or burning on urination. No rashes or unilateral weakness. Patient was seen in Ed yesterday and had CT-abd/pelvis, which should cholelithiasis without complicating factor and fat containing umbilical hernia. In ED, patient was found to have WBC 12.4, lactate 2.06 on 9/24-->0.82 on the day of admission tachycardia, temperature normal, lipase 33, AKI, positive urinalysis with  small amount of leukocytes. Patient was admitted to inpatient for further reevaluation and treatment.  Hospital Course:  Nausea, vomiting, abdominal pain: Unclear etiology - CT abdomen and pelvis negative for any acute pathology, showed cholelithiasis without any cardiac bleeding factors - Ultrasound abdomen showed cholelithiasis without any sonographic findings of acute cholecystitis. - Lipase  was normal at the time of admission. LFTs were normal - Gastric empty study normal. Patient has been tolerating full liquid diet for more than 24 hours without any difficulty. She was recommended to advance diet to soft solids over the next few days. She may need GI referral for endoscopy if patient continues to have recurring symptoms of nausea and abdominal pain.   Hypomagnesemia - Replaced IV and patient was given prescription for oral magnesium replacement. Patient's magnesium level was 0.8 at the time of admission.  Acute kidney injury: likely from dehydration, procalcitonin less than 0.1 -Creatinine function was 1.36 at the time of admission, resolved with hydration . Creatinine 0.91 at the time of discharge.   Seasonal allergies with postnasal drip - Continue flonase/claritin/cepacol prn/mucinex.  - Chest x-ray normal  NIDDM2 - Hemoglobin A1c 7, patient was placed on sliding scale insulin while inpatient. She will continue Amaryl however metformin has been discontinued due to her complaints of GI upset, nausea and lactic acidosis at the time of admission.  Lactic acidosis:  - Could be possible from dehydration or metformin. Resolved with IV fluids   HTN:  - continue betablocker/clonidine,  -lotensin/hctz initially held, restarted from 9/27  Left knee pain, prn pain meds, has been getting PT as outpatient. No erythema, no edema on exam. Home health PT, OT, home health RN, aide were arranged by the case management.   Day of Discharge BP 143/67 mmHg  Pulse 76  Temp(Src) 98.4 F  (36.9 C) (Oral)  Resp 18  Ht  (1.549 m)  Wt 97.387 kg (214 lb 11.2 oz)  BMI 40.59 kg/m2  SpO2 95%  Physical Exam: General: Alert and awake oriented x3 not in any acute distress. HEENT: anicteric sclera, pupils reactive to light and accommodation CVS: S1-S2 clear no murmur rubs or gallops Chest: clear to auscultation bilaterally, no wheezing rales or rhonchi Abdomen: soft nontender, nondistended, normal bowel sounds Extremities: no cyanosis, clubbing or edema noted bilaterally Neuro: Cranial nerves II-XII intact, no focal neurological deficits   The results of significant diagnostics from this hospitalization (including imaging, microbiology, ancillary and laboratory) are listed below for reference.    LAB RESULTS: Basic Metabolic Panel:  Recent Labs Lab 11/23/14 0438 11/24/14 0516  NA 136 136  K 4.4 3.6  CL 103 101  CO2 24 26  GLUCOSE 99 104*  BUN 7 6  CREATININE 0.96 0.91  CALCIUM 7.9* 8.5*  MG 1.8 1.3*   Liver Function Tests:  Recent Labs Lab 11/20/14 1129 11/21/14 1653  AST 34 28  ALT 29 24  ALKPHOS 83 75  BILITOT 0.7 0.9  PROT 7.9 7.2  ALBUMIN 4.0 3.7    Recent Labs Lab 11/20/14 1129 11/21/14 1653  LIPASE 40 33   No results for input(s): AMMONIA in the last 168 hours. CBC:  Recent Labs Lab 11/23/14 0750 11/24/14 0516  WBC 9.5 8.0  NEUTROABS  --  5.1  HGB 12.8 12.7  HCT 38.3 38.2  MCV 98.5 97.0  PLT 228 230   Cardiac Enzymes: No results for input(s): CKTOTAL, CKMB, CKMBINDEX, TROPONINI in the last 168 hours. BNP: Invalid input(s): POCBNP CBG:  Recent Labs Lab 11/24/14 2248 11/25/14 0833  GLUCAP 187* 128*    Significant Diagnostic Studies:  Dg Chest 2 View  11/22/2014   CLINICAL DATA:  Cough.  Nausea and vomiting  EXAM: CHEST  2 VIEW  COMPARISON:  10/12/2011  FINDINGS: Chronic hypoventilation. There is no edema, consolidation, effusion, or pneumothorax. Normal heart size and  mediastinal contours.  IMPRESSION: 1. No acute  finding. 2. Low volume chest.   Electronically Signed   By: Marnee Spring M.D.   On: 11/22/2014 00:41   US Abdomen Complete  11/22/2014   CLINICAL DATA:  Intractable nausea and vomiting. Abdominal pain over epigastric area. Symptoms for 2 days.  EXAM: ULTRASOUND ABDOMEN COMPLETE  COMPARISON:  CT 1 day prior.  FINDINGS: Gallbladder: Physiologically distended with sludge and small stones layering dependently and in the gallbladder neck. No gallbladder wall thickening, wall thickness of 2 mm. Sonographic Eulah Pont sign is negative.  Common bile duct: Diameter: 2 mm.  Liver: No focal lesion identified. Diffusely increased and heterogeneous in parenchymal echogenicity.  IVC: No abnormality visualized, poorly demonstrated.  Pancreas: Not visualized.  Spleen: Size and appearance within normal limits.  Right Kidney: Length: 10.6 cm. Echogenicity within normal limits. No mass or hydronephrosis visualized.  Left Kidney: Length: 11.1 cm. Echogenicity within normal limits. No mass or hydronephrosis visualized.  Abdominal aorta: No aneurysm visualized. Mid and distal aorta are obscured.  Other findings: None. No ascites. Technically difficult and limited exam due to body habitus and difficulty with breath hold technique.  IMPRESSION: 1. Cholelithiasis without sonographic findings of acute cholecystitis. 2. Hepatic steatosis. 3. Midline structures including the pancreas are obscured.   Electronically Signed   By: Rubye Oaks M.D.   On: 11/22/2014 00:37    2D ECHO:   Disposition and Follow-up: Discharge Instructions    Diet Carb Modified    Complete by:  As directed      Discharge instructions    Complete by:  As directed   Please slowly advance diet over the weekend/Monday to soft solids as tolerated.     Increase activity slowly    Complete by:  As directed             DISPOSITION: Home with home health PT, OT, home health RN, aide   DISCHARGE FOLLOW-UP Follow-up Information    Follow up with  Dorrene German, MD.   Specialty:  Internal Medicine   Why:  for hospital follow-up; APPOINTMENT: Wednesday, 12-08-14 @ 3:15pm @ [address] Office; ADDRESS: [address], [place], [place]; PHONE #:  [phone]; BRING: Insurance Card & Photo ID    Contact information:   [address] [place] [address] [phone]        Time spent on Discharge: 35 minutes  Signed:   RAI,RIPUDEEP M.D. [facility] 11/25/2014, 11:02 AM Pager: [phone]   Coding query  Patient does not appears to have sepsis, sepsis ruled out. Lactic acid 1.5, pro calcitonin less than 0.1.   RAI,RIPUDEEP M.D. [facility] 12/09/2014, 5:11 PM  Pager: [phone]

## 2014-11-26 LAB — CULTURE, BLOOD (ROUTINE X 2)
CULTURE: NO GROWTH
CULTURE: NO GROWTH

## 2014-11-29 ENCOUNTER — Ambulatory Visit: Payer: Medicare Other

## 2014-12-01 ENCOUNTER — Encounter: Payer: Self-pay | Admitting: Podiatry

## 2014-12-01 ENCOUNTER — Ambulatory Visit (INDEPENDENT_AMBULATORY_CARE_PROVIDER_SITE_OTHER): Payer: Medicare Other | Admitting: Podiatry

## 2014-12-01 DIAGNOSIS — L84 Corns and callosities: Secondary | ICD-10-CM | POA: Diagnosis not present

## 2014-12-01 DIAGNOSIS — M79673 Pain in unspecified foot: Secondary | ICD-10-CM | POA: Diagnosis not present

## 2014-12-01 DIAGNOSIS — B351 Tinea unguium: Secondary | ICD-10-CM

## 2014-12-01 DIAGNOSIS — E1140 Type 2 diabetes mellitus with diabetic neuropathy, unspecified: Secondary | ICD-10-CM | POA: Diagnosis not present

## 2014-12-01 NOTE — Patient Instructions (Signed)
Apply triple antibiotic ointment to skin fold on the bottom of the right great toe daily and cover with a Band-Aid until the skin reformed 6 callusDiabetes and Foot Care Diabetes may cause you to have problems because of poor blood supply (circulation) to your feet and legs. This may cause the skin on your feet to become thinner, break easier, and heal more slowly. Your skin may become dry, and the skin may peel and crack. You may also have nerve damage in your legs and feet causing decreased feeling in them. You may not notice minor injuries to your feet that could lead to infections or more serious problems. Taking care of your feet is one of the most important things you can do for yourself.  HOME CARE INSTRUCTIONS  Wear shoes at all times, even in the house. Do not go barefoot. Bare feet are easily injured.  Check your feet daily for blisters, cuts, and redness. If you cannot see the bottom of your feet, use a mirror or ask someone for help.  Wash your feet with warm water (do not use hot water) and mild soap. Then pat your feet and the areas between your toes until they are completely dry. Do not soak your feet as this can dry your skin.  Apply a moisturizing lotion or petroleum jelly (that does not contain alcohol and is unscented) to the skin on your feet and to dry, brittle toenails. Do not apply lotion between your toes.  Trim your toenails straight across. Do not dig under them or around the cuticle. File the edges of your nails with an emery board or nail file.  Do not cut corns or calluses or try to remove them with medicine.  Wear clean socks or stockings every day. Make sure they are not too tight. Do not wear knee-high stockings since they may decrease blood flow to your legs.  Wear shoes that fit properly and have enough cushioning. To break in new shoes, wear them for just a few hours a day. This prevents you from injuring your feet. Always look in your shoes before you put them on  to be sure there are no objects inside.  Do not cross your legs. This may decrease the blood flow to your feet.  If you find a minor scrape, cut, or break in the skin on your feet, keep it and the skin around it clean and dry. These areas may be cleansed with mild soap and water. Do not cleanse the area with peroxide, alcohol, or iodine.  When you remove an adhesive bandage, be sure not to damage the skin around it.  If you have a wound, look at it several times a day to make sure it is healing.  Do not use heating pads or hot water bottles. They may burn your skin. If you have lost feeling in your feet or legs, you may not know it is happening until it is too late.  Make sure your health care provider performs a complete foot exam at least annually or more often if you have foot problems. Report any cuts, sores, or bruises to your health care provider immediately. SEEK MEDICAL CARE IF:   You have an injury that is not healing.  You have cuts or breaks in the skin.  You have an ingrown nail.  You notice redness on your legs or feet.  You feel burning or tingling in your legs or feet.  You have pain or cramps in your legs  and feet.  Your legs or feet are numb.  Your feet always feel cold. SEEK IMMEDIATE MEDICAL CARE IF:   There is increasing redness, swelling, or pain in or around a wound.  There is a red line that goes up your leg.  Pus is coming from a wound.  You develop a fever or as directed by your health care provider.  You notice a bad smell coming from an ulcer or wound.   This information is not intended to replace advice given to you by your health care provider. Make sure you discuss any questions you have with your health care provider.   Document Released: 02/10/2000 Document Revised: 10/15/2012 Document Reviewed: 07/22/2012 Elsevier Interactive Patient Education [employer].

## 2014-12-02 ENCOUNTER — Ambulatory Visit: Payer: Medicare Other

## 2014-12-02 NOTE — Progress Notes (Signed)
Patient ID: Jodi Chavez, female   DOB: 07/05/1950, 64 y.o.   MRN: [id number]  Subjective: This patient presents for a scheduled visit complaining of painful toenails and keratoses and request debridement  Objective: Toenails 8 are incurvated, elongated, brittle and tender to direct palpation Keratoses medial plantar first MPJ right No open skin lesions bilaterally  Assessment: Mycotic toenails 8 History of diabetic peripheral neuropathy Keratoses 1  Plan: Debridement toenails 8 mechanically an electrical without any bleeding Debride plantar keratoses right first MPJ with slight bleeding treated with triple antibiotic ointment and Band-Aid Patient advised to continue applying triple antibiotic ointment and Band-Aid daily until a scab forms  Reappoint 3 months

## 2014-12-07 ENCOUNTER — Encounter: Payer: Medicare Other | Admitting: Physical Therapy

## 2015-03-09 ENCOUNTER — Ambulatory Visit: Payer: Medicare Other | Admitting: Podiatry

## 2015-03-30 ENCOUNTER — Encounter: Payer: Self-pay | Admitting: Podiatry

## 2015-03-30 ENCOUNTER — Ambulatory Visit (INDEPENDENT_AMBULATORY_CARE_PROVIDER_SITE_OTHER): Payer: Medicare Other | Admitting: Podiatry

## 2015-03-30 DIAGNOSIS — L84 Corns and callosities: Secondary | ICD-10-CM | POA: Diagnosis not present

## 2015-03-30 DIAGNOSIS — E1140 Type 2 diabetes mellitus with diabetic neuropathy, unspecified: Secondary | ICD-10-CM

## 2015-03-30 DIAGNOSIS — M79673 Pain in unspecified foot: Secondary | ICD-10-CM | POA: Diagnosis not present

## 2015-03-30 DIAGNOSIS — B351 Tinea unguium: Secondary | ICD-10-CM | POA: Diagnosis not present

## 2015-03-30 NOTE — Patient Instructions (Signed)
There was a small amount of bleeding after trimming the callus on the right foot. Topical antibiotic ointment and Band-Aid was applied today. Continue to apply topical antibiotic ointment such as triple antibiotic ointment, Neosporin to the callused areas and cover with a Band-Aid daily until a scab forms   Diabetes and Foot Care Diabetes may cause you to have problems because of poor blood supply (circulation) to your feet and legs. This may cause the skin on your feet to become thinner, break easier, and heal more slowly. Your skin may become dry, and the skin may peel and crack. You may also have nerve damage in your legs and feet causing decreased feeling in them. You may not notice minor injuries to your feet that could lead to infections or more serious problems. Taking care of your feet is one of the most important things you can do for yourself.  HOME CARE INSTRUCTIONS  Wear shoes at all times, even in the house. Do not go barefoot. Bare feet are easily injured.  Check your feet daily for blisters, cuts, and redness. If you cannot see the bottom of your feet, use a mirror or ask someone for help.  Wash your feet with warm water (do not use hot water) and mild soap. Then pat your feet and the areas between your toes until they are completely dry. Do not soak your feet as this can dry your skin.  Apply a moisturizing lotion or petroleum jelly (that does not contain alcohol and is unscented) to the skin on your feet and to dry, brittle toenails. Do not apply lotion between your toes.  Trim your toenails straight across. Do not dig under them or around the cuticle. File the edges of your nails with an emery board or nail file.  Do not cut corns or calluses or try to remove them with medicine.  Wear clean socks or stockings every day. Make sure they are not too tight. Do not wear knee-high stockings since they may decrease blood flow to your legs.  Wear shoes that fit properly and have enough  cushioning. To break in new shoes, wear them for just a few hours a day. This prevents you from injuring your feet. Always look in your shoes before you put them on to be sure there are no objects inside.  Do not cross your legs. This may decrease the blood flow to your feet.  If you find a minor scrape, cut, or break in the skin on your feet, keep it and the skin around it clean and dry. These areas may be cleansed with mild soap and water. Do not cleanse the area with peroxide, alcohol, or iodine.  When you remove an adhesive bandage, be sure not to damage the skin around it.  If you have a wound, look at it several times a day to make sure it is healing.  Do not use heating pads or hot water bottles. They may burn your skin. If you have lost feeling in your feet or legs, you may not know it is happening until it is too late.  Make sure your health care provider performs a complete foot exam at least annually or more often if you have foot problems. Report any cuts, sores, or bruises to your health care provider immediately. SEEK MEDICAL CARE IF:   You have an injury that is not healing.  You have cuts or breaks in the skin.  You have an ingrown nail.  You notice redness  on your legs or feet.  You feel burning or tingling in your legs or feet.  You have pain or cramps in your legs and feet.  Your legs or feet are numb.  Your feet always feel cold. SEEK IMMEDIATE MEDICAL CARE IF:   There is increasing redness, swelling, or pain in or around a wound.  There is a red line that goes up your leg.  Pus is coming from a wound.  You develop a fever or as directed by your health care provider.  You notice a bad smell coming from an ulcer or wound.   This information is not intended to replace advice given to you by your health care provider. Make sure you discuss any questions you have with your health care provider.   Document Released: 02/10/2000 Document Revised: 10/15/2012  Document Reviewed: 07/22/2012 Elsevier Interactive Patient Education [employer]

## 2015-03-31 NOTE — Progress Notes (Signed)
Patient ID: Jodi Chavez, female   DOB: 1950/04/24, 65 y.o.   MRN: [id number]  Subjective: This patient presents today complaining of toenails that are thickened and elongated which or cough walking wearing shoes and requests nail debridement. Also patient complaining of a uncomfortable callus on the plantar right foot  Objective: Orientated 3 No open skin lesions bilaterally Toenails 8 are elongated, hypertrophic, discolored and tender direct palpation Keratoses medial plantar first MPJ right  Assessment: Symptomatic onychomycose times a day History of peripheral neuropathy associated with diabetes Keratoses 1  Plan: Debridement toenails 8 mechanically an electrical without a bleeding Debride medial plantar keratoses right slight bleeding treated with antibiotic ointment and Band-Aid Patient advised to remove Band-Aid 1-2 days and to continue applying triple antibiotic ointment and Band-Aid daily until a scab forms  Reappoint at three-month

## 2015-06-15 ENCOUNTER — Other Ambulatory Visit: Payer: Self-pay | Admitting: Internal Medicine

## 2015-06-15 DIAGNOSIS — Z1231 Encounter for screening mammogram for malignant neoplasm of breast: Secondary | ICD-10-CM

## 2015-06-23 DIAGNOSIS — M162 Bilateral osteoarthritis resulting from hip dysplasia: Secondary | ICD-10-CM | POA: Insufficient documentation

## 2015-07-01 ENCOUNTER — Ambulatory Visit
Admission: RE | Admit: 2015-07-01 | Discharge: 2015-07-01 | Disposition: A | Discharge status: Home / Self Care | Payer: Medicare Other | Source: Ambulatory Visit | Attending: Internal Medicine | Admitting: Internal Medicine

## 2015-07-01 DIAGNOSIS — Z1231 Encounter for screening mammogram for malignant neoplasm of breast: Secondary | ICD-10-CM

## 2015-07-01 DIAGNOSIS — E2839 Other primary ovarian failure: Secondary | ICD-10-CM

## 2015-07-06 ENCOUNTER — Encounter: Payer: Self-pay | Admitting: Podiatry

## 2015-07-06 ENCOUNTER — Ambulatory Visit (INDEPENDENT_AMBULATORY_CARE_PROVIDER_SITE_OTHER): Payer: Medicare Other | Admitting: Podiatry

## 2015-07-06 DIAGNOSIS — M79676 Pain in unspecified toe(s): Secondary | ICD-10-CM | POA: Diagnosis not present

## 2015-07-06 DIAGNOSIS — L84 Corns and callosities: Secondary | ICD-10-CM | POA: Diagnosis not present

## 2015-07-06 DIAGNOSIS — E1140 Type 2 diabetes mellitus with diabetic neuropathy, unspecified: Secondary | ICD-10-CM | POA: Diagnosis not present

## 2015-07-06 DIAGNOSIS — B351 Tinea unguium: Secondary | ICD-10-CM

## 2015-07-06 NOTE — Patient Instructions (Signed)
Diabetes and Foot Care Diabetes may cause you to have problems because of poor blood supply (circulation) to your feet and legs. This may cause the skin on your feet to become thinner, break easier, and heal more slowly. Your skin may become dry, and the skin may peel and crack. You may also have nerve damage in your legs and feet causing decreased feeling in them. You may not notice minor injuries to your feet that could lead to infections or more serious problems. Taking care of your feet is one of the most important things you can do for yourself.  HOME CARE INSTRUCTIONS  Wear shoes at all times, even in the house. Do not go barefoot. Bare feet are easily injured.  Check your feet daily for blisters, cuts, and redness. If you cannot see the bottom of your feet, use a mirror or ask someone for help.  Wash your feet with warm water (do not use hot water) and mild soap. Then pat your feet and the areas between your toes until they are completely dry. Do not soak your feet as this can dry your skin.  Apply a moisturizing lotion or petroleum jelly (that does not contain alcohol and is unscented) to the skin on your feet and to dry, brittle toenails. Do not apply lotion between your toes.  Trim your toenails straight across. Do not dig under them or around the cuticle. File the edges of your nails with an emery board or nail file.  Do not cut corns or calluses or try to remove them with medicine.  Wear clean socks or stockings every day. Make sure they are not too tight. Do not wear knee-high stockings since they may decrease blood flow to your legs.  Wear shoes that fit properly and have enough cushioning. To break in new shoes, wear them for just a few hours a day. This prevents you from injuring your feet. Always look in your shoes before you put them on to be sure there are no objects inside.  Do not cross your legs. This may decrease the blood flow to your feet.  If you find a minor scrape,  cut, or break in the skin on your feet, keep it and the skin around it clean and dry. These areas may be cleansed with mild soap and water. Do not cleanse the area with peroxide, alcohol, or iodine.  When you remove an adhesive bandage, be sure not to damage the skin around it.  If you have a wound, look at it several times a day to make sure it is healing.  Do not use heating pads or hot water bottles. They may burn your skin. If you have lost feeling in your feet or legs, you may not know it is happening until it is too late.  Make sure your health care provider performs a complete foot exam at least annually or more often if you have foot problems. Report any cuts, sores, or bruises to your health care provider immediately. SEEK MEDICAL CARE IF:   You have an injury that is not healing.  You have cuts or breaks in the skin.  You have an ingrown nail.  You notice redness on your legs or feet.  You feel burning or tingling in your legs or feet.  You have pain or cramps in your legs and feet.  Your legs or feet are numb.  Your feet always feel cold. SEEK IMMEDIATE MEDICAL CARE IF:   There is increasing redness,   swelling, or pain in or around a wound.  There is a red line that goes up your leg.  Pus is coming from a wound.  You develop a fever or as directed by your health care provider.  You notice a bad smell coming from an ulcer or wound.   This information is not intended to replace advice given to you by your health care provider. Make sure you discuss any questions you have with your health care provider.   Document Released: 02/10/2000 Document Revised: 10/15/2012 Document Reviewed: 07/22/2012 Elsevier Interactive Patient Education 2016 [employer] Inc.  

## 2015-07-07 NOTE — Progress Notes (Signed)
Patient ID: Jodi JarvisDorcas P Chagoya, female   DOB: 1950/08/06, 65 y.o.   MRN: [id number]   Subjective: This patient presents today complaining of toenails that are thickened and elongated which or cough walking wearing shoes and requests nail debridement. Also patient complaining of a uncomfortable callus on the plantar right foot  Objective: Orientated 3 No open skin lesions bilaterally Toenails 8 are elongated, hypertrophic, discolored and tender direct palpation Keratoses medial plantar first MPJ right  Assessment: Symptomatic onychomycose times a day History of peripheral neuropathy associated with diabetes Keratoses 1  Plan: Debridement toenails 8 mechanically an electrical without any bleeding By keratoses 1 without any bleeding  Reappoint 3 months

## 2015-07-12 ENCOUNTER — Encounter: Payer: Medicare Other | Attending: Internal Medicine | Admitting: Dietician

## 2015-07-12 ENCOUNTER — Encounter: Payer: Self-pay | Admitting: Dietician

## 2015-07-12 VITALS — Ht 61.0 in | Wt 226.0 lb

## 2015-07-12 DIAGNOSIS — E119 Type 2 diabetes mellitus without complications: Secondary | ICD-10-CM

## 2015-07-12 NOTE — Patient Instructions (Signed)
Try sugar free jello or sugar free popsickles rather than ice cream for a snack. When you have ice cream keep the serving size to a half cup. Consider reading labels to know what a serving size is. Before you have a snack ask am I hungry or eating for another reason. Aim for 2 servings of carbohydrates (30 grams) per meal. Aim for 1 serving of carbohydrate for each snack. Have a protein choice each time you eat.  (1 ounce for a snack or 3 ounces for a meal)

## 2015-07-12 NOTE — Progress Notes (Signed)
  Medical Nutrition Therapy:  Appt start time: 0930 end time:  1030.   Assessment:  Primary concerns today: Patient is here alone.  Her doctor wants her to lose 30 lbs before she can have hip surgery.  Hx includes type 2 diabetes since 2000, hyperlipidemia, GERD and HTN.  HgbA1C of 7.0% 11/22/14 and reduced to 6.6% in May 2017.  Weight 226 lbs.  Patient lives alone usually but is living with her sister due to orthopedic problems currently.  She is awaiting hip surgery and hopes to be more independent after this.  Her sister is currently doing the shopping and cooking.  Her sister has heart disease and her brother in law has Alzheimer's disease.  Preferred Learning Style:   No preference indicated   Learning Readiness:   Ready  MEDICATIONS: see list to include amaryl and metformin   DIETARY INTAKE:  Usual eating pattern includes 2 meals and 2 snacks per day.  Everyday foods include sugar free cookies and sugar free ice cream.  Avoided foods include Most non starchy vegetables.    24-hr recall:  B (11-12 AM):  [employer] toast, peanut butter and jelly, and applesauce  Snk ( AM): none  L ( PM): none Snk ( PM):  No added sugar popsickle  D ( PM):   2 chicken nuggets, mashed potatoes, lima beans OR meatloaf, mashed potatoes, vegetables Snk ( PM):  2-3 scoops ice cream or sugar free cookies or NABS Beverages: diet 7up, water, occasionally sweet tea or unsweetened with sweet and low.  Usual physical activity: ADL's.  Patient uses a walker.  Estimated energy needs: 1200 calories 135 g carbohydrates 90 g protein 33 g fat  Progress Towards Goal(s):  In progress.   Nutritional Diagnosis:  NB-1.1 Food and nutrition-related knowledge deficit As related to balance of carbohydrate, protein, and fat.  As evidenced by diet hx and patient report.    Intervention:  Nutrition counseling/education for weight reduction.  Discussed benefits of increased vegetable intake, whole grains rather than high  fat bread, decreased portions of sugar free cookies and ice cream.  Discussed portion size using food models.  Try sugar free jello or sugar free popsickles rather than ice cream for a snack. When you have ice cream keep the serving size to a half cup. Consider reading labels to know what a serving size is. Before you have a snack ask am I hungry or eating for another reason. Aim for 2 servings of carbohydrates (30 grams) per meal. Aim for 1 serving of carbohydrate for each snack. Have a protein choice each time you eat.  (1 ounce for a snack or 3 ounces for a meal)   Teaching Method Utilized:  Visual Auditory Hands on  Handouts given during visit include:  My plate  Meal plan card  Snack list  Barriers to learning/adherence to lifestyle change: decreased ability to exercise, dislike of many foods, preferences  Demonstrated degree of understanding via:  Teach Back   Monitoring/Evaluation:  Dietary intake, exercise, and body weight in 1 month(s).

## 2015-07-18 ENCOUNTER — Ambulatory Visit: Payer: Medicare Other | Attending: Internal Medicine | Admitting: Rehabilitative and Restorative Service Providers"

## 2015-07-18 DIAGNOSIS — R269 Unspecified abnormalities of gait and mobility: Secondary | ICD-10-CM | POA: Insufficient documentation

## 2015-07-18 DIAGNOSIS — M6281 Muscle weakness (generalized): Secondary | ICD-10-CM | POA: Insufficient documentation

## 2015-07-18 DIAGNOSIS — R293 Abnormal posture: Secondary | ICD-10-CM | POA: Diagnosis present

## 2015-07-18 DIAGNOSIS — G8929 Other chronic pain: Secondary | ICD-10-CM | POA: Insufficient documentation

## 2015-07-18 DIAGNOSIS — M25552 Pain in left hip: Secondary | ICD-10-CM | POA: Insufficient documentation

## 2015-07-18 DIAGNOSIS — M25562 Pain in left knee: Secondary | ICD-10-CM | POA: Diagnosis present

## 2015-07-18 DIAGNOSIS — R2689 Other abnormalities of gait and mobility: Secondary | ICD-10-CM

## 2015-07-19 ENCOUNTER — Ambulatory Visit: Payer: Medicare Other | Admitting: Rehabilitative and Restorative Service Providers"

## 2015-07-19 DIAGNOSIS — M6281 Muscle weakness (generalized): Secondary | ICD-10-CM | POA: Diagnosis not present

## 2015-07-19 DIAGNOSIS — M25562 Pain in left knee: Secondary | ICD-10-CM

## 2015-07-19 DIAGNOSIS — R2689 Other abnormalities of gait and mobility: Secondary | ICD-10-CM

## 2015-07-19 NOTE — Therapy (Signed)
[facility] [address] [place], [place], [address] Phone: [phone]   Fax:  [phone]  Physical Therapy Evaluation  Patient Details  Name: Jodi Chavez MRN: [id number] Date of Birth: 02-24-1951 Referring Provider: Fleet Contras, MD  Encounter Date: 07/18/2015      PT End of Session - 07/18/15 1150    Visit Number 1   Number of Visits 16   Date for PT Re-Evaluation 09/17/15   Authorization Type G code every 10th visit   PT Start Time 1235   PT Stop Time 1320   PT Time Calculation (min) 45 min   Equipment Utilized During Treatment Gait belt   Activity Tolerance Patient tolerated treatment well   Behavior During Therapy [facility] for tasks assessed/performed      Past Medical History  Diagnosis Date  . PONV (postoperative nausea and vomiting)   . Hypertension     takes Benazepril nightly and Propranolol tid and Clonidine daily  . Hyperlipidemia     takes Zetia and Zocor daily  . History of bronchitis     last time several yrs ago  . History of migraine     many yrs ago  . Dizziness     occasionally and related to meds   . Peripheral edema   . Peripheral neuropathy (HCC)   . Arthritis     hip  . Low back pain   . Constipation     uses laxatives several times a week  . Slow urinary stream     occasionally  . Diabetes mellitus     takes Metformin and Amaryl daily  . Early cataracts, bilateral   . Depression   . Insomnia     Past Surgical History  Procedure Laterality Date  . 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 D&c/hysteroscopy/ablation    . Dilation and curettage of uterus      couple of times  . Colonoscopy    . Esophagogastroduodenoscopy    . Posterior cervical fusion/foraminotomy  10/15/2011    Procedure: POSTERIOR CERVICAL FUSION/FORAMINOTOMY LEVEL 2;  Surgeon: Kerrin Champagne, MD;  Location: [facility] OR;  Service: Orthopedics;  Laterality: N/A;  Right C6-7, C7-T1  Foraminotomy with excision HNP C7-T1  . Upper gastrointestinal endoscopy      There were no vitals filed for this visit.       Subjective Assessment - 07/18/15 1242    Subjective The patient reports that she is working on losing weight in order ot get a left hip replacement.  She reports she has severe L knee pain and some L hip pain.  The patient reports that she is working on losing 30 pounds prior to surgery.  She is hoping to get some exercises to help you stronger in preparation for surgery.   Patient Stated Goals Manage pain, exercises for home for strengthening.   Currently in Pain? Yes   Pain Score 8    Pain Location Knee   Pain Orientation Left   Pain Descriptors / Indicators Aching   Pain Type Chronic pain   Pain Onset More than a month ago   Pain Frequency Constant   Aggravating Factors  weight bearing and walking   Pain Relieving Factors sitting down   Multiple Pain Sites No            [facility] PT Assessment - 07/18/15 1245    Assessment   Medical Diagnosis L hip arthritis   Referring Provider  Fleet Contras, MD   Onset Date/Surgical Date --  06/2014   Prior Therapy prior PT at [facility] [address]   Precautions   Precautions Fall   Balance Screen   Has the patient fallen in the past 6 months Yes   How many times? 2   Has the patient had a decrease in activity level because of a fear of falling?  No   Is the patient reluctant to leave their home because of a fear of falling?  No   Home Environment   Living Environment Private residence   Living Arrangements Other relatives  sister   Type of Home House  currently staying at sister's house due to immobility   Home Access Stairs to enter   Entrance Stairs-Number of Steps 4-5   Entrance Stairs-Rails Right  Has help to negotiate   Home Layout One level   Home Equipment Walker - 2 wheels;Bedside commode;Cane - single point;Tub bench  avoids showers due to immobility   Additional Comments Sister is helping with all  ADLs   Prior Function   Level of Independence Needs assistance with ADLs;Needs assistance with gait   Sensation   Additional Comments Patient reports neuropathy in feet with occasional pins/needles sensation   Posture/Postural Control   Posture/Postural Control Postural limitations   Postural Limitations Rounded Shoulders;Forward head   ROM / Strength   AROM / PROM / Strength AROM;Strength   AROM   Overall AROM Comments UEs WFLs for overhead reaching with patient holdinng breath.   A/ROM in bilateral hips reduced due to weakness.  Patient with h/o R hip surgery when 65 years old.  Patient has significant tightness in bilateral hip adductors and has pain with gentle ROM into hip ab/adduction.  ROM appears to be hindered by increased muscle tone.  Unsure of origin of tone from PMH review.     Strength   Overall Strength Comments bilateral UEs 4/5.  2/5 bilateral hip flexion, 3/5 bilateral knee extension, 3/5 bilateral knee flexion, ankle DF is 3/5   Palpation   Palpation comment tender along L knee IT band, patella is laterally positioned with pain with gentle palpation, pain in bilateral hip adductors with gentle plapation.     Bed Mobility   Bed Mobility --  Uses a wedge in bed due to shortness of breath   Transfers   Transfers Sit to Stand;Stand to Sit;Supine to Sit;Sit to Supine   Sit to Stand 4: Min guard   Supine to Sit 4: Min assist   Sit to Supine 4: Min assist   Ambulation/Gait   Ambulation/Gait Yes   Ambulation/Gait Assistance 4: Min guard   Ambulation Distance (Feet) --  75    Assistive device Rolling walker   Gait Pattern Decreased stride length;Trunk flexed;Narrow base of support  Patient leans heavily forward on RW   Ambulation Surface Level   Gait velocity 0.56 ft/sec   Stairs Yes   Stairs Assistance 4: Min assist   Stair Management Technique Two rails;Step to pattern   Number of Stairs 4             PT Education - 07/19/15 1149    Education provided Yes    Education Details goals of PT and safe mobility   Person(s) Educated Patient   Methods Explanation   Comprehension Verbalized understanding          PT Short Term Goals - 07/18/15 1150    PT SHORT TERM GOAL #1   Title The patient will  be indep with HEP using written insturctions for cues.   Baseline Target date:  08/17/2015   Time 4   Period Weeks   PT SHORT TERM GOAL #2   Title The patient will improve sit>stand transfers to be independent moving sit>RW x 5/5 repetitions with UE support and no assist or loss of balance.   Baseline Target date 08/17/2015   Time 4   Period Weeks   PT SHORT TERM GOAL #3   Title The patient will improve walking speed from 0.56 ft/sec to > or equal to 1.0 ft/sec to demo improving functional ambulation.   Baseline Target date 6/21/20176   Time 4   Period Weeks   PT SHORT TERM GOAL #4   Title The patient will move sit<>supine with supervision to demo improved functional mobility.   Baseline Target date 08/17/2015   Time 4   Period Weeks           PT Long Term Goals - 08-18-15 1156    PT LONG TERM GOAL #1   Title The patient will be indep with HEP progression.   Baseline Target date 09/16/2015   Time 8   Period Weeks   PT LONG TERM GOAL #2   Title The patient will negotiate 4 steps with one handrail x supervision for improved household surface negotiation.   Baseline Target date 09/16/2015   Time 8   Period Weeks   PT LONG TERM GOAL #3   Title The patient will improve gait speed from 0.56 ft/sec to > or equal to 1.3 ft/sec to demo transition from safe "household" ambulator to "limited community" ambulator.   Baseline Target date 09/16/2015   Time 8   Period Weeks   PT LONG TERM GOAL #4   Title The paitent will report knee pain reduced from 8/10 to 4/10 for improved tolerance to mobility.   Baseline Target date 09/16/2015   Time 8   Period Weeks   PT LONG TERM GOAL #5   Title The patient will ambulate x 250 feet nonstop to demo improved  tolerance to ambulation.   Baseline Target date 09/16/2015   Time 8   Period Weeks               Plan - Aug 18, 2015 1202    Clinical Impression Statement The patient is a 65 yo female referred to [facility] PT with hip arthritis, knee pain (no hip pain), significantly reduced mobility, unsafe ambulation with h/o falls, decreased flexibility and weakness.  PT to address deficits to optimize current functional status.   Rehab Potential Good   PT Frequency 2x / week   PT Duration 8 weeks   PT Treatment/Interventions ADLs/Self Care Home Management;Manual techniques;Therapeutic exercise;Therapeutic activities;Moist Heat;Cryotherapy;Neuromuscular re-education;Gait training;Stair training;DME Instruction;Functional mobility training;Patient/family education   PT Next Visit Plan HEP instruction: stretching hip adductors, quad strengthening, hip abductor strengthening, posture, balance   Consulted and Agree with Plan of Care Patient      Patient will benefit from skilled therapeutic intervention in order to improve the following deficits and impairments:  Abnormal gait, Difficulty walking, Decreased activity tolerance, Decreased balance, Decreased mobility, Decreased strength, Postural dysfunction, Impaired flexibility, Pain, Decreased endurance  Visit Diagnosis: Muscle weakness (generalized)  Other abnormalities of gait and mobility  Pain in left knee  Pain in left hip      G-Codes - 2015/08/18 1204    Functional Assessment Tool Used Gait speed=0.56 ft/sec, CGA for sit<>stand, pain 8/10 L knee   Functional Limitation Mobility: Walking  and moving around   Mobility: Walking and Moving Around Current Status [phone]([id number]) At least 40 percent but less than 60 percent impaired, limited or restricted   Mobility: Walking and Moving Around Goal Status [phone]([id number]) At least 20 percent but less than 40 percent impaired, limited or restricted       Problem List Patient Active Problem List   Diagnosis Date Noted   . HLD (hyperlipidemia) 11/21/2014  . Depression 11/21/2014  . Nausea & vomiting 11/21/2014  . Sepsis (HCC) 11/21/2014  . Coughing 11/21/2014  . UTI (lower urinary tract infection) 11/21/2014  . AKI (acute kidney injury) (HCC) 11/21/2014  . Nausea with vomiting   . 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 HNP (herniated nucleus pulposus), cervical 10/15/2011    Class: Chronic  . Diabetes mellitus without complication (HCC) 09/30/2006  . Essential hypertension 09/30/2006  . DILATION AND CURETTAGE, HX OF 09/30/2006    Greig Altergott, PT 07/19/2015, 12:05 PM  [facility] [address] [place], [place], [address] Phone: [phone]   Fax:  [phone]  Name: Jodi Chavez MRN: [id number] Date of Birth: 23-Oct-1950

## 2015-07-19 NOTE — Patient Instructions (Signed)
KNEE: Extension, Long Arc Quads - Sitting    Raise leg until knee is straight.  Hold 3 seconds. _10__ reps per set, __2_ sets per day. Copyright  VHI. All rights reserved.   ABDUCTION: Sitting (Active)    Sit with feet flat. Lift right leg slightly and draw it out to side. No weights for now. Complete _1__ sets of _8-10__ repetitions. Perform _2__ sessions per day.  Copyright  VHI. All rights reserved.   Heel Raise: Bilateral (Standing)    HOLD ONTO WALKER IN STANDING.  Rise on balls of feet. Repeat __10__ times per set. Do __1__ sets per session. Do __2__ sessions per day.  http://orth.exer.us/39   Copyright  VHI. All rights reserved.  Hamstring Stretch (Sitting)    Sitting, extend one leg and place hands on same thigh for support. Keeping torso straight, lean forward, sliding hands down leg, until a stretch is felt in back of thigh. Hold __30__ seconds. Repeat with other leg.  Copyright  VHI. All rights reserved.   High Stepping in Place (Sitting)    Sitting, alternately lift knees as high as possible. Keep torso erect. Repeat __5-10__ times, each leg.  Copyright  VHI. All rights reserved.   

## 2015-07-19 NOTE — Therapy (Signed)
[facility] [address] [place], [place], [address] Phone: [phone]   Fax:  [phone]  Physical Therapy Treatment  Patient Details  Name: Jodi JarvisDorcas P Nicholls MRN: [id number] Date of Birth: 1951-01-29 Referring Provider: Fleet ContrasEdwin Avbuere, MD  Encounter Date: 07/19/2015      PT End of Session - 07/19/15 1323    Visit Number 2   Number of Visits 16   Date for PT Re-Evaluation 09/17/15   Authorization Type G code every 10th visit   PT Start Time 1234   PT Stop Time 1320   PT Time Calculation (min) 46 min   Equipment Utilized During Treatment Gait belt   Activity Tolerance Patient tolerated treatment well   Behavior During Therapy [facility] for tasks assessed/performed      Past Medical History  Diagnosis Date  . PONV (postoperative nausea and vomiting)   . Hypertension     takes Benazepril nightly and Propranolol tid and Clonidine daily  . Hyperlipidemia     takes Zetia and Zocor daily  . History of bronchitis     last time several yrs ago  . History of migraine     many yrs ago  . Dizziness     occasionally and related to meds   . Peripheral edema   . Peripheral neuropathy (HCC)   . Arthritis     hip  . Low back pain   . Constipation     uses laxatives several times a week  . Slow urinary stream     occasionally  . Diabetes mellitus     takes Metformin and Amaryl daily  . Early cataracts, bilateral   . Depression   . Insomnia     Past Surgical History  Procedure Laterality Date  . 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 D&c/hysteroscopy/ablation    . Dilation and curettage of uterus      couple of times  . Colonoscopy    . Esophagogastroduodenoscopy    . Posterior cervical fusion/foraminotomy  10/15/2011    Procedure: POSTERIOR CERVICAL FUSION/FORAMINOTOMY LEVEL 2;  Surgeon: Kerrin ChampagneJames E Nitka, MD;  Location: [facility] OR;  Service: Orthopedics;  Laterality: N/A;  Right C6-7, C7-T1  Foraminotomy with excision HNP C7-T1  . Upper gastrointestinal endoscopy      There were no vitals filed for this visit.      Subjective Assessment - 07/19/15 1238    Subjective The patient reports that she is moving better today.     Patient Stated Goals Manage pain, exercises for home for strengthening.   Currently in Pain? Yes   Pain Score 2    Pain Location Knee   Pain Orientation Left   Pain Descriptors / Indicators Aching   Pain Type Chronic pain   Pain Onset More than a month ago   Pain Frequency Constant   Aggravating Factors  exercise and walking   Pain Relieving Factors medications      THERAPEUTIC EXERCISE: Seated long arc quads x 10 reps with 3 second holds Seated hip ab/adduction x 10 reps Seated stepping laterally and return to midline x 5 reps Seated hamstring stretching with cues x 2 reps each side Standing heel raises with UE support through RW Seated ankle DF Sit<>stand x 6 reps with gluteal set upon standing  Gait: Ambulation with cues on longer stride length with RW x CGA x 100 ft x 2 and 75 feet PT emphasized upright posture and reduced dependence on  RW for support  NEUROMUSCULAR RE-EDUCATION: Standing balance including posterior > anterior weight shift performing wall bumps reducing UE support Stnading balance reducing UE support          PT Education - 07/19/15 1311    Education provided Yes   Education Details HEP: long arc quad, hip abduction/adduction sitting, sitting marches, heel raises, hamstring stretch   Person(s) Educated Patient   Methods Explanation;Demonstration;Handout   Comprehension Verbalized understanding;Returned demonstration          PT Short Term Goals - 07/18/15 1150    PT SHORT TERM GOAL #1   Title The patient will be indep with HEP using written insturctions for cues.   Baseline Target date:  08/17/2015   Time 4   Period Weeks   PT SHORT TERM GOAL #2   Title The patient will improve sit>stand transfers to be  independent moving sit>RW x 5/5 repetitions with UE support and no assist or loss of balance.   Baseline Target date 08/17/2015   Time 4   Period Weeks   PT SHORT TERM GOAL #3   Title The patient will improve walking speed from 0.56 ft/sec to > or equal to 1.0 ft/sec to demo improving functional ambulation.   Baseline Target date 6/21/20176   Time 4   Period Weeks   PT SHORT TERM GOAL #4   Title The patient will move sit<>supine with supervision to demo improved functional mobility.   Baseline Target date 08/17/2015   Time 4   Period Weeks           PT Long Term Goals - 07/19/15 1156    PT LONG TERM GOAL #1   Title The patient will be indep with HEP progression.   Baseline Target date 09/16/2015   Time 8   Period Weeks   PT LONG TERM GOAL #2   Title The patient will negotiate 4 steps with one handrail x supervision for improved household surface negotiation.   Baseline Target date 09/16/2015   Time 8   Period Weeks   PT LONG TERM GOAL #3   Title The patient will improve gait speed from 0.56 ft/sec to > or equal to 1.3 ft/sec to demo transition from safe "household" ambulator to "limited community" ambulator.   Baseline Target date 09/16/2015   Time 8   Period Weeks   PT LONG TERM GOAL #4   Title The paitent will report knee pain reduced from 8/10 to 4/10 for improved tolerance to mobility.   Baseline Target date 09/16/2015   Time 8   Period Weeks   PT LONG TERM GOAL #5   Title The patient will ambulate x 250 feet nonstop to demo improved tolerance to ambulation.   Baseline Target date 09/16/2015   Time 8   Period Weeks               Plan - 07/19/15 1333    Clinical Impression Statement The patient tolerated seated exercises today.  PT did not perform supine activities as she sleeps in recliner chair and does not get into a bed.  PT to progress activities to patient tolerance.   PT Treatment/Interventions ADLs/Self Care Home Management;Manual techniques;Therapeutic  exercise;Therapeutic activities;Moist Heat;Cryotherapy;Neuromuscular re-education;Gait training;Stair training;DME Instruction;Functional mobility training;Patient/family education   PT Next Visit Plan Review HEP, ensure tolerating well.  Standing postural training reducing dependence on UEs fo rsupport, standing balance, exercises in parallel bars for LE strengthening.   Consulted and Agree with Plan of Care Patient  Patient will benefit from skilled therapeutic intervention in order to improve the following deficits and impairments:  Abnormal gait, Difficulty walking, Decreased activity tolerance, Decreased balance, Decreased mobility, Decreased strength, Postural dysfunction, Impaired flexibility, Pain, Decreased endurance  Visit Diagnosis: Muscle weakness (generalized)  Other abnormalities of gait and mobility  Pain in left knee       G-Codes - 07-21-15 1204    Functional Assessment Tool Used Gait speed=0.56 ft/sec, CGA for sit<>stand, pain 8/10 L knee   Functional Limitation Mobility: Walking and moving around   Mobility: Walking and Moving Around Current Status [phone]) At least 40 percent but less than 60 percent impaired, limited or restricted   Mobility: Walking and Moving Around Goal Status [phone]) At least 20 percent but less than 40 percent impaired, limited or restricted      Problem List Patient Active Problem List   Diagnosis Date Noted  . HLD (hyperlipidemia) 11/21/2014  . Depression 11/21/2014  . Nausea & vomiting 11/21/2014  . Sepsis (HCC) 11/21/2014  . Coughing 11/21/2014  . UTI (lower urinary tract infection) 11/21/2014  . AKI (acute kidney injury) (HCC) 11/21/2014  . Nausea with vomiting   . 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 HNP (herniated nucleus pulposus), cervical 10/15/2011    Class: Chronic  . Diabetes mellitus without complication (HCC) 09/30/2006  . Essential hypertension 09/30/2006  . DILATION AND  CURETTAGE, HX OF 09/30/2006    Aslan Montagna, PT 2015/07/21, 1:34 PM  [facility] [address] [place], [place], [address] Phone: [phone]   Fax:  [phone]  Name: DARYLE BOYINGTON MRN: [id number] Date of Birth: Jan 30, 1951

## 2015-07-27 ENCOUNTER — Encounter: Payer: Self-pay | Admitting: Physical Therapy

## 2015-07-27 ENCOUNTER — Ambulatory Visit: Payer: Medicare Other | Admitting: Physical Therapy

## 2015-07-27 DIAGNOSIS — M6281 Muscle weakness (generalized): Secondary | ICD-10-CM

## 2015-07-27 DIAGNOSIS — R293 Abnormal posture: Secondary | ICD-10-CM

## 2015-07-27 DIAGNOSIS — M25552 Pain in left hip: Secondary | ICD-10-CM

## 2015-07-27 DIAGNOSIS — R269 Unspecified abnormalities of gait and mobility: Secondary | ICD-10-CM

## 2015-07-27 DIAGNOSIS — G8929 Other chronic pain: Secondary | ICD-10-CM

## 2015-07-27 DIAGNOSIS — M25562 Pain in left knee: Secondary | ICD-10-CM

## 2015-07-27 DIAGNOSIS — R2689 Other abnormalities of gait and mobility: Secondary | ICD-10-CM

## 2015-07-27 NOTE — Patient Instructions (Addendum)
KNEE: Extension, Long Arc Quads - Sitting    Raise leg until knee is straight.  Hold 3 seconds. _10__ reps per set, __2_ sets per day. Copyright  VHI. All rights reserved.   ABDUCTION: Sitting (Active)    Sit with feet flat. Lift right leg slightly and draw it out to side. No weights for now. Complete _1__ sets of _8-10__ repetitions. Perform _2__ sessions per day.  Copyright  VHI. All rights reserved.   Heel Raise: Bilateral (Standing)    HOLD ONTO WALKER IN STANDING.  Rise on balls of feet. Repeat __10__ times per set. Do __1__ sets per session. Do __2__ sessions per day.  http://orth.exer.us/39   Copyright  VHI. All rights reserved.  Hamstring Stretch (Sitting)    Sitting, extend one leg and place hands on same thigh for support. Keeping torso straight, lean forward, sliding hands down leg, until a stretch is felt in back of thigh. Hold __30__ seconds. Repeat with other leg.  Copyright  VHI. All rights reserved.   High Stepping in Place (Sitting)    Sitting, alternately lift knees as high as possible. Keep torso erect. Repeat __5-10__ times, each leg.  Copyright  VHI. All rights reserved.  Adduction: Hip - Knees Together (Sitting)    Sit with towel roll between knees. Push knees together. Hold for _3__ seconds.  Repeat _10__ times. Do __2_ times a day.  Copyright  VHI. All rights reserved.

## 2015-07-27 NOTE — Therapy (Signed)
[facility] [address] [place], [place], [address] Phone: [phone]   Fax:  [phone]  Physical Therapy Treatment  Patient Details  Name: Jodi Chavez MRN: [id number] Date of Birth: November 26, 1950 Referring Provider: Fleet ContrasEdwin Avbuere, MD  Encounter Date: 07/27/2015      PT End of Session - 07/27/15 1016    Visit Number 3   Number of Visits 16   Date for PT Re-Evaluation 09/17/15   Authorization Type G code every 10th visit   PT Start Time 0935   PT Stop Time 1015   PT Time Calculation (min) 40 min   Equipment Utilized During Treatment Gait belt   Activity Tolerance Patient tolerated treatment well   Behavior During Therapy [facility] for tasks assessed/performed      Past Medical History  Diagnosis Date  . PONV (postoperative nausea and vomiting)   . Hypertension     takes Benazepril nightly and Propranolol tid and Clonidine daily  . Hyperlipidemia     takes Zetia and Zocor daily  . History of bronchitis     last time several yrs ago  . History of migraine     many yrs ago  . Dizziness     occasionally and related to meds   . Peripheral edema   . Peripheral neuropathy (HCC)   . Arthritis     hip  . Low back pain   . Constipation     uses laxatives several times a week  . Slow urinary stream     occasionally  . Diabetes mellitus     takes Metformin and Amaryl daily  . Early cataracts, bilateral   . Depression   . Insomnia     Past Surgical History  Procedure Laterality Date  . 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 D&c/hysteroscopy/ablation    . Dilation and curettage of uterus      couple of times  . Colonoscopy    . Esophagogastroduodenoscopy    . Posterior cervical fusion/foraminotomy  10/15/2011    Procedure: POSTERIOR CERVICAL FUSION/FORAMINOTOMY LEVEL 2;  Surgeon: Kerrin ChampagneJames E Nitka, MD;  Location: [facility] OR;  Service: Orthopedics;  Laterality: N/A;  Right C6-7, C7-T1  Foraminotomy with excision HNP C7-T1  . Upper gastrointestinal endoscopy      There were no vitals filed for this visit.      Subjective Assessment - 07/27/15 0940    Subjective Worked on HEP 2x/day.    Patient Stated Goals Manage pain, exercises for home for strengthening.   Currently in Pain? Yes   Pain Score 2   increased to 6/10 with exercise.   Pain Location Knee   Pain Orientation Left   Pain Descriptors / Indicators Aching   Pain Type Chronic pain   Pain Radiating Towards hip   Pain Onset More than a month ago   Pain Frequency Intermittent                         [facility] Adult PT Treatment/Exercise - 07/27/15 0001    Exercises   Exercises Knee/Hip;Ankle  Reviewed HEP given 07/19/15 10x ea. AROM see handout below.   Knee/Hip Exercises: Seated   Hip ADD Squeezes 20x     Gait: Discussed Posture and safe technique to keep walker close. Pt has difficulty maintaining upright posture and uses heavy UE support with gait. Pt demonstrates poor foot clearance.  60'x2.  PT Education - 07/27/15 0943    Education provided Yes   Education Details Reviewed HEP given 07/19/15; added seated hip add. squeezes   Person(s) Educated Patient   Methods Explanation;Demonstration;Verbal cues;Handout   Comprehension Verbalized understanding;Returned demonstration;Need further instruction          PT Short Term Goals - 07/18/15 1150    PT SHORT TERM GOAL #1   Title The patient will be indep with HEP using written insturctions for cues.   Baseline Target date:  08/17/2015   Time 4   Period Weeks   PT SHORT TERM GOAL #2   Title The patient will improve sit>stand transfers to be independent moving sit>RW x 5/5 repetitions with UE support and no assist or loss of balance.   Baseline Target date 08/17/2015   Time 4   Period Weeks   PT SHORT TERM GOAL #3   Title The patient will improve walking speed from 0.56 ft/sec to > or equal to 1.0 ft/sec to demo  improving functional ambulation.   Baseline Target date 6/21/20176   Time 4   Period Weeks   PT SHORT TERM GOAL #4   Title The patient will move sit<>supine with supervision to demo improved functional mobility.   Baseline Target date 08/17/2015   Time 4   Period Weeks           PT Long Term Goals - 07/19/15 1156    PT LONG TERM GOAL #1   Title The patient will be indep with HEP progression.   Baseline Target date 09/16/2015   Time 8   Period Weeks   PT LONG TERM GOAL #2   Title The patient will negotiate 4 steps with one handrail x supervision for improved household surface negotiation.   Baseline Target date 09/16/2015   Time 8   Period Weeks   PT LONG TERM GOAL #3   Title The patient will improve gait speed from 0.56 ft/sec to > or equal to 1.3 ft/sec to demo transition from safe "household" ambulator to "limited community" ambulator.   Baseline Target date 09/16/2015   Time 8   Period Weeks   PT LONG TERM GOAL #4   Title The paitent will report knee pain reduced from 8/10 to 4/10 for improved tolerance to mobility.   Baseline Target date 09/16/2015   Time 8   Period Weeks   PT LONG TERM GOAL #5   Title The patient will ambulate x 250 feet nonstop to demo improved tolerance to ambulation.   Baseline Target date 09/16/2015   Time 8   Period Weeks               Plan - 07/27/15 1002    Clinical Impression Statement Reviewed HEP given last visit; pt is performing consistently at home, has increase pain with performance which dissipates with rest per pt. Added Hip adduction stregthening to HEP.   Rehab Potential Good   PT Frequency 2x / week   PT Duration 8 weeks   PT Treatment/Interventions ADLs/Self Care Home Management;Manual techniques;Therapeutic exercise;Therapeutic activities;Moist Heat;Cryotherapy;Neuromuscular re-education;Gait training;Stair training;DME Instruction;Functional mobility training;Patient/family education   PT Next Visit Plan Review HEP, ensure  tolerating well.  Standing postural training reducing dependence on UEs fo rsupport, standing balance, exercises in parallel bars for LE strengthening.      Patient will benefit from skilled therapeutic intervention in order to improve the following deficits and impairments:  Abnormal gait, Difficulty walking, Decreased activity tolerance, Decreased balance, Decreased mobility, Decreased strength,  Postural dysfunction, Impaired flexibility, Pain, Decreased endurance  Visit Diagnosis: Muscle weakness (generalized)  Other abnormalities of gait and mobility  Pain in left knee  Pain in left hip  Knee pain, chronic, left  Posture abnormality  Abnormality of gait     Problem Chavez Patient Active Problem Chavez   Diagnosis Date Noted  . HLD (hyperlipidemia) 11/21/2014  . Depression 11/21/2014  . Nausea & vomiting 11/21/2014  . Sepsis (HCC) 11/21/2014  . Coughing 11/21/2014  . UTI (lower urinary tract infection) 11/21/2014  . AKI (acute kidney injury) (HCC) 11/21/2014  . Nausea with vomiting   . 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 HNP (herniated nucleus pulposus), cervical 10/15/2011    Class: Chronic  . Diabetes mellitus without complication (HCC) 09/30/2006  . Essential hypertension 09/30/2006  . DILATION AND CURETTAGE, HX OF 09/30/2006    Hortencia Conradi, PTA  07/27/2015, 2:43 PM  [facility] [address] [place], [place], [address] Phone: [phone]   Fax:  [phone]  Name: Jodi Chavez MRN: [id number] Date of Birth: [birth year]

## 2015-07-28 ENCOUNTER — Ambulatory Visit: Payer: Medicare Other | Attending: Internal Medicine | Admitting: Physical Therapy

## 2015-07-28 DIAGNOSIS — G8929 Other chronic pain: Secondary | ICD-10-CM

## 2015-07-28 DIAGNOSIS — R2689 Other abnormalities of gait and mobility: Secondary | ICD-10-CM

## 2015-07-28 DIAGNOSIS — M25562 Pain in left knee: Secondary | ICD-10-CM

## 2015-07-28 DIAGNOSIS — R293 Abnormal posture: Secondary | ICD-10-CM

## 2015-07-28 DIAGNOSIS — M6281 Muscle weakness (generalized): Secondary | ICD-10-CM

## 2015-07-28 DIAGNOSIS — R269 Unspecified abnormalities of gait and mobility: Secondary | ICD-10-CM

## 2015-07-28 DIAGNOSIS — M25552 Pain in left hip: Secondary | ICD-10-CM

## 2015-07-28 NOTE — Therapy (Signed)
[facility] [address] [place], [place], [address] Phone: [phone]   Fax:  [phone]  Physical Therapy Treatment  Patient Details  Name: Jodi JarvisDorcas P Chavez MRN: [id number] Date of Birth: [birth year] Referring Provider: Fleet ContrasEdwin Avbuere, MD  Encounter Date: 07/28/2015      PT End of Session - 07/28/15 1015    Visit Number 4   Number of Visits 16   Date for PT Re-Evaluation 09/17/15   Authorization Type G code every 10th visit   PT Start Time 0930   PT Stop Time 1015   PT Time Calculation (min) 45 min   Equipment Utilized During Treatment Gait belt   Activity Tolerance Patient tolerated treatment well   Behavior During Therapy [facility] for tasks assessed/performed      Past Medical History  Diagnosis Date  . PONV (postoperative nausea and vomiting)   . Hypertension     takes Benazepril nightly and Propranolol tid and Clonidine daily  . Hyperlipidemia     takes Zetia and Zocor daily  . History of bronchitis     last time several yrs ago  . History of migraine     many yrs ago  . Dizziness     occasionally and related to meds   . Peripheral edema   . Peripheral neuropathy (HCC)   . Arthritis     hip  . Low back pain   . Constipation     uses laxatives several times a week  . Slow urinary stream     occasionally  . Diabetes mellitus     takes Metformin and Amaryl daily  . Early cataracts, bilateral   . Depression   . Insomnia     Past Surgical History  Procedure Laterality Date  . 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 D&c/hysteroscopy/ablation    . Dilation and curettage of uterus      couple of times  . Colonoscopy    . Esophagogastroduodenoscopy    . Posterior cervical fusion/foraminotomy  10/15/2011    Procedure: POSTERIOR CERVICAL FUSION/FORAMINOTOMY LEVEL 2;  Surgeon: Kerrin ChampagneJames E Nitka, MD;  Location: [facility] OR;  Service: Orthopedics;  Laterality: N/A;  Right C6-7, C7-T1  Foraminotomy with excision HNP C7-T1  . Upper gastrointestinal endoscopy      There were no vitals filed for this visit.      Subjective Assessment - 07/28/15 0939    Subjective Nothing new to report   Patient Stated Goals Manage pain, exercises for home for strengthening.   Currently in Pain? Yes   Pain Score 2    Pain Location Knee   Pain Orientation Left   Pain Descriptors / Indicators Aching   Pain Onset More than a month ago   Pain Frequency Intermittent   Multiple Pain Sites Yes   Pain Score 3   Pain Location Back   Pain Orientation Lower   Pain Descriptors / Indicators Aching   Pain Type Chronic pain   Pain Onset More than a month ago   Pain Frequency Intermittent   Aggravating Factors  standing without UE support   Pain Relieving Factors seated rest                         [facility] Adult PT Treatment/Exercise - 07/28/15 0001    Ambulation/Gait   Ambulation/Gait Yes   Ambulation/Gait Assistance 5: Supervision   Ambulation/Gait Assistance Details Working on endurance and maintaining upright  posture  x3 standing rest breaks at    Ambulation Distance (Feet) 115 Feet  x115 no rest breaks 2nd time   Assistive device Rolling walker   Gait Pattern Decreased stride length;Trunk flexed;Narrow base of support   Ambulation Surface Level   Knee/Hip Exercises: Aerobic   Stepper Sci fit  L=1 x10 min  continuous but slow, 16 RPM             Balance Exercises - 07/28/15 0953    Balance Exercises: Standing   Standing Eyes Opened Wide (BOA);Head turns;5 reps;20 secs  Cues for maintaining upright posture, without UE support           PT Education - 07/27/15 0943    Education provided Yes   Education Details Posture with standing activities. Explained that due to the weight on pt's bag that she hangs on her walker it may hinder gait mechanics and increase fatigue with gait.   Person(s) Educated Patient   Methods Explanation;Demonstration;Verbal  cues;Handout   Comprehension Verbalized understanding;Returned demonstration;Need further instruction          PT Short Term Goals - 07/18/15 1150    PT SHORT TERM GOAL #1   Title The patient will be indep with HEP using written insturctions for cues.   Baseline Target date:  08/17/2015   Time 4   Period Weeks   PT SHORT TERM GOAL #2   Title The patient will improve sit>stand transfers to be independent moving sit>RW x 5/5 repetitions with UE support and no assist or loss of balance.   Baseline Target date 08/17/2015   Time 4   Period Weeks   PT SHORT TERM GOAL #3   Title The patient will improve walking speed from 0.56 ft/sec to > or equal to 1.0 ft/sec to demo improving functional ambulation.   Baseline Target date 6/21/20176   Time 4   Period Weeks   PT SHORT TERM GOAL #4   Title The patient will move sit<>supine with supervision to demo improved functional mobility.   Baseline Target date 08/17/2015   Time 4   Period Weeks           PT Long Term Goals - 07/19/15 1156    PT LONG TERM GOAL #1   Title The patient will be indep with HEP progression.   Baseline Target date 09/16/2015   Time 8   Period Weeks   PT LONG TERM GOAL #2   Title The patient will negotiate 4 steps with one handrail x supervision for improved household surface negotiation.   Baseline Target date 09/16/2015   Time 8   Period Weeks   PT LONG TERM GOAL #3   Title The patient will improve gait speed from 0.56 ft/sec to > or equal to 1.3 ft/sec to demo transition from safe "household" ambulator to "limited community" ambulator.   Baseline Target date 09/16/2015   Time 8   Period Weeks   PT LONG TERM GOAL #4   Title The paitent will report knee pain reduced from 8/10 to 4/10 for improved tolerance to mobility.   Baseline Target date 09/16/2015   Time 8   Period Weeks   PT LONG TERM GOAL #5   Title The patient will ambulate x 250 feet nonstop to demo improved tolerance to ambulation.   Baseline Target  date 09/16/2015   Time 8   Period Weeks               Plan - 07/28/15 1005  Clinical Impression Statement Pt's gait and standing balance is limited by fatigue and reports increase back pain with standing activity attemptef without UE support.  Pt is making slow gains with activity tolerance; did not stop the second walk around the track for rest   Rehab Potential Good   PT Frequency 2x / week   PT Duration 8 weeks   PT Treatment/Interventions ADLs/Self Care Home Management;Manual techniques;Therapeutic exercise;Therapeutic activities;Moist Heat;Cryotherapy;Neuromuscular re-education;Gait training;Stair training;DME Instruction;Functional mobility training;Patient/family education   PT Next Visit Plan   Standing postural training reducing dependence on UEs fo rsupport, standing balance, exercises in parallel bars for LE strengthening.      Patient will benefit from skilled therapeutic intervention in order to improve the following deficits and impairments:  Abnormal gait, Difficulty walking, Decreased activity tolerance, Decreased balance, Decreased mobility, Decreased strength, Postural dysfunction, Impaired flexibility, Pain, Decreased endurance  Visit Diagnosis: Other abnormalities of gait and mobility  Pain in left knee  Muscle weakness (generalized)  Knee pain, chronic, left  Pain in left hip  Posture abnormality  Abnormality of gait     Problem List Patient Active Problem List   Diagnosis Date Noted  . HLD (hyperlipidemia) 11/21/2014  . Depression 11/21/2014  . Nausea & vomiting 11/21/2014  . Sepsis (HCC) 11/21/2014  . Coughing 11/21/2014  . UTI (lower urinary tract infection) 11/21/2014  . AKI (acute kidney injury) (HCC) 11/21/2014  . Nausea with vomiting   . 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 HNP (herniated nucleus pulposus), cervical 10/15/2011    Class: Chronic  . Diabetes mellitus without complication (HCC)  09/30/2006  . Essential hypertension 09/30/2006  . DILATION AND CURETTAGE, HX OF 09/30/2006    Hortencia Conradi, PTA  07/28/2015, 1:45 PM [facility] [address] [place], [place], [address] Phone: [phone]   Fax:  [phone]  Name: Jodi Chavez MRN: [id number] Date of Birth: 03-21-1950

## 2015-08-02 ENCOUNTER — Ambulatory Visit: Payer: Medicare Other | Admitting: Rehabilitative and Restorative Service Providers"

## 2015-08-02 DIAGNOSIS — R2689 Other abnormalities of gait and mobility: Secondary | ICD-10-CM | POA: Diagnosis not present

## 2015-08-02 DIAGNOSIS — R269 Unspecified abnormalities of gait and mobility: Secondary | ICD-10-CM | POA: Diagnosis present

## 2015-08-02 DIAGNOSIS — M25562 Pain in left knee: Secondary | ICD-10-CM | POA: Diagnosis present

## 2015-08-02 DIAGNOSIS — M6281 Muscle weakness (generalized): Secondary | ICD-10-CM | POA: Diagnosis present

## 2015-08-02 DIAGNOSIS — M25552 Pain in left hip: Secondary | ICD-10-CM | POA: Diagnosis present

## 2015-08-02 DIAGNOSIS — G8929 Other chronic pain: Secondary | ICD-10-CM | POA: Diagnosis present

## 2015-08-02 DIAGNOSIS — R293 Abnormal posture: Secondary | ICD-10-CM | POA: Diagnosis present

## 2015-08-02 NOTE — Therapy (Signed)
[facility] [address] [place], [place], [address] Phone: [phone]   Fax:  [phone]  Physical Therapy Treatment  Patient Details  Name: Jodi Chavez MRN: [id number] Date of Birth: [birth year] Referring Provider: Fleet ContrasEdwin Avbuere, MD  Encounter Date: 08/02/2015      PT End of Session - 08/02/15 1237    Visit Number 5   Number of Visits 16   Date for PT Re-Evaluation 09/17/15   Authorization Type G code every 10th visit   PT Start Time 1234   PT Stop Time 1318   PT Time Calculation (min) 44 min   Equipment Utilized During Treatment Gait belt   Activity Tolerance Patient tolerated treatment well   Behavior During Therapy [facility] for tasks assessed/performed      Past Medical History  Diagnosis Date  . PONV (postoperative nausea and vomiting)   . Hypertension     takes Benazepril nightly and Propranolol tid and Clonidine daily  . Hyperlipidemia     takes Zetia and Zocor daily  . History of bronchitis     last time several yrs ago  . History of migraine     many yrs ago  . Dizziness     occasionally and related to meds   . Peripheral edema   . Peripheral neuropathy (HCC)   . Arthritis     hip  . Low back pain   . Constipation     uses laxatives several times a week  . Slow urinary stream     occasionally  . Diabetes mellitus     takes Metformin and Amaryl daily  . Early cataracts, bilateral   . Depression   . Insomnia     Past Surgical History  Procedure Laterality Date  . 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 D&c/hysteroscopy/ablation    . Dilation and curettage of uterus      couple of times  . Colonoscopy    . Esophagogastroduodenoscopy    . Posterior cervical fusion/foraminotomy  10/15/2011    Procedure: POSTERIOR CERVICAL FUSION/FORAMINOTOMY LEVEL 2;  Surgeon: Kerrin ChampagneJames E Nitka, MD;  Location: [facility] OR;  Service: Orthopedics;  Laterality: N/A;  Right C6-7, C7-T1  Foraminotomy with excision HNP C7-T1  . Upper gastrointestinal endoscopy      There were no vitals filed for this visit.      Subjective Assessment - 08/02/15 1232    Patient Stated Goals Manage pain, exercises for home for strengthening.   Currently in Pain? Yes   Pain Score 4    Pain Location Knee   Pain Orientation Left   Pain Descriptors / Indicators Aching   Pain Type Chronic pain   Pain Onset More than a month ago   Pain Frequency Intermittent   Aggravating Factors  exercise   Pain Relieving Factors medications           [facility] Adult PT Treatment/Exercise - 08/02/15 1238    Bed Mobility   Bed Mobility Supine to Sit;Sit to Supine   Supine to Sit 4: Min assist   Supine to Sit Details (indicate cue type and reason) Patient requires cues for breathing and LE assist   Sit to Supine 4: Min assist   Sit to Supine - Details (indicate cue type and reason) Patient requires assist to lift LEs and verbal cues on technique   Transfers   Transfers Sit to Stand   Sit to Stand 5: Supervision   Sit to  Stand Details (indicate cue type and reason) decreased use of UEs on walker upon rising   Ambulation/Gait   Ambulation/Gait Yes   Ambulation/Gait Assistance 5: Supervision   Ambulation/Gait Assistance Details verbal and tactile cues for postural upright and dec'd pressure through UEs   Ambulation Distance (Feet) --  100 ft x 3 repetitions   Assistive device Rolling walker   Gait Pattern Decreased stride length;Trunk flexed;Narrow base of support   Ambulation Surface Level   Gait Comments Ambulation with verbal and tactile cues on upright posture and hips moving anteriorly during stance phase of gait.   Neuro Re-ed    Neuro Re-ed Details  Standing balance exercise for upright posture of wall bumps with gluteal sets x 10 reps with SBA to CGA for balance and chair positioned anteriorly to patient.   Exercises   Exercises Knee/Hip   Knee/Hip Exercises: Aerobic   Stepper Sci fit  L=1  x10 min  continuous but slow, 16 RPM   Knee/Hip Exercises: Supine   Quad Sets AROM;10 reps   Quad Sets Limitations tactile cues for knee extension   Short Arc Quad Sets AROM   Short Arc Quad Sets Limitations tactile cues for quad firing   Heel Slides Limitations Unable to perform heel slide left without assist   Hip Adduction Isometric AROM   Hip Adduction Isometric Limitations pillow squeeze x 10   Straight Leg Raises Left;5 reps;AAROM   Straight Leg Raises Limitations Patient performed quad set and attempted to assist in lifting/ PT assisted.   Other Supine Knee/Hip Exercises Attempted thomas test stretch, too painful and PT had to assist out of position.  Performed supine hip flexor stretch working for [employer] to LEs unsupported when lying with 3 pillows.   Other Supine Knee/Hip Exercises Supine gluteal sets for hip extension x 5 reps.                  PT Short Term Goals - 07/18/15 1150    PT SHORT TERM GOAL #1   Title The patient will be indep with HEP using written insturctions for cues.   Baseline Target date:  08/17/2015   Time 4   Period Weeks   PT SHORT TERM GOAL #2   Title The patient will improve sit>stand transfers to be independent moving sit>RW x 5/5 repetitions with UE support and no assist or loss of balance.   Baseline Target date 08/17/2015   Time 4   Period Weeks   PT SHORT TERM GOAL #3   Title The patient will improve walking speed from 0.56 ft/sec to > or equal to 1.0 ft/sec to demo improving functional ambulation.   Baseline Target date 6/21/20176   Time 4   Period Weeks   PT SHORT TERM GOAL #4   Title The patient will move sit<>supine with supervision to demo improved functional mobility.   Baseline Target date 08/17/2015   Time 4   Period Weeks           PT Long Term Goals - 07/19/15 1156    PT LONG TERM GOAL #1   Title The patient will be indep with HEP progression.   Baseline Target date 09/16/2015   Time 8   Period Weeks   PT LONG  TERM GOAL #2   Title The patient will negotiate 4 steps with one handrail x supervision for improved household surface negotiation.   Baseline Target date 09/16/2015   Time 8   Period Weeks   PT LONG TERM  GOAL #3   Title The patient will improve gait speed from 0.56 ft/sec to > or equal to 1.3 ft/sec to demo transition from safe "household" ambulator to "limited community" ambulator.   Baseline Target date 09/16/2015   Time 8   Period Weeks   PT LONG TERM GOAL #4   Title The paitent will report knee pain reduced from 8/10 to 4/10 for improved tolerance to mobility.   Baseline Target date 09/16/2015   Time 8   Period Weeks   PT LONG TERM GOAL #5   Title The patient will ambulate x 250 feet nonstop to demo improved tolerance to ambulation.   Baseline Target date 09/16/2015   Time 8   Period Weeks               Plan - 08/02/15 1343    Clinical Impression Statement The patient is limited by hip tightness (specifically in hip flexors and adductors) for upright posture.  PT focused on stretching and engaging LE hip/knee musculature for improved functional mobility.  Continue to STGs/LTGs.    PT Treatment/Interventions ADLs/Self Care Home Management;Manual techniques;Therapeutic exercise;Therapeutic activities;Moist Heat;Cryotherapy;Neuromuscular re-education;Gait training;Stair training;DME Instruction;Functional mobility training;Patient/family education   PT Next Visit Plan Supine stretching hip adductors and flexors to tolerance, standing postural control reducing UE use, posture during gait, standing balance, LE strengthening to tolerance.   Consulted and Agree with Plan of Care Patient      Patient will benefit from skilled therapeutic intervention in order to improve the following deficits and impairments:  Abnormal gait, Difficulty walking, Decreased activity tolerance, Decreased balance, Decreased mobility, Decreased strength, Postural dysfunction, Impaired flexibility, Pain,  Decreased endurance  Visit Diagnosis: Other abnormalities of gait and mobility  Pain in left knee  Muscle weakness (generalized)     Problem List Patient Active Problem List   Diagnosis Date Noted  . HLD (hyperlipidemia) 11/21/2014  . Depression 11/21/2014  . Nausea & vomiting 11/21/2014  . Sepsis (HCC) 11/21/2014  . Coughing 11/21/2014  . UTI (lower urinary tract infection) 11/21/2014  . AKI (acute kidney injury) (HCC) 11/21/2014  . Nausea with vomiting   . 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 HNP (herniated nucleus pulposus), cervical 10/15/2011    Class: Chronic  . Diabetes mellitus without complication (HCC) 09/30/2006  . Essential hypertension 09/30/2006  . DILATION AND CURETTAGE, HX OF 09/30/2006    Jodi Chavez, PT 08/02/2015, 1:44 PM  [facility] [address] [place], [place], [address] Phone: [phone]   Fax:  [phone]  Name: Jodi Chavez MRN: [id number] Date of Birth: 10/21/1950

## 2015-08-04 ENCOUNTER — Ambulatory Visit: Payer: Medicare Other | Admitting: Physical Therapy

## 2015-08-04 ENCOUNTER — Encounter: Payer: Self-pay | Admitting: Physical Therapy

## 2015-08-04 DIAGNOSIS — M25562 Pain in left knee: Secondary | ICD-10-CM

## 2015-08-04 DIAGNOSIS — M6281 Muscle weakness (generalized): Secondary | ICD-10-CM

## 2015-08-04 DIAGNOSIS — R2689 Other abnormalities of gait and mobility: Secondary | ICD-10-CM | POA: Diagnosis not present

## 2015-08-04 DIAGNOSIS — M25552 Pain in left hip: Secondary | ICD-10-CM

## 2015-08-04 DIAGNOSIS — R293 Abnormal posture: Secondary | ICD-10-CM

## 2015-08-04 NOTE — Therapy (Signed)
[facility] [address] [place], [place], [address] Phone: [phone]   Fax:  [phone]  Physical Therapy Treatment  Patient Details  Name: Jodi JarvisDorcas P Chavez MRN: [id number] Date of Birth: 08/07/1950 Referring Provider: Fleet ContrasEdwin Avbuere, MD  Encounter Date: 08/04/2015      PT End of Session - 08/04/15 1100    Visit Number 6   Number of Visits 16   Date for PT Re-Evaluation 09/17/15   Authorization Type G code every 10th visit   PT Start Time 1022   PT Stop Time 1100   PT Time Calculation (min) 38 min   Equipment Utilized During Treatment Gait belt   Activity Tolerance Patient tolerated treatment well   Behavior During Therapy [facility] for tasks assessed/performed      Past Medical History  Diagnosis Date  . PONV (postoperative nausea and vomiting)   . Hypertension     takes Benazepril nightly and Propranolol tid and Clonidine daily  . Hyperlipidemia     takes Zetia and Zocor daily  . History of bronchitis     last time several yrs ago  . History of migraine     many yrs ago  . Dizziness     occasionally and related to meds   . Peripheral edema   . Peripheral neuropathy (HCC)   . Arthritis     hip  . Low back pain   . Constipation     uses laxatives several times a week  . Slow urinary stream     occasionally  . Diabetes mellitus     takes Metformin and Amaryl daily  . Early cataracts, bilateral   . Depression   . Insomnia     Past Surgical History  Procedure Laterality Date  . 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 D&c/hysteroscopy/ablation    . Dilation and curettage of uterus      couple of times  . Colonoscopy    . Esophagogastroduodenoscopy    . Posterior cervical fusion/foraminotomy  10/15/2011    Procedure: POSTERIOR CERVICAL FUSION/FORAMINOTOMY LEVEL 2;  Surgeon: Kerrin ChampagneJames E Nitka, MD;  Location: [facility] OR;  Service: Orthopedics;  Laterality: N/A;  Right C6-7, C7-T1  Foraminotomy with excision HNP C7-T1  . Upper gastrointestinal endoscopy      There were no vitals filed for this visit.      Subjective Assessment - 08/04/15 1026    Subjective Getting better at the HEP.   Patient Stated Goals Manage pain, exercises for home for strengthening.   Currently in Pain? Yes   Pain Score 6   no pain at rest, pain with ROM   Pain Location Hip   Pain Orientation Left   Pain Descriptors / Indicators Aching   Pain Type Chronic pain   Pain Radiating Towards down to knee   Pain Onset More than a month ago   Pain Frequency Intermittent                         [facility] Adult PT Treatment/Exercise - 08/04/15 0001    Bed Mobility   Bed Mobility Supine to Sit;Sit to Supine   Supine to Sit 5: Supervision  cues for sequencing   Ambulation/Gait   Ambulation/Gait Yes   Ambulation/Gait Assistance 5: Supervision   Ambulation/Gait Assistance Details cues to decrease UE support on RW   Ambulation Distance (Feet) 100 Feet   Assistive device Rolling walker   Gait  Pattern Decreased stride length;Trunk flexed;Narrow base of support   Ambulation Surface Level   Knee/Hip Exercises: Supine   Short Arc Quad Sets AROM   Heel Slides AAROM;Strengthening;Left;10 reps;Right   Hip Adduction Isometric AROM;Strengthening;20 reps  with bolster underneath knees.   Other Supine Knee/Hip Exercises Hip internal rotation with knee extended 10x2  for active left hip external rotator stretch.   Other Supine Knee/Hip Exercises Manual AA hip adductor stretch                PT Education - 08/04/15 1053    Education provided Yes   Education Details LE strengtheng and stretching in supine   Person(s) Educated Patient   Methods Explanation;Demonstration;Tactile cues;Verbal cues   Comprehension Verbalized understanding;Returned demonstration;Verbal cues required;Tactile cues required;Need further instruction          PT Short Term Goals - 07/18/15 1150    PT  SHORT TERM GOAL #1   Title The patient will be indep with HEP using written insturctions for cues.   Baseline Target date:  08/17/2015   Time 4   Period Weeks   PT SHORT TERM GOAL #2   Title The patient will improve sit>stand transfers to be independent moving sit>RW x 5/5 repetitions with UE support and no assist or loss of balance.   Baseline Target date 08/17/2015   Time 4   Period Weeks   PT SHORT TERM GOAL #3   Title The patient will improve walking speed from 0.56 ft/sec to > or equal to 1.0 ft/sec to demo improving functional ambulation.   Baseline Target date 6/21/20176   Time 4   Period Weeks   PT SHORT TERM GOAL #4   Title The patient will move sit<>supine with supervision to demo improved functional mobility.   Baseline Target date 08/17/2015   Time 4   Period Weeks           PT Long Term Goals - 07/19/15 1156    PT LONG TERM GOAL #1   Title The patient will be indep with HEP progression.   Baseline Target date 09/16/2015   Time 8   Period Weeks   PT LONG TERM GOAL #2   Title The patient will negotiate 4 steps with one handrail x supervision for improved household surface negotiation.   Baseline Target date 09/16/2015   Time 8   Period Weeks   PT LONG TERM GOAL #3   Title The patient will improve gait speed from 0.56 ft/sec to > or equal to 1.3 ft/sec to demo transition from safe "household" ambulator to "limited community" ambulator.   Baseline Target date 09/16/2015   Time 8   Period Weeks   PT LONG TERM GOAL #4   Title The paitent will report knee pain reduced from 8/10 to 4/10 for improved tolerance to mobility.   Baseline Target date 09/16/2015   Time 8   Period Weeks   PT LONG TERM GOAL #5   Title The patient will ambulate x 250 feet nonstop to demo improved tolerance to ambulation.   Baseline Target date 09/16/2015   Time 8   Period Weeks               Plan - 08/04/15 1051    Clinical Impression Statement Skilled session focused on LE  strengthening and addressing LLE tightness with stretching.  Pt requires AAROM for heelslide but able to perform with sheet around foot to help. Continues to demonstrate extensive weight bearing through UEs with gait  using walker.   PT Treatment/Interventions ADLs/Self Care Home Management;Manual techniques;Therapeutic exercise;Therapeutic activities;Moist Heat;Cryotherapy;Neuromuscular re-education;Gait training;Stair training;DME Instruction;Functional mobility training;Patient/family education   PT Next Visit Plan Supine stretching hip adductors and flexors to tolerance, standing postural control reducing UE use, posture during gait, standing balance, LE strengthening to tolerance.   Consulted and Agree with Plan of Care Patient      Patient will benefit from skilled therapeutic intervention in order to improve the following deficits and impairments:  Abnormal gait, Difficulty walking, Decreased activity tolerance, Decreased balance, Decreased mobility, Decreased strength, Postural dysfunction, Impaired flexibility, Pain, Decreased endurance  Visit Diagnosis: Muscle weakness (generalized)  Pain in left knee  Pain in left hip  Posture abnormality     Problem List Patient Active Problem List   Diagnosis Date Noted  . HLD (hyperlipidemia) 11/21/2014  . Depression 11/21/2014  . Nausea & vomiting 11/21/2014  . Sepsis (HCC) 11/21/2014  . Coughing 11/21/2014  . UTI (lower urinary tract infection) 11/21/2014  . AKI (acute kidney injury) (HCC) 11/21/2014  . Nausea with vomiting   . 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 HNP (herniated nucleus pulposus), cervical 10/15/2011    Class: Chronic  . Diabetes mellitus without complication (HCC) 09/30/2006  . Essential hypertension 09/30/2006  . DILATION AND CURETTAGE, HX OF 09/30/2006    Hortencia Conradi, PTA  08/04/2015, 4:25 PM [facility] [address] [place], [place], [address] Phone: [phone]   Fax:  [phone]  Name: Jodi Chavez MRN: [id number] Date of Birth: 1950/09/28

## 2015-08-04 NOTE — Therapy (Deleted)
[facility] [address] [place], [place], [address] Phone: [phone]   Fax:  [phone]  Physical Therapy Treatment  Patient Details  Name: Jodi Chavez MRN: [id number] Date of Birth: 1950/09/14 Referring Provider: Fleet ContrasEdwin Avbuere, MD  Encounter Date: 08/04/2015      PT End of Session - 08/04/15 1100    Visit Number 6   Number of Visits 16   Date for PT Re-Evaluation 09/17/15   Authorization Type G code every 10th visit   PT Start Time 1022   PT Stop Time 1100   PT Time Calculation (min) 38 min   Equipment Utilized During Treatment Gait belt   Activity Tolerance Patient tolerated treatment well   Behavior During Therapy [facility] for tasks assessed/performed      Past Medical History  Diagnosis Date  . PONV (postoperative nausea and vomiting)   . Hypertension     takes Benazepril nightly and Propranolol tid and Clonidine daily  . Hyperlipidemia     takes Zetia and Zocor daily  . History of bronchitis     last time several yrs ago  . History of migraine     many yrs ago  . Dizziness     occasionally and related to meds   . Peripheral edema   . Peripheral neuropathy (HCC)   . Arthritis     hip  . Low back pain   . Constipation     uses laxatives several times a week  . Slow urinary stream     occasionally  . Diabetes mellitus     takes Metformin and Amaryl daily  . Early cataracts, bilateral   . Depression   . Insomnia     Past Surgical History  Procedure Laterality Date  . 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 D&c/hysteroscopy/ablation    . Dilation and curettage of uterus      couple of times  . Colonoscopy    . Esophagogastroduodenoscopy    . Posterior cervical fusion/foraminotomy  10/15/2011    Procedure: POSTERIOR CERVICAL FUSION/FORAMINOTOMY LEVEL 2;  Surgeon: Kerrin ChampagneJames E Nitka, MD;  Location: [facility] OR;  Service: Orthopedics;  Laterality: N/A;  Right C6-7, C7-T1  Foraminotomy with excision HNP C7-T1  . Upper gastrointestinal endoscopy      There were no vitals filed for this visit.      Subjective Assessment - 08/04/15 1026    Subjective Getting better at the HEP.   Patient Stated Goals Manage pain, exercises for home for strengthening.   Currently in Pain? Yes   Pain Score 6   no pain at rest, pain with ROM   Pain Location Hip   Pain Orientation Left   Pain Descriptors / Indicators Aching   Pain Type Chronic pain   Pain Radiating Towards down to knee   Pain Onset More than a month ago   Pain Frequency Intermittent                         [facility] Adult PT Treatment/Exercise - 08/04/15 0001    Bed Mobility   Bed Mobility Supine to Sit;Sit to Supine   Supine to Sit 5: Supervision  cues for sequencing   Knee/Hip Exercises: Supine   Short Arc Quad Sets AROM   Heel Slides AAROM;Strengthening;Left;10 reps;Right   Hip Adduction Isometric AROM;Strengthening;20 reps  with bolster underneath knees.   Other Supine Knee/Hip Exercises Hip internal rotation with knee  extended 10x2  for active left hip external rotator stretch.   Other Supine Knee/Hip Exercises Manual AA hip adductor stretch                PT Education - 08/04/15 1053    Education provided Yes   Education Details LE strengtheng and stretching in supine   Person(s) Educated Patient   Methods Explanation;Demonstration;Tactile cues;Verbal cues   Comprehension Verbalized understanding;Returned demonstration;Verbal cues required;Tactile cues required;Need further instruction          PT Short Term Goals - 07/18/15 1150    PT SHORT TERM GOAL #1   Title The patient will be indep with HEP using written insturctions for cues.   Baseline Target date:  08/17/2015   Time 4   Period Weeks   PT SHORT TERM GOAL #2   Title The patient will improve sit>stand transfers to be independent moving sit>RW x 5/5 repetitions with UE support and no assist or loss of  balance.   Baseline Target date 08/17/2015   Time 4   Period Weeks   PT SHORT TERM GOAL #3   Title The patient will improve walking speed from 0.56 ft/sec to > or equal to 1.0 ft/sec to demo improving functional ambulation.   Baseline Target date 6/21/20176   Time 4   Period Weeks   PT SHORT TERM GOAL #4   Title The patient will move sit<>supine with supervision to demo improved functional mobility.   Baseline Target date 08/17/2015   Time 4   Period Weeks           PT Long Term Goals - 07/19/15 1156    PT LONG TERM GOAL #1   Title The patient will be indep with HEP progression.   Baseline Target date 09/16/2015   Time 8   Period Weeks   PT LONG TERM GOAL #2   Title The patient will negotiate 4 steps with one handrail x supervision for improved household surface negotiation.   Baseline Target date 09/16/2015   Time 8   Period Weeks   PT LONG TERM GOAL #3   Title The patient will improve gait speed from 0.56 ft/sec to > or equal to 1.3 ft/sec to demo transition from safe "household" ambulator to "limited community" ambulator.   Baseline Target date 09/16/2015   Time 8   Period Weeks   PT LONG TERM GOAL #4   Title The paitent will report knee pain reduced from 8/10 to 4/10 for improved tolerance to mobility.   Baseline Target date 09/16/2015   Time 8   Period Weeks   PT LONG TERM GOAL #5   Title The patient will ambulate x 250 feet nonstop to demo improved tolerance to ambulation.   Baseline Target date 09/16/2015   Time 8   Period Weeks               Plan - 08/04/15 1051    Clinical Impression Statement Skilled session focused on LE strengthening and addressing LLE tightness with stretching.  Pt requires AAROM for heelslide but able to perform with sheet around foot to help. Continues to demonstrate extensive weight bearing through UEs with gait using walker.   PT Treatment/Interventions ADLs/Self Care Home Management;Manual techniques;Therapeutic  exercise;Therapeutic activities;Moist Heat;Cryotherapy;Neuromuscular re-education;Gait training;Stair training;DME Instruction;Functional mobility training;Patient/family education   PT Next Visit Plan Supine stretching hip adductors and flexors to tolerance, standing postural control reducing UE use, posture during gait, standing balance, LE strengthening to tolerance.   Consulted and  Agree with Plan of Care Patient      Patient will benefit from skilled therapeutic intervention in order to improve the following deficits and impairments:  Abnormal gait, Difficulty walking, Decreased activity tolerance, Decreased balance, Decreased mobility, Decreased strength, Postural dysfunction, Impaired flexibility, Pain, Decreased endurance  Visit Diagnosis: Muscle weakness (generalized)  Pain in left knee  Pain in left hip  Posture abnormality     Problem List Patient Active Problem List   Diagnosis Date Noted  . HLD (hyperlipidemia) 11/21/2014  . Depression 11/21/2014  . Nausea & vomiting 11/21/2014  . Sepsis (HCC) 11/21/2014  . Coughing 11/21/2014  . UTI (lower urinary tract infection) 11/21/2014  . AKI (acute kidney injury) (HCC) 11/21/2014  . Nausea with vomiting   . 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 HNP (herniated nucleus pulposus), cervical 10/15/2011    Class: Chronic  . Diabetes mellitus without complication (HCC) 09/30/2006  . Essential hypertension 09/30/2006  . DILATION AND CURETTAGE, HX OF 09/30/2006    Hortencia Conradi, PTA  08/04/2015, 4:22 PM [facility] [address] [place], [place], [address] Phone: [phone]   Fax:  [phone]  Name: Jodi Chavez MRN: [id number] Date of Birth: September 27, 1950

## 2015-08-09 ENCOUNTER — Ambulatory Visit: Payer: Medicare Other | Admitting: Dietician

## 2015-08-10 ENCOUNTER — Ambulatory Visit: Payer: Medicare Other | Admitting: Rehabilitative and Restorative Service Providers"

## 2015-08-10 DIAGNOSIS — M6281 Muscle weakness (generalized): Secondary | ICD-10-CM

## 2015-08-10 DIAGNOSIS — R2689 Other abnormalities of gait and mobility: Secondary | ICD-10-CM

## 2015-08-10 DIAGNOSIS — M25562 Pain in left knee: Secondary | ICD-10-CM

## 2015-08-10 NOTE — Therapy (Signed)
[facility] [address] [place], [place], [address] Phone: [phone]   Fax:  [phone]  Physical Therapy Treatment  Patient Details  Name: Jodi Chavez MRN: [id number] Date of Birth: 30-Nov-1950 Referring Provider: Fleet Contras, MD  Encounter Date: 08/10/2015      PT End of Session - 08/10/15 1148    Visit Number 7   Number of Visits 16   Date for PT Re-Evaluation 09/17/15   Authorization Type G code every 10th visit   PT Start Time 1106   PT Stop Time 1146   PT Time Calculation (min) 40 min   Equipment Utilized During Treatment Gait belt   Activity Tolerance Patient tolerated treatment well   Behavior During Therapy [facility] for tasks assessed/performed      Past Medical History  Diagnosis Date  . PONV (postoperative nausea and vomiting)   . Hypertension     takes Benazepril nightly and Propranolol tid and Clonidine daily  . Hyperlipidemia     takes Zetia and Zocor daily  . History of bronchitis     last time several yrs ago  . History of migraine     many yrs ago  . Dizziness     occasionally and related to meds   . Peripheral edema   . Peripheral neuropathy (HCC)   . Arthritis     hip  . Low back pain   . Constipation     uses laxatives several times a week  . Slow urinary stream     occasionally  . Diabetes mellitus     takes Metformin and Amaryl daily  . Early cataracts, bilateral   . Depression   . Insomnia     Past Surgical History  Procedure Laterality Date  . 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 D&c/hysteroscopy/ablation    . Dilation and curettage of uterus      couple of times  . Colonoscopy    . Esophagogastroduodenoscopy    . Posterior cervical fusion/foraminotomy  10/15/2011    Procedure: POSTERIOR CERVICAL FUSION/FORAMINOTOMY LEVEL 2;  Surgeon: Kerrin Champagne, MD;  Location: [facility] OR;  Service: Orthopedics;  Laterality: N/A;  Right C6-7, C7-T1  Foraminotomy with excision HNP C7-T1  . Upper gastrointestinal endoscopy      There were no vitals filed for this visit.      Subjective Assessment - 08/10/15 1109    Subjective The patient reports that she can't stand for long periods during ADLs.     Patient Stated Goals Manage pain, exercises for home for strengthening.   Currently in Pain? Yes   Pain Score 4    Pain Location Knee   Pain Orientation Left   Pain Descriptors / Indicators Aching   Pain Type Chronic pain   Pain Onset More than a month ago   Pain Frequency Intermittent   Aggravating Factors  exercise   Pain Relieving Factors medications                 [facility] Adult PT Treatment/Exercise - 08/10/15 0001    Bed Mobility   Bed Mobility Sit to Supine;Supine to Sit   Supine to Sit 4: Min assist   Supine to Sit Details (indicate cue type and reason) Patient required LE assist and cues for breathing   Sit to Supine 4: Min assist   Sit to Supine - Details (indicate cue type and reason) same   Transfers   Transfers Sit  to Stand   Sit to Stand 5: Supervision   Sit to Stand Details (indicate cue type and reason) x 5 reps with cues on upright posture and dec'ing UE support upon rising   Ambulation/Gait   Ambulation/Gait Yes   Ambulation/Gait Assistance 5: Supervision   Ambulation/Gait Assistance Details cues to decrease UE support   Ambulation Distance (Feet) 115 Feet  100, 2 repetitions with cues on posture   Assistive device Rolling walker   Gait Pattern Decreased stride length;Trunk flexed;Narrow base of support   Ambulation Surface Level   Gait Comments Also ambulated with SPC x 8 reps x 2 sets initially with +2 assist for safety and then with CGA of 1 person near support surface.   Neuro Re-ed    Neuro Re-ed Details  Wall bumps with cues on upright posture; standing reaching tasks at countertop reaching up to cabinets.   Exercises   Exercises Knee/Hip   Knee/Hip Exercises: Stretches   Other Knee/Hip  Stretches Supine working towards straightening LEs on mat with tactile cues and contract relax used for muscle length.   Other Knee/Hip Stretches Press downs for hip flexor stretch attempting to position each leg on mat in supine.  Hooklying with hip adductor stretch, hip ab/adduction with passive overpressure.  Gentle hip adductor stretching with gentle traction at leg    Knee/Hip Exercises: Standing   Heel Raises 10 reps   Knee/Hip Exercises: Supine   Quad Sets AROM;10 reps   Short Arc Quad Sets AROM;10 reps;Both   Other Supine Knee/Hip Exercises Hip internal/external rotation to tolerance.                  PT Short Term Goals - 07/18/15 1150    PT SHORT TERM GOAL #1   Title The patient will be indep with HEP using written insturctions for cues.   Baseline Target date:  08/17/2015   Time 4   Period Weeks   PT SHORT TERM GOAL #2   Title The patient will improve sit>stand transfers to be independent moving sit>RW x 5/5 repetitions with UE support and no assist or loss of balance.   Baseline Target date 08/17/2015   Time 4   Period Weeks   PT SHORT TERM GOAL #3   Title The patient will improve walking speed from 0.56 ft/sec to > or equal to 1.0 ft/sec to demo improving functional ambulation.   Baseline Target date 6/21/20176   Time 4   Period Weeks   PT SHORT TERM GOAL #4   Title The patient will move sit<>supine with supervision to demo improved functional mobility.   Baseline Target date 08/17/2015   Time 4   Period Weeks           PT Long Term Goals - 07/19/15 1156    PT LONG TERM GOAL #1   Title The patient will be indep with HEP progression.   Baseline Target date 09/16/2015   Time 8   Period Weeks   PT LONG TERM GOAL #2   Title The patient will negotiate 4 steps with one handrail x supervision for improved household surface negotiation.   Baseline Target date 09/16/2015   Time 8   Period Weeks   PT LONG TERM GOAL #3   Title The patient will improve gait  speed from 0.56 ft/sec to > or equal to 1.3 ft/sec to demo transition from safe "household" ambulator to "limited community" ambulator.   Baseline Target date 09/16/2015   Time 8  Period Weeks   PT LONG TERM GOAL #4   Title The paitent will report knee pain reduced from 8/10 to 4/10 for improved tolerance to mobility.   Baseline Target date 09/16/2015   Time 8   Period Weeks   PT LONG TERM GOAL #5   Title The patient will ambulate x 250 feet nonstop to demo improved tolerance to ambulation.   Baseline Target date 09/16/2015   Time 8   Period Weeks               Plan - 08/10/15 1410    Clinical Impression Statement Patient continues with left lateral knee pain associated with tightness in hip adductors, IT band, hip flexors.  PT continuing to emphasize LE flexibility, strengthening and posture for improved functional mobility.    PT Treatment/Interventions ADLs/Self Care Home Management;Manual techniques;Therapeutic exercise;Therapeutic activities;Moist Heat;Cryotherapy;Neuromuscular re-education;Gait training;Stair training;DME Instruction;Functional mobility training;Patient/family education   PT Next Visit Plan LE stretching, posture in standing, gait dec'ing UE support on RW (can try cane near support surface), LE strengthening   Consulted and Agree with Plan of Care Patient      Patient will benefit from skilled therapeutic intervention in order to improve the following deficits and impairments:  Abnormal gait, Difficulty walking, Decreased activity tolerance, Decreased balance, Decreased mobility, Decreased strength, Postural dysfunction, Impaired flexibility, Pain, Decreased endurance  Visit Diagnosis: Pain in left knee  Muscle weakness (generalized)  Other abnormalities of gait and mobility     Problem List Patient Active Problem List   Diagnosis Date Noted  . HLD (hyperlipidemia) 11/21/2014  . Depression 11/21/2014  . Nausea & vomiting 11/21/2014  . Sepsis  (HCC) 11/21/2014  . Coughing 11/21/2014  . UTI (lower urinary tract infection) 11/21/2014  . AKI (acute kidney injury) (HCC) 11/21/2014  . Nausea with vomiting   . 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 HNP (herniated nucleus pulposus), cervical 10/15/2011    Class: Chronic  . Diabetes mellitus without complication (HCC) 09/30/2006  . Essential hypertension 09/30/2006  . DILATION AND CURETTAGE, HX OF 09/30/2006    Rondal Vandevelde, PT 08/10/2015, 2:17 PM  [facility] [address] [place], [place], [address] Phone: [phone]   Fax:  [phone]  Name: Jodi Chavez MRN: [id number] Date of Birth: [birth year]

## 2015-08-12 ENCOUNTER — Ambulatory Visit: Payer: Medicare Other | Admitting: Rehabilitative and Restorative Service Providers"

## 2015-08-12 DIAGNOSIS — M25562 Pain in left knee: Secondary | ICD-10-CM

## 2015-08-12 DIAGNOSIS — R2689 Other abnormalities of gait and mobility: Secondary | ICD-10-CM

## 2015-08-12 DIAGNOSIS — M6281 Muscle weakness (generalized): Secondary | ICD-10-CM

## 2015-08-12 NOTE — Therapy (Signed)
[facility] [address] [place], [name], [address] Phone: [phone]   Fax:  [phone]  Physical Therapy Treatment  Patient Details  Name: Jodi Chavez MRN: [id number] Date of Birth: 04-18-1950 Referring Provider: Nolene Ebbs, MD  Encounter Date: 08/12/2015      PT End of Session - 08/12/15 1206    Visit Number 8   Number of Visits 16   Date for PT Re-Evaluation 09/17/15   Authorization Type G code every 10th visit   PT Start Time 1100   PT Stop Time 1146   PT Time Calculation (min) 46 min   Equipment Utilized During Treatment Gait belt   Activity Tolerance Patient tolerated treatment well   Behavior During Therapy [facility] for tasks assessed/performed      Past Medical History  Diagnosis Date  . PONV (postoperative nausea and vomiting)   . Hypertension     takes Benazepril nightly and Propranolol tid and Clonidine daily  . Hyperlipidemia     takes Zetia and Zocor daily  . History of bronchitis     last time several yrs ago  . History of migraine     many yrs ago  . Dizziness     occasionally and related to meds   . Peripheral edema   . Peripheral neuropathy ([place])   . Arthritis     hip  . Low back pain   . Constipation     uses laxatives several times a week  . Slow urinary stream     occasionally  . Diabetes mellitus     takes Metformin and Amaryl daily  . Early cataracts, bilateral   . Depression   . Insomnia     Past Surgical History  Procedure Laterality Date  . 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 D&c/hysteroscopy/ablation    . Dilation and curettage of uterus      couple of times  . Colonoscopy    . Esophagogastroduodenoscopy    . Posterior cervical fusion/foraminotomy  10/15/2011    Procedure: POSTERIOR CERVICAL FUSION/FORAMINOTOMY LEVEL 2;  Surgeon: Jessy Oto, MD;  Location: [facility];  Service: Orthopedics;  Laterality: N/A;  Right C6-7, C7-T1  Foraminotomy with excision HNP C7-T1  . Upper gastrointestinal endoscopy      There were no vitals filed for this visit.      Subjective Assessment - 08/12/15 1106    Subjective The patient reports she doesn't have pain at rest today.  She was sore after therapy, but recovered within the day.   Patient Stated Goals Manage pain, exercises for home for strengthening.   Currently in Pain? Yes   Pain Score --  hurts during walking activities           [facility] Adult PT Treatment/Exercise - 08/12/15 1108    Ambulation/Gait   Ambulation/Gait Yes   Ambulation/Gait Assistance 5: Supervision   Ambulation/Gait Assistance Details Also performed gait with SPC with R HHA x 10 feet x 4 reps, 50 feet with min A and cues for upright posture.   Ambulation Distance (Feet) 115 Feet  rest and then 115 x 2   Assistive device Rolling walker   Gait Pattern Decreased stride length;Trunk flexed;Narrow base of support   Ambulation Surface Level   Exercises   Exercises Knee/Hip;Other Exercises   Other Exercises  Supine rolling with clapping R and L sides and return to midline "T" for posture and core strengthening.  Knee/Hip Exercises: Seated   Long Arc Quad 5 reps;Right;Left   Marching 5 reps;Right;Left   Sit to [employer] 5 reps;without UE support   Knee/Hip Exercises: Supine   Quad Sets AROM;10 reps   Short Arc Quad Sets AROM;10 reps   Other Supine Knee/Hip Exercises Hip ab/adduction with cues, trunk rotation for IT band stretch supine, and supine marching with cues for lifting higher.  Physioball rolls to/from hips with min A x 5 reps.           PT Short Term Goals - 08/12/15 1205    PT SHORT TERM GOAL #1   Title The patient will be indep with HEP using written insturctions for cues.   Baseline Target date:  08/17/2015   Time 4   Period Weeks   Status On-going   PT SHORT TERM GOAL #2   Title The patient will improve sit>stand transfers to be independent moving sit>RW x 5/5 repetitions with UE  support and no assist or loss of balance.   Baseline Met on 08/12/2015   Time 4   Period Weeks   Status Achieved   PT SHORT TERM GOAL #3   Title The patient will improve walking speed from 0.56 ft/sec to > or equal to 1.0 ft/sec to demo improving functional ambulation.   Baseline Target date 6/21/20176   Time 4   Period Weeks   Status On-going   PT SHORT TERM GOAL #4   Title The patient will move sit<>supine with supervision to demo improved functional mobility.   Baseline Target date 08/17/2015   Time 4   Period Weeks   Status On-going           PT Long Term Goals - 07/19/15 1156    PT LONG TERM GOAL #1   Title The patient will be indep with HEP progression.   Baseline Target date 09/16/2015   Time 8   Period Weeks   PT LONG TERM GOAL #2   Title The patient will negotiate 4 steps with one handrail x supervision for improved household surface negotiation.   Baseline Target date 09/16/2015   Time 8   Period Weeks   PT LONG TERM GOAL #3   Title The patient will improve gait speed from 0.56 ft/sec to > or equal to 1.3 ft/sec to demo transition from safe "household" ambulator to "limited community" ambulator.   Baseline Target date 09/16/2015   Time 8   Period Weeks   PT LONG TERM GOAL #4   Title The paitent will report knee pain reduced from 8/10 to 4/10 for improved tolerance to mobility.   Baseline Target date 09/16/2015   Time 8   Period Weeks   PT LONG TERM GOAL #5   Title The patient will ambulate x 250 feet nonstop to demo improved tolerance to ambulation.   Baseline Target date 09/16/2015   Time 8   Period Weeks               Plan - 08/12/15 1205    Clinical Impression Statement The patient met STG for sit<>stand without UE support 5/5 times.  She is improving walking speed per observation- to reassess STGs next week.  Patient c/o low back/hip discomfort with upright posture- PT to address low back stretching for improved gait at next session.   PT  Treatment/Interventions ADLs/Self Care Home Management;Manual techniques;Therapeutic exercise;Therapeutic activities;Moist Heat;Cryotherapy;Neuromuscular re-education;Gait training;Stair training;DME Instruction;Functional mobility training;Patient/family education   PT Next Visit Plan Low back stretch (towel roll hooklying,  pelvic tilts, sit<>stand), gait with emphasis on posture, IT band stretch L  CHECK STGS NEXT WEEK   Consulted and Agree with Plan of Care Patient      Patient will benefit from skilled therapeutic intervention in order to improve the following deficits and impairments:  Abnormal gait, Difficulty walking, Decreased activity tolerance, Decreased balance, Decreased mobility, Decreased strength, Postural dysfunction, Impaired flexibility, Pain, Decreased endurance  Visit Diagnosis: No diagnosis found.     Problem List Patient Active Problem List   Diagnosis Date Noted  . HLD (hyperlipidemia) 11/21/2014  . Depression 11/21/2014  . Nausea & vomiting 11/21/2014  . Sepsis ([place]) 11/21/2014  . Coughing 11/21/2014  . UTI (lower urinary tract infection) 11/21/2014  . AKI (acute kidney injury) ([place]) 11/21/2014  . Nausea with vomiting   . 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 HNP (herniated nucleus pulposus), cervical 10/15/2011    Class: Chronic  . Diabetes mellitus without complication ([place]) 95/97/4718  . Essential hypertension 09/30/2006  . DILATION AND CURETTAGE, HX OF 09/30/2006    Kathryne Ramella, PT 08/12/2015, 12:07 PM  [facility] [address] [place], [name], [address] Phone: [phone]   Fax:  [phone]  Name: Jodi Chavez MRN: [id number] Date of Birth: 31-Mar-1950

## 2015-08-16 ENCOUNTER — Ambulatory Visit: Payer: Medicare Other | Admitting: Rehabilitative and Restorative Service Providers"

## 2015-08-16 DIAGNOSIS — M25562 Pain in left knee: Secondary | ICD-10-CM

## 2015-08-16 DIAGNOSIS — R2689 Other abnormalities of gait and mobility: Secondary | ICD-10-CM | POA: Diagnosis not present

## 2015-08-16 DIAGNOSIS — M6281 Muscle weakness (generalized): Secondary | ICD-10-CM

## 2015-08-16 NOTE — Therapy (Signed)
[name], [name], [address] Phone: [phone]   Fax:  [phone]  Physical Therapy Treatment  Patient Details  Name: Jodi Chavez MRN: [id number] Date of Birth: 07/09/1950 Referring Provider: Nolene Ebbs, MD  Encounter Date: 08/16/2015      PT End of Session - 08/16/15 1622    Visit Number 9   Number of Visits 16   Date for PT Re-Evaluation 09/17/15   Authorization Type G code every 10th visit   PT Start Time 1155   PT Stop Time 1238   PT Time Calculation (min) 43 min   Equipment Utilized During Treatment Gait belt   Activity Tolerance Patient tolerated treatment well   Behavior During Therapy [facility] for tasks assessed/performed      Past Medical History  Diagnosis Date  . PONV (postoperative nausea and vomiting)   . Hypertension     takes Benazepril nightly and Propranolol tid and Clonidine daily  . Hyperlipidemia     takes Zetia and Zocor daily  . History of bronchitis     last time several yrs ago  . History of migraine     many yrs ago  . Dizziness     occasionally and related to meds   . Peripheral edema   . Peripheral neuropathy ([place])   . Arthritis     hip  . Low back pain   . Constipation     uses laxatives several times a week  . Slow urinary stream     occasionally  . Diabetes mellitus     takes Metformin and Amaryl daily  . Early cataracts, bilateral   . Depression   . Insomnia     Past Surgical History  Procedure Laterality Date  . 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 D&c/hysteroscopy/ablation    . Dilation and curettage of uterus      couple of times  . Colonoscopy    . Esophagogastroduodenoscopy    . Posterior cervical fusion/foraminotomy  10/15/2011    Procedure: POSTERIOR CERVICAL FUSION/FORAMINOTOMY LEVEL 2;  Surgeon: Jessy Oto, MD;  Location: [facility];  Service: Orthopedics;  Laterality: N/A;  Right C6-7, C7-T1  Foraminotomy with excision HNP C7-T1  . Upper gastrointestinal endoscopy      There were no vitals filed for this visit.      Subjective Assessment - 08/16/15 1201    Subjective The patient reports she is walking better at home.  She reports home exercises are going well and she is trying to do them regularly.   Patient Stated Goals Manage pain, exercises for home for strengthening.   Currently in Pain? No/denies  at rest today            [facility] Adult PT Treatment/Exercise - 08/16/15 1206    Ambulation/Gait   Ambulation/Gait Yes   Ambulation/Gait Assistance 5: Supervision   Ambulation Distance (Feet) 115 Feet  rest and then 115 x 2; then with SPC x 20 feet x 3 reps with HHA   Assistive device Rolling walker   Gait Pattern Decreased stride length;Trunk flexed;Narrow base of support   Ambulation Surface Level   Exercises   Exercises Knee/Hip;Other Exercises   Other Exercises  LAQ with cues on knee adduction prior to extension of knee x 5 reps with left LE;    Knee/Hip Exercises: Seated   Long Arc Quad 5 reps;Right;Left   Marching --   Sit to [employer] 5  reps;without UE support   Knee/Hip Exercises: Supine   Quad Sets AROM;10 reps   Short Arc Quad Sets AROM;10 reps   Heel Slides AROM;10 reps;Left   Straight Leg Raises AROM;5 reps  with cues for quad set to initiate movement   Other Supine Knee/Hip Exercises Hip ab/adduction with cues, trunk rotation for IT band stretch supine, and supine marching with cues for lifting higher.  Physioball rolls to/from hips with min A x 5 reps.            PT Short Term Goals - 08/12/15 1205    PT SHORT TERM GOAL #1   Title The patient will be indep with HEP using written insturctions for cues.   Baseline Target date:  08/17/2015   Time 4   Period Weeks   Status On-going   PT SHORT TERM GOAL #2   Title The patient will improve sit>stand transfers to be independent moving sit>RW x 5/5 repetitions with UE support and no assist or loss of  balance.   Baseline Met on 08/12/2015   Time 4   Period Weeks   Status Achieved   PT SHORT TERM GOAL #3   Title The patient will improve walking speed from 0.56 ft/sec to > or equal to 1.0 ft/sec to demo improving functional ambulation.   Baseline Target date 6/21/20176   Time 4   Period Weeks   Status On-going   PT SHORT TERM GOAL #4   Title The patient will move sit<>supine with supervision to demo improved functional mobility.   Baseline Target date 08/17/2015   Time 4   Period Weeks   Status On-going           PT Long Term Goals - 07/19/15 1156    PT LONG TERM GOAL #1   Title The patient will be indep with HEP progression.   Baseline Target date 09/16/2015   Time 8   Period Weeks   PT LONG TERM GOAL #2   Title The patient will negotiate 4 steps with one handrail x supervision for improved household surface negotiation.   Baseline Target date 09/16/2015   Time 8   Period Weeks   PT LONG TERM GOAL #3   Title The patient will improve gait speed from 0.56 ft/sec to > or equal to 1.3 ft/sec to demo transition from safe "household" ambulator to "limited community" ambulator.   Baseline Target date 09/16/2015   Time 8   Period Weeks   PT LONG TERM GOAL #4   Title The paitent will report knee pain reduced from 8/10 to 4/10 for improved tolerance to mobility.   Baseline Target date 09/16/2015   Time 8   Period Weeks   PT LONG TERM GOAL #5   Title The patient will ambulate x 250 feet nonstop to demo improved tolerance to ambulation.   Baseline Target date 09/16/2015   Time 8   Period Weeks               Plan - 08/16/15 1622    Clinical Impression Statement The patient is demonstrating improved L LE strength this week with improved straight leg raise, LAQ and SAQ.  She also can tolerate lying supine with L Leg etended on mat without hip pain.  PT to check STGs next visit and progress activities to aptient tolerance.    PT Treatment/Interventions ADLs/Self Care Home  Management;Manual techniques;Therapeutic exercise;Therapeutic activities;Moist Heat;Cryotherapy;Neuromuscular re-education;Gait training;Stair training;DME Instruction;Functional mobility training;Patient/family education   PT Next Visit Plan Low  back stretch (towel roll hooklying, pelvic tilts, sit<>stand), gait with emphasis on posture, IT band stretch L  CHECK STGS NEXT WEEK   Consulted and Agree with Plan of Care Patient      Patient will benefit from skilled therapeutic intervention in order to improve the following deficits and impairments:  Abnormal gait, Difficulty walking, Decreased activity tolerance, Decreased balance, Decreased mobility, Decreased strength, Postural dysfunction, Impaired flexibility, Pain, Decreased endurance  Visit Diagnosis: Pain in left knee  Muscle weakness (generalized)  Other abnormalities of gait and mobility     Problem List Patient Active Problem List   Diagnosis Date Noted  . HLD (hyperlipidemia) 11/21/2014  . Depression 11/21/2014  . Nausea & vomiting 11/21/2014  . Sepsis ([place]) 11/21/2014  . Coughing 11/21/2014  . UTI (lower urinary tract infection) 11/21/2014  . AKI (acute kidney injury) ([place]) 11/21/2014  . Nausea with vomiting   . 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 HNP (herniated nucleus pulposus), cervical 10/15/2011    Class: Chronic  . Diabetes mellitus without complication ([place]) 07/46/0029  . Essential hypertension 09/30/2006  . DILATION AND CURETTAGE, HX OF 09/30/2006    Ronia Hazelett , PT  08/16/2015, 4:23 PM  [facility] [address] [place], [name], [address] Phone: [phone]   Fax:  [phone]  Name: Jodi Chavez MRN: [id number] Date of Birth: March 17, 1950

## 2015-08-18 ENCOUNTER — Ambulatory Visit: Payer: Medicare Other | Admitting: Rehabilitative and Restorative Service Providers"

## 2015-08-18 DIAGNOSIS — M25562 Pain in left knee: Secondary | ICD-10-CM

## 2015-08-18 DIAGNOSIS — M6281 Muscle weakness (generalized): Secondary | ICD-10-CM

## 2015-08-18 DIAGNOSIS — R2689 Other abnormalities of gait and mobility: Secondary | ICD-10-CM | POA: Diagnosis not present

## 2015-08-18 NOTE — Therapy (Signed)
[facility] [address] [place], [name], [address] Phone: [phone]   Fax:  [phone]  Physical Therapy Treatment  Patient Details  Name: Jodi Chavez MRN: [id number] Date of Birth: 03-Nov-1950 Referring Provider: Nolene Ebbs, MD  Encounter Date: 08/18/2015      PT End of Session - 08/18/15 1119    Visit Number 10   Number of Visits 16   Date for PT Re-Evaluation 09/17/15   Authorization Type G code every 10th visit   PT Start Time 1115   PT Stop Time 1200   PT Time Calculation (min) 45 min   Equipment Utilized During Treatment Gait belt   Activity Tolerance Patient tolerated treatment well   Behavior During Therapy [facility] for tasks assessed/performed      Past Medical History  Diagnosis Date  . PONV (postoperative nausea and vomiting)   . Hypertension     takes Benazepril nightly and Propranolol tid and Clonidine daily  . Hyperlipidemia     takes Zetia and Zocor daily  . History of bronchitis     last time several yrs ago  . History of migraine     many yrs ago  . Dizziness     occasionally and related to meds   . Peripheral edema   . Peripheral neuropathy ([place])   . Arthritis     hip  . Low back pain   . Constipation     uses laxatives several times a week  . Slow urinary stream     occasionally  . Diabetes mellitus     takes Metformin and Amaryl daily  . Early cataracts, bilateral   . Depression   . Insomnia     Past Surgical History  Procedure Laterality Date  . 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 D&c/hysteroscopy/ablation    . Dilation and curettage of uterus      couple of times  . Colonoscopy    . Esophagogastroduodenoscopy    . Posterior cervical fusion/foraminotomy  10/15/2011    Procedure: POSTERIOR CERVICAL FUSION/FORAMINOTOMY LEVEL 2;  Surgeon: Jessy Oto, MD;  Location: [facility];  Service: Orthopedics;  Laterality: N/A;  Right C6-7, C7-T1  Foraminotomy with excision HNP C7-T1  . Upper gastrointestinal endoscopy      There were no vitals filed for this visit.      Subjective Assessment - 08/18/15 1118    Subjective [place]   Patient Stated Goals Manage pain, exercises for home for strengthening.   Currently in Pain? No/denies            [facility] PT Assessment - 08/18/15 1130    Ambulation/Gait   Ambulation/Gait Yes   Ambulation/Gait Assistance 5: Supervision   Ambulation Distance (Feet) 150 Feet   Ambulation Surface Level   Gait velocity 1.35 ft/sec    Ambulated x 175 feet x 2 reps with cues on more reciprocal pattern and step through gait pattern Walking forward/backwards with UE support and CGA in parallel bars emphasizing longer stride length.  NEUROMUSCULAR RE-EDUCATION: Standing wall bumps with RW in front of her and SBA with verbal cues for postural upright Standing dec'ing UE support on RW for improved gait mechanics/balance Standing trunk rotation for posture re-education Standing posture re-ed with trunk flexion/extension x 10 reps  THERAPEUTIC EXERCISE: Standing marching with UE support x 5 reps each side Standing IT band stretch  in parallel bars leaning to L side with min A  THERAPEUTIC ACTIVITIES: Worked on technique with sit<>supine x 3 reps with cues for patient to count aloud for improved breath support, move sit>sidelying before rolling supine Discussed behavior modification to increase walking at home by walking before meal times and getting out of house with sister more often as tolerated and safe        PT Short Term Goals - 08/18/15 1120    PT SHORT TERM GOAL #1   Title The patient will be indep with HEP using written insturctions for cues.   Baseline Met per subjective reports on 08/18/2015   Time 4   Period Weeks   Status Achieved   PT SHORT TERM GOAL #2   Title The patient will improve sit>stand transfers  to be independent moving sit>RW x 5/5 repetitions with UE support and no assist or loss of balance.   Baseline Met on 08/12/2015   Time 4   Period Weeks   Status Achieved   PT SHORT TERM GOAL #3   Title The patient will improve walking speed from 0.56 ft/sec to > or equal to 1.0 ft/sec to demo improving functional ambulation.   Baseline Improved to 08/18/2015 to 1.35 ft/sec with Rw.   Time 4   Period Weeks   Status Achieved   PT SHORT TERM GOAL #4   Title The patient will move sit<>supine with supervision to demo improved functional mobility.   Baseline Patient needs supervision and verbal cues to perform and coaching on technique.   Time 4   Period Weeks   Status Achieved           PT Long Term Goals - 07/19/15 1156    PT LONG TERM GOAL #1   Title The patient will be indep with HEP progression.   Baseline Target date 09/16/2015   Time 8   Period Weeks   PT LONG TERM GOAL #2   Title The patient will negotiate 4 steps with one handrail x supervision for improved household surface negotiation.   Baseline Target date 09/16/2015   Time 8   Period Weeks   PT LONG TERM GOAL #3   Title The patient will improve gait speed from 0.56 ft/sec to > or equal to 1.3 ft/sec to demo transition from safe "household" ambulator to "limited community" ambulator.   Baseline Target date 09/16/2015   Time 8   Period Weeks   PT LONG TERM GOAL #4   Title The paitent will report knee pain reduced from 8/10 to 4/10 for improved tolerance to mobility.   Baseline Target date 09/16/2015   Time 8   Period Weeks   PT LONG TERM GOAL #5   Title The patient will ambulate x 250 feet nonstop to demo improved tolerance to ambulation.   Baseline Target date 09/16/2015   Time 8   Period Weeks               Plan - 08/18/15 1247    Clinical Impression Statement The patient met all STGs.  She is progressing well with gait and standing tolerance.  She reports no pain with walking in L knee/hip area.  She  does still note pain when stretching L IT band and crepitus noted during standing activities.    PT Treatment/Interventions ADLs/Self Care Home Management;Manual techniques;Therapeutic exercise;Therapeutic activities;Moist Heat;Cryotherapy;Neuromuscular re-education;Gait training;Stair training;DME Instruction;Functional mobility training;Patient/family education   PT Next Visit Plan Low back stretch (towel roll hooklying, pelvic tilts, sit<>stand), gait  with emphasis on posture, IT band stretch L   Consulted and Agree with Plan of Care Patient      Patient will benefit from skilled therapeutic intervention in order to improve the following deficits and impairments:  Abnormal gait, Difficulty walking, Decreased activity tolerance, Decreased balance, Decreased mobility, Decreased strength, Postural dysfunction, Impaired flexibility, Pain, Decreased endurance  Visit Diagnosis: Pain in left knee  Muscle weakness (generalized)  Other abnormalities of gait and mobility       G-Codes - 09-16-15 1120    Functional Assessment Tool Used gait speed=1.35 ft/sec   Functional Limitation Mobility: Walking and moving around   Mobility: Walking and Moving Around Current Status [phone]) At least 40 percent but less than 60 percent impaired, limited or restricted   Mobility: Walking and Moving Around Goal Status [phone]) At least 20 percent but less than 40 percent impaired, limited or restricted      Problem List Patient Active Problem List   Diagnosis Date Noted  . HLD (hyperlipidemia) 11/21/2014  . Depression 11/21/2014  . Nausea & vomiting 11/21/2014  . Sepsis ([place]) 11/21/2014  . Coughing 11/21/2014  . UTI (lower urinary tract infection) 11/21/2014  . AKI (acute kidney injury) ([place]) 11/21/2014  . Nausea with vomiting   . 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 HNP (herniated nucleus pulposus), cervical 10/15/2011    Class: Chronic  . Diabetes mellitus without  complication ([place]) 79/98/7215  . Essential hypertension 09/30/2006  . DILATION AND CURETTAGE, HX OF 09/30/2006    Porsche Noguchi, PT 2015/09/16, 12:48 PM  [facility] [address] [place], [name], [address] Phone: [phone]   Fax:  [phone]  Name: Jodi Chavez MRN: [id number] Date of Birth: 1950/08/01

## 2015-08-25 ENCOUNTER — Ambulatory Visit: Payer: Medicare Other | Admitting: Rehabilitative and Restorative Service Providers"

## 2015-08-25 DIAGNOSIS — M25562 Pain in left knee: Secondary | ICD-10-CM

## 2015-08-25 DIAGNOSIS — M6281 Muscle weakness (generalized): Secondary | ICD-10-CM

## 2015-08-25 DIAGNOSIS — R2689 Other abnormalities of gait and mobility: Secondary | ICD-10-CM

## 2015-08-25 NOTE — Therapy (Signed)
[facility] [address] [place], [name], [address] Phone: [phone]   Fax:  [phone]  Physical Therapy Treatment  Patient Details  Name: Jodi Chavez MRN: [id number] Date of Birth: 1950/08/21 Referring Provider: Nolene Ebbs, MD  Encounter Date: 08/25/2015      PT End of Session - 08/25/15 1113    Visit Number 11   Number of Visits 16   Date for PT Re-Evaluation 09/17/15   Authorization Type G code every 10th visit   PT Start Time 1112   PT Stop Time 1150   PT Time Calculation (min) 38 min   Equipment Utilized During Treatment Gait belt   Activity Tolerance Patient tolerated treatment well   Behavior During Therapy [facility] for tasks assessed/performed      Past Medical History  Diagnosis Date  . PONV (postoperative nausea and vomiting)   . Hypertension     takes Benazepril nightly and Propranolol tid and Clonidine daily  . Hyperlipidemia     takes Zetia and Zocor daily  . History of bronchitis     last time several yrs ago  . History of migraine     many yrs ago  . Dizziness     occasionally and related to meds   . Peripheral edema   . Peripheral neuropathy ([place])   . Arthritis     hip  . Low back pain   . Constipation     uses laxatives several times a week  . Slow urinary stream     occasionally  . Diabetes mellitus     takes Metformin and Amaryl daily  . Early cataracts, bilateral   . Depression   . Insomnia     Past Surgical History  Procedure Laterality Date  . 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 D&c/hysteroscopy/ablation    . Dilation and curettage of uterus      couple of times  . Colonoscopy    . Esophagogastroduodenoscopy    . Posterior cervical fusion/foraminotomy  10/15/2011    Procedure: POSTERIOR CERVICAL FUSION/FORAMINOTOMY LEVEL 2;  Surgeon: Jessy Oto, MD;  Location: [facility];  Service: Orthopedics;  Laterality: N/A;  Right C6-7, C7-T1  Foraminotomy with excision HNP C7-T1  . Upper gastrointestinal endoscopy      There were no vitals filed for this visit.      Subjective Assessment - 08/25/15 1109    Subjective The patient is running late today and had difficulty walking in from parking lot.   Patient Stated Goals Manage pain, exercises for home for strengthening.   Currently in Pain? Yes   Pain Score 3    Pain Location Knee   Pain Orientation Left   Pain Descriptors / Indicators Aching   Pain Type Chronic pain   Pain Onset More than a month ago   Pain Frequency Intermittent   Aggravating Factors  walking into clinic fast   Pain Relieving Factors medications                   [facility] Adult PT Treatment/Exercise - 08/25/15 1122    Ambulation/Gait   Ambulation/Gait Yes   Ambulation/Gait Assistance 5: Supervision   Ambulation Distance (Feet) 120 Feet  rest, 150 feet, 115 feet, 100 feet; SPC x 20 feet x 2 reps.   Assistive device Rolling walker   Ambulation Surface Level   Gait Comments PT cues to decrease UE support through RW and to maintain upright posture.  Knee/Hip Exercises: Standing   Other Standing Knee Exercises Sit<>stand x 5 repetitions dec'ing UE support on walker.   Knee/Hip Exercises: Supine   Other Supine Knee/Hip Exercises HIp ab/adduction in hooklying x 10 reps with tactile cues, pelvic tilts x 10 reps, supine marching x 10 with tactile cues and intermittent assist.        THERAPEUTIC EXERCISE: Seated postural strengthening including rows with arms at neutral, and arms at 90 degrees with shoulders abducted to 90 degrees 10 reps of each exercise.       PT Short Term Goals - 08/18/15 1120    PT SHORT TERM GOAL #1   Title The patient will be indep with HEP using written insturctions for cues.   Baseline Met per subjective reports on 08/18/2015   Time 4   Period Weeks   Status Achieved   PT SHORT TERM GOAL #2   Title The patient will improve sit>stand transfers to be independent  moving sit>RW x 5/5 repetitions with UE support and no assist or loss of balance.   Baseline Met on 08/12/2015   Time 4   Period Weeks   Status Achieved   PT SHORT TERM GOAL #3   Title The patient will improve walking speed from 0.56 ft/sec to > or equal to 1.0 ft/sec to demo improving functional ambulation.   Baseline Improved to 08/18/2015 to 1.35 ft/sec with Rw.   Time 4   Period Weeks   Status Achieved   PT SHORT TERM GOAL #4   Title The patient will move sit<>supine with supervision to demo improved functional mobility.   Baseline Patient needs supervision and verbal cues to perform and coaching on technique.   Time 4   Period Weeks   Status Achieved           PT Long Term Goals - 07/19/15 1156    PT LONG TERM GOAL #1   Title The patient will be indep with HEP progression.   Baseline Target date 09/16/2015   Time 8   Period Weeks   PT LONG TERM GOAL #2   Title The patient will negotiate 4 steps with one handrail x supervision for improved household surface negotiation.   Baseline Target date 09/16/2015   Time 8   Period Weeks   PT LONG TERM GOAL #3   Title The patient will improve gait speed from 0.56 ft/sec to > or equal to 1.3 ft/sec to demo transition from safe "household" ambulator to "limited community" ambulator.   Baseline Target date 09/16/2015   Time 8   Period Weeks   PT LONG TERM GOAL #4   Title The paitent will report knee pain reduced from 8/10 to 4/10 for improved tolerance to mobility.   Baseline Target date 09/16/2015   Time 8   Period Weeks   PT LONG TERM GOAL #5   Title The patient will ambulate x 250 feet nonstop to demo improved tolerance to ambulation.   Baseline Target date 09/16/2015   Time 8   Period Weeks               Plan - 08/25/15 2009    Clinical Impression Statement The patient had less tolerance to activity today and required more frequent rest breaks, which appeared to be due to feeling overheated after rushing into therapy  from running late.     PT Treatment/Interventions ADLs/Self Care Home Management;Manual techniques;Therapeutic exercise;Therapeutic activities;Moist Heat;Cryotherapy;Neuromuscular re-education;Gait training;Stair training;DME Instruction;Functional mobility training;Patient/family education   PT Next Visit  Plan Begin working to [employer] including stairs, further strengthening, IT band stretching, push gait distance.   Consulted and Agree with Plan of Care Patient      Patient will benefit from skilled therapeutic intervention in order to improve the following deficits and impairments:  Abnormal gait, Difficulty walking, Decreased activity tolerance, Decreased balance, Decreased mobility, Decreased strength, Postural dysfunction, Impaired flexibility, Pain, Decreased endurance  Visit Diagnosis: Pain in left knee  Muscle weakness (generalized)  Other abnormalities of gait and mobility     Problem List Patient Active Problem List   Diagnosis Date Noted  . HLD (hyperlipidemia) 11/21/2014  . Depression 11/21/2014  . Nausea & vomiting 11/21/2014  . Sepsis ([place]) 11/21/2014  . Coughing 11/21/2014  . UTI (lower urinary tract infection) 11/21/2014  . AKI (acute kidney injury) ([place]) 11/21/2014  . Nausea with vomiting   . 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 HNP (herniated nucleus pulposus), cervical 10/15/2011    Class: Chronic  . Diabetes mellitus without complication ([place]) 83/66/2947  . Essential hypertension 09/30/2006  . DILATION AND CURETTAGE, HX OF 09/30/2006    Angeleigh Chiasson, PT 08/25/2015, 8:10 PM  [facility] [address] [place], [name], [address] Phone: [phone]   Fax:  [phone]  Name: Jodi Chavez MRN: [id number] Date of Birth: [birth year]

## 2015-08-26 ENCOUNTER — Ambulatory Visit: Payer: Medicare Other | Admitting: Rehabilitative and Restorative Service Providers"

## 2015-08-26 DIAGNOSIS — R2689 Other abnormalities of gait and mobility: Secondary | ICD-10-CM | POA: Diagnosis not present

## 2015-08-26 DIAGNOSIS — M6281 Muscle weakness (generalized): Secondary | ICD-10-CM

## 2015-08-26 DIAGNOSIS — M25562 Pain in left knee: Secondary | ICD-10-CM

## 2015-08-26 NOTE — Therapy (Signed)
[facility] [address] [place], [name], [address] Phone: [phone]   Fax:  [phone]  Physical Therapy Treatment  Patient Details  Name: Jodi Chavez MRN: [id number] Date of Birth: 1951-01-20 Referring Provider: Nolene Ebbs, MD  Encounter Date: 08/26/2015      PT End of Session - 08/26/15 1418    Visit Number 12   Number of Visits 16   Date for PT Re-Evaluation 09/17/15   Authorization Type G code every 10th visit   PT Start Time 1413   PT Stop Time 1451   PT Time Calculation (min) 38 min   Equipment Utilized During Treatment Gait belt   Activity Tolerance Patient tolerated treatment well   Behavior During Therapy [facility] for tasks assessed/performed      Past Medical History  Diagnosis Date  . PONV (postoperative nausea and vomiting)   . Hypertension     takes Benazepril nightly and Propranolol tid and Clonidine daily  . Hyperlipidemia     takes Zetia and Zocor daily  . History of bronchitis     last time several yrs ago  . History of migraine     many yrs ago  . Dizziness     occasionally and related to meds   . Peripheral edema   . Peripheral neuropathy ([place])   . Arthritis     hip  . Low back pain   . Constipation     uses laxatives several times a week  . Slow urinary stream     occasionally  . Diabetes mellitus     takes Metformin and Amaryl daily  . Early cataracts, bilateral   . Depression   . Insomnia     Past Surgical History  Procedure Laterality Date  . 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 D&c/hysteroscopy/ablation    . Dilation and curettage of uterus      couple of times  . Colonoscopy    . Esophagogastroduodenoscopy    . Posterior cervical fusion/foraminotomy  10/15/2011    Procedure: POSTERIOR CERVICAL FUSION/FORAMINOTOMY LEVEL 2;  Surgeon: Jessy Oto, MD;  Location: [facility];  Service: Orthopedics;  Laterality: N/A;  Right C6-7, C7-T1  Foraminotomy with excision HNP C7-T1  . Upper gastrointestinal endoscopy      There were no vitals filed for this visit.      Subjective Assessment - 08/26/15 1426    Subjective The patient reports that she has been sitting in the lobby so she didn't get overheated before therapy today.   Patient Stated Goals Manage pain, exercises for home for strengthening.   Currently in Pain? Yes   Pain Score 3    Pain Location Knee   Pain Orientation Left   Pain Descriptors / Indicators Aching   Pain Type Chronic pain   Pain Onset More than a month ago   Pain Frequency Intermittent   Aggravating Factors  varies   Pain Relieving Factors medications      Gait: Ambulation with RW x 150 feet, 115 feet x 2 reps, and 230 feet with cues on dec'ing UE support and increased weight through LEs Stair negotiation with bilateral handrails and step to pattern x 4 steps with CGA to SBA.  Patient leads with left leg ascending and descending  THERAPEUTIC EXERCISE: Sit<>stand x 10 reps Standing hip flexion/extension leaning forward towards countertop Standing trunk rotation in standing opening to R and then L while contralateral UE is maintaining support  through countertop Seated sci-fit stepper x 6 minutes nonstop working on increasing ROM and emphasizing speed of movement of LEs        PT Short Term Goals - 08/18/15 1120    PT SHORT TERM GOAL #1   Title The patient will be indep with HEP using written insturctions for cues.   Baseline Met per subjective reports on 08/18/2015   Time 4   Period Weeks   Status Achieved   PT SHORT TERM GOAL #2   Title The patient will improve sit>stand transfers to be independent moving sit>RW x 5/5 repetitions with UE support and no assist or loss of balance.   Baseline Met on 08/12/2015   Time 4   Period Weeks   Status Achieved   PT SHORT TERM GOAL #3   Title The patient will improve walking speed from 0.56 ft/sec to > or equal to 1.0 ft/sec to demo improving  functional ambulation.   Baseline Improved to 08/18/2015 to 1.35 ft/sec with Rw.   Time 4   Period Weeks   Status Achieved   PT SHORT TERM GOAL #4   Title The patient will move sit<>supine with supervision to demo improved functional mobility.   Baseline Patient needs supervision and verbal cues to perform and coaching on technique.   Time 4   Period Weeks   Status Achieved           PT Long Term Goals - 07/19/15 1156    PT LONG TERM GOAL #1   Title The patient will be indep with HEP progression.   Baseline Target date 09/16/2015   Time 8   Period Weeks   PT LONG TERM GOAL #2   Title The patient will negotiate 4 steps with one handrail x supervision for improved household surface negotiation.   Baseline Target date 09/16/2015   Time 8   Period Weeks   PT LONG TERM GOAL #3   Title The patient will improve gait speed from 0.56 ft/sec to > or equal to 1.3 ft/sec to demo transition from safe "household" ambulator to "limited community" ambulator.   Baseline Target date 09/16/2015   Time 8   Period Weeks   PT LONG TERM GOAL #4   Title The paitent will report knee pain reduced from 8/10 to 4/10 for improved tolerance to mobility.   Baseline Target date 09/16/2015   Time 8   Period Weeks   PT LONG TERM GOAL #5   Title The patient will ambulate x 250 feet nonstop to demo improved tolerance to ambulation.   Baseline Target date 09/16/2015   Time 8   Period Weeks               Plan - 08/26/15 1448    Clinical Impression Statement The patient was able to walk further distances today with encouragement to decrease UE weight bearing.  The patient is working towards [place].   PT Treatment/Interventions ADLs/Self Care Home Management;Manual techniques;Therapeutic exercise;Therapeutic activities;Moist Heat;Cryotherapy;Neuromuscular re-education;Gait training;Stair training;DME Instruction;Functional mobility training;Patient/family education   PT Next Visit Plan Work to LTGs, staris,  further distance walking, IT band stretching.   Consulted and Agree with Plan of Care Patient      Patient will benefit from skilled therapeutic intervention in order to improve the following deficits and impairments:  Abnormal gait, Difficulty walking, Decreased activity tolerance, Decreased balance, Decreased mobility, Decreased strength, Postural dysfunction, Impaired flexibility, Pain, Decreased endurance  Visit Diagnosis: Pain in left knee  Muscle weakness (generalized)  Other abnormalities of gait and mobility     Problem List Patient Active Problem List   Diagnosis Date Noted  . HLD (hyperlipidemia) 11/21/2014  . Depression 11/21/2014  . Nausea & vomiting 11/21/2014  . Sepsis ([place]) 11/21/2014  . Coughing 11/21/2014  . UTI (lower urinary tract infection) 11/21/2014  . AKI (acute kidney injury) ([place]) 11/21/2014  . Nausea with vomiting   . 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 HNP (herniated nucleus pulposus), cervical 10/15/2011    Class: Chronic  . Diabetes mellitus without complication ([place]) 49/96/9249  . Essential hypertension 09/30/2006  . DILATION AND CURETTAGE, HX OF 09/30/2006    Markee Remlinger , PT  08/26/2015, 2:49 PM  [facility] [address] [place], [name], [address] Phone: [phone]   Fax:  [phone]  Name: JAYLIANNA TATLOCK MRN: [id number] Date of Birth: 1950-05-22

## 2015-08-31 ENCOUNTER — Ambulatory Visit: Payer: Medicare Other | Attending: Internal Medicine | Admitting: Rehabilitative and Restorative Service Providers"

## 2015-08-31 DIAGNOSIS — M25562 Pain in left knee: Secondary | ICD-10-CM

## 2015-08-31 DIAGNOSIS — R2689 Other abnormalities of gait and mobility: Secondary | ICD-10-CM | POA: Diagnosis present

## 2015-08-31 DIAGNOSIS — M6281 Muscle weakness (generalized): Secondary | ICD-10-CM | POA: Diagnosis present

## 2015-08-31 DIAGNOSIS — M25552 Pain in left hip: Secondary | ICD-10-CM | POA: Insufficient documentation

## 2015-08-31 NOTE — Therapy (Signed)
[facility] [address] [place], [name], [address] Phone: [phone]   Fax:  [phone]  Physical Therapy Treatment  Patient Details  Name: Jodi Chavez MRN: [id number] Date of Birth: [birth year] Referring Provider: Nolene Ebbs, MD  Encounter Date: 08/31/2015      PT End of Session - 08/31/15 1304    Visit Number 13   Number of Visits 16   Date for PT Re-Evaluation 09/17/15   Authorization Type G code every 10th visit   PT Start Time 1110   PT Stop Time 1150   PT Time Calculation (min) 40 min   Equipment Utilized During Treatment Gait belt   Activity Tolerance Patient tolerated treatment well   Behavior During Therapy [facility] for tasks assessed/performed      Past Medical History  Diagnosis Date  . PONV (postoperative nausea and vomiting)   . Hypertension     takes Benazepril nightly and Propranolol tid and Clonidine daily  . Hyperlipidemia     takes Zetia and Zocor daily  . History of bronchitis     last time several yrs ago  . History of migraine     many yrs ago  . Dizziness     occasionally and related to meds   . Peripheral edema   . Peripheral neuropathy ([place])   . Arthritis     hip  . Low back pain   . Constipation     uses laxatives several times a week  . Slow urinary stream     occasionally  . Diabetes mellitus     takes Metformin and Amaryl daily  . Early cataracts, bilateral   . Depression   . Insomnia     Past Surgical History  Procedure Laterality Date  . 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 D&c/hysteroscopy/ablation    . Dilation and curettage of uterus      couple of times  . Colonoscopy    . Esophagogastroduodenoscopy    . Posterior cervical fusion/foraminotomy  10/15/2011    Procedure: POSTERIOR CERVICAL FUSION/FORAMINOTOMY LEVEL 2;  Surgeon: Jessy Oto, MD;  Location: [facility];  Service: Orthopedics;  Laterality: N/A;  Right C6-7, C7-T1  Foraminotomy with excision HNP C7-T1  . Upper gastrointestinal endoscopy      There were no vitals filed for this visit.      Subjective Assessment - 08/31/15 1113    Subjective The patient reports she was running late today and had to hurry to get in to the clinic.   Patient Stated Goals Manage pain, exercises for home for strengthening.   Currently in Pain? Yes   Pain Score 0-No pain  none at rest, can be painful with various movements   Pain Location Knee   Pain Orientation Left   Pain Descriptors / Indicators Aching   Pain Type Chronic pain   Pain Onset More than a month ago   Pain Frequency Intermittent   Aggravating Factors  varies   Pain Relieving Factors medications                         [facility] Adult PT Treatment/Exercise - 08/31/15 1115    Ambulation/Gait   Ambulation/Gait Yes   Ambulation/Gait Assistance 6: Modified independent (Device/Increase time)   Ambulation Distance (Feet) 150 Feet  230 feet, 40 feet x 2 with SPC and CGA and 20 ft with [facility]   Assistive device Rolling walker  Ambulation Surface Level   Gait Comments PT cues to decrease UE weight bearing and cues for upright posture during gait with SPC.  SPC non-functional due to dec'd safety/ need for walker, however using to decrease UE support through device.     Neuro Re-ed    Neuro Re-ed Details  Standing postural cues without UE support in parallel bars, standing sidestepping emphasizing dec'd dependence on UEs for support.     Exercises   Exercises Other Exercises   Other Exercises  Supine lumbar exercises supine with towel roll for pelvic tilt facilitation and rocking R/L with LEs supported on ball, moving physioball into/away from hips and gentle rolling for lumbar stretching.     Knee/Hip Exercises: Stretches   Other Knee/Hip Stretches hip flexor stretch in right sidelying for L hip flexors.                   PT Short Term Goals - 08/18/15 1120    PT SHORT TERM GOAL #1    Title The patient will be indep with HEP using written insturctions for cues.   Baseline Met per subjective reports on 08/18/2015   Time 4   Period Weeks   Status Achieved   PT SHORT TERM GOAL #2   Title The patient will improve sit>stand transfers to be independent moving sit>RW x 5/5 repetitions with UE support and no assist or loss of balance.   Baseline Met on 08/12/2015   Time 4   Period Weeks   Status Achieved   PT SHORT TERM GOAL #3   Title The patient will improve walking speed from 0.56 ft/sec to > or equal to 1.0 ft/sec to demo improving functional ambulation.   Baseline Improved to 08/18/2015 to 1.35 ft/sec with Rw.   Time 4   Period Weeks   Status Achieved   PT SHORT TERM GOAL #4   Title The patient will move sit<>supine with supervision to demo improved functional mobility.   Baseline Patient needs supervision and verbal cues to perform and coaching on technique.   Time 4   Period Weeks   Status Achieved           PT Long Term Goals - 07/19/15 1156    PT LONG TERM GOAL #1   Title The patient will be indep with HEP progression.   Baseline Target date 09/16/2015   Time 8   Period Weeks   PT LONG TERM GOAL #2   Title The patient will negotiate 4 steps with one handrail x supervision for improved household surface negotiation.   Baseline Target date 09/16/2015   Time 8   Period Weeks   PT LONG TERM GOAL #3   Title The patient will improve gait speed from 0.56 ft/sec to > or equal to 1.3 ft/sec to demo transition from safe "household" ambulator to "limited community" ambulator.   Baseline Target date 09/16/2015   Time 8   Period Weeks   PT LONG TERM GOAL #4   Title The paitent will report knee pain reduced from 8/10 to 4/10 for improved tolerance to mobility.   Baseline Target date 09/16/2015   Time 8   Period Weeks   PT LONG TERM GOAL #5   Title The patient will ambulate x 250 feet nonstop to demo improved tolerance to ambulation.   Baseline Target date  09/16/2015   Time 8   Period Weeks               Plan -  08/31/15 1306    Clinical Impression Statement The patient is limited in upright posture by low back discomfort and habit of utilizing RW with significant trunk flexion and increased UE support.  PT emphasizing multifactorial nature of postural flexion to improve upright posture for gait activities.    PT Treatment/Interventions ADLs/Self Care Home Management;Manual techniques;Therapeutic exercise;Therapeutic activities;Moist Heat;Cryotherapy;Neuromuscular re-education;Gait training;Stair training;DME Instruction;Functional mobility training;Patient/family education   PT Next Visit Plan Work to LTGs, progress ambulation, IT band stretching.   Consulted and Agree with Plan of Care Patient      Patient will benefit from skilled therapeutic intervention in order to improve the following deficits and impairments:  Abnormal gait, Difficulty walking, Decreased activity tolerance, Decreased balance, Decreased mobility, Decreased strength, Postural dysfunction, Impaired flexibility, Pain, Decreased endurance  Visit Diagnosis: Pain in left knee  Muscle weakness (generalized)  Other abnormalities of gait and mobility  Pain in left hip     Problem List Patient Active Problem List   Diagnosis Date Noted  . HLD (hyperlipidemia) 11/21/2014  . Depression 11/21/2014  . Nausea & vomiting 11/21/2014  . Sepsis ([place]) 11/21/2014  . Coughing 11/21/2014  . UTI (lower urinary tract infection) 11/21/2014  . AKI (acute kidney injury) ([place]) 11/21/2014  . Nausea with vomiting   . 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 HNP (herniated nucleus pulposus), cervical 10/15/2011    Class: Chronic  . Diabetes mellitus without complication ([place]) 20/25/4270  . Essential hypertension 09/30/2006  . DILATION AND CURETTAGE, HX OF 09/30/2006    Tinika Bucknam, PT 08/31/2015, 1:12 PM  [facility] [address] [place], [name], [address] Phone: [phone]   Fax:  [phone]  Name: SHEVY YANEY MRN: [id number] Date of Birth: August 26, 1950

## 2015-09-01 ENCOUNTER — Ambulatory Visit: Payer: Medicare Other | Admitting: Dietician

## 2015-09-02 ENCOUNTER — Ambulatory Visit: Payer: Medicare Other | Admitting: Rehabilitative and Restorative Service Providers"

## 2015-09-02 DIAGNOSIS — M25562 Pain in left knee: Secondary | ICD-10-CM | POA: Diagnosis not present

## 2015-09-02 DIAGNOSIS — R2689 Other abnormalities of gait and mobility: Secondary | ICD-10-CM

## 2015-09-02 DIAGNOSIS — M6281 Muscle weakness (generalized): Secondary | ICD-10-CM

## 2015-09-02 DIAGNOSIS — M25552 Pain in left hip: Secondary | ICD-10-CM

## 2015-09-02 NOTE — Therapy (Signed)
Jodi Chavez [address] [name], [name], [address] Phone: [phone]   Fax:  [phone]  Physical Therapy Treatment  Patient Details  Name: Jodi Chavez MRN: [id number] Date of Birth: 14-Jul-1950 Referring Provider: Nolene Ebbs, MD  Encounter Date: 09/02/2015      PT End of Session - 09/02/15 1338    Visit Number 14   Number of Visits 16   Date for PT Re-Evaluation 09/17/15   Authorization Type G code every 10th visit   PT Start Time 1105   PT Stop Time 1148   PT Time Calculation (min) 43 min   Equipment Utilized During Treatment Gait belt   Activity Tolerance Patient tolerated treatment well   Behavior During Therapy [facility] for tasks assessed/performed      Past Medical History  Diagnosis Date  . PONV (postoperative nausea and vomiting)   . Hypertension     takes Benazepril nightly and Propranolol tid and Clonidine daily  . Hyperlipidemia     takes Zetia and Zocor daily  . History of bronchitis     last time several yrs ago  . History of migraine     many yrs ago  . Dizziness     occasionally and related to meds   . Peripheral edema   . Peripheral neuropathy ([place])   . Arthritis     hip  . Low back pain   . Constipation     uses laxatives several times a week  . Slow urinary stream     occasionally  . Diabetes mellitus     takes Metformin and Amaryl daily  . Early cataracts, bilateral   . Depression   . Insomnia     Past Surgical History  Procedure Laterality Date  . 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 D&c/hysteroscopy/ablation    . Dilation and curettage of uterus      couple of times  . Colonoscopy    . Esophagogastroduodenoscopy    . Posterior cervical fusion/foraminotomy  10/15/2011    Procedure: POSTERIOR CERVICAL FUSION/FORAMINOTOMY LEVEL 2;  Surgeon: Jessy Oto, MD;  Location: [place];  Service: Orthopedics;  Laterality: N/A;  Right C6-7, C7-T1  Foraminotomy with excision HNP C7-T1  . Upper gastrointestinal endoscopy      There were no vitals filed for this visit.      Subjective Assessment - 09/02/15 1112    Subjective The patient reports she was running late, but got a close parking spot.   Patient Stated Goals Manage pain, exercises for home for strengthening.   Currently in Pain? Yes   Pain Score 4   during activities, none at rest   Pain Location Knee   Pain Orientation Left   Pain Descriptors / Indicators Sore   Pain Type Chronic pain   Pain Onset More than a month ago   Pain Frequency Intermittent   Aggravating Factors  hurts with bed mobility   Pain Relieving Factors rest and walking no longer aggravate pain; medications help               [facility] Adult PT Treatment/Exercise - 09/02/15 1118    Bed Mobility   Bed Mobility Supine to Sit;Sit to Supine   Supine to Sit 5: Supervision   Sit to Supine 5: Supervision   Transfers   Transfers Sit to Stand;Stand to Sit   Sit to Stand 6: Modified independent (Device/Increase time)   Stand to Sit 6:  Modified independent (Device/Increase time)   Ambulation/Gait   Ambulation/Gait Yes   Ambulation/Gait Assistance 6: Modified independent (Device/Increase time)   Ambulation Distance (Feet) 100 Feet   Assistive device Rolling walker   Gait Comments Patient walked 250 feet with RW nonstop demonstrating imrpoved endurance.  She also ambulated 40 feet with SPC and HHA of 1 person with cues on upright posture.    Knee/Hip Exercises: Supine   Other Supine Knee/Hip Exercises Supine marching 3 reps x 3 sets with assistance and cues for target to encourage lifting.     Other Supine Knee/Hip Exercises Supine hip ab/adduction and attempted SLR x 5 reps with assistance on L LE.    Knee/Hip Exercises: Sidelying   Hip ABduction 5 reps;2 sets  with assistance due to difficulty lifting.   Other Sidelying Knee/Hip Exercises sidelying clam shells x 5 reps x 2 sets with verbal and  demonstration cues.   Other Sidelying Knee/Hip Exercises IT band stretch in sidelying to patient tolerance.     Manual Therapy   Manual Therapy Joint mobilization   Joint Mobilization Patellar mobilization to the medial direction.                   PT Short Term Goals - 08/18/15 1120    PT SHORT TERM GOAL #1   Title The patient will be indep with HEP using written insturctions for cues.   Baseline Met per subjective reports on 08/18/2015   Time 4   Period Weeks   Status Achieved   PT SHORT TERM GOAL #2   Title The patient will improve sit>stand transfers to be independent moving sit>RW x 5/5 repetitions with UE support and no assist or loss of balance.   Baseline Met on 08/12/2015   Time 4   Period Weeks   Status Achieved   PT SHORT TERM GOAL #3   Title The patient will improve walking speed from 0.56 ft/sec to > or equal to 1.0 ft/sec to demo improving functional ambulation.   Baseline Improved to 08/18/2015 to 1.35 ft/sec with Rw.   Time 4   Period Weeks   Status Achieved   PT SHORT TERM GOAL #4   Title The patient will move sit<>supine with supervision to demo improved functional mobility.   Baseline Patient needs supervision and verbal cues to perform and coaching on technique.   Time 4   Period Weeks   Status Achieved           PT Long Term Goals - 09/02/15 1338    PT LONG TERM GOAL #1   Title The patient will be indep with HEP progression.   Baseline Target date 09/16/2015   Time 8   Period Weeks   Status On-going   PT LONG TERM GOAL #2   Title The patient will negotiate 4 steps with one handrail x supervision for improved household surface negotiation.   Baseline Target date 09/16/2015   Time 8   Period Weeks   Status On-going   PT LONG TERM GOAL #3   Title The patient will improve gait speed from 0.56 ft/sec to > or equal to 1.3 ft/sec to demo transition from safe "household" ambulator to "limited community" ambulator.   Baseline Target date  09/16/2015   Time 8   Period Weeks   Status On-going   PT LONG TERM GOAL #4   Title The paitent will report knee pain reduced from 8/10 to 4/10 for improved tolerance to mobility.   Baseline Patient  consistently reports 4/10 knee pain as max pain in therapy.     Time 8   Period Weeks   Status Achieved   PT LONG TERM GOAL #5   Title The patient will ambulate x 250 feet nonstop to demo improved tolerance to ambulation.   Baseline The patient met on 09/02/2015   Time 8   Period Weeks   Status Achieved               Plan - 09/02/15 1339    Clinical Impression Statement The patient has met 2 LTGs.  She continues to make progress with overall functional mobility.  Recommend continue to remaining LTGs--anticipate renewal for continued therapy as she continues to make progress and benefit from skilled intervention.   PT Treatment/Interventions ADLs/Self Care Home Management;Manual techniques;Therapeutic exercise;Therapeutic activities;Moist Heat;Cryotherapy;Neuromuscular re-education;Gait training;Stair training;DME Instruction;Functional mobility training;Patient/family education   PT Next Visit Plan Work to LTGs, progress ambulation, IT band stretching.   Consulted and Agree with Plan of Care Patient      Patient will benefit from skilled therapeutic intervention in order to improve the following deficits and impairments:  Abnormal gait, Difficulty walking, Decreased activity tolerance, Decreased balance, Decreased mobility, Decreased strength, Postural dysfunction, Impaired flexibility, Pain, Decreased endurance  Visit Diagnosis: Pain in left knee  Muscle weakness (generalized)  Other abnormalities of gait and mobility  Pain in left hip     Problem List Patient Active Problem List   Diagnosis Date Noted  . HLD (hyperlipidemia) 11/21/2014  . Depression 11/21/2014  . Nausea & vomiting 11/21/2014  . Sepsis ([place]) 11/21/2014  . Coughing 11/21/2014  . UTI (lower urinary  tract infection) 11/21/2014  . AKI (acute kidney injury) ([place]) 11/21/2014  . Nausea with vomiting   . 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 HNP (herniated nucleus pulposus), cervical 10/15/2011    Class: Chronic  . Diabetes mellitus without complication ([place]) 61/16/4353  . Essential hypertension 09/30/2006  . DILATION AND CURETTAGE, HX OF 09/30/2006    Ronelle Smallman , PT  09/02/2015, 1:41 PM  [facility] [address] [place], [name], [address] Phone: [phone]   Fax:  [phone]  Name: RAFFAELA LADLEY MRN: [id number] Date of Birth: 09/24/1950

## 2015-09-07 ENCOUNTER — Ambulatory Visit: Payer: Medicare Other | Admitting: Rehabilitative and Restorative Service Providers"

## 2015-09-07 DIAGNOSIS — M25562 Pain in left knee: Secondary | ICD-10-CM | POA: Diagnosis not present

## 2015-09-07 DIAGNOSIS — M25552 Pain in left hip: Secondary | ICD-10-CM

## 2015-09-07 DIAGNOSIS — M6281 Muscle weakness (generalized): Secondary | ICD-10-CM

## 2015-09-07 DIAGNOSIS — R2689 Other abnormalities of gait and mobility: Secondary | ICD-10-CM

## 2015-09-07 NOTE — Therapy (Signed)
[facility] [address] [place], [name], [address] Phone: [phone]   Fax:  [phone]  Physical Therapy Treatment  Patient Details  Name: Jodi Chavez MRN: [id number] Date of Birth: 1950/11/20 Referring Provider: Nolene Ebbs, MD  Encounter Date: 09/07/2015      PT End of Session - 09/07/15 1726    Visit Number 15   Number of Visits 16   Date for PT Re-Evaluation 09/17/15   Authorization Type G code every 10th visit   PT Start Time 1240   PT Stop Time 1320   PT Time Calculation (min) 40 min   Equipment Utilized During Treatment Gait belt   Activity Tolerance Patient tolerated treatment well   Behavior During Therapy [facility] for tasks assessed/performed      Past Medical History  Diagnosis Date  . PONV (postoperative nausea and vomiting)   . Hypertension     takes Benazepril nightly and Propranolol tid and Clonidine daily  . Hyperlipidemia     takes Zetia and Zocor daily  . History of bronchitis     last time several yrs ago  . History of migraine     many yrs ago  . Dizziness     occasionally and related to meds   . Peripheral edema   . Peripheral neuropathy ([place])   . Arthritis     hip  . Low back pain   . Constipation     uses laxatives several times a week  . Slow urinary stream     occasionally  . Diabetes mellitus     takes Metformin and Amaryl daily  . Early cataracts, bilateral   . Depression   . Insomnia     Past Surgical History  Procedure Laterality Date  . 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 D&c/hysteroscopy/ablation    . Dilation and curettage of uterus      couple of times  . Colonoscopy    . Esophagogastroduodenoscopy    . Posterior cervical fusion/foraminotomy  10/15/2011    Procedure: POSTERIOR CERVICAL FUSION/FORAMINOTOMY LEVEL 2;  Surgeon: Jessy Oto, MD;  Location: [place];  Service: Orthopedics;  Laterality: N/A;  Right C6-7, C7-T1  Foraminotomy with excision HNP C7-T1  . Upper gastrointestinal endoscopy      There were no vitals filed for this visit.      Subjective Assessment - 09/07/15 1249    Subjective The patient reports she has been walking more going to a meeting at church.    Patient Stated Goals Manage pain, exercises for home for strengthening.   Currently in Pain? No/denies   Pain Score --  increases with movement   Pain Location Knee   Pain Orientation Left   Pain Descriptors / Indicators Sore   Pain Type Chronic pain   Pain Onset More than a month ago   Pain Frequency Intermittent   Aggravating Factors  hurts with exercise   Pain Relieving Factors rest and walking      THERAPEUTIC EXERCISE: Hip adductor strengthening with ball squeeze x 8 reps.   Step up to 4" step R/L sides x 5 reps each Sit<>stand emphasizing upright posture  SELF CARE/HOME MANAGEMENT: Discussed home activities to increase independence and working on standing for longer times in order to return to meal preparation.  She reports she will not be able to return to her sister's home where she is currently living after a potential future orthopedic surgery.  PT  recommended she work on returning to independent living situation prior to surgery if that is the plan b/c it will be more challenging after surgery to live independently.  PT and patient to further discuss.  NEUROMUSCULAR RE-EDUCATION: Standing side-stepping with UE support x 8 feet in parallel bars with UE support Wall bumps for hip extension and gluteal set/balance control.  Gait: Ambulation with single point cane  X 40 feet, 50 feet with HHA/min A of 1 with cues on upright posture.  Patient c/o low back discomfort with upright standing. Stair negotiation x 4 steps with step to pattern and min A using one rail and SPC      [facility] PT Assessment - 09/07/15 1319    Ambulation/Gait   Ambulation/Gait Yes   Ambulation/Gait Assistance 6: Modified independent  (Device/Increase time)   Ambulation Distance (Feet) 115 Feet  45, 40, 115 x 2   Assistive device Rolling walker   Gait velocity 1.22 ft/sec           PT Short Term Goals - 08/18/15 1120    PT SHORT TERM GOAL #1   Title The patient will be indep with HEP using written insturctions for cues.   Baseline Met per subjective reports on 08/18/2015   Time 4   Period Weeks   Status Achieved   PT SHORT TERM GOAL #2   Title The patient will improve sit>stand transfers to be independent moving sit>RW x 5/5 repetitions with UE support and no assist or loss of balance.   Baseline Met on 08/12/2015   Time 4   Period Weeks   Status Achieved   PT SHORT TERM GOAL #3   Title The patient will improve walking speed from 0.56 ft/sec to > or equal to 1.0 ft/sec to demo improving functional ambulation.   Baseline Improved to 08/18/2015 to 1.35 ft/sec with Rw.   Time 4   Period Weeks   Status Achieved   PT SHORT TERM GOAL #4   Title The patient will move sit<>supine with supervision to demo improved functional mobility.   Baseline Patient needs supervision and verbal cues to perform and coaching on technique.   Time 4   Period Weeks   Status Achieved           PT Long Term Goals - 09/02/15 1338    PT LONG TERM GOAL #1   Title The patient will be indep with HEP progression.   Baseline Target date 09/16/2015   Time 8   Period Weeks   Status On-going   PT LONG TERM GOAL #2   Title The patient will negotiate 4 steps with one handrail x supervision for improved household surface negotiation.   Baseline Target date 09/16/2015   Time 8   Period Weeks   Status On-going   PT LONG TERM GOAL #3   Title The patient will improve gait speed from 0.56 ft/sec to > or equal to 1.3 ft/sec to demo transition from safe "household" ambulator to "limited community" ambulator.   Baseline Target date 09/16/2015   Time 8   Period Weeks   Status On-going   PT LONG TERM GOAL #4   Title The paitent will report  knee pain reduced from 8/10 to 4/10 for improved tolerance to mobility.   Baseline Patient consistently reports 4/10 knee pain as max pain in therapy.     Time 8   Period Weeks   Status Achieved   PT LONG TERM GOAL #5   Title The patient  will ambulate x 250 feet nonstop to demo improved tolerance to ambulation.   Baseline The patient met on 09/02/2015   Time 8   Period Weeks   Status Achieved               Plan - 09/07/15 1734    Clinical Impression Statement The patient is making progress with walking with less dependence on RW.  The patient c/o worsening back discomfort with standing dec'ing UE support.  PT recommended she discuss further with MD at visit in late July.  PT progressing to patient tolerance.    PT Treatment/Interventions ADLs/Self Care Home Management;Manual techniques;Therapeutic exercise;Therapeutic activities;Moist Heat;Cryotherapy;Neuromuscular re-education;Gait training;Stair training;DME Instruction;Functional mobility training;Patient/family education   PT Next Visit Plan Work to LTGs, progress ambulation, IT band stretching.   Consulted and Agree with Plan of Care Patient      Patient will benefit from skilled therapeutic intervention in order to improve the following deficits and impairments:  Abnormal gait, Difficulty walking, Decreased activity tolerance, Decreased balance, Decreased mobility, Decreased strength, Postural dysfunction, Impaired flexibility, Pain, Decreased endurance  Visit Diagnosis: Pain in left knee  Muscle weakness (generalized)  Other abnormalities of gait and mobility  Pain in left hip     Problem List Patient Active Problem List   Diagnosis Date Noted  . HLD (hyperlipidemia) 11/21/2014  . Depression 11/21/2014  . Nausea & vomiting 11/21/2014  . Sepsis ([place]) 11/21/2014  . Coughing 11/21/2014  . UTI (lower urinary tract infection) 11/21/2014  . AKI (acute kidney injury) ([place]) 11/21/2014  . Nausea with vomiting   .  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 HNP (herniated nucleus pulposus), cervical 10/15/2011    Class: Chronic  . Diabetes mellitus without complication ([place]) 10/08/8869  . Essential hypertension 09/30/2006  . DILATION AND CURETTAGE, HX OF 09/30/2006    Anelle Parlow, PT 09/07/2015, 5:41 PM  [facility] [address] [place], [name], [address] Phone: [phone]   Fax:  [phone]  Name: Jodi Chavez MRN: [id number] Date of Birth: Aug 07, 1950

## 2015-09-09 ENCOUNTER — Ambulatory Visit: Payer: Medicare Other | Admitting: Rehabilitative and Restorative Service Providers"

## 2015-09-09 DIAGNOSIS — M25562 Pain in left knee: Secondary | ICD-10-CM

## 2015-09-09 DIAGNOSIS — M6281 Muscle weakness (generalized): Secondary | ICD-10-CM

## 2015-09-09 DIAGNOSIS — M25552 Pain in left hip: Secondary | ICD-10-CM

## 2015-09-09 DIAGNOSIS — R2689 Other abnormalities of gait and mobility: Secondary | ICD-10-CM

## 2015-09-09 NOTE — Therapy (Signed)
[facility] [address] [place], [name], [address] Phone: [phone]   Fax:  [phone]  Physical Therapy Treatment  Patient Details  Name: Jodi Chavez MRN: [id number] Date of Birth: Sep 02, 1950 Referring Provider: Nolene Ebbs, MD  Encounter Date: 09/09/2015      PT End of Session - 09/09/15 1248    Visit Number 16   Number of Visits 16   Date for PT Re-Evaluation 09/17/15   Authorization Type G code every 10th visit   PT Start Time 1233   PT Stop Time 1315   PT Time Calculation (min) 42 min   Equipment Utilized During Treatment Gait belt   Activity Tolerance Patient tolerated treatment well   Behavior During Therapy [facility] for tasks assessed/performed      Past Medical History  Diagnosis Date  . PONV (postoperative nausea and vomiting)   . Hypertension     takes Benazepril nightly and Propranolol tid and Clonidine daily  . Hyperlipidemia     takes Zetia and Zocor daily  . History of bronchitis     last time several yrs ago  . History of migraine     many yrs ago  . Dizziness     occasionally and related to meds   . Peripheral edema   . Peripheral neuropathy ([place])   . Arthritis     hip  . Low back pain   . Constipation     uses laxatives several times a week  . Slow urinary stream     occasionally  . Diabetes mellitus     takes Metformin and Amaryl daily  . Early cataracts, bilateral   . Depression   . Insomnia     Past Surgical History  Procedure Laterality Date  . 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 D&c/hysteroscopy/ablation    . Dilation and curettage of uterus      couple of times  . Colonoscopy    . Esophagogastroduodenoscopy    . Posterior cervical fusion/foraminotomy  10/15/2011    Procedure: POSTERIOR CERVICAL FUSION/FORAMINOTOMY LEVEL 2;  Surgeon: Jessy Oto, MD;  Location: [facility];  Service: Orthopedics;  Laterality: N/A;  Right C6-7, C7-T1  Foraminotomy with excision HNP C7-T1  . Upper gastrointestinal endoscopy      There were no vitals filed for this visit.      Subjective Assessment - 09/09/15 1247    Subjective The patient reports she was sore after last therapy.  She is hot from car not having air conditioning.   Patient Stated Goals Manage pain, exercises for home for strengthening.   Currently in Pain? No/denies     THERAPEUTIC EXERCISE: Supine bridges x 10 reps with cues on lifting Supine rolling hips R/L on physioball passively for hip/back relaxation Supine leg press with ball under ankle on R and L x 5 reps each Supine SLR on R and L sides with cues and assist for L leg Sit<>stand x 10 reps  Gait: Ambulation with RW x 230 feet with tactile cues for posture and longer stride length 115 feet with RW with cues on posture and longer stride length  NEUROMUSCULAR RE-EDUCATION: Standing balance dec'ing UE support with punching R/L at shoulder height Standing balance working on posture while performing scapular retraction with red theraband (used more for balance)        PT Short Term Goals - 08/18/15 1120    PT SHORT TERM GOAL #1   Title  The patient will be indep with HEP using written insturctions for cues.   Baseline Met per subjective reports on 08/18/2015   Time 4   Period Weeks   Status Achieved   PT SHORT TERM GOAL #2   Title The patient will improve sit>stand transfers to be independent moving sit>RW x 5/5 repetitions with UE support and no assist or loss of balance.   Baseline Met on 08/12/2015   Time 4   Period Weeks   Status Achieved   PT SHORT TERM GOAL #3   Title The patient will improve walking speed from 0.56 ft/sec to > or equal to 1.0 ft/sec to demo improving functional ambulation.   Baseline Improved to 08/18/2015 to 1.35 ft/sec with Rw.   Time 4   Period Weeks   Status Achieved   PT SHORT TERM GOAL #4   Title The patient will move sit<>supine with supervision to demo improved  functional mobility.   Baseline Patient needs supervision and verbal cues to perform and coaching on technique.   Time 4   Period Weeks   Status Achieved           PT Long Term Goals - 09/02/15 1338    PT LONG TERM GOAL #1   Title The patient will be indep with HEP progression.   Baseline Target date 09/16/2015   Time 8   Period Weeks   Status On-going   PT LONG TERM GOAL #2   Title The patient will negotiate 4 steps with one handrail x supervision for improved household surface negotiation.   Baseline Target date 09/16/2015   Time 8   Period Weeks   Status On-going   PT LONG TERM GOAL #3   Title The patient will improve gait speed from 0.56 ft/sec to > or equal to 1.3 ft/sec to demo transition from safe "household" ambulator to "limited community" ambulator.   Baseline Target date 09/16/2015   Time 8   Period Weeks   Status On-going   PT LONG TERM GOAL #4   Title The paitent will report knee pain reduced from 8/10 to 4/10 for improved tolerance to mobility.   Baseline Patient consistently reports 4/10 knee pain as max pain in therapy.     Time 8   Period Weeks   Status Achieved   PT LONG TERM GOAL #5   Title The patient will ambulate x 250 feet nonstop to demo improved tolerance to ambulation.   Baseline The patient met on 09/02/2015   Time 8   Period Weeks   Status Achieved               Plan - 09/09/15 1556    Clinical Impression Statement The patient is continuing to make progress with standing posture and balance without UE support.  PT to check LTGs next visit and renew x 8 more weeks.    PT Treatment/Interventions ADLs/Self Care Home Management;Manual techniques;Therapeutic exercise;Therapeutic activities;Moist Heat;Cryotherapy;Neuromuscular re-education;Gait training;Stair training;DME Instruction;Functional mobility training;Patient/family education   PT Next Visit Plan Check LTGs, renew, progress standing tolerance.   Consulted and Agree with Plan of Care  Patient      Patient will benefit from skilled therapeutic intervention in order to improve the following deficits and impairments:  Abnormal gait, Difficulty walking, Decreased activity tolerance, Decreased balance, Decreased mobility, Decreased strength, Postural dysfunction, Impaired flexibility, Pain, Decreased endurance  Visit Diagnosis: Pain in left knee  Muscle weakness (generalized)  Other abnormalities of gait and mobility  Pain in left hip  Problem List Patient Active Problem List   Diagnosis Date Noted  . HLD (hyperlipidemia) 11/21/2014  . Depression 11/21/2014  . Nausea & vomiting 11/21/2014  . Sepsis ([place]) 11/21/2014  . Coughing 11/21/2014  . UTI (lower urinary tract infection) 11/21/2014  . AKI (acute kidney injury) ([place]) 11/21/2014  . Nausea with vomiting   . 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 HNP (herniated nucleus pulposus), cervical 10/15/2011    Class: Chronic  . Diabetes mellitus without complication ([place]) 12/20/8525  . Essential hypertension 09/30/2006  . DILATION AND CURETTAGE, HX OF 09/30/2006    Arcelia Pals, PT 09/09/2015, 3:57 PM  [facility] [address] [place], [name], [address] Phone: [phone]   Fax:  [phone]  Name: Jodi Chavez MRN: [id number] Date of Birth: Feb 09, 1951

## 2015-09-14 ENCOUNTER — Ambulatory Visit: Payer: Medicare Other | Admitting: Rehabilitative and Restorative Service Providers"

## 2015-09-14 DIAGNOSIS — M25562 Pain in left knee: Secondary | ICD-10-CM

## 2015-09-14 DIAGNOSIS — M6281 Muscle weakness (generalized): Secondary | ICD-10-CM

## 2015-09-14 DIAGNOSIS — R2689 Other abnormalities of gait and mobility: Secondary | ICD-10-CM

## 2015-09-15 ENCOUNTER — Encounter: Payer: Self-pay | Admitting: Dietician

## 2015-09-15 ENCOUNTER — Encounter: Payer: Medicare Other | Attending: Internal Medicine | Admitting: Dietician

## 2015-09-15 VITALS — Ht 61.0 in | Wt 232.2 lb

## 2015-09-15 DIAGNOSIS — E669 Obesity, unspecified: Secondary | ICD-10-CM

## 2015-09-15 DIAGNOSIS — E119 Type 2 diabetes mellitus without complications: Secondary | ICD-10-CM

## 2015-09-15 NOTE — Therapy (Signed)
[facility] [address] [place], [name], [address] Phone: [phone]   Fax:  [phone]  Physical Therapy Treatment  Patient Details  Name: Jodi Chavez MRN: [id number] Date of Birth: 04/07/1950 Referring Provider: Nolene Ebbs, MD  Encounter Date: 09/14/2015      PT End of Session - 09/14/15 1232    Visit Number 17   Number of Visits 28   Date for PT Re-Evaluation 11/18/15   Authorization Type G code every 10th visit   PT Start Time 1155   PT Stop Time 1235   PT Time Calculation (min) 40 min   Equipment Utilized During Treatment Gait belt   Activity Tolerance Patient tolerated treatment well   Behavior During Therapy [facility] for tasks assessed/performed      Past Medical History  Diagnosis Date  . PONV (postoperative nausea and vomiting)   . Hypertension     takes Benazepril nightly and Propranolol tid and Clonidine daily  . Hyperlipidemia     takes Zetia and Zocor daily  . History of bronchitis     last time several yrs ago  . History of migraine     many yrs ago  . Dizziness     occasionally and related to meds   . Peripheral edema   . Peripheral neuropathy ([place])   . Arthritis     hip  . Low back pain   . Constipation     uses laxatives several times a week  . Slow urinary stream     occasionally  . Diabetes mellitus     takes Metformin and Amaryl daily  . Early cataracts, bilateral   . Depression   . Insomnia     Past Surgical History  Procedure Laterality Date  . 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 D&c/hysteroscopy/ablation    . Dilation and curettage of uterus      couple of times  . Colonoscopy    . Esophagogastroduodenoscopy    . Posterior cervical fusion/foraminotomy  10/15/2011    Procedure: POSTERIOR CERVICAL FUSION/FORAMINOTOMY LEVEL 2;  Surgeon: Jessy Oto, MD;  Location: [facility];  Service: Orthopedics;  Laterality: N/A;  Right C6-7, C7-T1  Foraminotomy with excision HNP C7-T1  . Upper gastrointestinal endoscopy      There were no vitals filed for this visit.      Subjective Assessment - 09/14/15 1202    Subjective The patient is seeing [facility] next week.  She is concerned because she has not lost the weight they recommended.    Patient Stated Goals Manage pain, exercises for home for strengthening.   Currently in Pain? No/denies     THERAPEUTIC EXERCISE: Standing marching with UE support with cues on lifting legs forward instead of performing knee flexion x 5 reps each Standing UE alternating reaching R/L sides with cues on posture  NEUROMUSCULAR RE-EDUCATION: Sidestepping reducing dependence on UEs for support Standing balance reducing UE support with cues on posture.  Gait: Ambulation x 100 feet x 3 reps with RW modified indep Ambulation x 230 feet x 2 reps with cues on upright posture and longer stride length       PT Short Term Goals - 08/18/15 1120    PT SHORT TERM GOAL #1   Title The patient will be indep with HEP using written insturctions for cues.   Baseline Met per subjective reports on 08/18/2015   Time 4   Period Weeks   Status  Achieved   PT SHORT TERM GOAL #2   Title The patient will improve sit>stand transfers to be independent moving sit>RW x 5/5 repetitions with UE support and no assist or loss of balance.   Baseline Met on 08/12/2015   Time 4   Period Weeks   Status Achieved   PT SHORT TERM GOAL #3   Title The patient will improve walking speed from 0.56 ft/sec to > or equal to 1.0 ft/sec to demo improving functional ambulation.   Baseline Improved to 08/18/2015 to 1.35 ft/sec with Rw.   Time 4   Period Weeks   Status Achieved   PT SHORT TERM GOAL #4   Title The patient will move sit<>supine with supervision to demo improved functional mobility.   Baseline Patient needs supervision and verbal cues to perform and coaching on technique.   Time 4   Period Weeks   Status Achieved            PT Long Term Goals - 09/14/15 1207    PT LONG TERM GOAL #1   Title The patient will be indep with HEP progression.   Baseline Per patient report.   Time 8   Period Weeks   Status Achieved   PT LONG TERM GOAL #2   Title The patient will negotiate 4 steps with one handrail x supervision for improved household surface negotiation.   Baseline The patient uses 2 handrails and is supervision with step to pattern.    Time 8   Period Weeks   Status Partially Met   PT LONG TERM GOAL #3   Title The patient will improve gait speed from 0.56 ft/sec to > or equal to 1.3 ft/sec to demo transition from safe "household" ambulator to "limited community" ambulator.   Baseline 0.98 ft/sec today, can vary some in therapy depending on the day.   Time 8   Period Weeks   Status Partially Met   PT LONG TERM GOAL #4   Title The paitent will report knee pain reduced from 8/10 to 4/10 for improved tolerance to mobility.   Baseline Patient consistently reports 4/10 knee pain as max pain in therapy.     Time 8   Period Weeks   Status Achieved   PT LONG TERM GOAL #5   Title The patient will ambulate x 250 feet nonstop to demo improved tolerance to ambulation.   Baseline The patient met on 09/02/2015   Time 8   Period Weeks   Status Achieved     UPDATED GOALS:     PT Short Term Goals - 09/15/15 1254    PT SHORT TERM GOAL #1   Title The patient will tolerate 10 minutes of standing activities before requring rest break.   Baseline Target date 10/16/2015   Time 4   Period Weeks   Status New   PT SHORT TERM GOAL #2   Title The patient will improve stair negotiation with one handrail x 4 steps with supervision to demo dec'ing dependence on bilateral UEs for stair negotiation.   Baseline Target date 10/16/2015   Time 4   Period Weeks   Status New   PT SHORT TERM GOAL #3   Title The patient will improve walking speed from 0.98 ft/sec to > or equal to 1.2 ft/sec to demo improving mobility.   Baseline  Target date 10/16/2015   Time 4   Period Weeks   Status New   PT SHORT TERM GOAL #4   Title The  patient will ambulate with RW modified indep x 300 ft nonstop to demo improved tolerance to walking.   Baseline Target date 10/16/2015   Time 4   Period Weeks         PT Long Term Goals - 09/15/15 1251    PT LONG TERM GOAL #1   Title The patient will be indep with future progression of HEP.   Baseline Target date 11/16/2015   Time 8   Period Weeks   Status New   PT LONG TERM GOAL #2   Title The patient will ambulate 345 ft with RW modified indep for improved endurance.   Baseline Target date 11/16/2015   Time 8   Period Weeks   Status New   PT LONG TERM GOAL #3   Title The patient will move sit<>stand x 10 reps without UE support to demo improved LE strength   Baseline Target date 11/16/2015   Time 8   Period Weeks   Status New   PT LONG TERM GOAL #4   Title The patient will improve gait speed to > or equal to 1.4 ft/sec (from 0.98 ft/sec today).   Baseline Target date 11/16/2015   Time 8   Period Weeks   Status New               Plan - 09/15/15 1247    Clinical Impression Statement The patient has met 3 LTGs and partially met 2 LTGs.  She is progressing with general mobility, exercise tolerance, and reducing pain with PT.  PT to continue x 8 more weeks to progress mobility to optimize functional status.    PT Frequency 2x / week   PT Duration 4 weeks  + 1/week x 4 more weeks   PT Treatment/Interventions ADLs/Self Care Home Management;Manual techniques;Therapeutic exercise;Therapeutic activities;Moist Heat;Cryotherapy;Neuromuscular re-education;Gait training;Stair training;DME Instruction;Functional mobility training;Patient/family education   PT Next Visit Plan Progress standing tolerance, gait, upright posture   Consulted and Agree with Plan of Care Patient      Patient will benefit from skilled therapeutic intervention in order to improve the following deficits and  impairments:  Abnormal gait, Difficulty walking, Decreased activity tolerance, Decreased balance, Decreased mobility, Decreased strength, Postural dysfunction, Impaired flexibility, Pain, Decreased endurance  Visit Diagnosis: Pain in left knee  Muscle weakness (generalized)  Other abnormalities of gait and mobility     Problem List Patient Active Problem List   Diagnosis Date Noted  . HLD (hyperlipidemia) 11/21/2014  . Depression 11/21/2014  . Nausea & vomiting 11/21/2014  . Sepsis ([place]) 11/21/2014  . Coughing 11/21/2014  . UTI (lower urinary tract infection) 11/21/2014  . AKI (acute kidney injury) ([place]) 11/21/2014  . Nausea with vomiting   . 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 HNP (herniated nucleus pulposus), cervical 10/15/2011    Class: Chronic  . Diabetes mellitus without complication ([place]) 40/98/1191  . Essential hypertension 09/30/2006  . DILATION AND CURETTAGE, HX OF 09/30/2006    Shyenne Maggard, PT 09/15/2015, 12:48 PM  [facility] [address] [place], [name], [address] Phone: [phone]   Fax:  [phone]  Name: ALANEE TING MRN: [id number] Date of Birth: April 16, 1950

## 2015-09-15 NOTE — Progress Notes (Signed)
Medical Nutrition Therapy:  Appt start time: 1430 end time:  1530.   Assessment:  Primary concerns today: Follow-up for weight loss and diabetes.  Patient states that she has not been successful on the eating plan set up at her last visit.  She is struggling because she does not like many vegetables.  She also requests for us to review the portion sizes they discussed at her last visit.  Patient is here alone.  Her doctor wants her to lose 30 lbs before she can have hip surgery.  Weight today has increased since last visit, from 226 lbs to 232 lbs.  Patient lives alone usually but has been living with her sister since last Sept due to orthopedic problems.  She is awaiting hip surgery and hopes to be more independent after this.  Her sister is currently doing the shopping and cooking.  Patient has good nutrition questions and appears motivated to make changes to her eating habits for weight loss.  She makes many statements that show her knowledge of carbohydrate containing foods and adjustments/substitutions to make to her food choices for managing her blood sugar.  She does admit to eating mini candy bars or NABS when her sugar feels low.  Discussed alternatives today for treating hypoglycemia such as glucose tablets.  She was agreeable to the plan we put into place.  DIETARY INTAKE:  Usual eating pattern includes 2 meals and 2 snacks per day.  Everyday foods include sugar free cookies and sugar free ice cream.  Avoided foods include Most non starchy vegetables.    24-hr recall:  B (AM): none Snk ( AM): none  L (11-12 AM):  1 cup instant grits and 2 slices [employer] toast, peanut butter and sugar-free jelly, unsweetened applesauce with splenda, and sugar-free pudding, diet 7-up Snk (2-3 PM):  No added sugar popsicle or none D (5-6 PM):   2 chicken nuggets, mashed potatoes, lima beans OR meatloaf, mashed potatoes, green beans OR out to eat to [employer] or [employer] Snk (10 PM):  1-2 scoops ice cream or  sugar free cookies or NABS Beverages: diet 7up, 3 cups of water daily, occasionally sweet tea or unsweetened with sweet and low.  Usual physical activity: ADL's.  Physical therapy 2 times a week since May.  Patient uses a walker.  Estimated energy needs: 1200 calories 135 g carbohydrates 90 g protein 33 g fat  Progress Towards Goal(s):  In progress.    Intervention:  Nutrition counseling/education for weight reduction.  Reviewed portion sizes for proteins, fats, and carbohydrates.  Demonstrated portions using food models and measuring cups.  Recommended she purchase measuring cups to use at home as she becomes comfortable with these portion recommendations.  Provided and reviewed some example meal options that follow these portion guidelines.  Discussed listening to hunger and fullness cues and importance of meal timing.  Reviewed healthy snack options.  Discussed choices when eating out.  Created a personal meal plan guideline for her including restaurant choices that fit within a 400-450 calorie budget.  Encouraged increased water intake and reducing diet soda intake.  Instructed patient to review her new materials with her sister and invited her sister to attend future visits since she is handling majority of food purchasing and preparation.  Handouts given during visit include:  Example Meal Options for Women (30 grams of carbohydrate)  Personalized meal plan created today with patient's input including restaurant options at 400-450 calories  Handouts reviewed from last visit: - Low Carbohydrate Snack Suggestions -  Yellow Card Meal Planner  Barriers to learning/adherence to lifestyle change: decreased ability to exercise, dislike of many foods/ limited food preferences  Demonstrated degree of understanding via:  Teach Back   Monitoring/Evaluation:  Dietary intake, exercise, and body weight in 1 month(s).

## 2015-09-16 ENCOUNTER — Ambulatory Visit: Payer: Medicare Other | Admitting: Rehabilitative and Restorative Service Providers"

## 2015-09-21 ENCOUNTER — Ambulatory Visit: Payer: Medicare Other | Admitting: Rehabilitative and Restorative Service Providers"

## 2015-09-21 DIAGNOSIS — M25552 Pain in left hip: Secondary | ICD-10-CM

## 2015-09-21 DIAGNOSIS — M25562 Pain in left knee: Secondary | ICD-10-CM

## 2015-09-21 DIAGNOSIS — M6281 Muscle weakness (generalized): Secondary | ICD-10-CM

## 2015-09-21 DIAGNOSIS — R2689 Other abnormalities of gait and mobility: Secondary | ICD-10-CM

## 2015-09-21 NOTE — Therapy (Signed)
[facility] [address] [place], [place], [address] Phone: [phone]   Fax:  [phone]  Physical Therapy Treatment  Patient Details  Name: Jodi Chavez MRN: [id number] Date of Birth: Sep 13, 1950 Referring Provider: Fleet Contras, MD  Encounter Date: 09/21/2015      PT End of Session - 09/21/15 1132    Visit Number 18   Number of Visits 28   Date for PT Re-Evaluation 11/18/15   Authorization Type G code every 10th visit   PT Start Time 1115   PT Stop Time 1147   PT Time Calculation (min) 32 min   Equipment Utilized During Treatment Gait belt   Activity Tolerance Patient tolerated treatment well   Behavior During Therapy [facility] for tasks assessed/performed      Past Medical History:  Diagnosis Date  . Arthritis    hip  . Constipation    uses laxatives several times a week  . Depression   . Diabetes mellitus    takes Metformin and Amaryl daily  . Dizziness    occasionally and related to meds   . Early cataracts, bilateral   . History of bronchitis    last time several yrs ago  . History of migraine    many yrs ago  . Hyperlipidemia    takes Zetia and Zocor daily  . Hypertension    takes Benazepril nightly and Propranolol tid and Clonidine daily  . Insomnia   . Low back pain   . Peripheral edema   . Peripheral neuropathy ([facility])   . PONV (postoperative nausea and vomiting)   . Slow urinary stream    occasionally    Past Surgical History:  Procedure Laterality Date  . COLONOSCOPY    . d&c/hysteroscopy/ablation    . DILATION AND CURETTAGE OF UTERUS     couple of times  . ESOPHAGOGASTRODUODENOSCOPY    . 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 POSTERIOR CERVICAL FUSION/FORAMINOTOMY  10/15/2011   Procedure: POSTERIOR CERVICAL FUSION/FORAMINOTOMY LEVEL 2;  Surgeon: Kerrin Champagne, MD;  Location: [facility];  Service: Orthopedics;  Laterality: N/A;  Right C6-7, C7-T1 Foraminotomy  with excision HNP C7-T1  . UPPER GASTROINTESTINAL ENDOSCOPY      There were no vitals filed for this visit.      Subjective Assessment - 09/21/15 1128    Subjective The patient is going to [employer].  She had a stomach bug last week.  She saw nutritionist last week and thinks it will be helpful.    Patient Stated Goals Manage pain, exercises for home for strengthening.   Currently in Pain? No/denies             [facility] Adult PT Treatment/Exercise - 09/21/15 1129      Ambulation/Gait   Ambulation/Gait Yes   Ambulation/Gait Assistance 6: Modified independent (Device/Increase time)   Ambulation Distance (Feet) 345 Feet   Assistive device Rolling walker   Ambulation Surface Level   Gait Comments The patient needs cues on postural upright, longer stride length, and breathing cues.       Neuro Re-ed    Neuro Re-ed Details  Standing wall bumps dec'ing UE support.  Standing balance reaching R and L sides without UE support with emphasis on postural upright.      Exercises   Exercises Knee/Hip;Other Exercises   Other Exercises  Trunk exercises for trunk rotation holding onto countertop; trunk flexion/extension leaning against countertop.   The patient also perfomred  seated stepper sci-fit x 10 minutes at end of session (no charge as rehab tech set her up for exercise).                   PT Short Term Goals - 09/15/15 1254      PT SHORT TERM GOAL #1   Title The patient will tolerate 10 minutes of standing activities before requring rest break.   Baseline Target date 10/16/2015   Time 4   Period Weeks   Status New     PT SHORT TERM GOAL #2   Title The patient will improve stair negotiation with one handrail x 4 steps with supervision to demo dec'ing dependence on bilateral UEs for stair negotiation.   Baseline Target date 10/16/2015   Time 4   Period Weeks   Status New     PT SHORT TERM GOAL #3   Title The patient will improve walking speed from 0.98 ft/sec to >  or equal to 1.2 ft/sec to demo improving mobility.   Baseline Target date 10/16/2015   Time 4   Period Weeks   Status New     PT SHORT TERM GOAL #4   Title The patient will ambulate with RW modified indep x 300 ft nonstop to demo improved tolerance to walking.   Baseline Target date 10/16/2015   Time 4   Period Weeks           PT Long Term Goals - 09/15/15 1251      PT LONG TERM GOAL #1   Title The patient will be indep with future progression of HEP.   Baseline Target date 11/16/2015   Time 8   Period Weeks   Status New     PT LONG TERM GOAL #2   Title The patient will ambulate 345 ft with RW modified indep for improved endurance.   Baseline Target date 11/16/2015   Time 8   Period Weeks   Status New     PT LONG TERM GOAL #3   Title The patient will move sit<>stand x 10 reps without UE support to demo improved LE strength   Baseline Target date 11/16/2015   Time 8   Period Weeks   Status New     PT LONG TERM GOAL #4   Title The patient will improve gait speed to > or equal to 1.4 ft/sec (from 0.98 ft/sec today).   Baseline Target date 11/16/2015   Time 8   Period Weeks   Status New               Plan - 09/21/15 1709    Clinical Impression Statement The patient is demonstrating improvement in walking distance and standing balance without UE support.  PT to continue progressing to new STGs/LTGs.   PT Treatment/Interventions ADLs/Self Care Home Management;Manual techniques;Therapeutic exercise;Therapeutic activities;Moist Heat;Cryotherapy;Neuromuscular re-education;Gait training;Stair training;DME Instruction;Functional mobility training;Patient/family education   PT Next Visit Plan Progress standing tolerance, gait, upright posture   Consulted and Agree with Plan of Care Patient      Patient will benefit from skilled therapeutic intervention in order to improve the following deficits and impairments:  Abnormal gait, Difficulty walking, Decreased activity  tolerance, Decreased balance, Decreased mobility, Decreased strength, Postural dysfunction, Impaired flexibility, Pain, Decreased endurance  Visit Diagnosis: Pain in left knee  Muscle weakness (generalized)  Other abnormalities of gait and mobility  Pain in left hip     Problem List Patient Active Problem List   Diagnosis Date Noted  .  HLD (hyperlipidemia) 11/21/2014  . Depression 11/21/2014  . Nausea & vomiting 11/21/2014  . Sepsis ([facility]) 11/21/2014  . Coughing 11/21/2014  . UTI (lower urinary tract infection) 11/21/2014  . AKI (acute kidney injury) ([facility]) 11/21/2014  . Nausea with vomiting   . 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 HNP (herniated nucleus pulposus), cervical 10/15/2011    Class: Chronic  . Diabetes mellitus without complication ([facility]) 09/30/2006  . Essential hypertension 09/30/2006  . DILATION AND CURETTAGE, HX OF 09/30/2006    Demarious Kapur, PT 09/21/2015, 5:10 PM  [facility] [address] [place], [place], [address] Phone: [phone]   Fax:  [phone]  Name: CHRISTIANNA BELMONTE MRN: [id number] Date of Birth: 1950-06-09

## 2015-09-22 DIAGNOSIS — M25562 Pain in left knee: Secondary | ICD-10-CM | POA: Insufficient documentation

## 2015-09-23 ENCOUNTER — Ambulatory Visit: Payer: Medicare Other | Admitting: Rehabilitative and Restorative Service Providers"

## 2015-09-23 DIAGNOSIS — M25562 Pain in left knee: Secondary | ICD-10-CM

## 2015-09-23 DIAGNOSIS — M6281 Muscle weakness (generalized): Secondary | ICD-10-CM

## 2015-09-23 DIAGNOSIS — R2689 Other abnormalities of gait and mobility: Secondary | ICD-10-CM

## 2015-09-23 NOTE — Therapy (Signed)
[facility] [address] [place], [place], [address] Phone: [phone]   Fax:  [phone]  Physical Therapy Treatment  Patient Details  Name: Jodi Chavez MRN: [id number] Date of Birth: [birth year] Referring Provider: Fleet Contras, MD  Encounter Date: 09/23/2015      PT End of Session - 09/23/15 1104    Visit Number 19   Number of Visits 28   Date for PT Re-Evaluation 11/18/15   Authorization Type G code every 10th visit   PT Start Time 1103   PT Stop Time 1145   PT Time Calculation (min) 42 min   Equipment Utilized During Treatment Gait belt   Activity Tolerance Patient tolerated treatment well   Behavior During Therapy [facility] for tasks assessed/performed      Past Medical History:  Diagnosis Date  . Arthritis    hip  . Constipation    uses laxatives several times a week  . Depression   . Diabetes mellitus    takes Metformin and Amaryl daily  . Dizziness    occasionally and related to meds   . Early cataracts, bilateral   . History of bronchitis    last time several yrs ago  . History of migraine    many yrs ago  . Hyperlipidemia    takes Zetia and Zocor daily  . Hypertension    takes Benazepril nightly and Propranolol tid and Clonidine daily  . Insomnia   . Low back pain   . Peripheral edema   . Peripheral neuropathy (HCC)   . PONV (postoperative nausea and vomiting)   . Slow urinary stream    occasionally    Past Surgical History:  Procedure Laterality Date  . COLONOSCOPY    . d&c/hysteroscopy/ablation    . DILATION AND CURETTAGE OF UTERUS     couple of times  . ESOPHAGOGASTRODUODENOSCOPY    . 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 POSTERIOR CERVICAL FUSION/FORAMINOTOMY  10/15/2011   Procedure: POSTERIOR CERVICAL FUSION/FORAMINOTOMY LEVEL 2;  Surgeon: Kerrin Champagne, MD;  Location: [facility];  Service: Orthopedics;  Laterality: N/A;  Right C6-7, C7-T1 Foraminotomy  with excision HNP C7-T1  . UPPER GASTROINTESTINAL ENDOSCOPY      There were no vitals filed for this visit.      Subjective Assessment - 09/23/15 1122    Subjective The patient had appt at [facility] yesterday with Dr. Constance Goltz and also saw Nurse Practicioner.  She received a steroid injection in the L knee and has instructions to avoid physical activity for 24-48 hours after injection.  Therefore, PT discussed with patient we will not progress or perform any activities today.  She reports they will not consider surgery on her hip until she has lost 30 pounds.     Patient Stated Goals Manage pain, exercises for home for strengthening.   Currently in Pain? No/denies             [facility] Adult PT Treatment/Exercise - 09/23/15 1126      Self-Care   Self-Care Other Self-Care Comments   Other Self-Care Comments  The patient and PT discussed ways to progress to community exercises for future mobility and post d/c exercise plan.   We discussed how physical activity would help with her weight loss goals.  We discussed [place] and [place] options as she is between both cities t/o the week.  She feels that seeking [place] location may be best.  PT discussed scholarship programs at  the [employer].            PT Short Term Goals - 09/15/15 1254      PT SHORT TERM GOAL #1   Title The patient will tolerate 10 minutes of standing activities before requring rest break.   Baseline Target date 10/16/2015   Time 4   Period Weeks   Status New     PT SHORT TERM GOAL #2   Title The patient will improve stair negotiation with one handrail x 4 steps with supervision to demo dec'ing dependence on bilateral UEs for stair negotiation.   Baseline Target date 10/16/2015   Time 4   Period Weeks   Status New     PT SHORT TERM GOAL #3   Title The patient will improve walking speed from 0.98 ft/sec to > or equal to 1.2 ft/sec to demo improving mobility.   Baseline Target date 10/16/2015   Time 4   Period Weeks    Status New     PT SHORT TERM GOAL #4   Title The patient will ambulate with RW modified indep x 300 ft nonstop to demo improved tolerance to walking.   Baseline Target date 10/16/2015   Time 4   Period Weeks           PT Long Term Goals - 09/15/15 1251      PT LONG TERM GOAL #1   Title The patient will be indep with future progression of HEP.   Baseline Target date 11/16/2015   Time 8   Period Weeks   Status New     PT LONG TERM GOAL #2   Title The patient will ambulate 345 ft with RW modified indep for improved endurance.   Baseline Target date 11/16/2015   Time 8   Period Weeks   Status New     PT LONG TERM GOAL #3   Title The patient will move sit<>stand x 10 reps without UE support to demo improved LE strength   Baseline Target date 11/16/2015   Time 8   Period Weeks   Status New     PT LONG TERM GOAL #4   Title The patient will improve gait speed to > or equal to 1.4 ft/sec (from 0.98 ft/sec today).   Baseline Target date 11/16/2015   Time 8   Period Weeks   Status New               Plan - 09/23/15 1128    Clinical Impression Statement The patient has new personal goals to lose 30 pounds in order to be a surgical candidate at [facility] for her hip.  PT and patient focused on education today to discuss community exercise options for future weight mgmt and mobility.   PT Treatment/Interventions ADLs/Self Care Home Management;Manual techniques;Therapeutic exercise;Therapeutic activities;Moist Heat;Cryotherapy;Neuromuscular re-education;Gait training;Stair training;DME Instruction;Functional mobility training;Patient/family education   PT Next Visit Plan Progress standing tolerance, gait, upright posture; community resources [employer] and Agree with Plan of Care Patient      Patient will benefit from skilled therapeutic intervention in order to improve the following deficits and impairments:  Abnormal gait, Difficulty walking, Decreased activity  tolerance, Decreased balance, Decreased mobility, Decreased strength, Postural dysfunction, Impaired flexibility, Pain, Decreased endurance  Visit Diagnosis: No diagnosis found.     Problem List Patient Active Problem List   Diagnosis Date Noted  . HLD (hyperlipidemia) 11/21/2014  . Depression 11/21/2014  . Nausea & vomiting 11/21/2014  . Sepsis (HCC) 11/21/2014  .  Coughing 11/21/2014  . UTI (lower urinary tract infection) 11/21/2014  . AKI (acute kidney injury) (HCC) 11/21/2014  . Nausea with vomiting   . 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 HNP (herniated nucleus pulposus), cervical 10/15/2011    Class: Chronic  . Diabetes mellitus without complication (HCC) 09/30/2006  . Essential hypertension 09/30/2006  . DILATION AND CURETTAGE, HX OF 09/30/2006    Jodi Chavez, PT 09/23/2015, 11:29 AM  [place] [facility] [address] [place], [place], [address] Phone: [phone]   Fax:  [phone]  Name: Jodi Chavez MRN: [id number] Date of Birth: 07/29/1950

## 2015-09-28 ENCOUNTER — Ambulatory Visit: Payer: Medicare Other | Attending: Internal Medicine | Admitting: Rehabilitative and Restorative Service Providers"

## 2015-09-28 DIAGNOSIS — M25562 Pain in left knee: Secondary | ICD-10-CM | POA: Diagnosis not present

## 2015-09-28 DIAGNOSIS — M25552 Pain in left hip: Secondary | ICD-10-CM | POA: Diagnosis present

## 2015-09-28 DIAGNOSIS — M6281 Muscle weakness (generalized): Secondary | ICD-10-CM

## 2015-09-28 DIAGNOSIS — R2689 Other abnormalities of gait and mobility: Secondary | ICD-10-CM

## 2015-09-29 NOTE — Therapy (Signed)
[facility] [address] [place], [name], [address] Phone: [phone]   Fax:  [phone]  Physical Therapy Treatment  Patient Details  Name: Jodi Chavez MRN: [id number] Date of Birth: 11-Nov-1950 Referring Provider: Nolene Ebbs, MD  Encounter Date: 09/28/2015      PT End of Session - 09/28/15 1146    Visit Number 20   Number of Visits 28   Date for PT Re-Evaluation 11/18/15   Authorization Type G code every 10th visit   PT Start Time 1105   PT Stop Time 1146   PT Time Calculation (min) 41 min   Equipment Utilized During Treatment Gait belt   Activity Tolerance Patient tolerated treatment well   Behavior During Therapy [facility] for tasks assessed/performed      Past Medical History:  Diagnosis Date  . Arthritis    hip  . Constipation    uses laxatives several times a week  . Depression   . Diabetes mellitus    takes Metformin and Amaryl daily  . Dizziness    occasionally and related to meds   . Early cataracts, bilateral   . History of bronchitis    last time several yrs ago  . History of migraine    many yrs ago  . Hyperlipidemia    takes Zetia and Zocor daily  . Hypertension    takes Benazepril nightly and Propranolol tid and Clonidine daily  . Insomnia   . Low back pain   . Peripheral edema   . Peripheral neuropathy ([place])   . PONV (postoperative nausea and vomiting)   . Slow urinary stream    occasionally    Past Surgical History:  Procedure Laterality Date  . COLONOSCOPY    . d&c/hysteroscopy/ablation    . DILATION AND CURETTAGE OF UTERUS     couple of times  . ESOPHAGOGASTRODUODENOSCOPY    . 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 POSTERIOR CERVICAL FUSION/FORAMINOTOMY  10/15/2011   Procedure: POSTERIOR CERVICAL FUSION/FORAMINOTOMY LEVEL 2;  Surgeon: Jessy Oto, MD;  Location: [place];  Service: Orthopedics;  Laterality: N/A;  Right C6-7, C7-T1 Foraminotomy with  excision HNP C7-T1  . UPPER GASTROINTESTINAL ENDOSCOPY      There were no vitals filed for this visit.      Subjective Assessment - 09/28/15 1110    Subjective The patient waited a couple of days to do exercises due to receiving injection last week.  She reports it is feeling better.     Patient Stated Goals Manage pain, exercises for home for strengthening.   Currently in Pain? No/denies                         [facility] Adult PT Treatment/Exercise - 09/28/15 1120      Ambulation/Gait   Ambulation/Gait Yes   Ambulation/Gait Assistance 6: Modified independent (Device/Increase time)   Ambulation Distance (Feet) 315 Feet   Assistive device Rolling walker   Ambulation Surface Level   Gait Comments Patient performed 315 feet nonstop.,     Neuro Re-ed    Neuro Re-ed Details  Standing balance without UE support intermittently.  She also worked on standing tolerance for 10 minutes with occasional reaching tasks/activities.  Performed reaching into cabinets at 90 degree shoulder height in order to improve standing balance/tolerance.      Exercises   Exercises Other Exercises;Knee/Hip     Knee/Hip Exercises: Supine   Bridges AROM;10  reps  minimal movement   Straight Leg Raises AROM;Right;Left  10 reps with cues for target to prevent ER   Other Supine Knee/Hip Exercises hip extension with ball under ankle with cues for quad contraction x 5 reps R and L sides.     Knee/Hip Exercises: Sidelying   Hip ABduction Right;Left;AAROM  5 reps with PT assisting lifting of leg                  PT Short Term Goals - 09/28/15 1118      PT SHORT TERM GOAL #1   Title The patient will tolerate 10 minutes of standing activities before requring rest break.   Baseline Met on 09/28/2015   Time 4   Period Weeks   Status Achieved     PT SHORT TERM GOAL #2   Title The patient will improve stair negotiation with one handrail x 4 steps with supervision to demo dec'ing dependence  on bilateral UEs for stair negotiation.   Baseline Target date 10/16/2015   Time 4   Period Weeks   Status On-going     PT SHORT TERM GOAL #3   Title The patient will improve walking speed from 0.98 ft/sec to > or equal to 1.2 ft/sec to demo improving mobility.   Baseline Target date 10/16/2015   Time 4   Period Weeks   Status On-going     PT SHORT TERM GOAL #4   Title The patient will ambulate with RW modified indep x 300 ft nonstop to demo improved tolerance to walking.   Baseline Met on 09/28/2015   Time 4   Period Weeks   Status Achieved           PT Long Term Goals - 09/29/15 1414      PT LONG TERM GOAL #1   Title The patient will be indep with future progression of HEP.   Baseline Target date 11/16/2015   Time 8   Period Weeks   Status On-going     PT LONG TERM GOAL #2   Title The patient will ambulate 345 ft with RW modified indep for improved endurance.   Baseline Target date 11/16/2015   Time 8   Period Weeks   Status On-going     PT LONG TERM GOAL #3   Title The patient will move sit<>stand x 10 reps without UE support to demo improved LE strength   Baseline Target date 11/16/2015   Time 8   Period Weeks   Status On-going     PT LONG TERM GOAL #4   Title The patient will improve gait speed to > or equal to 1.4 ft/sec (from 0.98 ft/sec today).   Baseline Target date 11/16/2015   Time 8   Period Weeks   Status On-going               Plan - 09/29/15 1414    Clinical Impression Statement The patient met STG for ambulation distance.  She is progressing standing tolerance, walking distances and is demonstrating improved functional mobility.  PT to continue working towards current STGs/LTGs.   Rehab Potential Good   PT Frequency 2x / week   PT Duration 8 weeks   PT Treatment/Interventions ADLs/Self Care Home Management;Manual techniques;Therapeutic exercise;Therapeutic activities;Moist Heat;Cryotherapy;Neuromuscular re-education;Gait training;Stair  training;DME Instruction;Functional mobility training;Patient/family education   PT Next Visit Plan Progress standing tolerance, gait, upright posture; community resources [employer] and Agree with Plan of Care Patient  Patient will benefit from skilled therapeutic intervention in order to improve the following deficits and impairments:  Abnormal gait, Difficulty walking, Decreased activity tolerance, Decreased balance, Decreased mobility, Decreased strength, Postural dysfunction, Impaired flexibility, Pain, Decreased endurance  Visit Diagnosis: Pain in left knee  Muscle weakness (generalized)  Other abnormalities of gait and mobility  Pain in left hip       G-Codes - 2015-10-19 1417    Functional Assessment Tool Used Walking 300 ft nonstop with RW, gait speed=1.35 ft/sec   Functional Limitation Mobility: Walking and moving around   Mobility: Walking and Moving Around Current Status [phone]) At least 20 percent but less than 40 percent impaired, limited or restricted   Mobility: Walking and Moving Around Goal Status [phone]) At least 20 percent but less than 40 percent impaired, limited or restricted     Physical Therapy Progress Note  Dates of Reporting Period: 07/19/2015 to 19-Oct-2015  Objective Reports of Subjective Statement: The patient has improved gait speed in therapy, her ability to walk longer distances.  She is getting out into the community with RW more often due to improved mobility.  Objective Measurements: Gait speed=1.35 ft/sec, ambulates 300 ft nonstop with RW mod indep, no pain in L knee or hip at test.  Goal Update: see above  Plan: continue with plan as stated above  Reason Skilled Services are Required: The patient is demonstrating progress with ambulation in community, standing tolerance, performance of ADLs, and independence with mobility.  PT to progress to community program for post d/c activities.    Problem List Patient Active Problem  List   Diagnosis Date Noted  . HLD (hyperlipidemia) 11/21/2014  . Depression 11/21/2014  . Nausea & vomiting 11/21/2014  . Sepsis ([place]) 11/21/2014  . Coughing 11/21/2014  . UTI (lower urinary tract infection) 11/21/2014  . AKI (acute kidney injury) ([place]) 11/21/2014  . Nausea with vomiting   . 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 HNP (herniated nucleus pulposus), cervical 10/15/2011    Class: Chronic  . Diabetes mellitus without complication ([place]) 17/35/6701  . Essential hypertension 09/30/2006  . DILATION AND CURETTAGE, HX OF 09/30/2006    Ashyra Cantin, PT 09/29/2015, 2:18 PM  [facility] [address] [place], [name], [address] Phone: [phone]   Fax:  [phone]  Name: Jodi Chavez MRN: [id number] Date of Birth: 1951/01/09

## 2015-09-30 ENCOUNTER — Ambulatory Visit: Payer: Medicare Other | Admitting: Rehabilitative and Restorative Service Providers"

## 2015-09-30 DIAGNOSIS — M25562 Pain in left knee: Secondary | ICD-10-CM | POA: Diagnosis not present

## 2015-09-30 DIAGNOSIS — R2689 Other abnormalities of gait and mobility: Secondary | ICD-10-CM

## 2015-09-30 DIAGNOSIS — M25552 Pain in left hip: Secondary | ICD-10-CM

## 2015-09-30 DIAGNOSIS — M6281 Muscle weakness (generalized): Secondary | ICD-10-CM

## 2015-09-30 NOTE — Therapy (Signed)
[facility] [address] [place], [name], [address] Phone: [phone]   Fax:  [phone]  Physical Therapy Treatment  Patient Details  Name: Jodi Chavez MRN: [id number] Date of Birth: 10-13-1950 Referring Provider: Nolene Ebbs, MD  Encounter Date: 09/30/2015      PT End of Session - 09/30/15 1136    Visit Number 21   Number of Visits 28   Date for PT Re-Evaluation 11/18/15   Authorization Type G code every 10th visit   PT Start Time 1109   PT Stop Time 1148   PT Time Calculation (min) 39 min   Equipment Utilized During Treatment Gait belt   Activity Tolerance Patient tolerated treatment well   Behavior During Therapy [facility] for tasks assessed/performed      Past Medical History:  Diagnosis Date  . Arthritis    hip  . Constipation    uses laxatives several times a week  . Depression   . Diabetes mellitus    takes Metformin and Amaryl daily  . Dizziness    occasionally and related to meds   . Early cataracts, bilateral   . History of bronchitis    last time several yrs ago  . History of migraine    many yrs ago  . Hyperlipidemia    takes Zetia and Zocor daily  . Hypertension    takes Benazepril nightly and Propranolol tid and Clonidine daily  . Insomnia   . Low back pain   . Peripheral edema   . Peripheral neuropathy ([place])   . PONV (postoperative nausea and vomiting)   . Slow urinary stream    occasionally    Past Surgical History:  Procedure Laterality Date  . COLONOSCOPY    . d&c/hysteroscopy/ablation    . DILATION AND CURETTAGE OF UTERUS     couple of times  . ESOPHAGOGASTRODUODENOSCOPY    . 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 POSTERIOR CERVICAL FUSION/FORAMINOTOMY  10/15/2011   Procedure: POSTERIOR CERVICAL FUSION/FORAMINOTOMY LEVEL 2;  Surgeon: Jessy Oto, MD;  Location: [place];  Service: Orthopedics;  Laterality: N/A;  Right C6-7, C7-T1 Foraminotomy with  excision HNP C7-T1  . UPPER GASTROINTESTINAL ENDOSCOPY      There were no vitals filed for this visit.      Subjective Assessment - 09/30/15 1134    Subjective The patient was running late today.  She was not sore after therapy 2 days ago.     Patient Stated Goals Manage pain, exercises for home for strengthening.   Currently in Pain? No/denies                         [facility] Adult PT Treatment/Exercise - 09/30/15 1134      Ambulation/Gait   Ambulation/Gait Yes   Ambulation/Gait Assistance 6: Modified independent (Device/Increase time)   Ambulation Distance (Feet) 345 Feet   Assistive device Rolling walker   Ambulation Surface Level   Gait Comments --     Neuro Re-ed    Neuro Re-ed Details  --     Exercises   Exercises Other Exercises;Knee/Hip     Knee/Hip Exercises: Supine   Bridges AROM;10 reps  minimal movement   Straight Leg Raises AROM;Right;Left  10 reps with cues for target to prevent ER   Other Supine Knee/Hip Exercises sidelying clam shells x 10 reps each side. sit<>stand x 10 reps.      Knee/Hip Exercises: Sidelying  Hip ABduction --  5 reps with PT assisting lifting of leg                  PT Short Term Goals - 09/28/15 1118      PT SHORT TERM GOAL #1   Title The patient will tolerate 10 minutes of standing activities before requring rest break.   Baseline Met on 09/28/2015   Time 4   Period Weeks   Status Achieved     PT SHORT TERM GOAL #2   Title The patient will improve stair negotiation with one handrail x 4 steps with supervision to demo dec'ing dependence on bilateral UEs for stair negotiation.   Baseline Target date 10/16/2015   Time 4   Period Weeks   Status On-going     PT SHORT TERM GOAL #3   Title The patient will improve walking speed from 0.98 ft/sec to > or equal to 1.2 ft/sec to demo improving mobility.   Baseline Target date 10/16/2015   Time 4   Period Weeks   Status On-going     PT SHORT TERM GOAL #4    Title The patient will ambulate with RW modified indep x 300 ft nonstop to demo improved tolerance to walking.   Baseline Met on 09/28/2015   Time 4   Period Weeks   Status Achieved           PT Long Term Goals - 09/29/15 1414      PT LONG TERM GOAL #1   Title The patient will be indep with future progression of HEP.   Baseline Target date 11/16/2015   Time 8   Period Weeks   Status On-going     PT LONG TERM GOAL #2   Title The patient will ambulate 345 ft with RW modified indep for improved endurance.   Baseline Target date 11/16/2015   Time 8   Period Weeks   Status On-going     PT LONG TERM GOAL #3   Title The patient will move sit<>stand x 10 reps without UE support to demo improved LE strength   Baseline Target date 11/16/2015   Time 8   Period Weeks   Status On-going     PT LONG TERM GOAL #4   Title The patient will improve gait speed to > or equal to 1.4 ft/sec (from 0.98 ft/sec today).   Baseline Target date 11/16/2015   Time 8   Period Weeks   Status On-going               Plan - 09/30/15 1146    Clinical Impression Statement The patient is progressing gait distance this week.  She still demonstrates LE weakness and mobility limitations.  Continue working towards [employer].    Rehab Potential Good   PT Frequency 2x / week   PT Duration 8 weeks   PT Treatment/Interventions ADLs/Self Care Home Management;Manual techniques;Therapeutic exercise;Therapeutic activities;Moist Heat;Cryotherapy;Neuromuscular re-education;Gait training;Stair training;DME Instruction;Functional mobility training;Patient/family education   PT Next Visit Plan Progress standing tolerance, gait, upright posture; community resources [employer] and Agree with Plan of Care Patient      Patient will benefit from skilled therapeutic intervention in order to improve the following deficits and impairments:  Abnormal gait, Difficulty walking, Decreased activity tolerance, Decreased  balance, Decreased mobility, Decreased strength, Postural dysfunction, Impaired flexibility, Pain, Decreased endurance  Visit Diagnosis: Pain in left knee  Muscle weakness (generalized)  Other abnormalities of gait and mobility  Pain in  left hip     Problem List Patient Active Problem List   Diagnosis Date Noted  . HLD (hyperlipidemia) 11/21/2014  . Depression 11/21/2014  . Nausea & vomiting 11/21/2014  . Sepsis ([place]) 11/21/2014  . Coughing 11/21/2014  . UTI (lower urinary tract infection) 11/21/2014  . AKI (acute kidney injury) ([place]) 11/21/2014  . Nausea with vomiting   . 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 HNP (herniated nucleus pulposus), cervical 10/15/2011    Class: Chronic  . Diabetes mellitus without complication ([place]) 37/90/2409  . Essential hypertension 09/30/2006  . DILATION AND CURETTAGE, HX OF 09/30/2006    Tonilynn Bieker, PT 09/30/2015, 11:49 AM  [facility] [address] [place], [name], [address] Phone: [phone]   Fax:  [phone]  Name: Jodi Chavez MRN: [id number] Date of Birth: Jan 29, 1951

## 2015-10-03 ENCOUNTER — Ambulatory Visit: Payer: Medicare Other | Admitting: Physical Therapy

## 2015-10-03 DIAGNOSIS — M6281 Muscle weakness (generalized): Secondary | ICD-10-CM

## 2015-10-03 DIAGNOSIS — M25562 Pain in left knee: Secondary | ICD-10-CM

## 2015-10-03 DIAGNOSIS — R2689 Other abnormalities of gait and mobility: Secondary | ICD-10-CM

## 2015-10-03 DIAGNOSIS — M25552 Pain in left hip: Secondary | ICD-10-CM

## 2015-10-03 NOTE — Therapy (Signed)
[facility] [address] [place], [name], [address] Phone: [phone]   Fax:  [phone]  Physical Therapy Treatment  Patient Details  Name: Jodi Chavez MRN: [id number] Date of Birth: [birth year] Referring Provider: Nolene Ebbs, MD  Encounter Date: 10/03/2015      PT End of Session - 10/03/15 1156    Visit Number 22   Number of Visits 28   Date for PT Re-Evaluation 11/18/15   Authorization Type G code every 10th visit   PT Start Time 1110   PT Stop Time 1148   PT Time Calculation (min) 38 min   Equipment Utilized During Treatment Gait belt   Activity Tolerance Patient tolerated treatment well   Behavior During Therapy [facility] for tasks assessed/performed      Past Medical History:  Diagnosis Date  . Arthritis    hip  . Constipation    uses laxatives several times a week  . Depression   . Diabetes mellitus    takes Metformin and Amaryl daily  . Dizziness    occasionally and related to meds   . Early cataracts, bilateral   . History of bronchitis    last time several yrs ago  . History of migraine    many yrs ago  . Hyperlipidemia    takes Zetia and Zocor daily  . Hypertension    takes Benazepril nightly and Propranolol tid and Clonidine daily  . Insomnia   . Low back pain   . Peripheral edema   . Peripheral neuropathy ([place])   . PONV (postoperative nausea and vomiting)   . Slow urinary stream    occasionally    Past Surgical History:  Procedure Laterality Date  . COLONOSCOPY    . d&c/hysteroscopy/ablation    . DILATION AND CURETTAGE OF UTERUS     couple of times  . ESOPHAGOGASTRODUODENOSCOPY    . 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 POSTERIOR CERVICAL FUSION/FORAMINOTOMY  10/15/2011   Procedure: POSTERIOR CERVICAL FUSION/FORAMINOTOMY LEVEL 2;  Surgeon: Jessy Oto, MD;  Location: [facility];  Service: Orthopedics;  Laterality: N/A;  Right C6-7, C7-T1 Foraminotomy with  excision HNP C7-T1  . UPPER GASTROINTESTINAL ENDOSCOPY      There were no vitals filed for this visit.      Subjective Assessment - 10/03/15 1113    Subjective The patient was running late today.  I rested this weekend but did HEP and walked in the home.   Patient Stated Goals Manage pain, exercises for home for strengthening.   Currently in Pain? Yes   Pain Score 5    Pain Location Hip   Pain Orientation Left   Pain Descriptors / Indicators Aching   Pain Type Chronic pain   Pain Onset More than a month ago   Pain Frequency Intermittent   Multiple Pain Sites Yes   Pain Score 2; 5 by the end of the session   Pain Location Knee   Pain Orientation Left   Pain Descriptors / Indicators Aching   Pain Type Chronic pain   Pain Onset More than a month ago   Pain Frequency Intermittent                         [facility] Adult PT Treatment/Exercise - 10/03/15 0001      Bed Mobility   Bed Mobility Rolling Right;Rolling Left;Left Sidelying to Sit;Supine to Sit;Sitting - Scoot to [place] of Bed  Rolling Right 5: Supervision   Rolling Right Details (indicate cue type and reason) Keep knees bent   Rolling Left 6: Modified independent (Device/Increase time);5: Supervision  cues to keep knees bent   Left Sidelying to Sit 5: Supervision     Transfers   Sit to Stand Details (indicate cue type and reason) working on decreased UE support, momentum, and posture x5     Ambulation/Gait   Ambulation/Gait --     Knee/Hip Exercises: Supine   Bridges AROM;Strengthening;Both;20 reps   Straight Leg Raises AROM;Right;Left;Strengthening;20 reps     Knee/Hip Exercises: Sidelying   Clams 2x10 each A lot of compensation strategies, manual cues for body mechanics                PT Education - 10/03/15 1155    Education provided Yes   Education Details LE strengthening and sit<>stand technique   Person(s) Educated Patient   Methods Explanation;Verbal cues;Handout    Comprehension Verbalized understanding;Returned demonstration;Verbal cues required;Need further instruction          PT Short Term Goals - 09/28/15 1118      PT SHORT TERM GOAL #1   Title The patient will tolerate 10 minutes of standing activities before requring rest break.   Baseline Met on 09/28/2015   Time 4   Period Weeks   Status Achieved     PT SHORT TERM GOAL #2   Title The patient will improve stair negotiation with one handrail x 4 steps with supervision to demo dec'ing dependence on bilateral UEs for stair negotiation.   Baseline Target date 10/16/2015   Time 4   Period Weeks   Status On-going     PT SHORT TERM GOAL #3   Title The patient will improve walking speed from 0.98 ft/sec to > or equal to 1.2 ft/sec to demo improving mobility.   Baseline Target date 10/16/2015   Time 4   Period Weeks   Status On-going     PT SHORT TERM GOAL #4   Title The patient will ambulate with RW modified indep x 300 ft nonstop to demo improved tolerance to walking.   Baseline Met on 09/28/2015   Time 4   Period Weeks   Status Achieved           PT Long Term Goals - 09/29/15 1414      PT LONG TERM GOAL #1   Title The patient will be indep with future progression of HEP.   Baseline Target date 11/16/2015   Time 8   Period Weeks   Status On-going     PT LONG TERM GOAL #2   Title The patient will ambulate 345 ft with RW modified indep for improved endurance.   Baseline Target date 11/16/2015   Time 8   Period Weeks   Status On-going     PT LONG TERM GOAL #3   Title The patient will move sit<>stand x 10 reps without UE support to demo improved LE strength   Baseline Target date 11/16/2015   Time 8   Period Weeks   Status On-going     PT LONG TERM GOAL #4   Title The patient will improve gait speed to > or equal to 1.4 ft/sec (from 0.98 ft/sec today).   Baseline Target date 11/16/2015   Time 8   Period Weeks   Status On-going               Plan - 10/03/15 1157  Clinical Impression Statement Pt has continued significant weakness in bilateral LEs and ROM and exercise tolerance is limited by Left knee and hip pain.  Practiced sit<>stands with decreased UE support; pt tolerated with 1 UE support x5.   Rehab Potential Good   PT Frequency 2x / week   PT Duration 8 weeks   PT Treatment/Interventions ADLs/Self Care Home Management;Manual techniques;Therapeutic exercise;Therapeutic activities;Moist Heat;Cryotherapy;Neuromuscular re-education;Gait training;Stair training;DME Instruction;Functional mobility training;Patient/family education   PT Next Visit Plan Progress standing tolerance, gait, upright posture; community resources [employer] and Agree with Plan of Care Patient      Patient will benefit from skilled therapeutic intervention in order to improve the following deficits and impairments:  Abnormal gait, Difficulty walking, Decreased activity tolerance, Decreased balance, Decreased mobility, Decreased strength, Postural dysfunction, Impaired flexibility, Pain, Decreased endurance  Visit Diagnosis: Pain in left knee  Muscle weakness (generalized)  Other abnormalities of gait and mobility  Pain in left hip     Problem List Patient Active Problem List   Diagnosis Date Noted  . HLD (hyperlipidemia) 11/21/2014  . Depression 11/21/2014  . Nausea & vomiting 11/21/2014  . Sepsis ([place]) 11/21/2014  . Coughing 11/21/2014  . UTI (lower urinary tract infection) 11/21/2014  . AKI (acute kidney injury) ([place]) 11/21/2014  . Nausea with vomiting   . 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 HNP (herniated nucleus pulposus), cervical 10/15/2011    Class: Chronic  . Diabetes mellitus without complication ([place]) 31/59/4585  . Essential hypertension 09/30/2006  . DILATION AND CURETTAGE, HX OF 09/30/2006    Bjorn Loser, PTA  10/03/15, 12:01 PM [facility] [address] [place], [name], [address] Phone: [phone]   Fax:  [phone]  Name: Jodi Chavez MRN: [id number] Date of Birth: 1950-03-24

## 2015-10-06 ENCOUNTER — Ambulatory Visit: Payer: Medicare Other | Admitting: Rehabilitative and Restorative Service Providers"

## 2015-10-06 DIAGNOSIS — R2689 Other abnormalities of gait and mobility: Secondary | ICD-10-CM

## 2015-10-06 DIAGNOSIS — M25562 Pain in left knee: Secondary | ICD-10-CM | POA: Diagnosis not present

## 2015-10-06 DIAGNOSIS — M6281 Muscle weakness (generalized): Secondary | ICD-10-CM

## 2015-10-06 NOTE — Patient Instructions (Signed)
KNEE: Extension, Long Arc Quads - Sitting    Raise leg until knee is straight.  Hold 3 seconds. _10__ reps per set, __2_ sets per day. Copyright  VHI. All rights reserved.   ABDUCTION: Sitting (Active)    Sit with feet flat. Lift right leg slightly and draw it out to side. No weights for now. Complete _1__ sets of _8-10__ repetitions. Perform _2__ sessions per day.  Copyright  VHI. All rights reserved.   Heel Raise: Bilateral (Standing)    HOLD ONTO WALKER IN STANDING.  Rise on balls of feet. Repeat __10__ times per set. Do __1__ sets per session. Do __2__ sessions per day.  http://orth.exer.us/39   Copyright  VHI. All rights reserved.  Hamstring Stretch (Sitting)    Sitting, extend one leg and place hands on same thigh for support. Keeping torso straight, lean forward, sliding hands down leg, until a stretch is felt in back of thigh. Hold __30__ seconds. Repeat with other leg.  Copyright  VHI. All rights reserved.   High Stepping in Place (Sitting)    Sitting, alternately lift knees as high as possible. Keep torso erect. Repeat __5-10__ times, each leg.  Copyright  VHI. All rights reserved.

## 2015-10-06 NOTE — Therapy (Signed)
[facility] [address] [place], [name], [address] Phone: [phone]   Fax:  [phone]  Physical Therapy Treatment  Patient Details  Name: Jodi Chavez MRN: [id number] Date of Birth: 1950-05-26 Referring Provider: Nolene Ebbs, MD  Encounter Date: 10/06/2015      PT End of Session - 10/06/15 1413    Visit Number 23   Number of Visits 28   Date for PT Re-Evaluation 11/18/15   Authorization Type G code every 10th visit   PT Start Time 1323   PT Stop Time 1418   PT Time Calculation (min) 55 min   Activity Tolerance Patient tolerated treatment well   Behavior During Therapy [facility] for tasks assessed/performed      Past Medical History:  Diagnosis Date  . Arthritis    hip  . Constipation    uses laxatives several times a week  . Depression   . Diabetes mellitus    takes Metformin and Amaryl daily  . Dizziness    occasionally and related to meds   . Early cataracts, bilateral   . History of bronchitis    last time several yrs ago  . History of migraine    many yrs ago  . Hyperlipidemia    takes Zetia and Zocor daily  . Hypertension    takes Benazepril nightly and Propranolol tid and Clonidine daily  . Insomnia   . Low back pain   . Peripheral edema   . Peripheral neuropathy ([place])   . PONV (postoperative nausea and vomiting)   . Slow urinary stream    occasionally    Past Surgical History:  Procedure Laterality Date  . COLONOSCOPY    . d&c/hysteroscopy/ablation    . DILATION AND CURETTAGE OF UTERUS     couple of times  . ESOPHAGOGASTRODUODENOSCOPY    . 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 POSTERIOR CERVICAL FUSION/FORAMINOTOMY  10/15/2011   Procedure: POSTERIOR CERVICAL FUSION/FORAMINOTOMY LEVEL 2;  Surgeon: Jessy Oto, MD;  Location: [facility];  Service: Orthopedics;  Laterality: N/A;  Right C6-7, C7-T1 Foraminotomy with excision HNP C7-T1  . UPPER GASTROINTESTINAL  ENDOSCOPY      There were no vitals filed for this visit.      Subjective Assessment - 10/06/15 1327    Subjective The patient reports she was sore after PT last visit.  She has not started helping with ADLs at home (like folding clothes, or washing dishes).   Patient Stated Goals Manage pain, exercises for home for strengthening.   Currently in Pain? Yes   Pain Score --  none at rest, pain after exercise   Pain Location Hip                         [facility] Adult PT Treatment/Exercise - 10/06/15 1340      Ambulation/Gait   Ambulation/Gait Yes   Ambulation/Gait Assistance 6: Modified independent (Device/Increase time)   Ambulation Distance (Feet) 230 Feet, 100 feet, x 3 reps, 130 feet   Assistive device Rolling walker   Gait Comments Cues for postural upright and reciprocal pattern of gait.      Self-Care   Self-Care Other Self-Care Comments   Other Self-Care Comments  The patient and PT discussed progression to community exercises and PT provided contact information for [employer].  The patient wants to continue PT for longer.  We discussed plan of care to reduce to 1x/week over  next few weeks in order to progress to long term program.  Discussed patient's role in continuing to progress activities.  She doesn't feel she can manage machines on her own.  PT had her set herself up on sci-fit stepper with instruction/supervision today.  Plan to work on [place] her comfort with exercise.  Also reprinted and verbally reviewed prior HEP for LEs provided weeks ago.       Exercises   Exercises Other Exercises   Other Exercises  Standing exercises in the parallel bars performing sidestepping with UE support, standing heel raises x 10 reps, standing toe raises x 10 reps, standing mini squats x 10 reps.  Also performed hamstring stretching in standing while holding onto parallel bars, stretching hamstrings (modified down dog position), standing trunk rotation holding UEs for  support.  Sci-fit x 10 minutes with UE/LE (while performing self care for community activities--charged with self care).                 PT Education - 10/06/15 1703    Education provided Yes   Education Details reprinted prior HEP; encouraging increased activity in the home.   Person(s) Educated Patient   Methods Explanation;Demonstration;Handout   Comprehension Verbalized understanding;Returned demonstration          PT Short Term Goals - 09/28/15 1118      PT SHORT TERM GOAL #1   Title The patient will tolerate 10 minutes of standing activities before requring rest break.   Baseline Met on 09/28/2015   Time 4   Period Weeks   Status Achieved     PT SHORT TERM GOAL #2   Title The patient will improve stair negotiation with one handrail x 4 steps with supervision to demo dec'ing dependence on bilateral UEs for stair negotiation.   Baseline Target date 10/16/2015   Time 4   Period Weeks   Status On-going     PT SHORT TERM GOAL #3   Title The patient will improve walking speed from 0.98 ft/sec to > or equal to 1.2 ft/sec to demo improving mobility.   Baseline Target date 10/16/2015   Time 4   Period Weeks   Status On-going     PT SHORT TERM GOAL #4   Title The patient will ambulate with RW modified indep x 300 ft nonstop to demo improved tolerance to walking.   Baseline Met on 09/28/2015   Time 4   Period Weeks   Status Achieved           PT Long Term Goals - 09/29/15 1414      PT LONG TERM GOAL #1   Title The patient will be indep with future progression of HEP.   Baseline Target date 11/16/2015   Time 8   Period Weeks   Status On-going     PT LONG TERM GOAL #2   Title The patient will ambulate 345 ft with RW modified indep for improved endurance.   Baseline Target date 11/16/2015   Time 8   Period Weeks   Status On-going     PT LONG TERM GOAL #3   Title The patient will move sit<>stand x 10 reps without UE support to demo improved LE strength    Baseline Target date 11/16/2015   Time 8   Period Weeks   Status On-going     PT LONG TERM GOAL #4   Title The patient will improve gait speed to > or equal to 1.4 ft/sec (from 0.98 ft/sec today).  Baseline Target date 11/16/2015   Time 8   Period Weeks   Status On-going               Plan - 10/06/15 1704    Clinical Impression Statement The patient tolerated increased standing activities today for LE strengthening/posture.  PT and patient reviewed plan of care and emphasized goal of her transitioning to community program to continue to exercise post d/c from PT.  Encouraged patient to check into [employer] and provided contact information.  Plan to continue working towards STGs/LTGS.    PT Treatment/Interventions ADLs/Self Care Home Management;Manual techniques;Therapeutic exercise;Therapeutic activities;Moist Heat;Cryotherapy;Neuromuscular re-education;Gait training;Stair training;DME Instruction;Functional mobility training;Patient/family education   PT Next Visit Plan Progress standing tolerance/posture, LE strengthening, stair negotiation, ambulation distance for community activities.  Encourage performance of ADLs at home.   Consulted and Agree with Plan of Care Patient      Patient will benefit from skilled therapeutic intervention in order to improve the following deficits and impairments:  Abnormal gait, Difficulty walking, Decreased activity tolerance, Decreased balance, Decreased mobility, Decreased strength, Postural dysfunction, Impaired flexibility, Pain, Decreased endurance  Visit Diagnosis: Pain in left knee  Muscle weakness (generalized)  Other abnormalities of gait and mobility     Problem List Patient Active Problem List   Diagnosis Date Noted  . HLD (hyperlipidemia) 11/21/2014  . Depression 11/21/2014  . Nausea & vomiting 11/21/2014  . Sepsis ([place]) 11/21/2014  . Coughing 11/21/2014  . UTI (lower urinary tract infection) 11/21/2014  . AKI (acute kidney  injury) ([place]) 11/21/2014  . Nausea with vomiting   . 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 HNP (herniated nucleus pulposus), cervical 10/15/2011    Class: Chronic  . Diabetes mellitus without complication ([place]) 12/45/8099  . Essential hypertension 09/30/2006  . DILATION AND CURETTAGE, HX OF 09/30/2006    Lareta Bruneau, PT 10/06/2015, 5:06 PM  [facility] [address] [place], [name], [address] Phone: [phone]   Fax:  [phone]  Name: Jodi Chavez MRN: [id number] Date of Birth: 12/25/1950

## 2015-10-10 ENCOUNTER — Ambulatory Visit: Payer: Medicare Other | Admitting: Physical Therapy

## 2015-10-10 DIAGNOSIS — R2689 Other abnormalities of gait and mobility: Secondary | ICD-10-CM

## 2015-10-10 DIAGNOSIS — M25562 Pain in left knee: Secondary | ICD-10-CM | POA: Diagnosis not present

## 2015-10-10 DIAGNOSIS — M6281 Muscle weakness (generalized): Secondary | ICD-10-CM

## 2015-10-10 NOTE — Therapy (Signed)
[facility] [address] [place], [name], [address] Phone: [phone]   Fax:  [phone]  Physical Therapy Treatment  Patient Details  Name: Jodi Chavez MRN: [id number] Date of Birth: August 04, 1950 Referring Provider: Nolene Ebbs, MD  Encounter Date: 10/10/2015      PT End of Session - 10/10/15 1445    Visit Number 24   Number of Visits 28   Date for PT Re-Evaluation 11/18/15   Authorization Type G code every 10th visit   PT Start Time 1405   PT Stop Time 1445   PT Time Calculation (min) 40 min   Activity Tolerance Patient tolerated treatment well   Behavior During Therapy [facility] for tasks assessed/performed      Past Medical History:  Diagnosis Date  . Arthritis    hip  . Constipation    uses laxatives several times a week  . Depression   . Diabetes mellitus    takes Metformin and Amaryl daily  . Dizziness    occasionally and related to meds   . Early cataracts, bilateral   . History of bronchitis    last time several yrs ago  . History of migraine    many yrs ago  . Hyperlipidemia    takes Zetia and Zocor daily  . Hypertension    takes Benazepril nightly and Propranolol tid and Clonidine daily  . Insomnia   . Low back pain   . Peripheral edema   . Peripheral neuropathy ([place])   . PONV (postoperative nausea and vomiting)   . Slow urinary stream    occasionally    Past Surgical History:  Procedure Laterality Date  . COLONOSCOPY    . d&c/hysteroscopy/ablation    . DILATION AND CURETTAGE OF UTERUS     couple of times  . ESOPHAGOGASTRODUODENOSCOPY    . 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 POSTERIOR CERVICAL FUSION/FORAMINOTOMY  10/15/2011   Procedure: POSTERIOR CERVICAL FUSION/FORAMINOTOMY LEVEL 2;  Surgeon: Jessy Oto, MD;  Location: [facility];  Service: Orthopedics;  Laterality: N/A;  Right C6-7, C7-T1 Foraminotomy with excision HNP C7-T1  . UPPER GASTROINTESTINAL  ENDOSCOPY      There were no vitals filed for this visit.      Subjective Assessment - 10/10/15 1412    Subjective Reports performing HEP since last visit.   Patient Stated Goals Manage pain, exercises for home for strengthening.   Currently in Pain? Yes   Pain Score 5    Pain Location Knee   Pain Orientation Left   Pain Descriptors / Indicators Aching   Pain Type Chronic pain   Pain Onset More than a month ago   Pain Frequency Intermittent                         [facility] Adult PT Treatment/Exercise - 10/10/15 0001      Ambulation/Gait   Ambulation/Gait Yes   Ambulation/Gait Assistance 6: Modified independent (Device/Increase time)   Ambulation/Gait Assistance Details Working on activity tolerance   Ambulation Distance (Feet) 300 Feet   Assistive device Rolling walker   Gait Pattern Decreased stride length;Trunk flexed;Narrow base of support   Ambulation Surface Level   Gait Comments multiple standing stops to relax UEs due to extensive UE support.     Knee/Hip Exercises: Aerobic   Other Aerobic Sci Fit L= 1.5-2.0, 14 min  pt managed exercise seat and getting LE on to foot pads  with min cues     Knee/Hip Exercises: Standing   Heel Raises Both;10 reps   Wall Squat Limitations Mini squats             Balance Exercises - 10/10/15 1443      Balance Exercises: Standing   Sidestepping Upper extremity support  Progressing from 2-1 UEsupport             PT Short Term Goals - 09/28/15 1118      PT SHORT TERM GOAL #1   Title The patient will tolerate 10 minutes of standing activities before requring rest break.   Baseline Met on 09/28/2015   Time 4   Period Weeks   Status Achieved     PT SHORT TERM GOAL #2   Title The patient will improve stair negotiation with one handrail x 4 steps with supervision to demo dec'ing dependence on bilateral UEs for stair negotiation.   Baseline Target date 10/16/2015   Time 4   Period Weeks   Status On-going      PT SHORT TERM GOAL #3   Title The patient will improve walking speed from 0.98 ft/sec to > or equal to 1.2 ft/sec to demo improving mobility.   Baseline Target date 10/16/2015   Time 4   Period Weeks   Status On-going     PT SHORT TERM GOAL #4   Title The patient will ambulate with RW modified indep x 300 ft nonstop to demo improved tolerance to walking.   Baseline Met on 09/28/2015   Time 4   Period Weeks   Status Achieved           PT Long Term Goals - 09/29/15 1414      PT LONG TERM GOAL #1   Title The patient will be indep with future progression of HEP.   Baseline Target date 11/16/2015   Time 8   Period Weeks   Status On-going     PT LONG TERM GOAL #2   Title The patient will ambulate 345 ft with RW modified indep for improved endurance.   Baseline Target date 11/16/2015   Time 8   Period Weeks   Status On-going     PT LONG TERM GOAL #3   Title The patient will move sit<>stand x 10 reps without UE support to demo improved LE strength   Baseline Target date 11/16/2015   Time 8   Period Weeks   Status On-going     PT LONG TERM GOAL #4   Title The patient will improve gait speed to > or equal to 1.4 ft/sec (from 0.98 ft/sec today).   Baseline Target date 11/16/2015   Time 8   Period Weeks   Status On-going               Plan - 10/10/15 1423    Clinical Impression Statement Reviewed/ performed Sci fit to simulate gym environment and worked in increasing speed and resistence to a challenging level.  Progressed gait distance to 300 ft.   PT Treatment/Interventions ADLs/Self Care Home Management;Manual techniques;Therapeutic exercise;Therapeutic activities;Moist Heat;Cryotherapy;Neuromuscular re-education;Gait training;Stair training;DME Instruction;Functional mobility training;Patient/family education   PT Next Visit Plan Progress standing tolerance/posture, (sidestepping with decreased support) LE strengthening, stair negotiation, ambulation distance for  community activities.  Encourage performance of ADLs at home.   Consulted and Agree with Plan of Care Patient      Patient will benefit from skilled therapeutic intervention in order to improve the following deficits and impairments:  Abnormal gait, Difficulty walking, Decreased activity tolerance, Decreased balance, Decreased mobility, Decreased strength, Postural dysfunction, Impaired flexibility, Pain, Decreased endurance  Visit Diagnosis: Pain in left knee  Muscle weakness (generalized)  Other abnormalities of gait and mobility     Problem List Patient Active Problem List   Diagnosis Date Noted  . HLD (hyperlipidemia) 11/21/2014  . Depression 11/21/2014  . Nausea & vomiting 11/21/2014  . Sepsis ([place]) 11/21/2014  . Coughing 11/21/2014  . UTI (lower urinary tract infection) 11/21/2014  . AKI (acute kidney injury) ([place]) 11/21/2014  . Nausea with vomiting   . 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 HNP (herniated nucleus pulposus), cervical 10/15/2011    Class: Chronic  . Diabetes mellitus without complication ([place]) 35/00/9381  . Essential hypertension 09/30/2006  . DILATION AND CURETTAGE, HX OF 09/30/2006    Bjorn Loser, PTA  10/10/15, 3:49 PM [facility] [address] [place], [name], [address] Phone: [phone]   Fax:  [phone]  Name: Jodi Chavez MRN: [id number] Date of Birth: 1950/11/09

## 2015-10-12 ENCOUNTER — Ambulatory Visit (INDEPENDENT_AMBULATORY_CARE_PROVIDER_SITE_OTHER): Payer: Medicare Other | Admitting: Podiatry

## 2015-10-12 ENCOUNTER — Encounter: Payer: Self-pay | Admitting: Podiatry

## 2015-10-12 DIAGNOSIS — B351 Tinea unguium: Secondary | ICD-10-CM | POA: Diagnosis not present

## 2015-10-12 DIAGNOSIS — M79676 Pain in unspecified toe(s): Secondary | ICD-10-CM

## 2015-10-12 NOTE — Patient Instructions (Signed)
Diabetes and Foot Care Diabetes may cause you to have problems because of poor blood supply (circulation) to your feet and legs. This may cause the skin on your feet to become thinner, break easier, and heal more slowly. Your skin may become dry, and the skin may peel and crack. You may also have nerve damage in your legs and feet causing decreased feeling in them. You may not notice minor injuries to your feet that could lead to infections or more serious problems. Taking care of your feet is one of the most important things you can do for yourself.  HOME CARE INSTRUCTIONS  Wear shoes at all times, even in the house. Do not go barefoot. Bare feet are easily injured.  Check your feet daily for blisters, cuts, and redness. If you cannot see the bottom of your feet, use a mirror or ask someone for help.  Wash your feet with warm water (do not use hot water) and mild soap. Then pat your feet and the areas between your toes until they are completely dry. Do not soak your feet as this can dry your skin.  Apply a moisturizing lotion or petroleum jelly (that does not contain alcohol and is unscented) to the skin on your feet and to dry, brittle toenails. Do not apply lotion between your toes.  Trim your toenails straight across. Do not dig under them or around the cuticle. File the edges of your nails with an emery board or nail file.  Do not cut corns or calluses or try to remove them with medicine.  Wear clean socks or stockings every day. Make sure they are not too tight. Do not wear knee-high stockings since they may decrease blood flow to your legs.  Wear shoes that fit properly and have enough cushioning. To break in new shoes, wear them for just a few hours a day. This prevents you from injuring your feet. Always look in your shoes before you put them on to be sure there are no objects inside.  Do not cross your legs. This may decrease the blood flow to your feet.  If you find a minor scrape,  cut, or break in the skin on your feet, keep it and the skin around it clean and dry. These areas may be cleansed with mild soap and water. Do not cleanse the area with peroxide, alcohol, or iodine.  When you remove an adhesive bandage, be sure not to damage the skin around it.  If you have a wound, look at it several times a day to make sure it is healing.  Do not use heating pads or hot water bottles. They may burn your skin. If you have lost feeling in your feet or legs, you may not know it is happening until it is too late.  Make sure your health care provider performs a complete foot exam at least annually or more often if you have foot problems. Report any cuts, sores, or bruises to your health care provider immediately. SEEK MEDICAL CARE IF:   You have an injury that is not healing.  You have cuts or breaks in the skin.  You have an ingrown nail.  You notice redness on your legs or feet.  You feel burning or tingling in your legs or feet.  You have pain or cramps in your legs and feet.  Your legs or feet are numb.  Your feet always feel cold. SEEK IMMEDIATE MEDICAL CARE IF:   There is increasing redness,   swelling, or pain in or around a wound.  There is a red line that goes up your leg.  Pus is coming from a wound.  You develop a fever or as directed by your health care provider.  You notice a bad smell coming from an ulcer or wound.   This information is not intended to replace advice given to you by your health care provider. Make sure you discuss any questions you have with your health care provider.   Document Released: 02/10/2000 Document Revised: 10/15/2012 Document Reviewed: 07/22/2012 Elsevier Interactive Patient Education 2016 [employer] Inc.  

## 2015-10-13 ENCOUNTER — Ambulatory Visit: Payer: Medicare Other | Admitting: Physical Therapy

## 2015-10-13 DIAGNOSIS — M6281 Muscle weakness (generalized): Secondary | ICD-10-CM

## 2015-10-13 DIAGNOSIS — M25562 Pain in left knee: Secondary | ICD-10-CM

## 2015-10-13 DIAGNOSIS — R2689 Other abnormalities of gait and mobility: Secondary | ICD-10-CM

## 2015-10-13 NOTE — Therapy (Signed)
[facility] [address] [place], [name], [address] Phone: [phone]   Fax:  [phone]  Physical Therapy Treatment  Patient Details  Name: Jodi Chavez MRN: [id number] Date of Birth: 10/28/1950 Referring Provider: Nolene Ebbs, MD  Encounter Date: 10/13/2015      PT End of Session - 10/13/15 1543    Visit Number 25   Number of Visits 28   Date for PT Re-Evaluation 11/18/15   Authorization Type G code every 10th visit   PT Start Time 1452   PT Stop Time 1530   PT Time Calculation (min) 38 min   Activity Tolerance Patient tolerated treatment well   Behavior During Therapy [facility] for tasks assessed/performed      Past Medical History:  Diagnosis Date  . Arthritis    hip  . Constipation    uses laxatives several times a week  . Depression   . Diabetes mellitus    takes Metformin and Amaryl daily  . Dizziness    occasionally and related to meds   . Early cataracts, bilateral   . History of bronchitis    last time several yrs ago  . History of migraine    many yrs ago  . Hyperlipidemia    takes Zetia and Zocor daily  . Hypertension    takes Benazepril nightly and Propranolol tid and Clonidine daily  . Insomnia   . Low back pain   . Peripheral edema   . Peripheral neuropathy ([place])   . PONV (postoperative nausea and vomiting)   . Slow urinary stream    occasionally    Past Surgical History:  Procedure Laterality Date  . COLONOSCOPY    . d&c/hysteroscopy/ablation    . DILATION AND CURETTAGE OF UTERUS     couple of times  . ESOPHAGOGASTRODUODENOSCOPY    . 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 POSTERIOR CERVICAL FUSION/FORAMINOTOMY  10/15/2011   Procedure: POSTERIOR CERVICAL FUSION/FORAMINOTOMY LEVEL 2;  Surgeon: Jessy Oto, MD;  Location: [place];  Service: Orthopedics;  Laterality: N/A;  Right C6-7, C7-T1 Foraminotomy with excision HNP C7-T1  . UPPER GASTROINTESTINAL  ENDOSCOPY      There were no vitals filed for this visit.      Subjective Assessment - 10/13/15 1455    Subjective No falls.   Currently in Pain? Yes   Pain Score 5    Pain Location Knee   Pain Orientation Left   Pain Descriptors / Indicators Aching   Pain Onset More than a month ago   Pain Frequency Intermittent                         [facility] Adult PT Treatment/Exercise - 10/13/15 0001      Ambulation/Gait   Ambulation/Gait Yes   Ambulation/Gait Assistance 6: Modified independent (Device/Increase time)   Ambulation/Gait Assistance Details working on visual scanning tasks   Ambulation Distance (Feet) 200 Feet  x2   Assistive device Rolling walker   Gait Pattern Decreased stride length;Trunk flexed;Narrow base of support; multiple standing rest breaks to relax arms   Ambulation Surface Level     Knee/Hip Exercises: Aerobic   Other Aerobic Sci Fit L= 2.0 to 1.0 due to knee pain 14 min             Balance Exercises - 10/13/15 1539      Balance Exercises: Standing   Standing Eyes Opened Wide (BOA);Head turns;Other (  comment)  lateral hip sway- working on maintianing upright trunk witho             PT Short Term Goals - 09/28/15 1118      PT SHORT TERM GOAL #1   Title The patient will tolerate 10 minutes of standing activities before requring rest break.   Baseline Met on 09/28/2015   Time 4   Period Weeks   Status Achieved     PT SHORT TERM GOAL #2   Title The patient will improve stair negotiation with one handrail x 4 steps with supervision to demo dec'ing dependence on bilateral UEs for stair negotiation.   Baseline Target date 10/16/2015   Time 4   Period Weeks   Status On-going     PT SHORT TERM GOAL #3   Title The patient will improve walking speed from 0.98 ft/sec to > or equal to 1.2 ft/sec to demo improving mobility.   Baseline Target date 10/16/2015   Time 4   Period Weeks   Status On-going     PT SHORT TERM GOAL #4   Title  The patient will ambulate with RW modified indep x 300 ft nonstop to demo improved tolerance to walking.   Baseline Met on 09/28/2015   Time 4   Period Weeks   Status Achieved           PT Long Term Goals - 09/29/15 1414      PT LONG TERM GOAL #1   Title The patient will be indep with future progression of HEP.   Baseline Target date 11/16/2015   Time 8   Period Weeks   Status On-going     PT LONG TERM GOAL #2   Title The patient will ambulate 345 ft with RW modified indep for improved endurance.   Baseline Target date 11/16/2015   Time 8   Period Weeks   Status On-going     PT LONG TERM GOAL #3   Title The patient will move sit<>stand x 10 reps without UE support to demo improved LE strength   Baseline Target date 11/16/2015   Time 8   Period Weeks   Status On-going     PT LONG TERM GOAL #4   Title The patient will improve gait speed to > or equal to 1.4 ft/sec (from 0.98 ft/sec today).   Baseline Target date 11/16/2015   Time 8   Period Weeks   Status On-going               Plan - 10/13/15 1513    Clinical Impression Statement Pt seems to tolerate lower resistence level due to Left knee pain on Sci Fit.  Pt continues to use extensive UE support on walker during gait. Pt demonstrates fatigue and soreness in low back quickly when standing without UE support.   PT Treatment/Interventions ADLs/Self Care Home Management;Manual techniques;Therapeutic exercise;Therapeutic activities;Moist Heat;Cryotherapy;Neuromuscular re-education;Gait training;Stair training;DME Instruction;Functional mobility training;Patient/family education   PT Next Visit Plan Progress standing tolerance/posture, (sidestepping with decreased support) LE strengthening, stair negotiation, ambulation distance for community activities.  Encourage performance of ADLs at home.   Consulted and Agree with Plan of Care Patient      Patient will benefit from skilled therapeutic intervention in order to  improve the following deficits and impairments:  Abnormal gait, Difficulty walking, Decreased activity tolerance, Decreased balance, Decreased mobility, Decreased strength, Postural dysfunction, Impaired flexibility, Pain, Decreased endurance  Visit Diagnosis: Pain in left knee  Muscle weakness (generalized)  Other  abnormalities of gait and mobility     Problem List Patient Active Problem List   Diagnosis Date Noted  . HLD (hyperlipidemia) 11/21/2014  . Depression 11/21/2014  . Nausea & vomiting 11/21/2014  . Sepsis ([place]) 11/21/2014  . Coughing 11/21/2014  . UTI (lower urinary tract infection) 11/21/2014  . AKI (acute kidney injury) ([place]) 11/21/2014  . Nausea with vomiting   . 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 HNP (herniated nucleus pulposus), cervical 10/15/2011    Class: Chronic  . Diabetes mellitus without complication ([place]) 74/14/2395  . Essential hypertension 09/30/2006  . DILATION AND CURETTAGE, HX OF 09/30/2006    Bjorn Loser, PTA  10/13/15, 3:51 PM [facility] [address] [place], [name], [address] Phone: [phone]   Fax:  [phone]  Name: Jodi Chavez MRN: [id number] Date of Birth: 1950-04-07

## 2015-10-13 NOTE — Progress Notes (Signed)
Patient ID: Karel JarvisDorcas P Cordner, female   DOB: 1950/11/06, 65 y.o.   MRN: [id number]    Subjective: This patient presents today complaining of toenails that are thickened and elongated which or cough walking wearing shoes and requests nail debridement. Also patient complaining of a uncomfortable callus on the plantar right foot  Objective: Orientated 3 No open skin lesions bilaterally DP and PT pulses 2/4 bilaterally Capillary reflex immediate bilaterally Sensation to 10 g monofilament wire intact 5/5 bilaterally Vibratory sensation reactive bilaterally Ankle reflex equal and reactive bilaterally Toenails 8 are elongated, hypertrophic, discolored and tender direct palpation Keratoses medial plantar first MPJ right  Assessment: Satisfactory neurovascular status Protective sensation intact Symptomatic onychomycose x 8 Keratoses 1  Plan: Debridement toenails 8 mechanically an electrically without any bleeding Debrided keratoses 1 without any bleeding  Reappoint 3 months

## 2015-10-19 ENCOUNTER — Ambulatory Visit: Payer: Medicare Other | Admitting: Rehabilitative and Restorative Service Providers"

## 2015-10-19 DIAGNOSIS — M25562 Pain in left knee: Secondary | ICD-10-CM | POA: Diagnosis not present

## 2015-10-19 DIAGNOSIS — R2689 Other abnormalities of gait and mobility: Secondary | ICD-10-CM

## 2015-10-19 DIAGNOSIS — M25552 Pain in left hip: Secondary | ICD-10-CM

## 2015-10-19 DIAGNOSIS — M6281 Muscle weakness (generalized): Secondary | ICD-10-CM

## 2015-10-19 NOTE — Therapy (Signed)
[facility] [address] [place], [name], [address] Phone: [phone]   Fax:  [phone]  Physical Therapy Treatment  Patient Details  Name: Jodi Chavez MRN: [id number] Date of Birth: [birth year] Referring Provider: Nolene Ebbs, MD  Encounter Date: 10/19/2015      PT End of Session - 10/19/15 1546    Visit Number 26   Number of Visits 28   Date for PT Re-Evaluation 11/18/15   Authorization Type G code every 10th visit   PT Start Time 1415   PT Stop Time 1500   PT Time Calculation (min) 45 min   Equipment Utilized During Treatment Gait belt   Activity Tolerance Patient tolerated treatment well   Behavior During Therapy [facility] for tasks assessed/performed      Past Medical History:  Diagnosis Date  . Arthritis    hip  . Constipation    uses laxatives several times a week  . Depression   . Diabetes mellitus    takes Metformin and Amaryl daily  . Dizziness    occasionally and related to meds   . Early cataracts, bilateral   . History of bronchitis    last time several yrs ago  . History of migraine    many yrs ago  . Hyperlipidemia    takes Zetia and Zocor daily  . Hypertension    takes Benazepril nightly and Propranolol tid and Clonidine daily  . Insomnia   . Low back pain   . Peripheral edema   . Peripheral neuropathy ([place])   . PONV (postoperative nausea and vomiting)   . Slow urinary stream    occasionally    Past Surgical History:  Procedure Laterality Date  . COLONOSCOPY    . d&c/hysteroscopy/ablation    . DILATION AND CURETTAGE OF UTERUS     couple of times  . ESOPHAGOGASTRODUODENOSCOPY    . 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 POSTERIOR CERVICAL FUSION/FORAMINOTOMY  10/15/2011   Procedure: POSTERIOR CERVICAL FUSION/FORAMINOTOMY LEVEL 2;  Surgeon: Jessy Oto, MD;  Location: [place];  Service: Orthopedics;  Laterality: N/A;  Right C6-7, C7-T1 Foraminotomy  with excision HNP C7-T1  . UPPER GASTROINTESTINAL ENDOSCOPY      There were no vitals filed for this visit.      Subjective Assessment - 10/19/15 1426    Subjective The patient reports that she is working on her weight loss.  She called [employer] and thinks she could join in September.  She reports she is doing some exercise in the home.    Patient Stated Goals Manage pain, exercises for home for strengthening.   Currently in Pain? No/denies   Pain Score --  none at rest, 5/10 walking                         [facility] Adult PT Treatment/Exercise - 10/19/15 1444      Ambulation/Gait   Ambulation/Gait Yes   Ambulation/Gait Assistance 6: Modified independent (Device/Increase time)   Ambulation Distance (Feet) 290 Feet   Stairs Yes   Stairs Assistance 5: Supervision   Stair Management Technique Two rails;Step to pattern   Number of Stairs 4     Self-Care   Self-Care Other Self-Care Comments   Other Self-Care Comments  Discussed need for carryover of activities to community in order to improve long term outcomes.  Discussed moving more throughout the day.       Exercises  Exercises Other Exercises   Other Exercises  Standing in parallel bars performing heel raises x 20 reps, toe raises x 10 reps, side stepping 10 feet with one UE support R and L x 2 reps, ant/post weight shifting with feet in stride x 10 reps each foot forward, knee flexion standing x 10 reps R and L sides.  Long arc quads seated x 10 reps R and L sides.                   PT Short Term Goals - 10/19/15 1500      PT SHORT TERM GOAL #1   Title The patient will tolerate 10 minutes of standing activities before requring rest break.   Baseline Met on 09/28/2015   Time 4   Period Weeks   Status Achieved     PT SHORT TERM GOAL #2   Title The patient will improve stair negotiation with one handrail x 4 steps with supervision to demo dec'ing dependence on bilateral UEs for stair negotiation.    Baseline Patient requires use of 2 rails, but can perform with supervision.    Time 4   Period Weeks   Status Partially Met     PT SHORT TERM GOAL #3   Title The patient will improve walking speed from 0.98 ft/sec to > or equal to 1.2 ft/sec to demo improving mobility.   Baseline Met on 10/19/2015 with 1.27 ft/sec with RW modified indep.    Time 4   Period Weeks   Status Achieved     PT SHORT TERM GOAL #4   Title The patient will ambulate with RW modified indep x 300 ft nonstop to demo improved tolerance to walking.   Baseline Met on 09/28/2015   Time 4   Period Weeks   Status Achieved           PT Long Term Goals - 09/29/15 1414      PT LONG TERM GOAL #1   Title The patient will be indep with future progression of HEP.   Baseline Target date 11/16/2015   Time 8   Period Weeks   Status On-going     PT LONG TERM GOAL #2   Title The patient will ambulate 345 ft with RW modified indep for improved endurance.   Baseline Target date 11/16/2015   Time 8   Period Weeks   Status On-going     PT LONG TERM GOAL #3   Title The patient will move sit<>stand x 10 reps without UE support to demo improved LE strength   Baseline Target date 11/16/2015   Time 8   Period Weeks   Status On-going     PT LONG TERM GOAL #4   Title The patient will improve gait speed to > or equal to 1.4 ft/sec (from 0.98 ft/sec today).   Baseline Target date 11/16/2015   Time 8   Period Weeks   Status On-going               Plan - 10/19/15 1548    Clinical Impression Statement The patient has met further STG for stair negotiation.  PT to decrease frequency to 1x/week in order to allow patient to work on transition to community for long term success.     PT Treatment/Interventions ADLs/Self Care Home Management;Manual techniques;Therapeutic exercise;Therapeutic activities;Moist Heat;Cryotherapy;Neuromuscular re-education;Gait training;Stair training;DME Instruction;Functional mobility  training;Patient/family education   PT Next Visit Plan Recommend decrease to 1x/week next week to allow transition to community  exercise program.  Progress standing tolerance, LE strength, gait for community activities.   Consulted and Agree with Plan of Care Patient      Patient will benefit from skilled therapeutic intervention in order to improve the following deficits and impairments:  Abnormal gait, Difficulty walking, Decreased activity tolerance, Decreased balance, Decreased mobility, Decreased strength, Postural dysfunction, Impaired flexibility, Pain, Decreased endurance  Visit Diagnosis: Pain in left knee  Muscle weakness (generalized)  Other abnormalities of gait and mobility  Pain in left hip     Problem List Patient Active Problem List   Diagnosis Date Noted  . HLD (hyperlipidemia) 11/21/2014  . Depression 11/21/2014  . Nausea & vomiting 11/21/2014  . Sepsis ([place]) 11/21/2014  . Coughing 11/21/2014  . UTI (lower urinary tract infection) 11/21/2014  . AKI (acute kidney injury) ([place]) 11/21/2014  . Nausea with vomiting   . 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 HNP (herniated nucleus pulposus), cervical 10/15/2011    Class: Chronic  . Diabetes mellitus without complication ([place]) 71/95/9747  . Essential hypertension 09/30/2006  . DILATION AND CURETTAGE, HX OF 09/30/2006    Kaison Mcparland, PT 10/19/2015, 3:51 PM  [facility] [address] [place], [name], [address] Phone: [phone]   Fax:  [phone]  Name: Jodi Chavez MRN: [id number] Date of Birth: 04-22-1950

## 2015-10-20 ENCOUNTER — Ambulatory Visit: Payer: Medicare Other | Admitting: Physical Therapy

## 2015-10-20 DIAGNOSIS — R2689 Other abnormalities of gait and mobility: Secondary | ICD-10-CM

## 2015-10-20 DIAGNOSIS — M25562 Pain in left knee: Secondary | ICD-10-CM | POA: Diagnosis not present

## 2015-10-20 DIAGNOSIS — M6281 Muscle weakness (generalized): Secondary | ICD-10-CM

## 2015-10-20 NOTE — Therapy (Signed)
[facility] [address] [place], [name], [address] Phone: [phone]   Fax:  [phone]  Physical Therapy Treatment  Patient Details  Name: Jodi Chavez MRN: [id number] Date of Birth: [birth year] Referring Provider: Nolene Ebbs, MD  Encounter Date: 10/20/2015      PT End of Session - 10/20/15 1017    Visit Number 27   Number of Visits 28   Date for PT Re-Evaluation 11/18/15   Authorization Type G code every 10th visit   PT Start Time 0938   PT Stop Time 1016   PT Time Calculation (min) 38 min   Equipment Utilized During Treatment Gait belt   Activity Tolerance Patient tolerated treatment well   Behavior During Therapy [facility] for tasks assessed/performed      Past Medical History:  Diagnosis Date  . Arthritis    hip  . Constipation    uses laxatives several times a week  . Depression   . Diabetes mellitus    takes Metformin and Amaryl daily  . Dizziness    occasionally and related to meds   . Early cataracts, bilateral   . History of bronchitis    last time several yrs ago  . History of migraine    many yrs ago  . Hyperlipidemia    takes Zetia and Zocor daily  . Hypertension    takes Benazepril nightly and Propranolol tid and Clonidine daily  . Insomnia   . Low back pain   . Peripheral edema   . Peripheral neuropathy ([place])   . PONV (postoperative nausea and vomiting)   . Slow urinary stream    occasionally    Past Surgical History:  Procedure Laterality Date  . COLONOSCOPY    . d&c/hysteroscopy/ablation    . DILATION AND CURETTAGE OF UTERUS     couple of times  . ESOPHAGOGASTRODUODENOSCOPY    . 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 POSTERIOR CERVICAL FUSION/FORAMINOTOMY  10/15/2011   Procedure: POSTERIOR CERVICAL FUSION/FORAMINOTOMY LEVEL 2;  Surgeon: Jessy Oto, MD;  Location: [place];  Service: Orthopedics;  Laterality: N/A;  Right C6-7, C7-T1 Foraminotomy  with excision HNP C7-T1  . UPPER GASTROINTESTINAL ENDOSCOPY      There were no vitals filed for this visit.      Subjective Assessment - 10/20/15 0946    Subjective nothing new to report   Patient Stated Goals Manage pain, exercises for home for strengthening.   Currently in Pain? Yes   Pain Score 4    Pain Location Knee   Pain Orientation Left   Pain Descriptors / Indicators Aching   Pain Onset More than a month ago   Pain Frequency Intermittent   Pain Score 7   Pain Location Back   Pain Orientation Lower   Pain Descriptors / Indicators Aching   Pain Type Chronic pain   Pain Onset More than a month ago   Pain Frequency Intermittent                         [facility] Adult PT Treatment/Exercise - 10/20/15 0001      Knee/Hip Exercises: Aerobic   Nustep Level 1, 61mn continuous  Pt practice setup of machine, mod cues.             Balance Exercises - 10/20/15 1000      Balance Exercises: Standing   Other Standing Exercises standing in the parallel bars working  in tolerance without UE support: red T-band, alt UE exercises- bicep , tricep  49mn x2 with intermittent UE support Heel/toe raises 10x2, 1 UE support           PT Education - 10/20/15 0949    Education provided Yes   Education Details scheduling and change in frequency per POC   Person(s) Educated Patient   Methods Explanation   Comprehension Verbalized understanding          PT Short Term Goals - 10/19/15 1500      PT SHORT TERM GOAL #1   Title The patient will tolerate 10 minutes of standing activities before requring rest break.   Baseline Met on 09/28/2015   Time 4   Period Weeks   Status Achieved     PT SHORT TERM GOAL #2   Title The patient will improve stair negotiation with one handrail x 4 steps with supervision to demo dec'ing dependence on bilateral UEs for stair negotiation.   Baseline Patient requires use of 2 rails, but can perform with supervision.    Time 4    Period Weeks   Status Partially Met     PT SHORT TERM GOAL #3   Title The patient will improve walking speed from 0.98 ft/sec to > or equal to 1.2 ft/sec to demo improving mobility.   Baseline Met on 10/19/2015 with 1.27 ft/sec with RW modified indep.    Time 4   Period Weeks   Status Achieved     PT SHORT TERM GOAL #4   Title The patient will ambulate with RW modified indep x 300 ft nonstop to demo improved tolerance to walking.   Baseline Met on 09/28/2015   Time 4   Period Weeks   Status Achieved           PT Long Term Goals - 09/29/15 1414      PT LONG TERM GOAL #1   Title The patient will be indep with future progression of HEP.   Baseline Target date 11/16/2015   Time 8   Period Weeks   Status On-going     PT LONG TERM GOAL #2   Title The patient will ambulate 345 ft with RW modified indep for improved endurance.   Baseline Target date 11/16/2015   Time 8   Period Weeks   Status On-going     PT LONG TERM GOAL #3   Title The patient will move sit<>stand x 10 reps without UE support to demo improved LE strength   Baseline Target date 11/16/2015   Time 8   Period Weeks   Status On-going     PT LONG TERM GOAL #4   Title The patient will improve gait speed to > or equal to 1.4 ft/sec (from 0.98 ft/sec today).   Baseline Target date 11/16/2015   Time 8   Period Weeks   Status On-going               Plan - 10/20/15 0949    Clinical Impression Statement Pt requires mod cues for set up on different exercise machine today. Pt tolerated standing with intermittent UE support 567m + 61m4mwith low level balance activity.   PT Treatment/Interventions ADLs/Self Care Home Management;Manual techniques;Therapeutic exercise;Therapeutic activities;Moist Heat;Cryotherapy;Neuromuscular re-education;Gait training;Stair training;DME Instruction;Functional mobility training;Patient/family education   PT Next Visit Plan   Progress standing tolerance, LE strength, gait for  community activities.   Consulted and Agree with Plan of Care Patient  Patient will benefit from skilled therapeutic intervention in order to improve the following deficits and impairments:  Abnormal gait, Difficulty walking, Decreased activity tolerance, Decreased balance, Decreased mobility, Decreased strength, Postural dysfunction, Impaired flexibility, Pain, Decreased endurance  Visit Diagnosis: Pain in left knee  Muscle weakness (generalized)  Other abnormalities of gait and mobility     Problem List Patient Active Problem List   Diagnosis Date Noted  . HLD (hyperlipidemia) 11/21/2014  . Depression 11/21/2014  . Nausea & vomiting 11/21/2014  . Sepsis ([place]) 11/21/2014  . Coughing 11/21/2014  . UTI (lower urinary tract infection) 11/21/2014  . AKI (acute kidney injury) ([place]) 11/21/2014  . Nausea with vomiting   . 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 HNP (herniated nucleus pulposus), cervical 10/15/2011    Class: Chronic  . Diabetes mellitus without complication ([place]) 29/03/1113  . Essential hypertension 09/30/2006  . DILATION AND CURETTAGE, HX OF 09/30/2006    Bjorn Loser, PTA  10/20/15, 1:03 PM [facility] [address] [place], [name], [address] Phone: [phone]   Fax:  [phone]  Name: Jodi Chavez MRN: [id number] Date of Birth: 1951/01/16

## 2015-10-24 ENCOUNTER — Ambulatory Visit: Payer: Medicare Other | Admitting: Rehabilitative and Restorative Service Providers"

## 2015-10-26 ENCOUNTER — Ambulatory Visit: Payer: Medicare Other | Admitting: Rehabilitative and Restorative Service Providers"

## 2015-10-26 DIAGNOSIS — M25562 Pain in left knee: Secondary | ICD-10-CM

## 2015-10-26 DIAGNOSIS — M6281 Muscle weakness (generalized): Secondary | ICD-10-CM

## 2015-10-26 DIAGNOSIS — R2689 Other abnormalities of gait and mobility: Secondary | ICD-10-CM

## 2015-10-26 NOTE — Patient Instructions (Addendum)
AT THE GYM:  Possible cardio equipment GOAL 20-30 minutes (working up to 3-5 days/week): 1) Arm bike x 5 minutes  Can do 2.5 minutes forward, then 2.5 minutes backwards  2) Sci-fit seated stepper x 10 minutes, working up to 15-20 minutes Use arms and legs for increased activity  3) Recumbent bike (find out if there is one where the seat turns) x 10 minutes Do within comfortable range of motion for the knee  4) Walking working on increasing > 5 minutes nonstop   STRENGTHENING EXERCISES  GOAL 2 days/week: Focus on-- 1) Quad strengthening (front of thigh) on knee extension machine 2) Hamstring strengthening (back of thigh) on knee flexion machine 3) Sitting posture exercise (upper back) such as rows 4) Leg press if able to get on/off safely

## 2015-10-26 NOTE — Therapy (Signed)
[facility] [address] [place], [name], [address] Phone: [phone]   Fax:  [phone]  Physical Therapy Treatment  Patient Details  Name: Jodi Chavez MRN: [id number] Date of Birth: [birth year] Referring Provider: Nolene Ebbs, MD  Encounter Date: 10/26/2015      PT End of Session - 10/26/15 1243    Visit Number 28   Number of Visits 28   Date for PT Re-Evaluation 11/18/15   Authorization Type G code every 10th visit   PT Start Time 1238   PT Stop Time 1318   PT Time Calculation (min) 40 min   Equipment Utilized During Treatment Gait belt   Activity Tolerance Patient tolerated treatment well   Behavior During Therapy [facility] for tasks assessed/performed      Past Medical History:  Diagnosis Date  . Arthritis    hip  . Constipation    uses laxatives several times a week  . Depression   . Diabetes mellitus    takes Metformin and Amaryl daily  . Dizziness    occasionally and related to meds   . Early cataracts, bilateral   . History of bronchitis    last time several yrs ago  . History of migraine    many yrs ago  . Hyperlipidemia    takes Zetia and Zocor daily  . Hypertension    takes Benazepril nightly and Propranolol tid and Clonidine daily  . Insomnia   . Low back pain   . Peripheral edema   . Peripheral neuropathy ([place])   . PONV (postoperative nausea and vomiting)   . Slow urinary stream    occasionally    Past Surgical History:  Procedure Laterality Date  . COLONOSCOPY    . d&c/hysteroscopy/ablation    . DILATION AND CURETTAGE OF UTERUS     couple of times  . ESOPHAGOGASTRODUODENOSCOPY    . 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 POSTERIOR CERVICAL FUSION/FORAMINOTOMY  10/15/2011   Procedure: POSTERIOR CERVICAL FUSION/FORAMINOTOMY LEVEL 2;  Surgeon: Jessy Oto, MD;  Location: [facility];  Service: Orthopedics;  Laterality: N/A;  Right C6-7, C7-T1 Foraminotomy  with excision HNP C7-T1  . UPPER GASTROINTESTINAL ENDOSCOPY      There were no vitals filed for this visit.      Subjective Assessment - 10/26/15 1241    Subjective The patient reports she is doing HEP.  She plans to join the gym next month.   Patient Stated Goals Manage pain, exercises for home for strengthening.   Currently in Pain? Yes   Pain Score --  not at rest, mainly with walking   Pain Location --  knee and hip   Pain Orientation Left   Pain Descriptors / Indicators Aching   Pain Type Chronic pain   Pain Onset More than a month ago   Pain Frequency Intermittent   Aggravating Factors  hurts with standing   Pain Relieving Factors rest and walking                         [facility] Adult PT Treatment/Exercise - 10/26/15 1443      Ambulation/Gait   Ambulation/Gait Yes   Ambulation/Gait Assistance 6: Modified independent (Device/Increase time)   Ambulation Distance (Feet) 345 Feet   Assistive device Rolling walker   Gait Comments provided verbal cues fo rupright posture and dec'd dependence on UEs during gait     Self-Care   Self-Care  Other Self-Care Comments   Other Self-Care Comments  discussed gym routine and printed sample activitied to ask for help with during gym orientation.  Recommended she perform activites from today's session including: UBE, seated stepper and walking for endurance/cardio activities.     Exercises   Exercises Knee/Hip     Knee/Hip Exercises: Aerobic   Stepper level 3 x 10 minutes nonstop with UEs/LEs.   Other Aerobic UBE x 4 minutes (2 forward/2back) for endurance training                PT Education - 10/26/15 1305    Education provided Yes   Education Details community exercises:  cardio and strengthening   Person(s) Educated Patient   Methods Explanation;Handout   Comprehension Verbalized understanding          PT Short Term Goals - 10/19/15 1500      PT SHORT TERM GOAL #1   Title The patient will  tolerate 10 minutes of standing activities before requring rest break.   Baseline Met on 09/28/2015   Time 4   Period Weeks   Status Achieved     PT SHORT TERM GOAL #2   Title The patient will improve stair negotiation with one handrail x 4 steps with supervision to demo dec'ing dependence on bilateral UEs for stair negotiation.   Baseline Patient requires use of 2 rails, but can perform with supervision.    Time 4   Period Weeks   Status Partially Met     PT SHORT TERM GOAL #3   Title The patient will improve walking speed from 0.98 ft/sec to > or equal to 1.2 ft/sec to demo improving mobility.   Baseline Met on 10/19/2015 with 1.27 ft/sec with RW modified indep.    Time 4   Period Weeks   Status Achieved     PT SHORT TERM GOAL #4   Title The patient will ambulate with RW modified indep x 300 ft nonstop to demo improved tolerance to walking.   Baseline Met on 09/28/2015   Time 4   Period Weeks   Status Achieved           PT Long Term Goals - 09/29/15 1414      PT LONG TERM GOAL #1   Title The patient will be indep with future progression of HEP.   Baseline Target date 11/16/2015   Time 8   Period Weeks   Status On-going     PT LONG TERM GOAL #2   Title The patient will ambulate 345 ft with RW modified indep for improved endurance.   Baseline Target date 11/16/2015   Time 8   Period Weeks   Status On-going     PT LONG TERM GOAL #3   Title The patient will move sit<>stand x 10 reps without UE support to demo improved LE strength   Baseline Target date 11/16/2015   Time 8   Period Weeks   Status On-going     PT LONG TERM GOAL #4   Title The patient will improve gait speed to > or equal to 1.4 ft/sec (from 0.98 ft/sec today).   Baseline Target date 11/16/2015   Time 8   Period Weeks   Status On-going               Plan - 10/26/15 1446    Clinical Impression Statement The patient tolerates cardio activities and walking in the clinic well.  Emphasizing  transition to community program for improved  carryover post d/c from PT.    PT Treatment/Interventions ADLs/Self Care Home Management;Manual techniques;Therapeutic exercise;Therapeutic activities;Moist Heat;Cryotherapy;Neuromuscular re-education;Gait training;Stair training;DME Instruction;Functional mobility training;Patient/family education   PT Next Visit Plan Discuss community program, d/c planning, CERT ORDER DUE   Consulted and Agree with Plan of Care Patient      Patient will benefit from skilled therapeutic intervention in order to improve the following deficits and impairments:  Abnormal gait, Difficulty walking, Decreased activity tolerance, Decreased balance, Decreased mobility, Decreased strength, Postural dysfunction, Impaired flexibility, Pain, Decreased endurance  Visit Diagnosis: Pain in left knee  Muscle weakness (generalized)  Other abnormalities of gait and mobility     Problem List Patient Active Problem List   Diagnosis Date Noted  . HLD (hyperlipidemia) 11/21/2014  . Depression 11/21/2014  . Nausea & vomiting 11/21/2014  . Sepsis ([place]) 11/21/2014  . Coughing 11/21/2014  . UTI (lower urinary tract infection) 11/21/2014  . AKI (acute kidney injury) ([place]) 11/21/2014  . Nausea with vomiting   . 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 HNP (herniated nucleus pulposus), cervical 10/15/2011    Class: Chronic  . Diabetes mellitus without complication ([place]) 42/37/0230  . Essential hypertension 09/30/2006  . DILATION AND CURETTAGE, HX OF 09/30/2006    Michal Callicott, PT 10/26/2015, 2:47 PM  [facility] [address] [place], [name], [address] Phone: [phone]   Fax:  [phone]  Name: Jodi Chavez MRN: [id number] Date of Birth: 08/18/1950

## 2015-10-27 ENCOUNTER — Encounter: Payer: Self-pay | Admitting: Dietician

## 2015-10-27 ENCOUNTER — Encounter: Payer: Medicare Other | Attending: Internal Medicine | Admitting: Dietician

## 2015-10-27 DIAGNOSIS — E119 Type 2 diabetes mellitus without complications: Secondary | ICD-10-CM

## 2015-10-27 DIAGNOSIS — E669 Obesity, unspecified: Secondary | ICD-10-CM

## 2015-10-27 NOTE — Progress Notes (Signed)
Medical Nutrition Therapy:  Appt start time: 1415 end time:  1515.   Assessment:  Primary concerns today: Follow-up for weight loss and diabetes. Patient has been in the contemplation and preparation stage of change for the eating habits we discussed at our last visit.  She has gained approximately 4 lbs over the last month, weighing 236 lbs today, up from 232 lbs at our last visit.  She did have a steroid shot in her knee during this time.  She appears very motivated to increase her exercise levels while her knee is feeling better.  She did purchase glucose tablets and has also been carrying healthy snacks with her.  She states that she thinks her portions are more than they should be but she did buy measuring cups to help with her starch portions.  Focus today was on reviewing recommendations made at last visit and brainstorming how she can put that into practice.  She also requested more weight loss tips so we reviewed a handout together and she chose 2-3 specific goals from that worksheet noted below.  She also plans to bring her sister to her next visit if she is available since she is the primary cook in their household.  DIETARY INTAKE:  Usual eating pattern includes 2 meals and 2 snacks per day.  Everyday foods include sugar free cookies and sugar free ice cream.  Avoided foods include most non starchy vegetables.  The only non-starchy veggies she likes currently are green beans and carrots.  24-hr recall:  B (AM): none Snk ( AM): none  L (11-12 AM):  1 cup instant grits and 2 slices [employer] toast, peanut butter and sugar-free jelly, unsweetened applesauce with splenda, and sugar-free pudding, diet 7-up Snk (2-3 PM):  No added sugar popsicle or none D (5-6 PM):   2 chicken nuggets, mashed potatoes, lima beans OR meatloaf, mashed potatoes, green beans OR out to eat to [employer] or [employer] Snk (10 PM):  1-2 scoops ice cream or sugar free cookies or NABS Beverages: diet 7up, 3 cups of water  daily, occasionally sweet tea or unsweetened with sweet and low.  Usual physical activity: ADL's.  Physical therapy 2 times a week since May, just started this week doing 1x/week and has been given written exercises to do on her own 2x/week either at the [facility] or [employer].  Patient uses a walker.    Estimated energy needs: 1200 calories 135 g carbohydrates 90 g protein 33 g fat  Progress Towards Goal(s):  In progress.    Intervention:  Nutrition counseling/education for weight reduction.  Reviewed portion sizes for proteins, fats, and carbohydrates.  Reviewed the example meal options that follow these portion guidelines given at last visit.  Discussed listening to hunger and fullness cues and importance of meal timing.  She chose some mindful eating strategies as her goals in addition to the portions and balanced meals we have discussed.  Reviewed her personal meal plan guideline including restaurant choices that fit within a 400-450 calorie budget.  Encouraged increased water intake and reducing diet soda intake.  She also choice drinking 1 glass of water before meals as an additional goal to focus on this month.  Instructed patient to review her materials with her sister and invited her sister to attend future visits since she is handling majority of food purchasing and preparation.  Handouts given during visit include:  Nutrition Strategies for Weight Loss  Handouts reviewed from last visit: - Example Meal Options for Women (30  gram Carb) - Personalized meal plan  Goals: - Create balanced meals with no more than 1-2 servings (1/2 cup - 1 cup) of starch, 3-4 oz lean protein, and 1-2 servings of non-starchy vegetables.   - Try veggies that you haven't had in a while.  Try flavoring them in new ways with garlic, lemon juice, herbs, and olive oil. - Continue regular exercise, aiming for 3 times a week to begin with (1x/week with personal trainer and 2x/week on her own) - Drink a glass  of water before every meal - Have an afternoon snack to reduce hunger level at dinner time - Pay attention to your body and your hunger and fullness signals.  Stop eating when satisfied, not when full or stuffed.  You do not need to clean your plate.  Eat without distractions.  Continue to eat slowly.  Barriers to learning/adherence to lifestyle change: decreased ability to exercise, dislike of many foods/ limited food preferences  Demonstrated degree of understanding via:  Teach Back   Monitoring/Evaluation:  Dietary intake, exercise, and body weight in 1 month(s).

## 2015-11-14 ENCOUNTER — Ambulatory Visit: Payer: Medicare Other | Attending: Internal Medicine | Admitting: Rehabilitative and Restorative Service Providers"

## 2015-11-14 DIAGNOSIS — R2689 Other abnormalities of gait and mobility: Secondary | ICD-10-CM | POA: Diagnosis present

## 2015-11-14 DIAGNOSIS — M6281 Muscle weakness (generalized): Secondary | ICD-10-CM | POA: Insufficient documentation

## 2015-11-14 DIAGNOSIS — M25562 Pain in left knee: Secondary | ICD-10-CM | POA: Diagnosis not present

## 2015-11-14 NOTE — Patient Instructions (Signed)
[facility] [address] [place], [place] [phone]  [facility] [address] [place], [place] [phone]  [facility] [address] [place], [place] [phone]   Recommend gym routine x 3 days per week.

## 2015-11-14 NOTE — Therapy (Signed)
[facility] [address] [place], [name], [address] Phone: [phone]   Fax:  [phone]  Physical Therapy Treatment  Patient Details  Name: Jodi Chavez MRN: [id number] Date of Birth: 12-20-1950 Referring Provider: Nolene Ebbs, MD  Encounter Date: 11/14/2015      PT End of Session - 11/14/15 1932    Visit Number 29   Date for PT Re-Evaluation 11/18/15   Authorization Type G code every 10th visit   PT Start Time 1108   PT Stop Time 1148   PT Time Calculation (min) 40 min   Equipment Utilized During Treatment --   Activity Tolerance Patient tolerated treatment well   Behavior During Therapy [facility] for tasks assessed/performed      Past Medical History:  Diagnosis Date  . Arthritis    hip  . Constipation    uses laxatives several times a week  . Depression   . Diabetes mellitus    takes Metformin and Amaryl daily  . Dizziness    occasionally and related to meds   . Early cataracts, bilateral   . History of bronchitis    last time several yrs ago  . History of migraine    many yrs ago  . Hyperlipidemia    takes Zetia and Zocor daily  . Hypertension    takes Benazepril nightly and Propranolol tid and Clonidine daily  . Insomnia   . Low back pain   . Peripheral edema   . Peripheral neuropathy ([place])   . PONV (postoperative nausea and vomiting)   . Slow urinary stream    occasionally    Past Surgical History:  Procedure Laterality Date  . COLONOSCOPY    . d&c/hysteroscopy/ablation    . DILATION AND CURETTAGE OF UTERUS     couple of times  . ESOPHAGOGASTRODUODENOSCOPY    . 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 POSTERIOR CERVICAL FUSION/FORAMINOTOMY  10/15/2011   Procedure: POSTERIOR CERVICAL FUSION/FORAMINOTOMY LEVEL 2;  Surgeon: Jessy Oto, MD;  Location: [facility];  Service: Orthopedics;  Laterality: N/A;  Right C6-7, C7-T1 Foraminotomy with excision HNP C7-T1  . UPPER  GASTROINTESTINAL ENDOSCOPY      There were no vitals filed for this visit.      Subjective Assessment - 11/14/15 1115    Subjective The patient reports she did not join a gym because they did not have equipment she could use.  She tried a recumbent bike, but her legs would not do a full circle.  She reports she tried a [employer], [employer] and [employer] and neither had a seated stepper.  "My legs won't go up high enough" and also noted the seates to equipment didn't turn.     Patient Stated Goals Manage pain, exercises for home for strengthening.                         [facility] Adult PT Treatment/Exercise - 11/14/15 1935      Transfers   Transfers Sit to Stand   Sit to Stand 6: Modified independent (Device/Increase time)   Sit to Stand Details (indicate cue type and reason) patient uses one UE as her low back and L LE hurt after 2-3 reps without UE support.       Ambulation/Gait   Ambulation/Gait Yes   Ambulation/Gait Assistance 6: Modified independent (Device/Increase time)   Ambulation Distance (Feet) 350 Feet   Assistive device Rolling walker  Gait velocity 1.3 ft/sec   Gait Comments Patient takes standing rest break after 200 feet to allow UEs a minute to relax.      Self-Care   Self-Care Other Self-Care Comments   Other Self-Care Comments  PT and patient discussed barriers for transition to community programs.  It appears she has attempted by going to [employer] and [employer] gyms to look around facilities.  Neither location had a seated stepper and her LEs were too tight to perform recumbent bike.  PT encouraged her to check out [employer] as other patients use seated steppers at those locations.   Discussed possible options and provided contact #s for 2 [facility] and [facility].  Discussed home exercises for strengthening and performing more ADLs in standing position.                  PT Education - 11/14/15 1932    Education provided Yes    Education Details provided further community contact #s for [employer] and [employer] community center   [employer]) Educated Patient   Methods Explanation   Comprehension Verbalized understanding          PT Short Term Goals - 10/19/15 1500      PT SHORT TERM GOAL #1   Title The patient will tolerate 10 minutes of standing activities before requring rest break.   Baseline Met on 09/28/2015   Time 4   Period Weeks   Status Achieved     PT SHORT TERM GOAL #2   Title The patient will improve stair negotiation with one handrail x 4 steps with supervision to demo dec'ing dependence on bilateral UEs for stair negotiation.   Baseline Patient requires use of 2 rails, but can perform with supervision.    Time 4   Period Weeks   Status Partially Met     PT SHORT TERM GOAL #3   Title The patient will improve walking speed from 0.98 ft/sec to > or equal to 1.2 ft/sec to demo improving mobility.   Baseline Met on 10/19/2015 with 1.27 ft/sec with RW modified indep.    Time 4   Period Weeks   Status Achieved     PT SHORT TERM GOAL #4   Title The patient will ambulate with RW modified indep x 300 ft nonstop to demo improved tolerance to walking.   Baseline Met on 09/28/2015   Time 4   Period Weeks   Status Achieved           PT Long Term Goals - 11/14/15 1117      PT LONG TERM GOAL #1   Title The patient will be indep with future progression of HEP.   Baseline Patient reports she does these intermittently.   Time 8   Period Weeks   Status Achieved     PT LONG TERM GOAL #2   Title The patient will ambulate 345 ft with RW modified indep for improved endurance.   Baseline Met on 11/14/2015   Time 8   Period Weeks   Status Achieved     PT LONG TERM GOAL #3   Title The patient will move sit<>stand x 10 reps without UE support to demo improved LE strength   Baseline Patient c/o L knee and low back pain with sit<>stand without UE support.  She can tolerate one UE support.    Time 8    Period Weeks   Status Partially Met     PT LONG TERM GOAL #  4   Title The patient will improve gait speed to > or equal to 1.4 ft/sec (from 0.98 ft/sec today).   Baseline Scores 1.3 ft/sec at today's visit.    Time 8   Period Weeks   Status Partially Met               Plan - 11/14/15 1934    Clinical Impression Statement The patient met 2 LTGs.  PT and patient discussed barriers that she has faced while attempting to transition to community program of not having seated stepper at [place] locations.  The patient drives to [place] for PT and I encouraged her to consider driving here for community exercise program as this is what will allow her to maintain strength/mobiltiy gains from physical therapy.    PT Treatment/Interventions ADLs/Self Care Home Management;Manual techniques;Therapeutic exercise;Therapeutic activities;Moist Heat;Cryotherapy;Neuromuscular re-education;Gait training;Stair training;DME Instruction;Functional mobility training;Patient/family education   PT Next Visit Plan Discuss community program, d/c planning, CERT ORDER DUE for 9/44.   Consulted and Agree with Plan of Care Patient      Patient will benefit from skilled therapeutic intervention in order to improve the following deficits and impairments:  Abnormal gait, Difficulty walking, Decreased activity tolerance, Decreased balance, Decreased mobility, Decreased strength, Postural dysfunction, Impaired flexibility, Pain, Decreased endurance  Visit Diagnosis: Pain in left knee  Muscle weakness (generalized)  Other abnormalities of gait and mobility     Problem List Patient Active Problem List   Diagnosis Date Noted  . HLD (hyperlipidemia) 11/21/2014  . Depression 11/21/2014  . Nausea & vomiting 11/21/2014  . Sepsis ([place]) 11/21/2014  . Coughing 11/21/2014  . UTI (lower urinary tract infection) 11/21/2014  . AKI (acute kidney injury) ([place]) 11/21/2014  . Nausea with vomiting   . Cervical spondylosis  with radiculopathy 10/15/2011    Class: Chronic  . HNP (herniated nucleus pulposus), cervical 10/15/2011    Class: Chronic  . Diabetes mellitus without complication ([place]) 73/95/8441  . Essential hypertension 09/30/2006  . DILATION AND CURETTAGE, HX OF 09/30/2006    Estevon Fluke, PT 11/14/2015, 7:40 PM  [facility] [address] [place], [name], [address] Phone: [phone]   Fax:  [phone]  Name: EVANN KOELZER MRN: [id number] Date of Birth: 13-Aug-1950

## 2015-11-21 ENCOUNTER — Ambulatory Visit: Payer: Medicare Other | Admitting: Rehabilitative and Restorative Service Providers"

## 2015-12-05 ENCOUNTER — Ambulatory Visit: Payer: Medicare Other | Admitting: Rehabilitative and Restorative Service Providers"

## 2015-12-08 ENCOUNTER — Ambulatory Visit: Payer: Medicare Other | Admitting: Dietician

## 2015-12-21 ENCOUNTER — Ambulatory Visit: Payer: Medicare Other | Attending: Internal Medicine | Admitting: Rehabilitative and Restorative Service Providers"

## 2015-12-21 DIAGNOSIS — M6281 Muscle weakness (generalized): Secondary | ICD-10-CM | POA: Insufficient documentation

## 2015-12-21 DIAGNOSIS — R2689 Other abnormalities of gait and mobility: Secondary | ICD-10-CM | POA: Diagnosis present

## 2015-12-21 DIAGNOSIS — M25562 Pain in left knee: Secondary | ICD-10-CM

## 2015-12-22 ENCOUNTER — Ambulatory Visit: Payer: Medicare Other | Admitting: Dietician

## 2015-12-22 NOTE — Therapy (Signed)
[facility] [address] [place], [name], [address] Phone: [phone]   Fax:  [phone]  Physical Therapy Treatment  Patient Details  Name: Jodi Chavez MRN: [id number] Date of Birth: 1950/11/02 Referring Provider: Nolene Ebbs, MD  Encounter Date: 12/21/2015      PT End of Session - 12/21/15 1207    Visit Number 30   Date for PT Re-Evaluation --  d/c today   Authorization Type G code every 10th visit   PT Start Time 1200   PT Stop Time 1230   PT Time Calculation (min) 30 min   Activity Tolerance Patient tolerated treatment well   Behavior During Therapy [facility] for tasks assessed/performed      Past Medical History:  Diagnosis Date  . Arthritis    hip  . Constipation    uses laxatives several times a week  . Depression   . Diabetes mellitus    takes Metformin and Amaryl daily  . Dizziness    occasionally and related to meds   . Early cataracts, bilateral   . History of bronchitis    last time several yrs ago  . History of migraine    many yrs ago  . Hyperlipidemia    takes Zetia and Zocor daily  . Hypertension    takes Benazepril nightly and Propranolol tid and Clonidine daily  . Insomnia   . Low back pain   . Peripheral edema   . Peripheral neuropathy ([place])   . PONV (postoperative nausea and vomiting)   . Slow urinary stream    occasionally    Past Surgical History:  Procedure Laterality Date  . COLONOSCOPY    . d&c/hysteroscopy/ablation    . DILATION AND CURETTAGE OF UTERUS     couple of times  . ESOPHAGOGASTRODUODENOSCOPY    . 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 POSTERIOR CERVICAL FUSION/FORAMINOTOMY  10/15/2011   Procedure: POSTERIOR CERVICAL FUSION/FORAMINOTOMY LEVEL 2;  Surgeon: Jessy Oto, MD;  Location: [facility];  Service: Orthopedics;  Laterality: N/A;  Right C6-7, C7-T1 Foraminotomy with excision HNP C7-T1  . UPPER GASTROINTESTINAL ENDOSCOPY       There were no vitals filed for this visit.      Subjective Assessment - 12/21/15 1204    Subjective The patient reports she has seen a trainer at [facility] and has gone 2-3 visits.  She reports she tolerated activities well.  She is not doing HEP as consistently as intended.   She reports pain in L hip and knee after exercise.  She feels like she is walking more in the home.    Patient Stated Goals Manage pain, exercises for home for strengthening.   Currently in Pain? No/denies  none at rest      THERAPEUTIC EXERCISE: Sit<>stand x 10 reps with intermittent UE support and c/o knee discomfort with repetition Long arc quads x 10 reps Standing heel raises Reviewed hamstring stretches seated  Gait: Ambulation x 200 feet with RW modified indep with cues on dec'ing UE support through walker 100 ft x 2 reps with cues on posture modified indep  SELF CARE/HOME MANAGEMENT: Discussed community program.  Patient is paying for [facility] room to be utilized 2x/week at minimum.  She has only been able to attend x 3 visits at this time.  PT encouraged regular participation and discussed the goals of community wellness (continuing to advance strength, posture, and gait activities).  PT Short Term Goals - 10/19/15 1500      PT SHORT TERM GOAL #1   Title The patient will tolerate 10 minutes of standing activities before requring rest break.   Baseline Met on 09/28/2015   Time 4   Period Weeks   Status Achieved     PT SHORT TERM GOAL #2   Title The patient will improve stair negotiation with one handrail x 4 steps with supervision to demo dec'ing dependence on bilateral UEs for stair negotiation.   Baseline Patient requires use of 2 rails, but can perform with supervision.    Time 4   Period Weeks   Status Partially Met     PT SHORT TERM GOAL #3   Title The patient will improve walking speed from 0.98 ft/sec to > or equal to 1.2 ft/sec to demo  improving mobility.   Baseline Met on 10/19/2015 with 1.27 ft/sec with RW modified indep.    Time 4   Period Weeks   Status Achieved     PT SHORT TERM GOAL #4   Title The patient will ambulate with RW modified indep x 300 ft nonstop to demo improved tolerance to walking.   Baseline Met on 09/28/2015   Time 4   Period Weeks   Status Achieved           PT Long Term Goals - 12/21/15 1214      PT LONG TERM GOAL #1   Title The patient will be indep with future progression of HEP.   Baseline Patient reports she does these intermittently.   Time 8   Period Weeks   Status Achieved     PT LONG TERM GOAL #2   Title The patient will ambulate 345 ft with RW modified indep for improved endurance.   Baseline Met on 11/14/2015   Time 8   Period Weeks   Status Achieved     PT LONG TERM GOAL #3   Title The patient will move sit<>stand x 10 reps without UE support to demo improved LE strength   Baseline Patient c/o L knee and low back pain with sit<>stand without UE support.  She can tolerate one UE support.    Time 8   Period Weeks   Status Partially Met     PT LONG TERM GOAL #4   Title The patient will improve gait speed to > or equal to 1.4 ft/sec (from 0.98 ft/sec today).   Baseline Scores 1.25 ft/sec at today's visit.     Time 8   Period Weeks   Status Partially Met               Plan - 12/22/15 0859    Clinical Impression Statement The patient has partially met LTGs.  PT recommends continued work toward transition to [employer]   PT Next Visit Plan Discharge today   Consulted and Agree with Plan of Care Patient      Patient will benefit from skilled therapeutic intervention in order to improve the following deficits and impairments:     Visit Diagnosis: Muscle weakness (generalized)  Left knee pain, unspecified chronicity  Other abnormalities of gait and mobility     Problem List Patient Active Problem List   Diagnosis Date Noted  . HLD  (hyperlipidemia) 11/21/2014  . Depression 11/21/2014  . Nausea & vomiting 11/21/2014  . Sepsis ([place]) 11/21/2014  . Coughing 11/21/2014  . UTI (lower urinary tract infection) 11/21/2014  . AKI (acute kidney injury) ([place])  11/21/2014  . Nausea with vomiting   . 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 HNP (herniated nucleus pulposus), cervical 10/15/2011    Class: Chronic  . Diabetes mellitus without complication ([place]) 73/53/2992  . Essential hypertension 09/30/2006  . DILATION AND CURETTAGE, HX OF 09/30/2006    Ivania Teagarden, PT 12/22/2015, 9:00 AM  [facility] [address] [place], [name], [address] Phone: [phone]   Fax:  [phone]  Name: Jodi Chavez MRN: [id number] Date of Birth: May 15, 1950

## 2015-12-22 NOTE — Therapy (Signed)
[facility] [address] [place], [name], [address] Phone: [phone]   Fax:  [phone]  Patient Details  Name: Jodi Chavez MRN: [id number] Date of Birth: 10-26-1950 Referring Provider:  Nolene Ebbs, MD  Encounter Date: 12/20/2015  PHYSICAL THERAPY DISCHARGE SUMMARY  Visits from Start of Care: 30  Current functional level related to goals / functional outcomes:     PT Short Term Goals - 10/19/15 1500      PT SHORT TERM GOAL #1   Title The patient will tolerate 10 minutes of standing activities before requring rest break.   Baseline Met on 09/28/2015   Time 4   Period Weeks   Status Achieved     PT SHORT TERM GOAL #2   Title The patient will improve stair negotiation with one handrail x 4 steps with supervision to demo dec'ing dependence on bilateral UEs for stair negotiation.   Baseline Patient requires use of 2 rails, but can perform with supervision.    Time 4   Period Weeks   Status Partially Met     PT SHORT TERM GOAL #3   Title The patient will improve walking speed from 0.98 ft/sec to > or equal to 1.2 ft/sec to demo improving mobility.   Baseline Met on 10/19/2015 with 1.27 ft/sec with RW modified indep.    Time 4   Period Weeks   Status Achieved     PT SHORT TERM GOAL #4   Title The patient will ambulate with RW modified indep x 300 ft nonstop to demo improved tolerance to walking.   Baseline Met on 09/28/2015   Time 4   Period Weeks   Status Achieved         PT Long Term Goals - 12/21/15 1214      PT LONG TERM GOAL #1   Title The patient will be indep with future progression of HEP.   Baseline Patient reports she does these intermittently.   Time 8   Period Weeks   Status Achieved     PT LONG TERM GOAL #2   Title The patient will ambulate 345 ft with RW modified indep for improved endurance.   Baseline Met on 11/14/2015   Time 8   Period Weeks   Status Achieved     PT LONG  TERM GOAL #3   Title The patient will move sit<>stand x 10 reps without UE support to demo improved LE strength   Baseline Patient c/o L knee and low back pain with sit<>stand without UE support.  She can tolerate one UE support.    Time 8   Period Weeks   Status Partially Met     PT LONG TERM GOAL #4   Title The patient will improve gait speed to > or equal to 1.4 ft/sec (from 0.98 ft/sec today).   Baseline Scores 1.25 ft/sec at today's visit.     Time 8   Period Weeks   Status Partially Met        Remaining deficits: LE pain- intermittent in nature Postural abnormality in standing LE weakness   Education / Equipment: HEP, community wellness transition, home safety.  Plan: Patient agrees to discharge.  Patient goals were partially met. Patient is being discharged due to meeting the stated rehab goals.  ?????        Thank you for the referral of this patient. Rudell Cobb, MPT   [place] 12/22/2015, 9:07 AM  [facility]  [address] [place], [name], [address] Phone: [phone]   Fax:  [phone]

## 2015-12-23 ENCOUNTER — Encounter: Payer: Self-pay | Admitting: Rehabilitative and Restorative Service Providers"

## 2015-12-23 DIAGNOSIS — M6281 Muscle weakness (generalized): Secondary | ICD-10-CM

## 2015-12-23 DIAGNOSIS — R2689 Other abnormalities of gait and mobility: Secondary | ICD-10-CM

## 2015-12-23 DIAGNOSIS — M25562 Pain in left knee: Secondary | ICD-10-CM

## 2016-01-12 ENCOUNTER — Ambulatory Visit: Payer: Medicare Other | Admitting: Dietician

## 2016-01-18 ENCOUNTER — Encounter: Payer: Self-pay | Admitting: Podiatry

## 2016-01-18 ENCOUNTER — Ambulatory Visit (INDEPENDENT_AMBULATORY_CARE_PROVIDER_SITE_OTHER): Payer: Medicare Other | Admitting: Podiatry

## 2016-01-18 VITALS — BP 164/93 | HR 92 | Resp 18

## 2016-01-18 DIAGNOSIS — Q828 Other specified congenital malformations of skin: Secondary | ICD-10-CM

## 2016-01-18 DIAGNOSIS — B351 Tinea unguium: Secondary | ICD-10-CM | POA: Diagnosis not present

## 2016-01-18 DIAGNOSIS — M79676 Pain in unspecified toe(s): Secondary | ICD-10-CM | POA: Diagnosis not present

## 2016-01-18 DIAGNOSIS — E1140 Type 2 diabetes mellitus with diabetic neuropathy, unspecified: Secondary | ICD-10-CM | POA: Diagnosis not present

## 2016-01-18 DIAGNOSIS — L84 Corns and callosities: Secondary | ICD-10-CM

## 2016-01-18 NOTE — Patient Instructions (Signed)

## 2016-01-19 NOTE — Progress Notes (Signed)
Patient ID: Jodi JarvisDorcas P Chavez, female   DOB: Jan 05, 1951, 65 y.o.   MRN: [id number]    Subjective: This patient presents today complaining of toenails that are thickened and elongated which or cough walking wearing shoes and requests nail debridement. Also patient complaining of a uncomfortable callus on the plantar right foot  Objective: Orientated 3 No open skin lesions bilaterally DP and PT pulses 2/4 bilaterally Capillary reflex immediate bilaterally Sensation to 10 g monofilament wire intact 5/5 bilaterally Vibratory sensation reactive bilaterally Ankle reflex equal and reactive bilaterally Toenails 8 are elongated, hypertrophic, discolored and tender direct palpation Keratoses medial plantar first MPJ right Patient walks slowly with assistance of roller walker  Assessment: Type II diabetic Satisfactory neurovascular status Protective sensation intact Symptomatic onychomycose x 8 Keratoses 1  Plan: Debridement toenails 8 mechanically an electrically without any bleeding Debrided keratoses 1 without any bleeding  Reappoint 3 months

## 2016-02-09 ENCOUNTER — Encounter: Payer: Self-pay | Admitting: Dietician

## 2016-02-09 ENCOUNTER — Encounter: Payer: Medicare Other | Attending: Internal Medicine | Admitting: Dietician

## 2016-02-09 DIAGNOSIS — E119 Type 2 diabetes mellitus without complications: Secondary | ICD-10-CM | POA: Diagnosis not present

## 2016-02-09 DIAGNOSIS — Z6841 Body Mass Index (BMI) 40.0 and over, adult: Secondary | ICD-10-CM | POA: Insufficient documentation

## 2016-02-09 DIAGNOSIS — Z713 Dietary counseling and surveillance: Secondary | ICD-10-CM | POA: Diagnosis not present

## 2016-02-09 DIAGNOSIS — IMO0001 Reserved for inherently not codable concepts without codable children: Secondary | ICD-10-CM

## 2016-02-09 DIAGNOSIS — I10 Essential (primary) hypertension: Secondary | ICD-10-CM | POA: Insufficient documentation

## 2016-02-09 NOTE — Progress Notes (Signed)
  Medical Nutrition Therapy:  Appt start time: 1545 end time:  1615.   Assessment:  Primary concerns today: Follow-up for weight loss and diabetes. She has lost 3 lbs since our last visit and has been exercising consistently 2x/week.  She continues to have limited food preferences and depends on her sister for cooking her evening meals.  Diet intake has not varied much from last visit.  She is using the resources we created together for eating out and continues to drink mostly sugar-free beverages.  We reviewed our plan.  She was happy about the minor weight loss (BMI now 44.1%) and states her goal is to get down to a BMI of 40 to be eligible for surgery.    DIETARY INTAKE:  Usual eating pattern includes 2 meals and 2 snacks per day.  Everyday foods include sugar free cookies and sugar free ice cream.  Avoided foods include most non starchy vegetables.  24-hr recall:  B (AM): none Snk ( AM): none  L (11-12 AM):  1 cup instant grits and 2 slices [employer] toast, peanut butter and sugar-free jelly, unsweetened applesauce with splenda, and sugar-free pudding, diet 7-up Snk (2-3 PM):  No added sugar popsicle or none D (5-6 PM):   2 chicken nuggets, mashed potatoes, lima beans OR meatloaf, mashed potatoes, green beans OR out to eat to [employer] or [employer] Snk (10 PM):  1-2 scoops ice cream or sugar free cookies or NABS Beverages: diet 7up, 3-4 cups of water daily, occasionally sweet tea or unsweetened with sweet and low.  Usual physical activity: 2x/week goes to the gym, finished physical therapy, plans to increase to 3x/week in the next few months  Continued Goals: - Create balanced meals with no more than 1-2 servings (1/2 cup - 1 cup) of starch, 3-4 oz lean protein, and 1-2 servings of non-starchy vegetables.   - Try veggies that you haven't had in a while.  Try flavoring them in new ways with garlic, lemon juice, herbs, and olive oil. - Continue regular exercise, at least 2x/week - Drink a glass  of water before every meal - Have an afternoon snack to reduce hunger level at dinner time - Pay attention to your body and your hunger and fullness signals.  Stop eating when satisfied, not when full or stuffed.  You do not need to clean your plate.  Eat without distractions.  Continue to eat slowly.  Barriers to learning/adherence to lifestyle change: decreased ability to exercise, dislike of many foods/ limited food preferences  Demonstrated degree of understanding via:  Teach Back   Monitoring/Evaluation:  Dietary intake, exercise, and body weight in 1 month(s).

## 2016-02-09 NOTE — Patient Instructions (Addendum)
Goals: - Create balanced meals with no more than 1-2 servings (1/2 cup - 1 cup) of starch, 3-4 oz lean protein, and 1-2 servings of non-starchy vegetables.   - Try veggies that you haven't had in a while.  Try flavoring them in new ways with garlic, lemon juice, herbs, and olive oil. - Continue regular exercise, at least 2x/week - Drink a glass of water before every meal - Have an afternoon snack to reduce hunger level at dinner time - Pay attention to your body and your hunger and fullness signals.  Stop eating when satisfied, not when full or stuffed.  You do not need to clean your plate.  Eat without distractions.  Continue to eat slowly.

## 2016-03-15 ENCOUNTER — Ambulatory Visit: Payer: Medicare Other | Admitting: Dietician

## 2016-04-05 ENCOUNTER — Ambulatory Visit: Payer: Medicare Other | Admitting: Dietician

## 2016-04-06 ENCOUNTER — Other Ambulatory Visit (INDEPENDENT_AMBULATORY_CARE_PROVIDER_SITE_OTHER): Payer: Self-pay | Admitting: Specialist

## 2016-04-12 ENCOUNTER — Encounter: Payer: Medicare Other | Attending: Internal Medicine | Admitting: Dietician

## 2016-04-12 DIAGNOSIS — Z713 Dietary counseling and surveillance: Secondary | ICD-10-CM | POA: Insufficient documentation

## 2016-04-12 DIAGNOSIS — I10 Essential (primary) hypertension: Secondary | ICD-10-CM | POA: Diagnosis not present

## 2016-04-12 DIAGNOSIS — IMO0001 Reserved for inherently not codable concepts without codable children: Secondary | ICD-10-CM

## 2016-04-12 DIAGNOSIS — E119 Type 2 diabetes mellitus without complications: Secondary | ICD-10-CM | POA: Insufficient documentation

## 2016-04-12 DIAGNOSIS — Z6841 Body Mass Index (BMI) 40.0 and over, adult: Secondary | ICD-10-CM | POA: Diagnosis not present

## 2016-04-12 NOTE — Patient Instructions (Signed)
-   Create balanced meals with no more than 1-2 servings (1/2 cup - 1 cup) of starch, 3-4 oz lean protein, and 1-2 servings of cooked non-starchy vegetables (try green beans, carrots, celery, etc.)   - Try veggies that you haven't had in a while.  Try flavoring them in new ways with garlic, lemon juice, herbs, and olive oil - Limit flavoring foods with high calorie, high fat foods like shortening, butter, and other animal fats - Only have 1 slice of toast for breakfast on days you stay home - Only have 1 scoop of ice cream in the evening (every other day for now) - Continue regular exercise, at least 2x/week at the gym - Do your home exercises at least 5x/week when not at the gym - Drink a glass of water before every meal - Have an afternoon snack to reduce hunger level at dinner time - Pay attention to your body and your hunger and fullness signals.  Stop eating when satisfied, not when full or stuffed.  You do not need to clean your plate.  Eat without distractions (no television).  Continue to eat slowly. - Pack a sugary food with you when going into town in case of low blood sugar (glucose tablets, peppermints, or other hard candy)

## 2016-04-12 NOTE — Progress Notes (Signed)
Medical Nutrition Therapy:  Appt start time: 1545 end time:  1630.   Assessment:  Primary concerns today: Follow-up for weight loss and diabetes.  Patient self-reports most recent A1c was 6.6%.  Checking her BG multiple times per day.  She has maintained her weight since our last visit 2 months ago.  States she expected to have lost some weight, feels like her clothes are fitting more loosely.  She is still going to the gym about 2x/week, sometimes working with a Systems analyst.  Currently still staying with her sister and her sister's husband but looking into assisted living options or moving back into her old home by herself.  We reviewed her set goals today and focused on specific ways to decrease her caloric intake through portion control, drinking more water, and increase servings of vegetables.  Encouraged her to keep up her regular exercise.  She has a new goal to do her physical therapy exercises at home each day she does not go to the gym.  Wrote all of her goals down for her in her after visit summary.  She plans to review with her sister at home since her sister does the food shopping and cooking for her.    DIETARY INTAKE:  Usual eating pattern includes 2 meals and 2 snacks per day.  Everyday foods include sugar free cookies and sugar free ice cream.  Avoided foods include most non starchy vegetables.  24-hr recall:  B (AM): none Snk ( AM): none  L (11-12 AM):  1 cup instant grits and 2 slices [employer] toast, peanut butter and sugar-free jelly, unsweetened applesauce with splenda, and sugar-free pudding, diet 7-up Snk (2-3 PM):  No added sugar popsicle or sugar-free cookies none D (5-6 PM):   2 chicken nuggets, mashed potatoes, lima beans OR meatloaf, mashed potatoes, green beans OR out to eat to McDonald's or [employer] Snk (10 PM):  1-2 scoops ice cream or sugar free cookies or NABS Beverages: diet 7up, 3-4 cups of water daily, occasionally sweet tea when out to eat or unsweetened with  sweet and low  Usual physical activity: 2x/week goes to the gym, finished physical therapy, plans to increase to 3x/week in the next few months if she moves back to [place]  Goals: - Create balanced meals with no more than 1-2 servings (1/2 cup - 1 cup) of starch, 3-4 oz lean protein, and 1-2 servings of cooked non-starchy vegetables (try green beans, carrots, celery, etc.)   - Try veggies that you haven't had in a while.  Try flavoring them in new ways with garlic, lemon juice, herbs, and olive oil - Limit flavoring foods with high calorie, high fat foods like shortening, butter, and other animal fats - Only have 1 slice of toast for breakfast on days you stay home - Only have 1 scoop of ice cream in the evening (every other day for now) - Continue regular exercise, at least 2x/week at the gym - Do your home exercises at least 5x/week when not at the gym - Drink a glass of water before every meal - Have an afternoon snack to reduce hunger level at dinner time - Pay attention to your body and your hunger and fullness signals.  Stop eating when satisfied, not when full or stuffed.  You do not need to clean your plate.  Eat without distractions (no television).  Continue to eat slowly. - Pack a sugary food with you when going into town in case of low blood sugar (glucose  tablets, peppermints, or other hard candy)  Barriers to learning/adherence to lifestyle change: decreased ability to exercise, dislike of many foods/ limited food preferences  Demonstrated degree of understanding via:  Teach Back   Monitoring/Evaluation:  Dietary intake, exercise, and body weight in 2 month(s).

## 2016-04-16 NOTE — Telephone Encounter (Signed)
Tramadol refill

## 2016-04-17 NOTE — Telephone Encounter (Signed)
Called to [facility] 

## 2016-04-18 ENCOUNTER — Ambulatory Visit (INDEPENDENT_AMBULATORY_CARE_PROVIDER_SITE_OTHER): Payer: Medicare Other | Admitting: Podiatry

## 2016-04-18 ENCOUNTER — Encounter: Payer: Self-pay | Admitting: Podiatry

## 2016-04-18 VITALS — BP 160/85 | HR 73 | Resp 18

## 2016-04-18 DIAGNOSIS — B351 Tinea unguium: Secondary | ICD-10-CM

## 2016-04-18 DIAGNOSIS — E119 Type 2 diabetes mellitus without complications: Secondary | ICD-10-CM

## 2016-04-18 DIAGNOSIS — L84 Corns and callosities: Secondary | ICD-10-CM | POA: Diagnosis not present

## 2016-04-18 DIAGNOSIS — M79676 Pain in unspecified toe(s): Secondary | ICD-10-CM

## 2016-04-18 NOTE — Patient Instructions (Signed)

## 2016-04-18 NOTE — Progress Notes (Signed)
Patient ID: Jodi JarvisDorcas P Chavez, female   DOB: June 24, 1950, 66 y.o.   MRN: [id number]     Subjective: This patient presents today complaining of toenails that are thickened and elongated which or cough walking wearing shoes and requests nail debridement. Also patient complaining of a uncomfortable callus on the plantar right foot  Objective: Orientated 3 No open skin lesions bilaterally DP and PT pulses 2/4 bilaterally Capillary reflex immediate bilaterally Sensation to 10 g monofilament wire intact 5/5 bilaterally Vibratory sensation reactive bilaterally Ankle reflex equal and reactive bilaterally Toenails 8 are elongated, hypertrophic, discolored and tender direct palpation Keratoses medial plantar first MPJ right Patient walks slowly with assistance of roller walker Manual motor testing dorsi flexion, plantar flexion 5/5 bilaterally  Assessment: Type II diabetic Satisfactory neurovascular status Protective sensation intact Symptomatic onychomycose x 8 Keratoses 1  Plan: Debridement toenails 8 mechanically an electricallywithout any bleeding Debrided keratoses 1 without any bleeding  Reappoint 3 months

## 2016-06-14 ENCOUNTER — Ambulatory Visit: Payer: Medicare Other | Admitting: Dietician

## 2016-07-12 ENCOUNTER — Ambulatory Visit: Payer: Medicare Other | Admitting: Dietician

## 2016-07-18 ENCOUNTER — Ambulatory Visit (INDEPENDENT_AMBULATORY_CARE_PROVIDER_SITE_OTHER): Payer: Medicare Other | Admitting: Podiatry

## 2016-07-18 DIAGNOSIS — B351 Tinea unguium: Secondary | ICD-10-CM | POA: Diagnosis not present

## 2016-07-18 DIAGNOSIS — M79676 Pain in unspecified toe(s): Secondary | ICD-10-CM | POA: Diagnosis not present

## 2016-07-18 NOTE — Progress Notes (Signed)
Patient ID: Jodi JarvisDorcas P Chavez, female   DOB: 03/26/1950, 66 y.o.   MRN: [id number]  Subjective: This patient presents today complaining of toenails that are thickened and elongated which or cough walking wearing shoes and requests nail debridement. Also patient complaining of a uncomfortable callus on the plantar right foot  Objective: Orientated 3 No open skin lesions bilaterally Peripheral bilateral pitting edema DP and PT pulses 2/4 bilaterally Capillary reflex immediate bilaterally Sensation to 10 g monofilament wire intact 5/5 bilaterally Vibratory sensation reactive bilaterally Ankle reflex equal and reactive bilaterally Toenails 8 are elongated, hypertrophic, discolored and tender direct palpation Atrophic skin with absent hair growth bilaterally Keratoses medial plantar first MPJ right Patient walks slowly with assistance of roller walker Manual motor testing dorsi flexion, plantar flexion 5/5 bilaterally  Assessment: Type II diabetic Satisfactory neurovascular status Protective sensation intact Symptomatic onychomycose x 8 Keratoses 1  Plan: Debridement toenails 8 mechanically an electricallywithout any bleeding Debrided keratoses 1 without any bleeding  Reappoint 3 months

## 2016-07-18 NOTE — Patient Instructions (Signed)

## 2016-07-26 ENCOUNTER — Ambulatory Visit: Payer: Medicare Other | Admitting: Dietician

## 2016-07-27 ENCOUNTER — Emergency Department (HOSPITAL_COMMUNITY)
Admission: EM | Admit: 2016-07-27 | Discharge: 2016-07-27 | Disposition: A | Discharge status: Home / Self Care | Payer: Medicare Other | Attending: Emergency Medicine | Admitting: Emergency Medicine

## 2016-07-27 ENCOUNTER — Encounter (HOSPITAL_COMMUNITY): Payer: Self-pay

## 2016-07-27 DIAGNOSIS — R55 Syncope and collapse: Secondary | ICD-10-CM | POA: Diagnosis present

## 2016-07-27 DIAGNOSIS — Z7984 Long term (current) use of oral hypoglycemic drugs: Secondary | ICD-10-CM | POA: Insufficient documentation

## 2016-07-27 DIAGNOSIS — E860 Dehydration: Secondary | ICD-10-CM | POA: Diagnosis not present

## 2016-07-27 DIAGNOSIS — I10 Essential (primary) hypertension: Secondary | ICD-10-CM | POA: Insufficient documentation

## 2016-07-27 DIAGNOSIS — Z79899 Other long term (current) drug therapy: Secondary | ICD-10-CM | POA: Diagnosis not present

## 2016-07-27 DIAGNOSIS — E1140 Type 2 diabetes mellitus with diabetic neuropathy, unspecified: Secondary | ICD-10-CM | POA: Diagnosis not present

## 2016-07-27 DIAGNOSIS — I951 Orthostatic hypotension: Secondary | ICD-10-CM | POA: Insufficient documentation

## 2016-07-27 DIAGNOSIS — N179 Acute kidney failure, unspecified: Secondary | ICD-10-CM | POA: Diagnosis not present

## 2016-07-27 LAB — URINALYSIS, ROUTINE W REFLEX MICROSCOPIC
Bilirubin Urine: NEGATIVE
Glucose, UA: NEGATIVE mg/dL
Hgb urine dipstick: NEGATIVE
KETONES UR: NEGATIVE mg/dL
Nitrite: NEGATIVE
PROTEIN: NEGATIVE mg/dL
Specific Gravity, Urine: 1.024 (ref 1.005–1.030)
pH: 5 (ref 5.0–8.0)

## 2016-07-27 LAB — BASIC METABOLIC PANEL
ANION GAP: 9 (ref 5–15)
BUN: 20 mg/dL (ref 6–20)
CO2: 27 mmol/L (ref 22–32)
Calcium: 9.7 mg/dL (ref 8.9–10.3)
Chloride: 100 mmol/L — ABNORMAL LOW (ref 101–111)
Creatinine, Ser: 1.43 mg/dL — ABNORMAL HIGH (ref 0.44–1.00)
GFR calc Af Amer: 44 mL/min — ABNORMAL LOW (ref >60)
GFR calc non Af Amer: 38 mL/min — ABNORMAL LOW (ref >60)
GLUCOSE: 141 mg/dL — AB (ref 65–99)
POTASSIUM: 4.4 mmol/L (ref 3.5–5.1)
Sodium: 136 mmol/L (ref 135–145)

## 2016-07-27 LAB — CBC
HEMATOCRIT: 43.2 % (ref 36.0–46.0)
HEMOGLOBIN: 14.5 g/dL (ref 12.0–15.0)
MCH: 32.7 pg (ref 26.0–34.0)
MCHC: 33.6 g/dL (ref 30.0–36.0)
MCV: 97.3 fL (ref 78.0–100.0)
Platelets: 196 10*3/uL (ref 150–400)
RBC: 4.44 MIL/uL (ref 3.87–5.11)
RDW: 12.8 % (ref 11.5–15.5)
WBC: 12.9 10*3/uL — ABNORMAL HIGH (ref 4.0–10.5)

## 2016-07-27 LAB — CBG MONITORING, ED: GLUCOSE-CAPILLARY: 101 mg/dL — AB (ref 65–99)

## 2016-07-27 MED ORDER — SODIUM CHLORIDE 0.9 % IV BOLUS (SEPSIS)
1000.0000 mL | Freq: Once | INTRAVENOUS | Status: AC
  Administered 2016-07-27: 1000 mL via INTRAVENOUS

## 2016-07-27 NOTE — ED Notes (Signed)
Pt verbalized understanding of d/c instructions and has no further questions. Pt is stable, A&Ox4, VSS.  

## 2016-07-27 NOTE — ED Triage Notes (Signed)
Per EMS, pt was getting her hair done and got overheated and felt lightheaded, no loc. Pale on scene lying on her side on a bench. No orthostatic changes. Pt took her own phenergen before EMS arrived, nausea subsided. VSS. Pt still feels lightheaded. VS 146, 98,% on RA, HR 70 NSR, RR 18, CBG 140.

## 2016-07-27 NOTE — ED Provider Notes (Signed)
[facility]-EMERGENCY DEPT Provider Note   CSN: [id number] Arrival date & time: 07/27/16  1625     History   Chief Complaint Chief Complaint  Patient presents with  . Near Syncope    HPI Jodi Chavez is a 66 y.o. female.  HPI Pt with hx of HTN, DM, HL comes in after near syncope. Pt was at a beauty salon and was getting her hair dried when she suddenly started feeling dizzy, hot and she nearly fainted. Pt's head was in a normal position when she was getting her hair dry. When pt stood up she started feeling sick. No chest pain, dib, palpitations. Pt did have diaphoresis and felt sick to her stomach and nauseated. Pt denies diarrhea or emesis. She has been eating well.  Pt has no hx of PE, DVT and denies any exogenous hormone (testosterone / estrogen) use, long distance travels or surgery in the past 6 weeks, active cancer, recent immobilization.   Past Medical History:  Diagnosis Date  . Arthritis    hip  . Constipation    uses laxatives several times a week  . Depression   . Diabetes mellitus    takes Metformin and Amaryl daily  . Dizziness    occasionally and related to meds   . Early cataracts, bilateral   . History of bronchitis    last time several yrs ago  . History of migraine    many yrs ago  . Hyperlipidemia    takes Zetia and Zocor daily  . Hypertension    takes Benazepril nightly and Propranolol tid and Clonidine daily  . Insomnia   . Low back pain   . Peripheral edema   . Peripheral neuropathy   . PONV (postoperative nausea and vomiting)   . Slow urinary stream    occasionally    Patient Active Problem List   Diagnosis Date Noted  . HLD (hyperlipidemia) 11/21/2014  . Depression 11/21/2014  . Nausea & vomiting 11/21/2014  . Sepsis (HCC) 11/21/2014  . Coughing 11/21/2014  . UTI (lower urinary tract infection) 11/21/2014  . AKI (acute kidney injury) (HCC) 11/21/2014  . Nausea with vomiting   . Cervical spondylosis with radiculopathy 10/15/2011   Class: Chronic  . HNP (herniated nucleus pulposus), cervical 10/15/2011    Class: Chronic  . Diabetes mellitus without complication (HCC) 09/30/2006  . Essential hypertension 09/30/2006  . DILATION AND CURETTAGE, HX OF 09/30/2006    Past Surgical History:  Procedure Laterality Date  . COLONOSCOPY    . d&c/hysteroscopy/ablation    . DILATION AND CURETTAGE OF UTERUS     couple of times  . ESOPHAGOGASTRODUODENOSCOPY    . 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 POSTERIOR CERVICAL FUSION/FORAMINOTOMY  10/15/2011   Procedure: POSTERIOR CERVICAL FUSION/FORAMINOTOMY LEVEL 2;  Surgeon: Kerrin Champagne, MD;  Location: [facility] OR;  Service: Orthopedics;  Laterality: N/A;  Right C6-7, C7-T1 Foraminotomy with excision HNP C7-T1  . UPPER GASTROINTESTINAL ENDOSCOPY      OB History    No data available       Home Medications    Prior to Admission medications   Medication Sig Start Date End Date Taking? Authorizing Provider  acetaminophen (TYLENOL) 500 MG tablet Take 500 mg by mouth every 6 (six) hours as needed for pain.   Yes [provider]  benazepril (LOTENSIN) 40 MG tablet Take 40 mg by mouth every evening.    Yes [provider]  calcium-vitamin D (OSCAL WITH D)  500-200 MG-UNIT per tablet Take 1 tablet by mouth daily.    Yes [provider]  cloNIDine (CATAPRES) 0.2 MG tablet Take 0.2 mg by mouth 2 (two) times daily.    Yes [provider]  diphenhydrAMINE (BENADRYL) 25 MG tablet Take 1 tablet (25 mg total) by mouth every 6 (six) hours as needed for itching. 11/25/14  Yes Rai, Ripudeep K, MD  doxepin (SINEQUAN) 50 MG capsule Take 100 mg by mouth at bedtime.   Yes [provider]  ezetimibe (ZETIA) 10 MG tablet Take 10 mg by mouth daily.   Yes [provider]  fluticasone (FLONASE) 50 MCG/ACT nasal spray Place 2 sprays into both nostrils daily.  06/11/14  Yes [provider]  gabapentin (NEURONTIN) 300 MG capsule  Take 300 mg by mouth 3 (three) times daily.   Yes [provider]  Garlic 1000 MG CAPS Take 1 capsule by mouth 3 (three) times daily.   Yes [provider]  glimepiride (AMARYL) 2 MG tablet Take 2 mg by mouth daily before breakfast.   Yes [provider]  Glucosamine-Chondroit-Vit C-Mn (GLUCOSAMINE CHONDR 1500 COMPLX PO) Take by mouth.   Yes [provider]  hydrochlorothiazide (HYDRODIURIL) 25 MG tablet Take 25 mg by mouth daily.   Yes [provider]  loratadine (CLARITIN) 10 MG tablet Take 10 mg by mouth daily. Reported on 07/12/2015   Yes [provider]  meloxicam (MOBIC) 7.5 MG tablet Take 7.5 mg by mouth 2 (two) times daily as needed for pain. Reported on 07/18/2015   Yes [provider]  metFORMIN (GLUCOPHAGE) 850 MG tablet Take 850 mg by mouth daily with breakfast.   Yes [provider]  Multiple Vitamin (MULITIVITAMIN WITH MINERALS) TABS Take 1 tablet by mouth daily.   Yes [provider]  OVER THE COUNTER MEDICATION Apply 1 application topically daily. sween cream applied to groin area daily to prevent rash   Yes [provider]  pantoprazole (PROTONIX) 40 MG tablet Take 1 tablet (40 mg total) by mouth daily. 11/25/14  Yes Rai, Ripudeep K, MD  polyethylene glycol (MIRALAX / GLYCOLAX) packet Take 17 g by mouth daily. 11/25/14  Yes Rai, Ripudeep K, MD  PROAIR HFA 108 (90 BASE) MCG/ACT inhaler Inhale 2 puffs into the lungs every 4 (four) hours as needed.  06/11/14  Yes [provider]  promethazine (PHENERGAN) 25 MG tablet Take 1 tablet (25 mg total) by mouth every 6 (six) hours as needed for nausea. 11/25/14  Yes Rai, Ripudeep K, MD  propranolol (INDERAL) 10 MG tablet Take 20 mg by mouth 3 (three) times daily.    Yes [provider]  Sennosides 25 MG TABS Take 2 tablets by mouth as needed. Pt takes every other day to every 3 days   Yes [provider]  simvastatin (ZOCOR) 40 MG  tablet Take 40 mg by mouth every evening.   Yes [provider]  traMADol (ULTRAM) 50 MG tablet TAKE ONE-HALF TO ONE TABLET BY MOUTH EVERY 4 TO 6 HOURS AS NEEDED FOR PAIN 04/17/16  Yes Kerrin Champagne, MD  dextromethorphan-guaiFENesin [facility] DM) 30-600 MG 12hr tablet Take 1 tablet by mouth 2 (two) times daily. Patient not taking: Reported on 07/27/2016 11/25/14   Rai, Delene Ruffini, MD  magnesium oxide (MAG-OX) 400 (241.3 MG) MG tablet Take 1 tablet (400 mg total) by mouth 2 (two) times daily. Patient not taking: Reported on 07/27/2016 11/25/14   Cathren Harsh, MD  Family History Family History  Problem Relation Age of Onset  . Cancer Mother   . Stroke Mother   . Diabetes Mother   . Hypertension Mother   . Colon cancer Mother [id number]       died 2007  . Gout Father   . Heart disease Father     Social History Social History  Substance Use Topics  . Smoking status: Never Smoker  . Smokeless tobacco: Never Used  . Alcohol use No     Allergies   Naproxen sodium   Review of Systems Review of Systems  Constitutional: Positive for activity change and fatigue.  Respiratory: Negative for shortness of breath.   Cardiovascular: Negative for chest pain and palpitations.  Gastrointestinal: Negative for abdominal pain, nausea and vomiting.  Genitourinary: Negative for dysuria.  Musculoskeletal: Negative for neck pain.  Neurological: Positive for dizziness and light-headedness. Negative for tremors, seizures, syncope, speech difficulty, weakness, numbness and headaches.     Physical Exam Updated Vital Signs BP (!) 161/77   Pulse 67   Temp 98.4 F (36.9 C) (Oral)   Resp 18   SpO2 96%   Physical Exam  Constitutional: She is oriented to person, place, and time. She appears well-developed and well-nourished.  HENT:  Head: Normocephalic and atraumatic.  Eyes: EOM are normal. Pupils are equal, round, and reactive to light.  Neck: Neck supple. No JVD present.  No carotid bruit    Cardiovascular: Normal rate, regular rhythm and normal heart sounds.   Pulmonary/Chest: Effort normal. No respiratory distress.  Abdominal: Soft. She exhibits no distension and no mass. There is no tenderness.  Musculoskeletal: She exhibits no edema or tenderness.  Neurological: She is alert and oriented to person, place, and time.  Skin: Skin is warm and dry.  Nursing note and vitals reviewed.    ED Treatments / Results  Labs (all labs ordered are listed, but only abnormal results are displayed) Labs Reviewed  BASIC METABOLIC PANEL - Abnormal; Notable for the following:       Result Value   Chloride 100 (*)    Glucose, Bld 141 (*)    Creatinine, Ser 1.43 (*)    GFR calc non Af Amer 38 (*)    GFR calc Af Amer 44 (*)    All other components within normal limits  CBC - Abnormal; Notable for the following:    WBC 12.9 (*)    All other components within normal limits  URINALYSIS, ROUTINE W REFLEX MICROSCOPIC - Abnormal; Notable for the following:    Color, Urine AMBER (*)    Leukocytes, UA MODERATE (*)    Bacteria, UA RARE (*)    Squamous Epithelial / LPF 0-5 (*)    All other components within normal limits  CBG MONITORING, ED - Abnormal; Notable for the following:    Glucose-Capillary 101 (*)    All other components within normal limits    EKG  EKG Interpretation  Date/Time:  Friday July 27 2016 16:27:41 EDT Ventricular Rate:  71 PR Interval:    QRS Duration: 88 QT Interval:  396 QTC Calculation: 431 R Axis:   20 Text Interpretation:  Sinus rhythm Low voltage, precordial leads No acute changes No significant change since last tracing Confirmed by Derwood KaplanNanavati, Tyrome Donatelli [phone]([id number]) on 07/27/2016 5:08:41 PM       Radiology No results found.  Procedures Procedures (including critical care time)  Medications Ordered in ED Medications  sodium chloride 0.9 % bolus 1,000 mL (0 mLs Intravenous Stopped 07/27/16  1912)     Initial Impression / Assessment and Plan / ED Course  I  have reviewed the triage vital signs and the nursing notes.  Pertinent labs & imaging results that were available during my care of the patient were reviewed by me and considered in my medical decision making (see chart for details).  Clinical Course as of Jul 28 2147  Fri Jul 27, 2016  2147 Results from the ER workup discussed with the patient face to face and all questions answered to the best of my ability.  I ambulated pt with a walker, and she was steady. Stable for d/c. Strict ER return precautions have been discussed, and patient is agreeing with the plan and is comfortable with the workup done and the recommendations from the ER.   [AN]    Clinical Course User Index [AN] Derwood Kaplan, MD    DDx includes: Orthostatic hypotension Stroke Vertebral artery dissection/stenosis Dysrhythmia PE Vasovagal/neurocardiogenic syncope Aortic stenosis Valvular disorder/Cardiomyopathy Anemia  Pt comes in with near syncope.  She had vasovagal type episode earlier. PT reports that she doesn't have AC and she was in a hot care for a long time, and then when she was at the salon and got up, that's when the prodrome hit her followed by near fainting like spell.  Exam overall is reassuring. EKG is reassuring. Pt has no cardiac hx at all. I suspect some dehydration / orthostatic / vasovagal type event. Tele monitoring ordered.   Final Clinical Impressions(s) / ED Diagnoses   Final diagnoses:  Near syncope  Vasovagal episode  AKI (acute kidney injury) (HCC)  Orthostatic syncope  Dehydration    New Prescriptions Discharge Medication List as of 07/27/2016  8:01 PM       Derwood Kaplan, MD 07/27/16 2149

## 2016-07-27 NOTE — Discharge Instructions (Signed)
The labs here show some dehydration. We gave you fluids. Your kidney enzymes are slightly bumped, and your doctor will need to repeat your Cr level again in a week. In the interim, hydrate well, and be careful with moving around to prevent falls.  Return to the ER if you faint or feel like your symptoms are getting worse.

## 2016-07-27 NOTE — ED Notes (Signed)
Pt able to ambulate with little trouble. Pt on the phone trying to find a ride. Pt will go out to the lobby after getting off the phone to wait for her ride

## 2016-07-27 NOTE — ED Notes (Addendum)
Pt states she is not feeling good at the moment. MD notified

## 2016-08-16 ENCOUNTER — Ambulatory Visit: Payer: Medicare Other | Admitting: Dietician

## 2016-08-23 ENCOUNTER — Ambulatory Visit: Payer: Medicare Other | Admitting: Dietician

## 2016-09-07 ENCOUNTER — Encounter (HOSPITAL_COMMUNITY): Payer: Self-pay | Admitting: Emergency Medicine

## 2016-09-07 ENCOUNTER — Emergency Department (HOSPITAL_COMMUNITY)
Admission: EM | Admit: 2016-09-07 | Discharge: 2016-09-07 | Disposition: A | Discharge status: Home / Self Care | Payer: Medicare Other | Attending: Emergency Medicine | Admitting: Emergency Medicine

## 2016-09-07 DIAGNOSIS — T676XXA Heat fatigue, transient, initial encounter: Secondary | ICD-10-CM | POA: Insufficient documentation

## 2016-09-07 DIAGNOSIS — Y999 Unspecified external cause status: Secondary | ICD-10-CM | POA: Insufficient documentation

## 2016-09-07 DIAGNOSIS — Z7984 Long term (current) use of oral hypoglycemic drugs: Secondary | ICD-10-CM | POA: Insufficient documentation

## 2016-09-07 DIAGNOSIS — E119 Type 2 diabetes mellitus without complications: Secondary | ICD-10-CM | POA: Diagnosis not present

## 2016-09-07 DIAGNOSIS — Y929 Unspecified place or not applicable: Secondary | ICD-10-CM | POA: Diagnosis not present

## 2016-09-07 DIAGNOSIS — Z79899 Other long term (current) drug therapy: Secondary | ICD-10-CM | POA: Diagnosis not present

## 2016-09-07 DIAGNOSIS — T679XXA Effect of heat and light, unspecified, initial encounter: Secondary | ICD-10-CM

## 2016-09-07 DIAGNOSIS — R531 Weakness: Secondary | ICD-10-CM | POA: Diagnosis present

## 2016-09-07 DIAGNOSIS — Y9301 Activity, walking, marching and hiking: Secondary | ICD-10-CM | POA: Insufficient documentation

## 2016-09-07 LAB — CK: CK TOTAL: 101 U/L (ref 38–234)

## 2016-09-07 LAB — COMPREHENSIVE METABOLIC PANEL
ALBUMIN: 4 g/dL (ref 3.5–5.0)
ALK PHOS: 74 U/L (ref 38–126)
ALT: 36 U/L (ref 14–54)
ANION GAP: 10 (ref 5–15)
AST: 44 U/L — ABNORMAL HIGH (ref 15–41)
BUN: 21 mg/dL — ABNORMAL HIGH (ref 6–20)
CHLORIDE: 99 mmol/L — AB (ref 101–111)
CO2: 26 mmol/L (ref 22–32)
CREATININE: 1.36 mg/dL — AB (ref 0.44–1.00)
Calcium: 9.6 mg/dL (ref 8.9–10.3)
GFR calc non Af Amer: 40 mL/min — ABNORMAL LOW (ref >60)
GFR, EST AFRICAN AMERICAN: 47 mL/min — AB (ref >60)
GLUCOSE: 126 mg/dL — AB (ref 65–99)
Potassium: 5 mmol/L (ref 3.5–5.1)
SODIUM: 135 mmol/L (ref 135–145)
Total Bilirubin: 0.8 mg/dL (ref 0.3–1.2)
Total Protein: 7.2 g/dL (ref 6.5–8.1)

## 2016-09-07 LAB — URINALYSIS, ROUTINE W REFLEX MICROSCOPIC
GLUCOSE, UA: NEGATIVE mg/dL
HGB URINE DIPSTICK: NEGATIVE
KETONES UR: 5 mg/dL — AB
NITRITE: NEGATIVE
PROTEIN: NEGATIVE mg/dL
Specific Gravity, Urine: 1.03 (ref 1.005–1.030)
pH: 5 (ref 5.0–8.0)

## 2016-09-07 LAB — CBC WITH DIFFERENTIAL/PLATELET
BASOS ABS: 0 10*3/uL (ref 0.0–0.1)
BASOS PCT: 0 %
EOS ABS: 0.2 10*3/uL (ref 0.0–0.7)
EOS PCT: 1 %
HCT: 41.3 % (ref 36.0–46.0)
HEMOGLOBIN: 14.2 g/dL (ref 12.0–15.0)
LYMPHS ABS: 1.7 10*3/uL (ref 0.7–4.0)
Lymphocytes Relative: 13 %
MCH: 33.1 pg (ref 26.0–34.0)
MCHC: 34.4 g/dL (ref 30.0–36.0)
MCV: 96.3 fL (ref 78.0–100.0)
Monocytes Absolute: 0.8 10*3/uL (ref 0.1–1.0)
Monocytes Relative: 6 %
NEUTROS PCT: 80 %
Neutro Abs: 9.8 10*3/uL — ABNORMAL HIGH (ref 1.7–7.7)
PLATELETS: 218 10*3/uL (ref 150–400)
RBC: 4.29 MIL/uL (ref 3.87–5.11)
RDW: 12.9 % (ref 11.5–15.5)
WBC: 12.5 10*3/uL — AB (ref 4.0–10.5)

## 2016-09-07 MED ORDER — SODIUM CHLORIDE 0.9 % IV BOLUS (SEPSIS)
1000.0000 mL | Freq: Once | INTRAVENOUS | Status: AC
  Administered 2016-09-07: 1000 mL via INTRAVENOUS

## 2016-09-07 MED ORDER — SODIUM CHLORIDE 0.9 % IV SOLN
INTRAVENOUS | Status: DC
Start: 2016-09-07 — End: 2016-09-07

## 2016-09-07 NOTE — ED Notes (Signed)
ED Provider at bedside. 

## 2016-09-07 NOTE — ED Triage Notes (Signed)
Pt was walking around her neighborhood for 30 minutes out in the heat wearing long pants. The pt usually does not exert herself in this fashion, and has not been drinking water all day, per her own report. The pt denied LOC, but complained of weakness all over. No trauma noted. The pt was hot to the touch and diaphoretic. Vitals 126/63, HR 70, CBG 135, O2 96%.

## 2016-09-07 NOTE — ED Provider Notes (Signed)
[facility]-EMERGENCY DEPT Provider Note   CSN: [id number] Arrival date & time: 09/07/16  1521     History   Chief Complaint Chief Complaint  Patient presents with  . Weakness    After walking outside in heat    HPI Jodi Chavez is a 66 y.o. female.  66 year old female presents with weakness that began when she was walking outside. Stated that she started to develop muscle aches but denies any vomiting. Was drinking liquids and normally does not infiltrate very much. Does use a walker. Denies any change in color to her urine. No use of alcohol today. Has been outside for about 30 minutes and wasn't heavy clothing. Was in a baseline state of health until going outside. Flow better when she went inside. No recent cough or congestion.      Past Medical History:  Diagnosis Date  . Arthritis    hip  . Constipation    uses laxatives several times a week  . Depression   . Diabetes mellitus    takes Metformin and Amaryl daily  . Dizziness    occasionally and related to meds   . Early cataracts, bilateral   . History of bronchitis    last time several yrs ago  . History of migraine    many yrs ago  . Hyperlipidemia    takes Zetia and Zocor daily  . Hypertension    takes Benazepril nightly and Propranolol tid and Clonidine daily  . Insomnia   . Low back pain   . Peripheral edema   . Peripheral neuropathy   . PONV (postoperative nausea and vomiting)   . Slow urinary stream    occasionally    Patient Active Problem List   Diagnosis Date Noted  . HLD (hyperlipidemia) 11/21/2014  . Depression 11/21/2014  . Nausea & vomiting 11/21/2014  . Sepsis (HCC) 11/21/2014  . Coughing 11/21/2014  . UTI (lower urinary tract infection) 11/21/2014  . AKI (acute kidney injury) (HCC) 11/21/2014  . Nausea with vomiting   . 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 HNP (herniated nucleus pulposus), cervical 10/15/2011    Class: Chronic  . Diabetes mellitus  without complication (HCC) 09/30/2006  . Essential hypertension 09/30/2006  . DILATION AND CURETTAGE, HX OF 09/30/2006    Past Surgical History:  Procedure Laterality Date  . COLONOSCOPY    . d&c/hysteroscopy/ablation    . DILATION AND CURETTAGE OF UTERUS     couple of times  . ESOPHAGOGASTRODUODENOSCOPY    . 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 POSTERIOR CERVICAL FUSION/FORAMINOTOMY  10/15/2011   Procedure: POSTERIOR CERVICAL FUSION/FORAMINOTOMY LEVEL 2;  Surgeon: Kerrin ChampagneJames E Nitka, MD;  Location: [facility] OR;  Service: Orthopedics;  Laterality: N/A;  Right C6-7, C7-T1 Foraminotomy with excision HNP C7-T1  . UPPER GASTROINTESTINAL ENDOSCOPY      OB History    No data available       Home Medications    Prior to Admission medications   Medication Sig Start Date End Date Taking? Authorizing Provider  acetaminophen (TYLENOL) 500 MG tablet Take 500 mg by mouth every 6 (six) hours as needed for pain.    [provider]  benazepril (LOTENSIN) 40 MG tablet Take 40 mg by mouth every evening.     [provider]  calcium-vitamin D (OSCAL WITH D) 500-200 MG-UNIT per tablet Take 1 tablet by mouth daily.     [provider]  cloNIDine (CATAPRES) 0.2  MG tablet Take 0.2 mg by mouth 2 (two) times daily.     [provider]  dextromethorphan-guaiFENesin (MUCINEX DM) 30-600 MG 12hr tablet Take 1 tablet by mouth 2 (two) times daily. Patient not taking: Reported on 07/27/2016 11/25/14   Cathren Harsh, MD  diphenhydrAMINE (BENADRYL) 25 MG tablet Take 1 tablet (25 mg total) by mouth every 6 (six) hours as needed for itching. 11/25/14   Rai, Ripudeep K, MD  doxepin (SINEQUAN) 50 MG capsule Take 100 mg by mouth at bedtime.    [provider]  ezetimibe (ZETIA) 10 MG tablet Take 10 mg by mouth daily.    [provider]  fluticasone (FLONASE) 50 MCG/ACT nasal spray Place 2 sprays into both nostrils daily.  06/11/14   [provider]  gabapentin (NEURONTIN) 300 MG capsule Take 300 mg by mouth 3 (three) times daily.    [provider]  Garlic 1000 MG CAPS Take 1 capsule by mouth 3 (three) times daily.    [provider]  glimepiride (AMARYL) 2 MG tablet Take 2 mg by mouth daily before breakfast.    [provider]  Glucosamine-Chondroit-Vit C-Mn (GLUCOSAMINE CHONDR 1500 COMPLX PO) Take by mouth.    [provider]  hydrochlorothiazide (HYDRODIURIL) 25 MG tablet Take 25 mg by mouth daily.    [provider]  loratadine (CLARITIN) 10 MG tablet Take 10 mg by mouth daily. Reported on 07/12/2015    [provider]  magnesium oxide (MAG-OX) 400 (241.3 MG) MG tablet Take 1 tablet (400 mg total) by mouth 2 (two) times daily. Patient not taking: Reported on 07/27/2016 11/25/14   Rai, Delene Ruffini, MD  meloxicam (MOBIC) 7.5 MG tablet Take 7.5 mg by mouth 2 (two) times daily as needed for pain. Reported on 07/18/2015    [provider]  metFORMIN (GLUCOPHAGE) 850 MG tablet Take 850 mg by mouth daily with breakfast.    [provider]  Multiple Vitamin (MULITIVITAMIN WITH MINERALS) TABS Take 1 tablet by mouth daily.    [provider]  OVER THE COUNTER MEDICATION Apply 1 application topically daily. sween cream applied to groin area daily to prevent rash    [provider]  pantoprazole (PROTONIX) 40 MG tablet Take 1 tablet (40 mg total) by mouth daily. 11/25/14   Rai, Delene Ruffini, MD  polyethylene glycol (MIRALAX / GLYCOLAX) packet Take 17 g by mouth daily. 11/25/14   Rai, Ripudeep Kirtland Bouchard, MD  PROAIR HFA 108 (90 BASE) MCG/ACT inhaler Inhale 2 puffs into the lungs every 4 (four) hours as needed.  06/11/14   [provider]  promethazine (PHENERGAN) 25 MG tablet Take 1 tablet (25 mg total) by mouth every 6 (six) hours as needed for nausea. 11/25/14   Rai, Delene Ruffini, MD  propranolol (INDERAL) 10 MG tablet Take 20 mg by mouth 3 (three) times daily.      [provider]  Sennosides 25 MG TABS Take 2 tablets by mouth as needed. Pt takes every other day to every 3 days    [provider]  simvastatin (ZOCOR) 40 MG tablet Take 40 mg by mouth every evening.    [provider]  traMADol (ULTRAM) 50 MG tablet TAKE ONE-HALF TO ONE TABLET BY MOUTH EVERY 4 TO 6 HOURS AS NEEDED FOR PAIN 04/17/16   Kerrin Champagne, MD    Family History Family History  Problem Relation Age of Onset  . Cancer Mother   . Stroke Mother   .  Diabetes Mother   . Hypertension Mother   . Colon cancer Mother 28       died 07/15/05  . Gout Father   . Heart disease Father     Social History Social History  Substance Use Topics  . Smoking status: Never Smoker  . Smokeless tobacco: Never Used  . Alcohol use No     Allergies   Naproxen sodium   Review of Systems Review of Systems  All other systems reviewed and are negative.    Physical Exam Updated Vital Signs BP 133/74 (BP Location: Left Arm)   Pulse 74   Temp 97.9 F (36.6 C) (Oral)   Resp 20   Ht 1.549 m (5\' 1" )   Wt 104.3 kg (230 lb)   SpO2 94%   BMI 43.46 kg/m   Physical Exam  Constitutional: She is oriented to person, place, and time. She appears well-developed and well-nourished.  Non-toxic appearance. No distress.  HENT:  Head: Normocephalic and atraumatic.  Eyes: Pupils are equal, round, and reactive to light. Conjunctivae, EOM and lids are normal.  Neck: Normal range of motion. Neck supple. No tracheal deviation present. No thyroid mass present.  Cardiovascular: Normal rate, regular rhythm and normal heart sounds.  Exam reveals no gallop.   No murmur heard. Pulmonary/Chest: Effort normal and breath sounds normal. No stridor. No respiratory distress. She has no decreased breath sounds. She has no wheezes. She has no rhonchi. She has no rales.  Abdominal: Soft. Normal appearance and bowel sounds are normal. She exhibits no distension. There is no tenderness. There  is no rebound and no CVA tenderness.  Musculoskeletal: Normal range of motion. She exhibits no edema or tenderness.  Neurological: She is alert and oriented to person, place, and time. She has normal strength. No cranial nerve deficit or sensory deficit. GCS eye subscore is 4. GCS verbal subscore is 5. GCS motor subscore is 6.  Skin: Skin is warm and dry. No abrasion and no rash noted.  Psychiatric: She has a normal mood and affect. Her speech is normal and behavior is normal.  Nursing note and vitals reviewed.    ED Treatments / Results  Labs (all labs ordered are listed, but only abnormal results are displayed) Labs Reviewed - No data to display  EKG  EKG Interpretation None       Radiology No results found.  Procedures Procedures (including critical care time)  Medications Ordered in ED Medications - No data to display   Initial Impression / Assessment and Plan / ED Course  I have reviewed the triage vital signs and the nursing notes.  Pertinent labs & imaging results that were available during my care of the patient were reviewed by me and considered in my medical decision making (see chart for details).     Patient given IV fluids and feels better. Suspect heat illness and patients for discharge  Final Clinical Impressions(s) / ED Diagnoses   Final diagnoses:  None    New Prescriptions New Prescriptions   No medications on file     Lorre Nick, MD 09/07/16 Jul 16, 2019

## 2016-10-10 ENCOUNTER — Other Ambulatory Visit (INDEPENDENT_AMBULATORY_CARE_PROVIDER_SITE_OTHER): Payer: Self-pay | Admitting: Specialist

## 2016-10-11 NOTE — Telephone Encounter (Signed)
Can you please advise? Dr. Nitka is out of the office this week. 

## 2016-10-17 ENCOUNTER — Ambulatory Visit: Payer: Medicare Other | Admitting: Podiatry

## 2016-11-19 ENCOUNTER — Encounter: Payer: Self-pay | Admitting: Podiatry

## 2016-11-19 ENCOUNTER — Ambulatory Visit (INDEPENDENT_AMBULATORY_CARE_PROVIDER_SITE_OTHER): Payer: Medicare Other | Admitting: Podiatry

## 2016-11-19 DIAGNOSIS — M79676 Pain in unspecified toe(s): Secondary | ICD-10-CM | POA: Diagnosis not present

## 2016-11-19 DIAGNOSIS — E119 Type 2 diabetes mellitus without complications: Secondary | ICD-10-CM

## 2016-11-19 DIAGNOSIS — B351 Tinea unguium: Secondary | ICD-10-CM | POA: Diagnosis not present

## 2016-11-19 NOTE — Patient Instructions (Signed)

## 2016-11-19 NOTE — Progress Notes (Signed)
Patient ID: Jodi Chavez, female   DOB: 1951/02/05, 66 y.o.   MRN: [id number]    Subjective: This patient presents today complaining of toenails that are thickened and elongated which or cough walking wearing shoes and requests nail debridement. Also patient complaining of a uncomfortable callus on the plantar right foot  Objective: Orientated 3 No open skin lesions bilaterally Peripheral bilateral pitting edema DP and PT pulses 2/4 bilaterally Capillary reflex immediate bilaterally Sensation to 10 g monofilament wire intact 5/5 bilaterally Vibratory sensation reactive bilaterally Ankle reflex equal and reactive bilaterally Toenails 8 are elongated, hypertrophic, discolored and tender direct palpation Atrophic skin with absent hair growth bilaterally Keratoses medial plantar first MPJ right Patient walks slowly with assistance of roller walker Manual motor testing dorsi flexion, plantar flexion 5/5 bilaterally  Assessment: Type II diabetic Satisfactory neurovascular status Protective sensation intact Symptomatic onychomycose x 8 Keratoses 1  Plan: Debridement toenails 8 mechanically an electricallywithout any bleeding Debrided keratoses 1 without any bleeding  Reappoint 3 months

## 2017-02-11 ENCOUNTER — Ambulatory Visit: Payer: Medicare Other | Admitting: Podiatry

## 2017-03-11 ENCOUNTER — Ambulatory Visit: Payer: Medicare Other | Admitting: Podiatry

## 2017-03-25 ENCOUNTER — Ambulatory Visit: Payer: Medicare Other | Admitting: Podiatry

## 2017-04-08 ENCOUNTER — Ambulatory Visit: Payer: Medicare Other | Admitting: Podiatry

## 2017-04-22 ENCOUNTER — Ambulatory Visit (INDEPENDENT_AMBULATORY_CARE_PROVIDER_SITE_OTHER): Payer: Medicare Other | Admitting: Podiatry

## 2017-04-22 ENCOUNTER — Encounter: Payer: Self-pay | Admitting: Podiatry

## 2017-04-22 DIAGNOSIS — M79674 Pain in right toe(s): Secondary | ICD-10-CM | POA: Diagnosis not present

## 2017-04-22 DIAGNOSIS — B351 Tinea unguium: Secondary | ICD-10-CM

## 2017-04-22 DIAGNOSIS — E1149 Type 2 diabetes mellitus with other diabetic neurological complication: Secondary | ICD-10-CM

## 2017-04-22 DIAGNOSIS — M79675 Pain in left toe(s): Secondary | ICD-10-CM

## 2017-04-22 DIAGNOSIS — L84 Corns and callosities: Secondary | ICD-10-CM

## 2017-04-22 DIAGNOSIS — E1140 Type 2 diabetes mellitus with diabetic neuropathy, unspecified: Secondary | ICD-10-CM

## 2017-04-23 NOTE — Progress Notes (Signed)
Subjective:   Patient ID: Jodi Chavez, female   DOB: 67 y.o.   MRN: [id number]   HPI Patient presents with chronic nail disease 1-5 both feet that are painful and she cannot cut and significant lesion plantar aspect first metatarsal head bilateral with long-term diabetes with neurological symptoms   ROS      Objective:  Physical Exam  Vascular status intact with diminishment of sharp dull vibratory bilateral with elongated nailbeds 1-5 both feet that are thick and incurvated and painful and lesion sub-first metatarsal     Assessment:  Mycotic nail infection with pain 1-5 both feet and lesions with at risk diabetic with neurological manifestations     Plan:  Debridement of painful nailbeds 1-5 both feet with no iatrogenic bleeding and lesions plantar aspect both feet with no iatrogenic bleeding and reappoint for routine care

## 2017-07-19 ENCOUNTER — Other Ambulatory Visit: Payer: Self-pay

## 2017-07-19 ENCOUNTER — Encounter (HOSPITAL_COMMUNITY): Payer: Self-pay | Admitting: Emergency Medicine

## 2017-07-19 ENCOUNTER — Emergency Department (HOSPITAL_COMMUNITY)
Admission: EM | Admit: 2017-07-19 | Discharge: 2017-07-20 | Disposition: A | Discharge status: Home / Self Care | Payer: Medicare Other | Attending: Emergency Medicine | Admitting: Emergency Medicine

## 2017-07-19 DIAGNOSIS — Z7984 Long term (current) use of oral hypoglycemic drugs: Secondary | ICD-10-CM | POA: Diagnosis not present

## 2017-07-19 DIAGNOSIS — Z79899 Other long term (current) drug therapy: Secondary | ICD-10-CM | POA: Diagnosis not present

## 2017-07-19 DIAGNOSIS — I10 Essential (primary) hypertension: Secondary | ICD-10-CM | POA: Insufficient documentation

## 2017-07-19 DIAGNOSIS — R55 Syncope and collapse: Secondary | ICD-10-CM

## 2017-07-19 DIAGNOSIS — E119 Type 2 diabetes mellitus without complications: Secondary | ICD-10-CM | POA: Insufficient documentation

## 2017-07-19 DIAGNOSIS — R110 Nausea: Secondary | ICD-10-CM | POA: Insufficient documentation

## 2017-07-19 NOTE — ED Triage Notes (Signed)
Pt brought in by EMS from [employer] with c/o syncope.  Pt was standing waiting on the store scooter when she got "winded and dizzy"---- pt able to lay down herself on the ground before she passed out for approximately 30 seconds.  Pt denied hitting head; pt denied pain.

## 2017-07-19 NOTE — ED Notes (Signed)
Bed: WA04 Expected date:  Expected time:  Means of arrival:  Comments: 67 yr old syncopal episode

## 2017-07-20 ENCOUNTER — Other Ambulatory Visit: Payer: Self-pay

## 2017-07-20 LAB — BASIC METABOLIC PANEL
Anion gap: 11 (ref 5–15)
BUN: 23 mg/dL — AB (ref 6–20)
CO2: 23 mmol/L (ref 22–32)
CREATININE: 1.42 mg/dL — AB (ref 0.44–1.00)
Calcium: 9 mg/dL (ref 8.9–10.3)
Chloride: 100 mmol/L — ABNORMAL LOW (ref 101–111)
GFR, EST AFRICAN AMERICAN: 44 mL/min — AB (ref >60)
GFR, EST NON AFRICAN AMERICAN: 38 mL/min — AB (ref >60)
Glucose, Bld: 209 mg/dL — ABNORMAL HIGH (ref 65–99)
Potassium: 4.7 mmol/L (ref 3.5–5.1)
SODIUM: 134 mmol/L — AB (ref 135–145)

## 2017-07-20 LAB — URINALYSIS, ROUTINE W REFLEX MICROSCOPIC
BILIRUBIN URINE: NEGATIVE
Glucose, UA: NEGATIVE mg/dL
HGB URINE DIPSTICK: NEGATIVE
Ketones, ur: NEGATIVE mg/dL
NITRITE: NEGATIVE
PH: 5.5 (ref 5.0–8.0)
PROTEIN: NEGATIVE mg/dL
SPECIFIC GRAVITY, URINE: 1.015 (ref 1.005–1.030)

## 2017-07-20 LAB — CBC
HCT: 41.3 % (ref 36.0–46.0)
Hemoglobin: 14.1 g/dL (ref 12.0–15.0)
MCH: 33.3 pg (ref 26.0–34.0)
MCHC: 34.1 g/dL (ref 30.0–36.0)
MCV: 97.6 fL (ref 78.0–100.0)
PLATELETS: 211 10*3/uL (ref 150–400)
RBC: 4.23 MIL/uL (ref 3.87–5.11)
RDW: 12.9 % (ref 11.5–15.5)
WBC: 12.2 10*3/uL — AB (ref 4.0–10.5)

## 2017-07-20 LAB — CBG MONITORING, ED: GLUCOSE-CAPILLARY: 195 mg/dL — AB (ref 65–99)

## 2017-07-20 MED ORDER — ONDANSETRON HCL 4 MG/2ML IJ SOLN
4.0000 mg | Freq: Once | INTRAMUSCULAR | Status: AC
  Administered 2017-07-20: 4 mg via INTRAVENOUS
  Filled 2017-07-20: qty 2

## 2017-07-20 MED ORDER — PROMETHAZINE HCL 25 MG PO TABS: 25.0000 mg | ORAL_TABLET | Freq: Four times a day (QID) | ORAL | 0 refills | Status: DC | PRN

## 2017-07-20 MED ORDER — PROMETHAZINE HCL 25 MG/ML IJ SOLN
12.5000 mg | Freq: Once | INTRAMUSCULAR | Status: AC
  Administered 2017-07-20: 12.5 mg via INTRAVENOUS
  Filled 2017-07-20: qty 1

## 2017-07-20 NOTE — ED Provider Notes (Addendum)
[facility]-EMERGENCY DEPT Provider Note: Lowella Dell, MD, FACEP  CSN: [id number] MRN: [id number] ARRIVAL: 07/19/17 at 07-22-2230 ROOM: WA04/WA04   CHIEF COMPLAINT  Syncope   HISTORY OF PRESENT ILLNESS  07/20/17 2:44 AM Jodi Chavez is a 67 y.o. female who was at [facility] about 9 PM yesterday evening when she began to feel nauseated and hot.  She had been walking around with her walker looking for a motorized scooter.  She subsequently found a motorized scooter but had another episode of nausea.  An employee assisted her to a bench where she laid down.  She reportedly had a syncopal episode and was unresponsive for about 30 seconds.  There was no seizure-like activity.  She did not have any associated vomiting, diarrhea, abdominal pain, chest pain or shortness of breath.  She continues to feel nauseated despite being given "something" by EMS prior to arrival.  She has chronic edema of the lower legs.  She has a history of similar episodes of syncope in the past but none in the last year.   Past Medical History:  Diagnosis Date  . Arthritis    hip  . Constipation    uses laxatives several times a week  . Depression   . Diabetes mellitus    takes Metformin and Amaryl daily  . Dizziness    occasionally and related to meds   . Early cataracts, bilateral   . History of bronchitis    last time several yrs ago  . History of migraine    many yrs ago  . Hyperlipidemia    takes Zetia and Zocor daily  . Hypertension    takes Benazepril nightly and Propranolol tid and Clonidine daily  . Insomnia   . Low back pain   . Peripheral edema   . Peripheral neuropathy   . PONV (postoperative nausea and vomiting)   . Slow urinary stream    occasionally    Past Surgical History:  Procedure Laterality Date  . COLONOSCOPY    . d&c/hysteroscopy/ablation    . DILATION AND CURETTAGE OF UTERUS     couple of times  . ESOPHAGOGASTRODUODENOSCOPY    . 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 POSTERIOR CERVICAL FUSION/FORAMINOTOMY  10/15/2011   Procedure: POSTERIOR CERVICAL FUSION/FORAMINOTOMY LEVEL 2;  Surgeon: Kerrin Champagne, MD;  Location: [facility] OR;  Service: Orthopedics;  Laterality: N/A;  Right C6-7, C7-T1 Foraminotomy with excision HNP C7-T1  . UPPER GASTROINTESTINAL ENDOSCOPY      Family History  Problem Relation Age of Onset  . Cancer Mother   . Stroke Mother   . Diabetes Mother   . Hypertension Mother   . Colon cancer Mother 53       died 07/21/2005  . Gout Father   . Heart disease Father     Social History   Tobacco Use  . Smoking status: Never Smoker  . Smokeless tobacco: Never Used  Substance Use Topics  . Alcohol use: No  . Drug use: No    Prior to Admission medications   Medication Sig Start Date End Date Taking? Authorizing Provider  acetaminophen (TYLENOL) 500 MG tablet Take 500-1,000 mg by mouth 3 (three) times daily. 1000 mg every morning, 500 mg every afternoon, and 1000 mg every night   Yes [provider]  benazepril (LOTENSIN) 40 MG tablet Take 40 mg by mouth every evening.    Yes [provider]  calcium-vitamin D (OSCAL WITH D) 500-200 MG-UNIT per  tablet Take 1 tablet by mouth daily after breakfast.    Yes [provider]  cloNIDine (CATAPRES) 0.2 MG tablet Take 0.2 mg by mouth 2 (two) times daily.    Yes [provider]  diphenhydrAMINE (BENADRYL) 25 MG tablet Take 1 tablet (25 mg total) by mouth every 6 (six) hours as needed for itching. 11/25/14  Yes Rai, Ripudeep K, MD  doxepin (SINEQUAN) 50 MG capsule Take 100 mg by mouth at bedtime.   Yes [provider]  ezetimibe (ZETIA) 10 MG tablet Take 10 mg by mouth daily after breakfast.    Yes [provider]  fluticasone (FLONASE) 50 MCG/ACT nasal spray Place 2 sprays into both nostrils daily.  06/11/14  Yes [provider]  gabapentin (NEURONTIN) 300 MG capsule Take 300 mg by mouth 3 (three) times daily.   Yes  [provider]  Garlic 1000 MG CAPS Take 5,000 mg by mouth daily after breakfast.    Yes [provider]  glimepiride (AMARYL) 2 MG tablet Take 2 mg by mouth daily before breakfast.   Yes [provider]  Glucosamine-Chondroit-Vit C-Mn (GLUCOSAMINE CHONDR 1500 COMPLX PO) Take 1 tablet by mouth daily after breakfast.    Yes [provider]  hydrochlorothiazide (HYDRODIURIL) 25 MG tablet Take 25 mg by mouth daily after breakfast.    Yes [provider]  loratadine (CLARITIN) 10 MG tablet Take 10 mg by mouth daily after breakfast. Reported on 07/12/2015   Yes [provider]  metFORMIN (GLUCOPHAGE) 850 MG tablet Take 850 mg by mouth daily with breakfast.   Yes [provider]  Multiple Vitamin (MULITIVITAMIN WITH MINERALS) TABS Take 1 tablet by mouth daily after breakfast.    Yes [provider]  nabumetone (RELAFEN) 500 MG tablet TAKE ONE TABLET BY MOUTH 2 TO 3 TIMES DAILY AS NEEDED FOR INFLAMMATION 10/11/16  Yes Nadara Mustard, MD  OVER THE COUNTER MEDICATION Apply 1 application topically daily. sween cream applied to groin area daily to prevent rash   Yes [provider]  pantoprazole (PROTONIX) 40 MG tablet Take 1 tablet (40 mg total) by mouth daily. 11/25/14  Yes Rai, Ripudeep K, MD  polyethylene glycol (MIRALAX / GLYCOLAX) packet Take 17 g by mouth daily. Patient taking differently: Take 17 g by mouth daily as needed for moderate constipation.  11/25/14  Yes Rai, Ripudeep K, MD  PROAIR HFA 108 (90 BASE) MCG/ACT inhaler Inhale 2 puffs into the lungs every 4 (four) hours as needed for wheezing or shortness of breath.  06/11/14  Yes [provider]  promethazine (PHENERGAN) 25 MG tablet Take 1 tablet (25 mg total) by mouth every 6 (six) hours as needed for nausea. 11/25/14  Yes Rai, Ripudeep K, MD  propranolol (INDERAL) 20 MG tablet Take 20 mg by mouth 3 (three) times daily.  08/15/16  Yes [provider]    Sennosides 25 MG TABS Take 2 tablets by mouth daily as needed (constipation). Pt takes every other day to every 3 days    Yes [provider]  simvastatin (ZOCOR) 40 MG tablet Take 40 mg by mouth every evening.   Yes [provider]  traMADol (ULTRAM) 50 MG tablet TAKE ONE-HALF TO ONE TABLET BY MOUTH EVERY 4 TO 6 HOURS AS NEEDED FOR PAIN 04/17/16  Yes Kerrin Champagne, MD  DOXEPIN HCL PO Take by mouth.    05/10/11  [provider]  GABAPENTIN, PHN, PO Take by mouth.    05/10/11  [provider]    Allergies Naproxen sodium   REVIEW OF SYSTEMS  Negative except as noted here or in the History of Present Illness.   PHYSICAL EXAMINATION  Initial Vital Signs Blood pressure 137/72, pulse 89, temperature 98.4 F (36.9 C), temperature source Oral, resp. rate 20, height  (1.549 m), weight 102.1 kg (225 lb), SpO2 97 %.  Examination General: Well-developed, well-nourished female in no acute distress; appearance consistent with age of record HENT: normocephalic; atraumatic Eyes: pupils equal, round and reactive to light; extraocular muscles intact Neck: supple Heart: regular rate and rhythm Lungs: clear to auscultation bilaterally Abdomen: soft; nondistended; nontender; bowel sounds present Extremities: No deformity; full range of motion; pulses normal; 2+ pitting edema of lower legs Neurologic: Awake, alert and oriented; motor function intact in all extremities and symmetric; no facial droop Skin: Warm and dry Psychiatric: Normal mood and affect   RESULTS  Summary of this visit's results, reviewed by myself:   EKG Interpretation  Date/Time:  Friday Jul 19 2017 22:40:03 EDT Ventricular Rate:  82 PR Interval:    QRS Duration: 82 QT Interval:  350 QTC Calculation: 409 R Axis:   6 Text Interpretation:  Sinus rhythm Probable left atrial enlargement Low voltage, precordial leads Anteroseptal infarct, old When compared with ECG of 09/07/2016, No  significant change was found Confirmed by Dione Booze ([id number]) on 07/20/2017 12:39:18 AM      Laboratory Studies: Results for orders placed or performed during the hospital encounter of 07/19/17 (from the past 24 hour(s))  Basic metabolic panel     Status: Abnormal   Collection Time: 07/20/17 12:04 AM  Result Value Ref Range   Sodium 134 (L) 135 - 145 mmol/L   Potassium 4.7 3.5 - 5.1 mmol/L   Chloride 100 (L) 101 - 111 mmol/L   CO2 23 22 - 32 mmol/L   Glucose, Bld 209 (H) 65 - 99 mg/dL   BUN 23 (H) 6 - 20 mg/dL   Creatinine, Ser 1.02 (H) 0.44 - 1.00 mg/dL   Calcium 9.0 8.9 - 72.5 mg/dL   GFR calc non Af Amer 38 (L) >60 mL/min   GFR calc Af Amer 44 (L) >60 mL/min   Anion gap 11 5 - 15  CBC     Status: Abnormal   Collection Time: 07/20/17 12:04 AM  Result Value Ref Range   WBC 12.2 (H) 4.0 - 10.5 K/uL   RBC 4.23 3.87 - 5.11 MIL/uL   Hemoglobin 14.1 12.0 - 15.0 g/dL   HCT 36.6 44.0 - 34.7 %   MCV 97.6 78.0 - 100.0 fL   MCH 33.3 26.0 - 34.0 pg   MCHC 34.1 30.0 - 36.0 g/dL   RDW 42.5 95.6 - 38.7 %   Platelets 211 150 - 400 K/uL  CBG monitoring, ED     Status: Abnormal   Collection Time: 07/20/17 12:09 AM  Result Value Ref Range   Glucose-Capillary 195 (H) 65 - 99 mg/dL  Urinalysis, Routine w reflex microscopic     Status: Abnormal   Collection Time: 07/20/17  2:37 AM  Result Value Ref Range   Color, Urine YELLOW (A) YELLOW   APPearance CLEAR (A) CLEAR   Specific Gravity, Urine 1.015 1.005 - 1.030   pH 5.5 5.0 - 8.0   Glucose, UA NEGATIVE NEGATIVE mg/dL   Hgb urine dipstick NEGATIVE NEGATIVE   Bilirubin Urine NEGATIVE NEGATIVE   Ketones, ur NEGATIVE NEGATIVE mg/dL   Protein, ur NEGATIVE NEGATIVE mg/dL   Nitrite  NEGATIVE NEGATIVE   Leukocytes, UA TRACE (A) NEGATIVE   RBC / HPF 0-5 0 - 5 RBC/hpf   WBC, UA 0-5 0 - 5 WBC/hpf   Bacteria, UA RARE (A) NONE SEEN   Squamous Epithelial / LPF 0-5 0 - 5   Mucus PRESENT    Hyaline Casts, UA PRESENT    Imaging Studies: No results  found.  ED COURSE and MDM  Nursing notes and initial vitals signs, including pulse oximetry, reviewed.  Vitals:   07/20/17 0115 07/20/17 0130 07/20/17 0200 07/20/17 0328  BP:  (!) 142/58 137/72 (!) 157/76  Pulse: 81 84 89 85  Resp: (!) Temp:    98.4 F (36.9 C)  TempSrc:    Oral  SpO2: 93% 93% 97% 93%  Weight:      Height:       3:46 AM Patient still complaining of nausea despite Zofran IV.  Will give IV Phenergan.   5:12 AM Patient feeling better but is requesting additional Phenergan and a prescription.  She has had no emesis while in the ED.  Her vital signs have been stable.  Patient's syncopal episode is consistent with vasovagal syncope resulting from her nausea.  The cause her nausea is unclear.  She has relatively normal laboratory work and EKG.  PROCEDURES    ED DIAGNOSES     ICD-10-CM   1. Nausea R11.0   2. Vasovagal syncope R55        Crist Kruszka, Jonny Ruiz, MD 07/20/17 1610    Paula Libra, MD 07/20/17 [phone]

## 2017-07-23 ENCOUNTER — Other Ambulatory Visit: Payer: Medicare Other

## 2017-07-26 ENCOUNTER — Other Ambulatory Visit: Payer: Medicare Other

## 2017-08-28 ENCOUNTER — Ambulatory Visit: Payer: Medicare Other | Admitting: Podiatry

## 2017-09-04 ENCOUNTER — Emergency Department: Payer: Medicare Other

## 2017-09-04 ENCOUNTER — Emergency Department
Admission: EM | Admit: 2017-09-04 | Discharge: 2017-09-04 | Disposition: A | Discharge status: Home / Self Care | Payer: Medicare Other | Attending: Emergency Medicine | Admitting: Emergency Medicine

## 2017-09-04 DIAGNOSIS — W19XXXA Unspecified fall, initial encounter: Secondary | ICD-10-CM

## 2017-09-04 DIAGNOSIS — I10 Essential (primary) hypertension: Secondary | ICD-10-CM | POA: Diagnosis not present

## 2017-09-04 DIAGNOSIS — R51 Headache: Secondary | ICD-10-CM | POA: Diagnosis not present

## 2017-09-04 DIAGNOSIS — E119 Type 2 diabetes mellitus without complications: Secondary | ICD-10-CM | POA: Insufficient documentation

## 2017-09-04 DIAGNOSIS — M25562 Pain in left knee: Secondary | ICD-10-CM | POA: Insufficient documentation

## 2017-09-04 DIAGNOSIS — W010XXA Fall on same level from slipping, tripping and stumbling without subsequent striking against object, initial encounter: Secondary | ICD-10-CM | POA: Diagnosis not present

## 2017-09-04 DIAGNOSIS — F329 Major depressive disorder, single episode, unspecified: Secondary | ICD-10-CM | POA: Insufficient documentation

## 2017-09-04 DIAGNOSIS — Z79899 Other long term (current) drug therapy: Secondary | ICD-10-CM | POA: Insufficient documentation

## 2017-09-04 DIAGNOSIS — M25511 Pain in right shoulder: Secondary | ICD-10-CM | POA: Diagnosis present

## 2017-09-04 DIAGNOSIS — M25552 Pain in left hip: Secondary | ICD-10-CM | POA: Insufficient documentation

## 2017-09-04 DIAGNOSIS — Z7984 Long term (current) use of oral hypoglycemic drugs: Secondary | ICD-10-CM | POA: Insufficient documentation

## 2017-09-04 MED ORDER — ACETAMINOPHEN 325 MG PO TABS
650.0000 mg | ORAL_TABLET | Freq: Once | ORAL | Status: AC
  Administered 2017-09-04: 650 mg via ORAL
  Filled 2017-09-04: qty 2

## 2017-09-04 NOTE — ED Notes (Signed)
Patient presents to the ED via EMS post fall.  Patient is complaining of left shoulder, left hip and left knee pain.  Patient reports hitting her head, denies loss of consciousness.  Patient is hypertensive and states she has not yet taken her blood pressure medication.  Patient does not take blood thinners.  Per EMS patient's blood sugar was 221.

## 2017-09-04 NOTE — ED Provider Notes (Signed)
[facility] Emergency Department Provider Note   ____________________________________________   First MD Initiated Contact with Patient 09/04/17 1913     (approximate)  I have reviewed the triage vital signs and the nursing notes.   HISTORY  Chief Complaint Fall   HPI Jodi Chavez is a 67 y.o. female patient was walking with a walker think she lost her balance she fell she did not pass out did not hit her head she has pain in the right shoulder left hip left knee.  She is feeling better now she does have somewhat of a headache.  She has seen orthopedics 2 years ago to consult about a hip replacement but was somehow lost to follow-up.    Past Medical History:  Diagnosis Date  . Arthritis    hip  . Constipation    uses laxatives several times a week  . Depression   . Diabetes mellitus    takes Metformin and Amaryl daily  . Dizziness    occasionally and related to meds   . Early cataracts, bilateral   . History of bronchitis    last time several yrs ago  . History of migraine    many yrs ago  . Hyperlipidemia    takes Zetia and Zocor daily  . Hypertension    takes Benazepril nightly and Propranolol tid and Clonidine daily  . Insomnia   . Low back pain   . Peripheral edema   . Peripheral neuropathy   . PONV (postoperative nausea and vomiting)   . Slow urinary stream    occasionally    Patient Active Problem List   Diagnosis Date Noted  . HLD (hyperlipidemia) 11/21/2014  . Depression 11/21/2014  . Nausea & vomiting 11/21/2014  . Sepsis (HCC) 11/21/2014  . Coughing 11/21/2014  . UTI (lower urinary tract infection) 11/21/2014  . AKI (acute kidney injury) (HCC) 11/21/2014  . Nausea with vomiting   . 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 HNP (herniated nucleus pulposus), cervical 10/15/2011    Class: Chronic  . Diabetes mellitus without complication (HCC) 09/30/2006  . Essential hypertension  09/30/2006  . DILATION AND CURETTAGE, HX OF 09/30/2006    Past Surgical History:  Procedure Laterality Date  . COLONOSCOPY    . d&c/hysteroscopy/ablation    . DILATION AND CURETTAGE OF UTERUS     couple of times  . ESOPHAGOGASTRODUODENOSCOPY    . 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 POSTERIOR CERVICAL FUSION/FORAMINOTOMY  10/15/2011   Procedure: POSTERIOR CERVICAL FUSION/FORAMINOTOMY LEVEL 2;  Surgeon: Kerrin Champagne, MD;  Location: [facility] OR;  Service: Orthopedics;  Laterality: N/A;  Right C6-7, C7-T1 Foraminotomy with excision HNP C7-T1  . UPPER GASTROINTESTINAL ENDOSCOPY      Prior to Admission medications   Medication Sig Start Date End Date Taking? Authorizing Provider  acetaminophen (TYLENOL) 500 MG tablet Take 500-1,000 mg by mouth 3 (three) times daily. 1000 mg every morning, 500 mg every afternoon, and 1000 mg every night    [provider]  benazepril (LOTENSIN) 40 MG tablet Take 40 mg by mouth every evening.     [provider]  calcium-vitamin D (OSCAL WITH D) 500-200 MG-UNIT per tablet Take 1 tablet by mouth daily after breakfast.     [provider]  cloNIDine (CATAPRES) 0.2 MG tablet Take 0.2 mg by mouth 2 (two) times daily.     [provider]  diphenhydrAMINE (BENADRYL) 25 MG  tablet Take 1 tablet (25 mg total) by mouth every 6 (six) hours as needed for itching. 11/25/14   Rai, Ripudeep K, MD  doxepin (SINEQUAN) 50 MG capsule Take 100 mg by mouth at bedtime.    [provider]  ezetimibe (ZETIA) 10 MG tablet Take 10 mg by mouth daily after breakfast.     [provider]  fluticasone (FLONASE) 50 MCG/ACT nasal spray Place 2 sprays into both nostrils daily.  06/11/14   [provider]  gabapentin (NEURONTIN) 300 MG capsule Take 300 mg by mouth 3 (three) times daily.    [provider]  Garlic 1000 MG CAPS Take 5,000 mg by mouth daily after breakfast.     [provider]    glimepiride (AMARYL) 2 MG tablet Take 2 mg by mouth daily before breakfast.    [provider]  Glucosamine-Chondroit-Vit C-Mn (GLUCOSAMINE CHONDR 1500 COMPLX PO) Take 1 tablet by mouth daily after breakfast.     [provider]  hydrochlorothiazide (HYDRODIURIL) 25 MG tablet Take 25 mg by mouth daily after breakfast.     [provider]  loratadine (CLARITIN) 10 MG tablet Take 10 mg by mouth daily after breakfast. Reported on 07/12/2015    [provider]  metFORMIN (GLUCOPHAGE) 850 MG tablet Take 850 mg by mouth daily with breakfast.    [provider]  Multiple Vitamin (MULITIVITAMIN WITH MINERALS) TABS Take 1 tablet by mouth daily after breakfast.     [provider]  nabumetone (RELAFEN) 500 MG tablet TAKE ONE TABLET BY MOUTH 2 TO 3 TIMES DAILY AS NEEDED FOR INFLAMMATION 10/11/16   Nadara Mustard, MD  OVER THE COUNTER MEDICATION Apply 1 application topically daily. sween cream applied to groin area daily to prevent rash    [provider]  pantoprazole (PROTONIX) 40 MG tablet Take 1 tablet (40 mg total) by mouth daily. 11/25/14   Rai, Delene Ruffini, MD  polyethylene glycol (MIRALAX / GLYCOLAX) packet Take 17 g by mouth daily. Patient taking differently: Take 17 g by mouth daily as needed for moderate constipation.  11/25/14   Rai, Ripudeep Kirtland Bouchard, MD  PROAIR HFA 108 (90 BASE) MCG/ACT inhaler Inhale 2 puffs into the lungs every 4 (four) hours as needed for wheezing or shortness of breath.  06/11/14   [provider]  promethazine (PHENERGAN) 25 MG tablet Take 1 tablet (25 mg total) by mouth every 6 (six) hours as needed for nausea. 07/20/17   Molpus, John, MD  propranolol (INDERAL) 20 MG tablet Take 20 mg by mouth 3 (three) times daily.  08/15/16   [provider]  Sennosides 25 MG TABS Take 2 tablets by mouth daily as needed (constipation). Pt takes every other day to every 3 days     [provider]  simvastatin (ZOCOR)  40 MG tablet Take 40 mg by mouth every evening.    [provider]  traMADol (ULTRAM) 50 MG tablet TAKE ONE-HALF TO ONE TABLET BY MOUTH EVERY 4 TO 6 HOURS AS NEEDED FOR PAIN 04/17/16   Kerrin Champagne, MD  DOXEPIN HCL PO Take by mouth.    05/10/11  [provider]  GABAPENTIN, PHN, PO Take by mouth.    05/10/11  [provider]    Allergies Naproxen sodium  Family History  Problem Relation Age of Onset  . Cancer Mother   . Stroke Mother   . Diabetes Mother   . Hypertension Mother   . Colon cancer Mother 72  died 2007  . Gout Father   . Heart disease Father     Social History Social History   Tobacco Use  . Smoking status: Never Smoker  . Smokeless tobacco: Never Used  Substance Use Topics  . Alcohol use: No  . Drug use: No    Review of Systems  Constitutional: No fever/chills Eyes: No visual changes. ENT: No sore throat. Cardiovascular: Denies chest pain. Respiratory: Denies shortness of breath. Gastrointestinal: No abdominal pain.  No nausea, no vomiting.  No diarrhea.  No constipation. Genitourinary: Negative for dysuria. Musculoskeletal: Negative for new back pain. Skin: Negative for rash. Neurological: Negative for headaches, focal weakness   ____________________________________________   PHYSICAL EXAM:  VITAL SIGNS: ED Triage Vitals  Enc Vitals Group     BP 09/04/17 1816 (!) 156/71     Pulse Rate 09/04/17 1816 92     Resp 09/04/17 1816 20     Temp 09/04/17 1816 97.8 F (36.6 C)     Temp Source 09/04/17 1816 Oral     SpO2 09/04/17 1816 95 %     Weight 09/04/17 1817 225 lb (102.1 kg)     Height 09/04/17 1817 5\' 1"  (1.549 m)     Head Circumference --      Peak Flow --      Pain Score 09/04/17 1820 8     Pain Loc --      Pain Edu? --      Excl. in GC? --     Constitutional: Alert and oriented. Well appearing and in no acute distress. Eyes: Conjunctivae are normal.  Head: Atraumatic. Nose: No  congestion/rhinnorhea. Mouth/Throat: Mucous membranes are moist.  Oropharynx non-erythematous. Neck: No stridor.  No cervical spine tenderness to palpation. Cardiovascular: Normal rate, regular rhythm. Grossly normal heart sounds.  Good peripheral circulation. Respiratory: Normal respiratory effort.  No retractions. Lungs CTAB. Gastrointestinal: Soft and nontender. No distention. No abdominal bruits. No CVA tenderness. Musculoskeletal: Some left hip pain to palpation trace edema bilaterally Neurologic:  Normal speech and language. No gross focal neurologic deficits are appreciated Skin:  Skin is warm, dry and intact. No rash noted. Psychiatric: Mood and affect are normal. Speech and behavior are normal.  ____________________________________________   LABS (all labs ordered are listed, but only abnormal results are displayed)  Labs Reviewed - No data to display ____________________________________________  EKG   ____________________________________________  RADIOLOGY  ED MD interpretation: X-rays of the knee hip shoulder chest show no acute pathology there is severe DJD in the hip I reviewed the films CT of the head is also negative per radiology Official radiology report(s): Dg Chest 1 View  Result Date: 09/04/2017 CLINICAL DATA:  Initial evaluation for acute trauma, fall. EXAM: CHEST  1 VIEW COMPARISON:  Prior radiograph from 11/21/2014. FINDINGS: The cardiac and mediastinal silhouettes are stable in size and contour, and remain within normal limits. The lungs are mildly hypoinflated. No airspace consolidation, pleural effusion, or pulmonary edema is identified. There is no pneumothorax. No acute osseous abnormality identified. IMPRESSION: No active disease. Electronically Signed   By: Rise MuBenjamin  McClintock M.D.   On: 09/04/2017 19:40   Dg Shoulder Right  Result Date: 09/04/2017 CLINICAL DATA:  Initial evaluation for acute trauma, fall. EXAM: RIGHT SHOULDER - 2+ VIEW COMPARISON:   None. FINDINGS: No acute fracture dislocation. Osteoarthritic changes about the glenohumeral and acromioclavicular joints. No acute soft tissue abnormality. Visualize right hemithorax clear. IMPRESSION: No acute osseous abnormality about the right shoulder. Electronically Signed   By: Sharlet SalinaBenjamin  Phill Myron M.D.   On: 09/04/2017 19:36   Ct Head Wo Contrast  Result Date: 09/04/2017 CLINICAL DATA:  Initial evaluation for acute trauma, fall. EXAM: CT HEAD WITHOUT CONTRAST TECHNIQUE: Contiguous axial images were obtained from the base of the skull through the vertex without intravenous contrast. COMPARISON:  None. FINDINGS: Brain: Cerebral volume within normal limits for patient age. No evidence for acute intracranial hemorrhage. No findings to suggest acute large vessel territory infarct. No mass lesion, midline shift, or mass effect. Ventricles are normal in size without evidence for hydrocephalus. No extra-axial fluid collection identified. Vascular: No hyperdense vessel identified. Skull: Scalp soft tissues demonstrate no acute abnormality. Calvarium intact. Sinuses/Orbits: Globes and orbital soft tissues within normal limits. Visualized paranasal sinuses are clear. No mastoid effusion. IMPRESSION: Negative head CT.  No acute intracranial abnormality identified. Electronically Signed   By: Rise Mu M.D.   On: 09/04/2017 19:48   Dg Knee Complete 4 Views Left  Result Date: 09/04/2017 CLINICAL DATA:  Initial evaluation for acute trauma, fall. EXAM: LEFT KNEE - COMPLETE 4+ VIEW COMPARISON:  None. FINDINGS: No acute fracture or dislocation. No joint effusion. Moderate degenerative osteoarthrosis at the patellofemoral articulation. Diffuse osteopenia. Vascular calcifications about the knee. No soft tissue abnormality. IMPRESSION: No acute osseous abnormality about the left knee. Electronically Signed   By: Rise Mu M.D.   On: 09/04/2017 19:35   Dg Hip Unilat With Pelvis 2-3 Views  Left  Result Date: 09/04/2017 CLINICAL DATA:  Initial evaluation for acute trauma, fall. EXAM: DG HIP (WITH OR WITHOUT PELVIS) 2-3V LEFT COMPARISON:  None. FINDINGS: No acute fracture or dislocation. Severe osteoarthritic changes with flattening of the left femoral head. Visualized bony pelvis intact. No acute soft tissue abnormality. Vascular calcifications within the proximal thigh. IMPRESSION: 1. No acute osseous abnormality about the left hip. 2. Severe osteoarthritic changes with flattening of the left femoral head. Electronically Signed   By: Rise Mu M.D.   On: 09/04/2017 19:38    ____________________________________________   PROCEDURES  Procedure(s) performed:   Procedures  Critical Care performed:  ____________________________________________   INITIAL IMPRESSION / ASSESSMENT AND PLAN / ED COURSE  I will discharge the patient.  I discussed with her using Tylenol for pain.  She will try to follow-up with orthopedics.  I did put that in the discharge summary as well.     ____________________________________________   FINAL CLINICAL IMPRESSION(S) / ED DIAGNOSES  Final diagnoses:  Fall, initial encounter     ED Discharge Orders    None       Note:  This document was prepared using Dragon voice recognition software and may include unintentional dictation errors.    Arnaldo Natal, MD 09/04/17 2051

## 2017-09-04 NOTE — ED Triage Notes (Signed)
Pt here from sister's house after a fall, reports mechanical. Reports hitting left hip, left knee and back of head on wooden floor. Also reports pain to right shoulder. Denies LOC.

## 2017-09-04 NOTE — ED Notes (Signed)
Pt able to ambulate using walker to bedside commode. Pt states soreness but says she can go home. MD notified.

## 2017-09-04 NOTE — Discharge Instructions (Addendum)
These return for any further problems.  Be careful with your walker.  I would recommend you follow-up with orthopedics to see if they can get you a hip replacement for your left hip where the arthritis is very bad.

## 2017-10-03 ENCOUNTER — Other Ambulatory Visit: Payer: Self-pay | Admitting: Internal Medicine

## 2017-10-03 DIAGNOSIS — E2839 Other primary ovarian failure: Secondary | ICD-10-CM

## 2017-10-05 ENCOUNTER — Other Ambulatory Visit (INDEPENDENT_AMBULATORY_CARE_PROVIDER_SITE_OTHER): Payer: Self-pay | Admitting: Orthopedic Surgery

## 2017-10-07 ENCOUNTER — Emergency Department
Admission: EM | Admit: 2017-10-07 | Discharge: 2017-10-07 | Disposition: A | Discharge status: Home / Self Care | Payer: Medicare Other | Attending: Emergency Medicine | Admitting: Emergency Medicine

## 2017-10-07 ENCOUNTER — Encounter: Payer: Self-pay | Admitting: Emergency Medicine

## 2017-10-07 DIAGNOSIS — S80821A Blister (nonthermal), right lower leg, initial encounter: Secondary | ICD-10-CM | POA: Insufficient documentation

## 2017-10-07 DIAGNOSIS — Y998 Other external cause status: Secondary | ICD-10-CM | POA: Insufficient documentation

## 2017-10-07 DIAGNOSIS — E119 Type 2 diabetes mellitus without complications: Secondary | ICD-10-CM | POA: Insufficient documentation

## 2017-10-07 DIAGNOSIS — Z79899 Other long term (current) drug therapy: Secondary | ICD-10-CM | POA: Insufficient documentation

## 2017-10-07 DIAGNOSIS — Z5189 Encounter for other specified aftercare: Secondary | ICD-10-CM

## 2017-10-07 DIAGNOSIS — I10 Essential (primary) hypertension: Secondary | ICD-10-CM | POA: Insufficient documentation

## 2017-10-07 DIAGNOSIS — Y939 Activity, unspecified: Secondary | ICD-10-CM | POA: Insufficient documentation

## 2017-10-07 DIAGNOSIS — Y33XXXA Other specified events, undetermined intent, initial encounter: Secondary | ICD-10-CM | POA: Diagnosis not present

## 2017-10-07 DIAGNOSIS — Z7984 Long term (current) use of oral hypoglycemic drugs: Secondary | ICD-10-CM | POA: Insufficient documentation

## 2017-10-07 DIAGNOSIS — E785 Hyperlipidemia, unspecified: Secondary | ICD-10-CM | POA: Diagnosis not present

## 2017-10-07 DIAGNOSIS — Y929 Unspecified place or not applicable: Secondary | ICD-10-CM | POA: Insufficient documentation

## 2017-10-07 LAB — GLUCOSE, CAPILLARY: Glucose-Capillary: 170 mg/dL — ABNORMAL HIGH (ref 70–99)

## 2017-10-07 MED ORDER — CEPHALEXIN 250 MG PO CAPS: 250.0000 mg | ORAL_CAPSULE | Freq: Three times a day (TID) | ORAL | 0 refills | Status: AC

## 2017-10-07 NOTE — ED Triage Notes (Signed)
Pt arrives from home with concerns over sores on right and left leg. Pt states "I am diabetic so I just wanted to have them checked." Pt has wound of right & left leg. Pt states area started on Thursday and progressively worsened. Pt denies injury. Pt states she put a sock over area and it made it worse. Pt denies any fever, nausea, or diarrhea.

## 2017-10-07 NOTE — ED Notes (Signed)
Pt to room 32 via w/c with no distress noted; pt reports since Thurs had noted large blister to right lower leg and scabbed healing area to left lower leg; denies any known injury; st hx diabetes

## 2017-10-07 NOTE — ED Notes (Signed)
ED Provider at bedside. 

## 2017-10-07 NOTE — ED Provider Notes (Signed)
[facility] Emergency Department Provider Note  Time seen: 8:03 PM  I have reviewed the triage vital signs and the nursing notes.   HISTORY  Chief Complaint Wound Check    HPI Jodi Chavez is a 66 y.o. female with a past medical history of diabetes, hyperlipidemia, hypertension, presents to the emergency department for evaluation of a blister to her right leg.  According to the patient for the past 1 week she has noticed 2 small blisters one on the right lower externally one on the left lower extremity.  States the left lower extremely blister has scabbed over and is resolving but the right lower extreme a blister has gotten somewhat bigger and is now leaking fluid.  Patient was concerned that this could be infectious so she came to the emergency department.  She denies any fever.  States her blood glucoses have been in the 100s and 200s which is normal for her.  Denies any nausea or vomiting.  Largely negative review of systems otherwise.   Past Medical History:  Diagnosis Date  . Arthritis    hip  . Constipation    uses laxatives several times a week  . Depression   . Diabetes mellitus    takes Metformin and Amaryl daily  . Dizziness    occasionally and related to meds   . Early cataracts, bilateral   . History of bronchitis    last time several yrs ago  . History of migraine    many yrs ago  . Hyperlipidemia    takes Zetia and Zocor daily  . Hypertension    takes Benazepril nightly and Propranolol tid and Clonidine daily  . Insomnia   . Low back pain   . Peripheral edema   . Peripheral neuropathy   . PONV (postoperative nausea and vomiting)   . Slow urinary stream    occasionally    Patient Active Problem List   Diagnosis Date Noted  . HLD (hyperlipidemia) 11/21/2014  . Depression 11/21/2014  . Nausea & vomiting 11/21/2014  . Sepsis (HCC) 11/21/2014  . Coughing 11/21/2014  . UTI (lower urinary tract infection) 11/21/2014  . AKI (acute  kidney injury) (HCC) 11/21/2014  . Nausea with vomiting   . 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 HNP (herniated nucleus pulposus), cervical 10/15/2011    Class: Chronic  . Diabetes mellitus without complication (HCC) 09/30/2006  . Essential hypertension 09/30/2006  . DILATION AND CURETTAGE, HX OF 09/30/2006    Past Surgical History:  Procedure Laterality Date  . COLONOSCOPY    . d&c/hysteroscopy/ablation    . DILATION AND CURETTAGE OF UTERUS     couple of times  . ESOPHAGOGASTRODUODENOSCOPY    . 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 POSTERIOR CERVICAL FUSION/FORAMINOTOMY  10/15/2011   Procedure: POSTERIOR CERVICAL FUSION/FORAMINOTOMY LEVEL 2;  Surgeon: Kerrin Champagne, MD;  Location: [facility] OR;  Service: Orthopedics;  Laterality: N/A;  Right C6-7, C7-T1 Foraminotomy with excision HNP C7-T1  . UPPER GASTROINTESTINAL ENDOSCOPY      Prior to Admission medications   Medication Sig Start Date End Date Taking? Authorizing Provider  acetaminophen (TYLENOL) 500 MG tablet Take 500-1,000 mg by mouth 3 (three) times daily. 1000 mg every morning, 500 mg every afternoon, and 1000 mg every night    [provider]  benazepril (LOTENSIN) 40 MG tablet Take 40 mg by mouth every evening.     [provider]  calcium-vitamin D (OSCAL WITH D) 500-200 MG-UNIT per tablet Take 1 tablet by mouth daily after breakfast.     [provider]  cloNIDine (CATAPRES) 0.2 MG tablet Take 0.2 mg by mouth 2 (two) times daily.     [provider]  diphenhydrAMINE (BENADRYL) 25 MG tablet Take 1 tablet (25 mg total) by mouth every 6 (six) hours as needed for itching. 11/25/14   Rai, Ripudeep K, MD  doxepin (SINEQUAN) 50 MG capsule Take 100 mg by mouth at bedtime.    [provider]  ezetimibe (ZETIA) 10 MG tablet Take 10 mg by mouth daily after breakfast.     [provider]  fluticasone (FLONASE) 50 MCG/ACT nasal  spray Place 2 sprays into both nostrils daily.  06/11/14   [provider]  gabapentin (NEURONTIN) 300 MG capsule Take 300 mg by mouth 3 (three) times daily.    [provider]  Garlic 1000 MG CAPS Take 5,000 mg by mouth daily after breakfast.     [provider]  glimepiride (AMARYL) 2 MG tablet Take 2 mg by mouth daily before breakfast.    [provider]  Glucosamine-Chondroit-Vit C-Mn (GLUCOSAMINE CHONDR 1500 COMPLX PO) Take 1 tablet by mouth daily after breakfast.     [provider]  hydrochlorothiazide (HYDRODIURIL) 25 MG tablet Take 25 mg by mouth daily after breakfast.     [provider]  loratadine (CLARITIN) 10 MG tablet Take 10 mg by mouth daily after breakfast. Reported on 07/12/2015    [provider]  metFORMIN (GLUCOPHAGE) 850 MG tablet Take 850 mg by mouth daily with breakfast.    [provider]  Multiple Vitamin (MULITIVITAMIN WITH MINERALS) TABS Take 1 tablet by mouth daily after breakfast.     [provider]  nabumetone (RELAFEN) 500 MG tablet TAKE ONE TABLET BY MOUTH 2 TO 3 TIMES DAILY AS NEEDED FOR INFLAMMATION 10/11/16   Nadara Mustard, MD  OVER THE COUNTER MEDICATION Apply 1 application topically daily. sween cream applied to groin area daily to prevent rash    [provider]  pantoprazole (PROTONIX) 40 MG tablet Take 1 tablet (40 mg total) by mouth daily. 11/25/14   Rai, Delene Ruffini, MD  polyethylene glycol (MIRALAX / GLYCOLAX) packet Take 17 g by mouth daily. Patient taking differently: Take 17 g by mouth daily as needed for moderate constipation.  11/25/14   Rai, Ripudeep Kirtland Bouchard, MD  PROAIR HFA 108 (90 BASE) MCG/ACT inhaler Inhale 2 puffs into the lungs every 4 (four) hours as needed for wheezing or shortness of breath.  06/11/14   [provider]  promethazine (PHENERGAN) 25 MG tablet Take 1 tablet (25 mg total) by mouth every 6 (six) hours as needed for nausea. 07/20/17   Molpus,  John, MD  propranolol (INDERAL) 20 MG tablet Take 20 mg by mouth 3 (three) times daily.  08/15/16   [provider]  Sennosides 25 MG TABS Take 2 tablets by mouth daily as needed (constipation). Pt takes every other day to every 3 days     [provider]  simvastatin (ZOCOR) 40 MG tablet Take 40 mg by mouth every evening.    [provider]  traMADol (ULTRAM) 50 MG tablet TAKE ONE-HALF TO ONE TABLET BY MOUTH EVERY 4 TO 6 HOURS AS NEEDED FOR PAIN 04/17/16   Kerrin Champagne, MD  DOXEPIN HCL PO Take by mouth.    05/10/11  [provider]  GABAPENTIN, PHN, PO Take by  mouth.    05/10/11  [provider]    Allergies  Allergen Reactions  . Naproxen Sodium Rash    anaprox    Family History  Problem Relation Age of Onset  . Cancer Mother   . Stroke Mother   . Diabetes Mother   . Hypertension Mother   . Colon cancer Mother [id number]       died 2007  . Gout Father   . Heart disease Father     Social History Social History   Tobacco Use  . Smoking status: Never Smoker  . Smokeless tobacco: Never Used  Substance Use Topics  . Alcohol use: No  . Drug use: No    Review of Systems Constitutional: Negative for fever. Cardiovascular: Negative for chest pain. Respiratory: Negative for shortness of breath. Gastrointestinal: Negative for abdominal pain, vomiting Musculoskeletal: Blisters to bilateral lower extremities Skin: 2 blisters on bilateral lower extremities Neurological: Negative for headache All other ROS negative  ____________________________________________   PHYSICAL EXAM:  VITAL SIGNS: ED Triage Vitals  Enc Vitals Group     BP 10/07/17 1927 (!) 170/80     Pulse Rate 10/07/17 1927 96     Resp 10/07/17 1927 20     Temp 10/07/17 1927 98.1 F (36.7 C)     Temp Source 10/07/17 1927 Oral     SpO2 10/07/17 1927 96 %     Weight 10/07/17 1925 225 lb (102.1 kg)     Height 10/07/17 1925 5\' 1"  (1.549 m)     Head Circumference --       Peak Flow --      Pain Score --      Pain Loc --      Pain Edu? --      Excl. in GC? --     Constitutional: Alert and oriented. Well appearing and in no distress. Eyes: Normal exam ENT   Head: Normocephalic and atraumatic.   Mouth/Throat: Mucous membranes are moist. Cardiovascular: Normal rate, regular rhythm. Respiratory: Normal respiratory effort without tachypnea nor retractions. Breath sounds are clear  Gastrointestinal: Soft and nontender. No distention.  Musculoskeletal: Patient has a scabbed over area in the left lower extremity approximately 2 cm in diameter, well-appearing does not appear to be superinfected.  On the right lower extremity patient has a somewhat larger blister approximately 4 cm in diameter filled with clear fluid somewhat of which is mildly leaking.  No surrounding erythema.  No significant lower extremity edema, no surrounding tenderness. Neurologic:  Normal speech and language. No gross focal neurologic deficits  Skin:  Skin is warm.  Listers as described above Psychiatric: Mood and affect are normal.   ____________________________________________   INITIAL IMPRESSION / ASSESSMENT AND PLAN / ED COURSE  Pertinent labs & imaging results that were available during my care of the patient were reviewed by me and considered in my medical decision making (see chart for details).  Patient presents emergency department for 2 lower extremity blisters.  The one on the left leg appears to be resolving proximate 2 cm diameter area that is now scabbed over, no signs of superinfection.  However the right lower extremity she does have a moderate sized blister approximately 4 cm in diameter filled with clear fluid.  Given the patient's history of diabetes we will cover with antibiotics as a precaution.  Patient's fingerstick blood glucose in the emergency department appears well.  Patient called her primary care doctor today prior to coming to the emergency department and  states she will follow-up with him this week.  I believe this is a reasonable plan of care.  I also discussed return precautions for any worsening of the wound or development of fever.  ____________________________________________   FINAL CLINICAL IMPRESSION(S) / ED DIAGNOSES  Lower extremity blister    Minna AntisPaduchowski, Shatasia Cutshaw, MD 10/07/17 2008

## 2017-10-07 NOTE — Discharge Instructions (Addendum)
As we discussed please keep the blister area covered.  Please take antibiotic as prescribed and follow-up with your doctor in 2 to 3 days for recheck.  Return to the emergency department for any worsening of the wound or development of fever, or any other symptom personally concerning to yourself.

## 2017-10-16 ENCOUNTER — Encounter: Payer: Self-pay | Admitting: Podiatry

## 2017-10-16 ENCOUNTER — Ambulatory Visit (INDEPENDENT_AMBULATORY_CARE_PROVIDER_SITE_OTHER): Payer: Medicare Other | Admitting: Podiatry

## 2017-10-16 DIAGNOSIS — E1149 Type 2 diabetes mellitus with other diabetic neurological complication: Secondary | ICD-10-CM | POA: Diagnosis not present

## 2017-10-16 DIAGNOSIS — M79674 Pain in right toe(s): Secondary | ICD-10-CM

## 2017-10-16 DIAGNOSIS — B351 Tinea unguium: Secondary | ICD-10-CM | POA: Diagnosis not present

## 2017-10-16 DIAGNOSIS — M79675 Pain in left toe(s): Secondary | ICD-10-CM

## 2017-10-16 DIAGNOSIS — E1140 Type 2 diabetes mellitus with diabetic neuropathy, unspecified: Secondary | ICD-10-CM | POA: Diagnosis not present

## 2017-10-16 NOTE — Progress Notes (Signed)
This patient presents the office with chief complaint of long thick painful nails.  Patient has not been seen in over 6 months.  She previously had nail surgery for the removal of the first and second left and the first right toenail previously.  This patient is diabetic with nuropathy.  She presents the office today for an evaluation and treatment.  General Appearance  Alert, conversant and in no acute stress.  Vascular  Dorsalis pedis and posterior tibial  pulses are palpable  bilaterally.  Capillary return is within normal limits  bilaterally. Temperature is within normal limits  bilaterally.  Neurologic  Senn-Weinstein monofilament wire diminished   bilaterally. Muscle power within normal limits bilaterally.  Nails Thick disfigured discolored nails with subungual debris  from 3-5 nails left foot and 2-5 right foot.  Her third and fourth toenails on the right foot has self  avulsed No evidence of bacterial infection or drainage bilaterally.  Orthopedic  No limitations of motion of motion feet .  No crepitus or effusions noted.  No bony pathology or digital deformities noted.  Skin  normotropic skin with no porokeratosis noted bilaterally.  No signs of infections or ulcers noted.    Onychomycosis  B/L    Debridement of onychomycotic nails  X 7  B/L. Her third and fourth digits, right foot have healed with no drainage or infection.  Patient was told these self avulsed nail plates will regrow.RTC 3 months   Helane GuntherGregory Jamesetta Greenhalgh DPM

## 2017-10-30 ENCOUNTER — Other Ambulatory Visit (INDEPENDENT_AMBULATORY_CARE_PROVIDER_SITE_OTHER): Payer: Self-pay | Admitting: Orthopedic Surgery

## 2017-10-31 NOTE — Telephone Encounter (Signed)
This is CB pt from 04/2015 has not been in office since.

## 2018-01-15 ENCOUNTER — Ambulatory Visit: Payer: Medicare Other | Admitting: Podiatry

## 2018-03-19 ENCOUNTER — Ambulatory Visit: Payer: Medicare Other | Admitting: Podiatry

## 2018-04-09 ENCOUNTER — Ambulatory Visit: Payer: Medicare Other | Admitting: Podiatry

## 2018-04-30 ENCOUNTER — Encounter: Payer: Self-pay | Admitting: Podiatry

## 2018-04-30 ENCOUNTER — Ambulatory Visit (INDEPENDENT_AMBULATORY_CARE_PROVIDER_SITE_OTHER): Payer: Medicare Other | Admitting: Podiatry

## 2018-04-30 DIAGNOSIS — B351 Tinea unguium: Secondary | ICD-10-CM | POA: Diagnosis not present

## 2018-04-30 DIAGNOSIS — M79675 Pain in left toe(s): Secondary | ICD-10-CM

## 2018-04-30 DIAGNOSIS — E1149 Type 2 diabetes mellitus with other diabetic neurological complication: Secondary | ICD-10-CM

## 2018-04-30 DIAGNOSIS — E1140 Type 2 diabetes mellitus with diabetic neuropathy, unspecified: Secondary | ICD-10-CM | POA: Diagnosis not present

## 2018-04-30 DIAGNOSIS — M79674 Pain in right toe(s): Secondary | ICD-10-CM | POA: Diagnosis not present

## 2018-04-30 DIAGNOSIS — L84 Corns and callosities: Secondary | ICD-10-CM | POA: Diagnosis not present

## 2018-04-30 NOTE — Progress Notes (Signed)
This patient presents the office with chief complaint of long thick painful nails.  Patient has not been seen in over 6 months.  She previously had had nail surgery for the removal of the first and second toenails of the left foot and the right big toenail first.  She presents the office with a painful callus under her big toe joint on her right foot.  She presents the office today for an evaluation and treatment of these 2 conditions.  Vascular  Dorsalis pedis and posterior tibial pulses are palpable  B/L.  Capillary return  WNL.  Temperature gradient is  WNL.  Skin turgor  WNL  Sensorium  Senn Weinstein monofilament wire  diminished  bilaterally.. Diminished  tactile sensation.  Nail Exam thick disfigured discolored nails with bone debris noted 3 through 5 of the left foot and 2 through 5 of the right foot.  Absence of nail plate hallux bilateral and second digit left foot.    Orthopedic  Exam  Muscle tone and muscle strength  WNL.  No limitations of motion feet  B/L.  No crepitus or joint effusion noted.  Foot type is unremarkable and digits show no abnormalities.  Bony prominences are unremarkable.  Skin  No open lesions.  Normal skin texture and turgor. Hemorrhagic callus sub 1 right foot.  Fissures noted sub 1 right.  Onychomycosis  X 7  B/L.  Debride callus right foot.  Iatrogenic lesion lesion noted upon debridement of callus.  Neosporin/DSD.  RTC 3 months.   Helane Gunther DPM

## 2018-07-30 ENCOUNTER — Ambulatory Visit: Payer: Medicare Other | Admitting: Podiatry

## 2018-10-15 ENCOUNTER — Ambulatory Visit: Payer: Medicare Other | Admitting: Podiatry

## 2018-11-19 ENCOUNTER — Ambulatory Visit: Payer: Medicare Other | Admitting: Podiatry

## 2019-01-06 ENCOUNTER — Other Ambulatory Visit: Payer: Self-pay

## 2019-01-06 ENCOUNTER — Encounter: Payer: Self-pay | Admitting: Podiatry

## 2019-01-06 ENCOUNTER — Ambulatory Visit (INDEPENDENT_AMBULATORY_CARE_PROVIDER_SITE_OTHER): Payer: Medicare Other | Admitting: Podiatry

## 2019-01-06 DIAGNOSIS — B351 Tinea unguium: Secondary | ICD-10-CM | POA: Diagnosis not present

## 2019-01-06 DIAGNOSIS — E1149 Type 2 diabetes mellitus with other diabetic neurological complication: Secondary | ICD-10-CM | POA: Diagnosis not present

## 2019-01-06 DIAGNOSIS — E1140 Type 2 diabetes mellitus with diabetic neuropathy, unspecified: Secondary | ICD-10-CM

## 2019-01-06 DIAGNOSIS — M79675 Pain in left toe(s): Secondary | ICD-10-CM | POA: Diagnosis not present

## 2019-01-06 DIAGNOSIS — M79674 Pain in right toe(s): Secondary | ICD-10-CM

## 2019-01-06 NOTE — Progress Notes (Signed)
This patient presents the office with chief complaint of long thick painful nails.  Patient has not been seen in over 6 months.  She previously had had nail surgery for the removal of the first and second toenails of the left foot and the right big toenail first.   She presents the office today for an evaluation and treatment of these 2 conditions.  Vascular  Dorsalis pedis and posterior tibial pulses are palpable  B/L.  Capillary return  WNL.  Temperature gradient is  WNL.  Skin turgor  WNL  Sensorium  Senn Weinstein monofilament wire  diminished  bilaterally.. Diminished  tactile sensation.  Nail Exam thick disfigured discolored nails with bone debris noted 3 through 5 of the left foot and 2 through 5 of the right foot.  Absence of nail plate hallux bilateral and second digit left foot.    Orthopedic  Exam  Muscle tone and muscle strength  WNL.  No limitations of motion feet  B/L.  No crepitus or joint effusion noted.  Foot type is unremarkable and digits show no abnormalities.  Bony prominences are unremarkable.  Skin  No open lesions.  Normal skin texture and turgor.   Fissures noted sub 1 right.  Onychomycosis  X 7  B/L.   RTC 3 months.   Gardiner Barefoot DPM

## 2019-01-20 ENCOUNTER — Other Ambulatory Visit: Payer: Self-pay

## 2019-01-20 DIAGNOSIS — Z20822 Contact with and (suspected) exposure to covid-19: Secondary | ICD-10-CM

## 2019-01-21 LAB — NOVEL CORONAVIRUS, NAA: SARS-CoV-2, NAA: NOT DETECTED

## 2019-04-07 ENCOUNTER — Ambulatory Visit: Payer: Medicare Other | Admitting: Podiatry

## 2019-05-06 ENCOUNTER — Ambulatory Visit: Payer: Medicare Other | Admitting: Podiatry

## 2019-06-23 ENCOUNTER — Ambulatory Visit: Payer: Medicare Other | Admitting: Podiatry

## 2019-08-11 ENCOUNTER — Ambulatory Visit: Payer: Medicare Other | Admitting: Podiatry

## 2019-10-06 DIAGNOSIS — R197 Diarrhea, unspecified: Secondary | ICD-10-CM | POA: Diagnosis present

## 2019-10-06 DIAGNOSIS — R0902 Hypoxemia: Secondary | ICD-10-CM | POA: Diagnosis present

## 2019-10-06 DIAGNOSIS — Z8249 Family history of ischemic heart disease and other diseases of the circulatory system: Secondary | ICD-10-CM

## 2019-10-06 DIAGNOSIS — N182 Chronic kidney disease, stage 2 (mild): Secondary | ICD-10-CM | POA: Diagnosis present

## 2019-10-06 DIAGNOSIS — E1142 Type 2 diabetes mellitus with diabetic polyneuropathy: Secondary | ICD-10-CM | POA: Diagnosis present

## 2019-10-06 DIAGNOSIS — I129 Hypertensive chronic kidney disease with stage 1 through stage 4 chronic kidney disease, or unspecified chronic kidney disease: Secondary | ICD-10-CM | POA: Diagnosis present

## 2019-10-06 DIAGNOSIS — R110 Nausea: Secondary | ICD-10-CM | POA: Diagnosis present

## 2019-10-06 DIAGNOSIS — U071 COVID-19: Principal | ICD-10-CM | POA: Diagnosis present

## 2019-10-06 DIAGNOSIS — Z981 Arthrodesis status: Secondary | ICD-10-CM

## 2019-10-06 DIAGNOSIS — Z823 Family history of stroke: Secondary | ICD-10-CM

## 2019-10-06 DIAGNOSIS — T380X5A Adverse effect of glucocorticoids and synthetic analogues, initial encounter: Secondary | ICD-10-CM | POA: Diagnosis present

## 2019-10-06 DIAGNOSIS — I2510 Atherosclerotic heart disease of native coronary artery without angina pectoris: Secondary | ICD-10-CM | POA: Diagnosis present

## 2019-10-06 DIAGNOSIS — Z79899 Other long term (current) drug therapy: Secondary | ICD-10-CM

## 2019-10-06 DIAGNOSIS — E785 Hyperlipidemia, unspecified: Secondary | ICD-10-CM | POA: Diagnosis present

## 2019-10-06 DIAGNOSIS — Z833 Family history of diabetes mellitus: Secondary | ICD-10-CM

## 2019-10-06 DIAGNOSIS — R778 Other specified abnormalities of plasma proteins: Secondary | ICD-10-CM | POA: Diagnosis present

## 2019-10-06 DIAGNOSIS — E1122 Type 2 diabetes mellitus with diabetic chronic kidney disease: Secondary | ICD-10-CM | POA: Diagnosis present

## 2019-10-06 DIAGNOSIS — Z800 Family history of malignant neoplasm of digestive organs: Secondary | ICD-10-CM

## 2019-10-06 DIAGNOSIS — Z7984 Long term (current) use of oral hypoglycemic drugs: Secondary | ICD-10-CM

## 2019-10-06 DIAGNOSIS — J1282 Pneumonia due to coronavirus disease 2019: Secondary | ICD-10-CM | POA: Diagnosis present

## 2019-10-06 NOTE — ED Triage Notes (Signed)
Pt arrived via [facility], from home with reports of COVID + on 8/5.  Pt c/o shortness of breath and not feeling well.   BP 170/130, 96%RA,   Afebrile EMS, CBG = 310

## 2019-10-07 ENCOUNTER — Encounter: Payer: Self-pay | Admitting: Emergency Medicine

## 2019-10-07 ENCOUNTER — Observation Stay: Payer: Medicare Other

## 2019-10-07 ENCOUNTER — Observation Stay
Admit: 2019-10-07 | Discharge: 2019-10-07 | Disposition: A | Discharge status: Home / Self Care | Payer: Medicare Other | Attending: Internal Medicine | Admitting: Internal Medicine

## 2019-10-07 ENCOUNTER — Emergency Department: Payer: Medicare Other

## 2019-10-07 ENCOUNTER — Ambulatory Visit: Payer: Medicare Other | Admitting: Podiatry

## 2019-10-07 ENCOUNTER — Other Ambulatory Visit: Payer: Self-pay

## 2019-10-07 ENCOUNTER — Inpatient Hospital Stay
Admission: EM | Admit: 2019-10-07 | Discharge: 2019-10-12 | DRG: 177 | Disposition: A | Discharge status: Home Health | Payer: Medicare Other | Attending: Internal Medicine | Admitting: Internal Medicine

## 2019-10-07 DIAGNOSIS — U071 COVID-19: Secondary | ICD-10-CM | POA: Diagnosis not present

## 2019-10-07 DIAGNOSIS — R778 Other specified abnormalities of plasma proteins: Secondary | ICD-10-CM

## 2019-10-07 DIAGNOSIS — E119 Type 2 diabetes mellitus without complications: Secondary | ICD-10-CM

## 2019-10-07 DIAGNOSIS — I10 Essential (primary) hypertension: Secondary | ICD-10-CM | POA: Diagnosis present

## 2019-10-07 DIAGNOSIS — J1282 Pneumonia due to coronavirus disease 2019: Secondary | ICD-10-CM

## 2019-10-07 DIAGNOSIS — R7989 Other specified abnormal findings of blood chemistry: Secondary | ICD-10-CM

## 2019-10-07 DIAGNOSIS — I400 Infective myocarditis: Secondary | ICD-10-CM

## 2019-10-07 LAB — URINALYSIS, COMPLETE (UACMP) WITH MICROSCOPIC
Bilirubin Urine: NEGATIVE
Glucose, UA: NEGATIVE mg/dL
Hgb urine dipstick: NEGATIVE
Ketones, ur: NEGATIVE mg/dL
Nitrite: NEGATIVE
Protein, ur: NEGATIVE mg/dL
Specific Gravity, Urine: 1.021 (ref 1.005–1.030)
pH: 6 (ref 5.0–8.0)

## 2019-10-07 LAB — CBC WITH DIFFERENTIAL/PLATELET
Abs Immature Granulocytes: 0.18 10*3/uL — ABNORMAL HIGH (ref 0.00–0.07)
Basophils Absolute: 0 10*3/uL (ref 0.0–0.1)
Basophils Relative: 0 %
Eosinophils Absolute: 0 10*3/uL (ref 0.0–0.5)
Eosinophils Relative: 0 %
HCT: 46.9 % — ABNORMAL HIGH (ref 36.0–46.0)
Hemoglobin: 16.4 g/dL — ABNORMAL HIGH (ref 12.0–15.0)
Immature Granulocytes: 1 %
Lymphocytes Relative: 7 %
Lymphs Abs: 1.1 10*3/uL (ref 0.7–4.0)
MCH: 32.3 pg (ref 26.0–34.0)
MCHC: 35 g/dL (ref 30.0–36.0)
MCV: 92.5 fL (ref 80.0–100.0)
Monocytes Absolute: 0.4 10*3/uL (ref 0.1–1.0)
Monocytes Relative: 2 %
Neutro Abs: 14.7 10*3/uL — ABNORMAL HIGH (ref 1.7–7.7)
Neutrophils Relative %: 90 %
Platelets: 320 10*3/uL (ref 150–400)
RBC: 5.07 MIL/uL (ref 3.87–5.11)
RDW: 12.2 % (ref 11.5–15.5)
WBC: 16.4 10*3/uL — ABNORMAL HIGH (ref 4.0–10.5)
nRBC: 0 % (ref 0.0–0.2)

## 2019-10-07 LAB — COMPREHENSIVE METABOLIC PANEL
ALT: 37 U/L (ref 0–44)
AST: 49 U/L — ABNORMAL HIGH (ref 15–41)
Albumin: 4.4 g/dL (ref 3.5–5.0)
Alkaline Phosphatase: 63 U/L (ref 38–126)
Anion gap: 16 — ABNORMAL HIGH (ref 5–15)
BUN: 31 mg/dL — ABNORMAL HIGH (ref 8–23)
CO2: 27 mmol/L (ref 22–32)
Calcium: 9.5 mg/dL (ref 8.9–10.3)
Chloride: 95 mmol/L — ABNORMAL LOW (ref 98–111)
Creatinine, Ser: 1.26 mg/dL — ABNORMAL HIGH (ref 0.44–1.00)
GFR calc Af Amer: 51 mL/min — ABNORMAL LOW (ref >60)
GFR calc non Af Amer: 44 mL/min — ABNORMAL LOW (ref >60)
Glucose, Bld: 292 mg/dL — ABNORMAL HIGH (ref 70–99)
Potassium: 3.9 mmol/L (ref 3.5–5.1)
Sodium: 138 mmol/L (ref 135–145)
Total Bilirubin: 0.9 mg/dL (ref 0.3–1.2)
Total Protein: 8.6 g/dL — ABNORMAL HIGH (ref 6.5–8.1)

## 2019-10-07 LAB — TROPONIN I (HIGH SENSITIVITY)
Troponin I (High Sensitivity): 294 ng/L (ref <18)
Troponin I (High Sensitivity): 270 ng/L (ref <18)

## 2019-10-07 LAB — GLUCOSE, CAPILLARY
Glucose-Capillary: 182 mg/dL — ABNORMAL HIGH (ref 70–99)
Glucose-Capillary: 193 mg/dL — ABNORMAL HIGH (ref 70–99)
Glucose-Capillary: 202 mg/dL — ABNORMAL HIGH (ref 70–99)
Glucose-Capillary: 224 mg/dL — ABNORMAL HIGH (ref 70–99)

## 2019-10-07 LAB — BLOOD GAS, VENOUS
Acid-Base Excess: 5.2 mmol/L — ABNORMAL HIGH (ref 0.0–2.0)
Bicarbonate: 29 mmol/L — ABNORMAL HIGH (ref 20.0–28.0)
O2 Saturation: 92.1 %
Patient temperature: 37
pCO2, Ven: 39 mmHg — ABNORMAL LOW (ref 44.0–60.0)
pH, Ven: 7.48 — ABNORMAL HIGH (ref 7.250–7.430)
pO2, Ven: 59 mmHg — ABNORMAL HIGH (ref 32.0–45.0)

## 2019-10-07 LAB — LIPASE, BLOOD: Lipase: 47 U/L (ref 11–51)

## 2019-10-07 LAB — BRAIN NATRIURETIC PEPTIDE: B Natriuretic Peptide: 139.6 pg/mL — ABNORMAL HIGH (ref 0.0–100.0)

## 2019-10-07 LAB — ABO/RH: ABO/RH(D): B POS

## 2019-10-07 LAB — FIBRIN DERIVATIVES D-DIMER (ARMC ONLY): Fibrin derivatives D-dimer (ARMC): 1449.85 ng/mL (FEU) — ABNORMAL HIGH (ref 0.00–499.00)

## 2019-10-07 LAB — HIV ANTIBODY (ROUTINE TESTING W REFLEX): HIV Screen 4th Generation wRfx: NONREACTIVE

## 2019-10-07 LAB — PROCALCITONIN: Procalcitonin: <0.1 ng/mL

## 2019-10-07 MED ORDER — IOHEXOL 350 MG/ML SOLN
60.0000 mL | Freq: Once | INTRAVENOUS | Status: AC | PRN
  Administered 2019-10-07: 60 mL via INTRAVENOUS

## 2019-10-07 MED ORDER — ACETAMINOPHEN 325 MG PO TABS: 650.0000 mg | ORAL_TABLET | Freq: Four times a day (QID) | ORAL | Status: DC | PRN

## 2019-10-07 MED ORDER — PROPRANOLOL HCL 20 MG PO TABS
10.0000 mg | ORAL_TABLET | Freq: Once | ORAL | Status: AC
  Administered 2019-10-07: 10 mg via ORAL
  Filled 2019-10-07: qty 1

## 2019-10-07 MED ORDER — INSULIN ASPART 100 UNIT/ML ~~LOC~~ SOLN
0.0000 [IU] | Freq: Every day | SUBCUTANEOUS | Status: DC
  Administered 2019-10-07: 2 [IU] via SUBCUTANEOUS
  Administered 2019-10-08: 21:00:00 3 [IU] via SUBCUTANEOUS
  Administered 2019-10-09: 4 [IU] via SUBCUTANEOUS
  Administered 2019-10-10: 22:00:00 5 [IU] via SUBCUTANEOUS
  Administered 2019-10-11: 4 [IU] via SUBCUTANEOUS
  Filled 2019-10-07 (×5): qty 1

## 2019-10-07 MED ORDER — ONDANSETRON HCL 4 MG/2ML IJ SOLN: 4.0000 mg | Freq: Four times a day (QID) | INTRAMUSCULAR | Status: DC | PRN

## 2019-10-07 MED ORDER — HYDROCOD POLST-CPM POLST ER 10-8 MG/5ML PO SUER
5.0000 mL | Freq: Two times a day (BID) | ORAL | Status: DC | PRN
  Administered 2019-10-07 – 2019-10-12 (×5): 5 mL via ORAL
  Filled 2019-10-07 (×6): qty 5

## 2019-10-07 MED ORDER — GUAIFENESIN-DM 100-10 MG/5ML PO SYRP
10.0000 mL | ORAL_SOLUTION | ORAL | Status: DC | PRN
  Administered 2019-10-10: 22:00:00 10 mL via ORAL
  Filled 2019-10-07 (×3): qty 10

## 2019-10-07 MED ORDER — CLONIDINE HCL 0.1 MG PO TABS
0.2000 mg | ORAL_TABLET | Freq: Once | ORAL | Status: AC
  Administered 2019-10-07: 0.2 mg via ORAL
  Filled 2019-10-07: qty 2

## 2019-10-07 MED ORDER — HYDROCHLOROTHIAZIDE 25 MG PO TABS
25.0000 mg | ORAL_TABLET | Freq: Every day | ORAL | Status: DC
  Administered 2019-10-08 – 2019-10-12 (×5): 25 mg via ORAL
  Filled 2019-10-07 (×5): qty 1

## 2019-10-07 MED ORDER — LINAGLIPTIN 5 MG PO TABS
5.0000 mg | ORAL_TABLET | Freq: Every day | ORAL | Status: DC
  Filled 2019-10-07: qty 1

## 2019-10-07 MED ORDER — ZINC SULFATE 220 (50 ZN) MG PO CAPS
220.0000 mg | ORAL_CAPSULE | Freq: Every day | ORAL | Status: DC
  Administered 2019-10-07 – 2019-10-12 (×6): 220 mg via ORAL
  Filled 2019-10-07 (×6): qty 1

## 2019-10-07 MED ORDER — EZETIMIBE 10 MG PO TABS
10.0000 mg | ORAL_TABLET | Freq: Every day | ORAL | Status: DC
  Administered 2019-10-09 – 2019-10-12 (×4): 10 mg via ORAL
  Filled 2019-10-07 (×6): qty 1

## 2019-10-07 MED ORDER — LACTATED RINGERS IV BOLUS
1000.0000 mL | Freq: Once | INTRAVENOUS | Status: AC
  Administered 2019-10-07: 1000 mL via INTRAVENOUS

## 2019-10-07 MED ORDER — ONDANSETRON HCL 4 MG PO TABS
4.0000 mg | ORAL_TABLET | Freq: Four times a day (QID) | ORAL | Status: DC | PRN
  Administered 2019-10-08 – 2019-10-09 (×2): 4 mg via ORAL
  Filled 2019-10-07 (×2): qty 1

## 2019-10-07 MED ORDER — ALBUTEROL SULFATE HFA 108 (90 BASE) MCG/ACT IN AERS
2.0000 | INHALATION_SPRAY | Freq: Four times a day (QID) | RESPIRATORY_TRACT | Status: DC
  Administered 2019-10-07 – 2019-10-12 (×17): 2 via RESPIRATORY_TRACT
  Filled 2019-10-07 (×2): qty 6.7

## 2019-10-07 MED ORDER — CLONIDINE HCL 0.1 MG PO TABS
0.2000 mg | ORAL_TABLET | Freq: Two times a day (BID) | ORAL | Status: DC
  Administered 2019-10-07 – 2019-10-12 (×11): 0.2 mg via ORAL
  Filled 2019-10-07 (×11): qty 2

## 2019-10-07 MED ORDER — BENAZEPRIL HCL 20 MG PO TABS
40.0000 mg | ORAL_TABLET | Freq: Every evening | ORAL | Status: DC
  Administered 2019-10-08 – 2019-10-11 (×4): 40 mg via ORAL
  Filled 2019-10-07 (×6): qty 2

## 2019-10-07 MED ORDER — BENAZEPRIL HCL 20 MG PO TABS
40.0000 mg | ORAL_TABLET | Freq: Once | ORAL | Status: AC
  Administered 2019-10-07: 40 mg via ORAL
  Filled 2019-10-07: qty 2

## 2019-10-07 MED ORDER — SODIUM CHLORIDE 0.9 % IV SOLN
200.0000 mg | Freq: Once | INTRAVENOUS | Status: AC
  Administered 2019-10-07: 200 mg via INTRAVENOUS
  Filled 2019-10-07: qty 200

## 2019-10-07 MED ORDER — SIMVASTATIN 20 MG PO TABS
40.0000 mg | ORAL_TABLET | Freq: Every evening | ORAL | Status: DC
  Administered 2019-10-07 – 2019-10-11 (×5): 40 mg via ORAL
  Filled 2019-10-07 (×5): qty 2

## 2019-10-07 MED ORDER — ASCORBIC ACID 500 MG PO TABS
500.0000 mg | ORAL_TABLET | Freq: Every day | ORAL | Status: DC
  Administered 2019-10-07 – 2019-10-12 (×6): 500 mg via ORAL
  Filled 2019-10-07 (×6): qty 1

## 2019-10-07 MED ORDER — ADULT MULTIVITAMIN W/MINERALS CH
1.0000 | ORAL_TABLET | Freq: Every day | ORAL | Status: DC
  Administered 2019-10-07 – 2019-10-12 (×6): 1 via ORAL
  Filled 2019-10-07 (×6): qty 1

## 2019-10-07 MED ORDER — PROPRANOLOL HCL 20 MG PO TABS
20.0000 mg | ORAL_TABLET | Freq: Three times a day (TID) | ORAL | Status: DC
  Administered 2019-10-07 – 2019-10-11 (×15): 20 mg via ORAL
  Filled 2019-10-07 (×18): qty 1

## 2019-10-07 MED ORDER — SODIUM CHLORIDE 0.9 % IV SOLN
100.0000 mg | Freq: Every day | INTRAVENOUS | Status: AC
  Administered 2019-10-08 – 2019-10-11 (×4): 100 mg via INTRAVENOUS
  Filled 2019-10-07 (×2): qty 20
  Filled 2019-10-07: qty 100
  Filled 2019-10-07: qty 20

## 2019-10-07 MED ORDER — PREDNISONE 20 MG PO TABS
40.0000 mg | ORAL_TABLET | Freq: Every day | ORAL | Status: DC
  Administered 2019-10-07 – 2019-10-10 (×4): 40 mg via ORAL
  Filled 2019-10-07 (×4): qty 2

## 2019-10-07 MED ORDER — INSULIN ASPART 100 UNIT/ML ~~LOC~~ SOLN
0.0000 [IU] | Freq: Three times a day (TID) | SUBCUTANEOUS | Status: DC
  Administered 2019-10-07: 18:00:00 5 [IU] via SUBCUTANEOUS
  Administered 2019-10-07 (×2): 3 [IU] via SUBCUTANEOUS
  Administered 2019-10-08 (×2): 5 [IU] via SUBCUTANEOUS
  Administered 2019-10-08: 09:00:00 3 [IU] via SUBCUTANEOUS
  Administered 2019-10-09: 09:00:00 2 [IU] via SUBCUTANEOUS
  Administered 2019-10-09 – 2019-10-10 (×2): 8 [IU] via SUBCUTANEOUS
  Administered 2019-10-10: 10:00:00 2 [IU] via SUBCUTANEOUS
  Administered 2019-10-10: 5 [IU] via SUBCUTANEOUS
  Administered 2019-10-11: 10:00:00 3 [IU] via SUBCUTANEOUS
  Administered 2019-10-11: 14:00:00 5 [IU] via SUBCUTANEOUS
  Administered 2019-10-11: 18:00:00 11 [IU] via SUBCUTANEOUS
  Administered 2019-10-12: 3 [IU] via SUBCUTANEOUS
  Administered 2019-10-12: 5 [IU] via SUBCUTANEOUS
  Filled 2019-10-07 (×16): qty 1

## 2019-10-07 MED ORDER — ENOXAPARIN SODIUM 40 MG/0.4ML ~~LOC~~ SOLN
40.0000 mg | SUBCUTANEOUS | Status: DC
  Administered 2019-10-07 – 2019-10-12 (×6): 40 mg via SUBCUTANEOUS
  Filled 2019-10-07 (×6): qty 0.4

## 2019-10-07 NOTE — ED Triage Notes (Signed)
Pt to triage via w/c with no distress noted; pt reports weakness, HA and diarrhea since this am

## 2019-10-07 NOTE — ED Notes (Signed)
Pt provided w/ phone to call family.  

## 2019-10-07 NOTE — Progress Notes (Signed)
PROGRESS NOTE    Jodi Chavez  VFI:[id number] DOB: 03-03-1950 DOA: 10/07/2019 PCP: Nolene Ebbs, MD   Brief Narrative: 69 year old with past medical history significant for diabetes, hypertension, hyperlipidemia, who received Covid vaccine varicella x2 presents with shortness of breath, cough.  Patient was tested positive for Covid on August 2.  Patient was able to see her PCP and she received a monoclonal antibody a few days prior to admission, but her symptoms continue to get worse for this reason she presented to the ED. Evaluation in the ED blood pressure elevated 199/100, troponins 290/270, D-dimer 1449.  Chest x-ray no acute disease. CTA: No evidence for pulmonary embolus. Patchy airspace opacities in the lower lobes bilaterally as well as the lingula compatible with multifocal airspace disease, likely viral pneumonia in the setting of COVID-19 infection. Coronary artery disease.   Assessment & Plan:   Principal Problem:   Breakthrough COVID-19 Active Problems:   Diabetes mellitus without complication (HCC)   Essential hypertension   Elevated troponin  1-Breakthrough COVID-19 infection, COVID-19 pneumonia: -CTA negative for PE.  Showed patchy airspace opacity in the lower lobes bilaterally as well as the lingula compatible with multifocal airspace disease. -Continue with Remdesivir.  -Continue with albuterol, vitamin C. -Patient was already on prednisone will continue.  2-Elevated troponin: -EKG without ST segment elevation. -Patient with shortness of breath, differential myocarditis. -Cardiology consulted. -Check 2D echo.  3-Hypertension; uncontrolled Continue with Inderal, hydrochlorothiazide, clonidine, benazepril.  4-Diabetes type 2: Hold hold glimepiride, Metformin while inpatient. Continue with sliding scale insulin  5-increased anion gap: Repeat be met. CBG 182 range.,  CO2 27. Hold Metformin.   Estimated body mass index is 38.92 kg/m as calculated from  the following:   Height as of this encounter: '5\' 1"'$  (1.549 m).   Weight as of this encounter: 93.4 kg.   DVT prophylaxis: Lovenox Code Status: Full code Family Communication: Care discussed directly with patient Disposition Plan:  Status is: Observation  The patient remains OBS appropriate and will d/c before 2 midnights.  Dispo: The patient is from: Home              Anticipated d/c is to: Home              Anticipated d/c date is: 1 day              Patient currently is not medically stable to d/c.        Consultants:   Cardiology   Procedures:   ECHO pending.   Antimicrobials:    Subjective: Patient seen and examined. She denies chest pain. She reported SOB. Mild cough.    Objective: Vitals:   10/07/19 0600 10/07/19 0700 10/07/19 0800 10/07/19 1039  BP: (!) 154/82 (!) 145/73 (!) 147/73 (!) 160/91  Pulse: 75 65 71 82  Resp: 10 18 (!) 21 18  Temp:      TempSrc:      SpO2:  97% 93% 96%  Weight:      Height:       No intake or output data in the 24 hours ending 10/07/19 1208 Filed Weights   10/07/19 0006  Weight: 93.4 kg    Examination:  General exam: Appears calm and comfortable  Respiratory system: Clear to auscultation. Respiratory effort normal. Cardiovascular system: S1 & S2 heard, RRR. No JVD, murmurs, rubs, gallops or clicks. No pedal edema. Gastrointestinal system: Abdomen is nondistended, soft and nontender. No organomegaly or masses felt. Normal bowel sounds heard. Central nervous system: Alert  and oriented. No focal neurological deficits. Extremities: Symmetric 5 x 5 power. Skin: No rashes, lesions or ulcers   Data Reviewed: I have personally reviewed following labs and imaging studies  CBC: Recent Labs  Lab 10/07/19 0010  WBC 16.4*  NEUTROABS 14.7*  HGB 16.4*  HCT 46.9*  MCV 92.5  PLT 557   Basic Metabolic Panel: Recent Labs  Lab 10/07/19 0010  NA 138  K 3.9  CL 95*  CO2 27  GLUCOSE 292*  BUN 31*  CREATININE 1.26*    CALCIUM 9.5   GFR: Estimated Creatinine Clearance: 44.5 mL/min (A) (by C-G formula based on SCr of 1.26 mg/dL (H)). Liver Function Tests: Recent Labs  Lab 10/07/19 0010  AST 49*  ALT 37  ALKPHOS 63  BILITOT 0.9  PROT 8.6*  ALBUMIN 4.4   Recent Labs  Lab 10/07/19 0010  LIPASE 47   No results for input(s): AMMONIA in the last 168 hours. Coagulation Profile: No results for input(s): INR, PROTIME in the last 168 hours. Cardiac Enzymes: No results for input(s): CKTOTAL, CKMB, CKMBINDEX, TROPONINI in the last 168 hours. BNP (last 3 results) No results for input(s): PROBNP in the last 8760 hours. HbA1C: No results for input(s): HGBA1C in the last 72 hours. CBG: Recent Labs  Lab 10/07/19 0744 10/07/19 1135  GLUCAP 182* 193*   Lipid Profile: No results for input(s): CHOL, HDL, LDLCALC, TRIG, CHOLHDL, LDLDIRECT in the last 72 hours. Thyroid Function Tests: No results for input(s): TSH, T4TOTAL, FREET4, T3FREE, THYROIDAB in the last 72 hours. Anemia Panel: No results for input(s): VITAMINB12, FOLATE, FERRITIN, TIBC, IRON, RETICCTPCT in the last 72 hours. Sepsis Labs: Recent Labs  Lab 10/07/19 0240  PROCALCITON <0.10    No results found for this or any previous visit (from the past 240 hour(s)).       Radiology Studies: CT Angio Chest PE W and/or Wo Contrast  Result Date: 10/07/2019 CLINICAL DATA:  PE suspected.  High probability.  COVID positive. EXAM: CT ANGIOGRAPHY CHEST WITH CONTRAST TECHNIQUE: Multidetector CT imaging of the chest was performed using the standard protocol during bolus administration of intravenous contrast. Multiplanar CT image reconstructions and MIPs were obtained to evaluate the vascular anatomy. CONTRAST:  86m OMNIPAQUE IOHEXOL 350 MG/ML SOLN COMPARISON:  One-view chest x-ray 10/07/2019 FINDINGS: Cardiovascular: Heart size is normal. Extensive coronary artery calcifications are present. Atherosclerotic changes are present in the aorta and at  the origins the great vessels without significant stenosis. No aneurysm is present. Pulmonary artery opacification is excellent. No focal filling defects are present to suggest pulmonary embolus. Mediastinum/Nodes: No significant mediastinal, hilar, or axillary adenopathy is present. Thoracic inlet is within normal limits. Esophagus is normal. Lungs/Pleura: Patchy airspace opacities are present in the lower lobes bilaterally as well as the lingula. No significant consolidation is present. No significant pleural disease is present. Upper Abdomen: Hyperdense gallstones are present. No inflammatory changes are present in the gallbladder. Visualized liver is within normal limits. Upper abdomen is otherwise unremarkable. Musculoskeletal: Multilevel degenerative changes are present in the thoracic spine. Vacuum disc is present at T12-L1. Vertebral body heights are maintained. No acute or healing fractures are present. Ribs are unremarkable. No focal lytic or blastic lesions are present. Review of the MIP images confirms the above findings. IMPRESSION: 1. No evidence for pulmonary embolus. 2. Patchy airspace opacities in the lower lobes bilaterally as well as the lingula compatible with multifocal airspace disease, likely viral pneumonia in the setting of COVID-19 infection. 3. Coronary artery disease.  4. Cholelithiasis without evidence for cholecystitis. 5. Aortic Atherosclerosis (ICD10-I70.0). Electronically Signed   By: San Morelle M.D.   On: 10/07/2019 04:16   DG Chest Portable 1 View  Result Date: 10/07/2019 CLINICAL DATA:  COVID positive EXAM: PORTABLE CHEST 1 VIEW COMPARISON:  None. FINDINGS: The heart size and mediastinal contours are within normal limits. Both lungs are clear. The visualized skeletal structures are unremarkable. IMPRESSION: No active disease. Electronically Signed   By: Prudencio Pair M.D.   On: 10/07/2019 02:25        Scheduled Meds:  albuterol  2 puff Inhalation Q6H    vitamin C  500 mg Oral Daily   benazepril  40 mg Oral QPM   cloNIDine  0.2 mg Oral BID   enoxaparin (LOVENOX) injection  40 mg Subcutaneous Q24H   [START ON 10/08/2019] ezetimibe  10 mg Oral QPC breakfast   [START ON 10/08/2019] hydrochlorothiazide  25 mg Oral QPC breakfast   insulin aspart  0-15 Units Subcutaneous TID WC   insulin aspart  0-5 Units Subcutaneous QHS   multivitamin with minerals  1 tablet Oral Daily   predniSONE  40 mg Oral Daily   propranolol  20 mg Oral TID   simvastatin  40 mg Oral QPM   zinc sulfate  220 mg Oral Daily   Continuous Infusions:  [START ON 10/08/2019] remdesivir 100 mg in NS 100 mL       LOS: 0 days    Time spent: 35 minutes.     [name], MD [facility]   If 7PM-7AM, please contact night-coverage [email or web address]  10/07/2019, 12:08 PM

## 2019-10-07 NOTE — Progress Notes (Signed)
*  PRELIMINARY RESULTS* Echocardiogram 2D Echocardiogram has been performed.  Cristela Blue 10/07/2019, 1:57 PM

## 2019-10-07 NOTE — Consult Note (Signed)
[facility] Consultation [facility]  Patient ID: Jodi Chavez, MRN: [id number], DOB/AGE: Jan 02, 1951 69 y.o. Admit date: 10/07/2019   Date of Consult: 10/07/2019 Primary Physician: Fleet ContrasAvbuere, Edwin, MD Primary Cardiologist: None  Chief Complaint: No chief complaint on file.  Reason for Consult: Elevated troponin  HPI: 69 y.o. female with known diabetes hypertension hyperlipidemia chronic kidney disease who has been on appropriate medication management for risk factor treatment.  The patient has had no evidence of significant new symptoms of cardiovascular disease heart failure or true angina but has recently contracted Covid after being in contact with sister who had Covid.  The patient has received monoclonal antibodies as well as other steroid treatment for which the patient has had some improvements but has been concerned.  When seen in the emergency room the EKG has shown normal sinus rhythm with poor R wave progression.  CT scan was normal other than showing coronary artery atherosclerosis and or calcification.  Chest x-ray was normal.  Troponin was 294 possibly consistent with demand ischemia and or of myocarditis.  There does not appear to be any acute non-ST elevation myocardial infarction at this time.  Past Medical History:  Diagnosis Date  . Arthritis    hip  . Constipation    uses laxatives several times a week  . Depression   . Diabetes mellitus    takes Metformin and Amaryl daily  . Dizziness    occasionally and related to meds   . Early cataracts, bilateral   . History of bronchitis    last time several yrs ago  . History of migraine    many yrs ago  . Hyperlipidemia    takes Zetia and Zocor daily  . Hypertension    takes Benazepril nightly and Propranolol tid and Clonidine daily  . Insomnia   . Low back pain   . Peripheral edema   . Peripheral neuropathy   . PONV (postoperative nausea and vomiting)   . Slow urinary stream    occasionally      Surgical  History:  Past Surgical History:  Procedure Laterality Date  . COLONOSCOPY    . d&c/hysteroscopy/ablation    . DILATION AND CURETTAGE OF UTERUS     couple of times  . ESOPHAGOGASTRODUODENOSCOPY    . 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 POSTERIOR CERVICAL FUSION/FORAMINOTOMY  10/15/2011   Procedure: POSTERIOR CERVICAL FUSION/FORAMINOTOMY LEVEL 2;  Surgeon: Kerrin ChampagneJames E Nitka, MD;  Location: [facility] OR;  Service: Orthopedics;  Laterality: N/A;  Right C6-7, C7-T1 Foraminotomy with excision HNP C7-T1  . UPPER GASTROINTESTINAL ENDOSCOPY       Home Meds: Prior to Admission medications   Medication Sig Start Date End Date Taking? Authorizing Provider  benazepril (LOTENSIN) 40 MG tablet Take 40 mg by mouth every evening.    Yes [provider]  calcium-vitamin D (OSCAL WITH D) 500-200 MG-UNIT per tablet Take 1 tablet by mouth daily after breakfast.    Yes [provider]  cetirizine (ZYRTEC) 10 MG tablet Take 10 mg by mouth daily as needed. 01/02/18  Yes [provider]  cloNIDine (CATAPRES) 0.2 MG tablet Take 0.2 mg by mouth 2 (two) times daily.    Yes [provider]  colchicine 0.6 MG tablet Take 0.6 mg by mouth daily. 10/01/19 10/31/19 Yes [provider]  diphenhydrAMINE (BENADRYL) 25 MG tablet Take 1 tablet (25 mg total) by mouth every 6 (six) hours as needed for itching. 11/25/14  Yes Rai,  Ripudeep K, MD  doxycycline (VIBRAMYCIN) 100 MG capsule Take 100 mg by mouth 2 (two) times daily. 10/01/19 10/08/19 Yes [provider]  ezetimibe (ZETIA) 10 MG tablet Take 10 mg by mouth daily after breakfast.    Yes [provider]  fluticasone (FLONASE) 50 MCG/ACT nasal spray Place 2 sprays into both nostrils daily.  06/11/14  Yes [provider]  furosemide (LASIX) 20 MG tablet TAKE 1 TABLET BY MOUTH ONCE DAILY AS NEEDED FOR LEG SWELLING 03/02/18  Yes [provider]  gabapentin (NEURONTIN) 300 MG capsule Take 300 mg  by mouth 3 (three) times daily.   Yes [provider]  Garlic 1000 MG CAPS Take 5,000 mg by mouth daily after breakfast.    Yes [provider]  glimepiride (AMARYL) 2 MG tablet Take 2 mg by mouth daily before breakfast.   Yes [provider]  Glucosamine-Chondroit-Vit C-Mn (GLUCOSAMINE CHONDR 1500 COMPLX PO) Take 1 tablet by mouth daily after breakfast.    Yes [provider]  hydrochlorothiazide (HYDRODIURIL) 25 MG tablet Take 25 mg by mouth daily after breakfast.    Yes [provider]  metFORMIN (GLUCOPHAGE) 850 MG tablet Take 850 mg by mouth daily with breakfast.   Yes [provider]  Multiple Vitamin (MULITIVITAMIN WITH MINERALS) TABS Take 1 tablet by mouth daily after breakfast.    Yes [provider]  nabumetone (RELAFEN) 500 MG tablet TAKE 1 TABLET BY MOUTH 2-3 TIMES DAILY AS NEEDED FOR INFLAMMATION 10/31/17  Yes Kathryne Hitch, MD  OVER THE COUNTER MEDICATION Apply 1 application topically daily. sween cream applied to groin area daily to prevent rash   Yes [provider]  pantoprazole (PROTONIX) 40 MG tablet Take 1 tablet (40 mg total) by mouth daily. 11/25/14  Yes Rai, Ripudeep K, MD  predniSONE (DELTASONE) 20 MG tablet Take 40 mg by mouth daily. 10/01/19 10/11/19 Yes [provider]  PROAIR HFA 108 (90 BASE) MCG/ACT inhaler Inhale 2 puffs into the lungs every 4 (four) hours as needed for wheezing or shortness of breath.  06/11/14  Yes [provider]  promethazine (PHENERGAN) 25 MG tablet Take 1 tablet (25 mg total) by mouth every 6 (six) hours as needed for nausea. 07/20/17  Yes Molpus, John, MD  propranolol (INDERAL) 20 MG tablet Take 20 mg by mouth 3 (three) times daily.  08/15/16  Yes [provider]  Sennosides 25 MG TABS Take 2 tablets by mouth daily as needed (constipation). Pt takes every other day to every 3 days    Yes [provider]  simvastatin (ZOCOR) 40 MG tablet  Take 40 mg by mouth every evening.   Yes [provider]  traMADol (ULTRAM) 50 MG tablet TAKE ONE-HALF TO ONE TABLET BY MOUTH EVERY 4 TO 6 HOURS AS NEEDED FOR PAIN 04/17/16  Yes Kerrin Champagne, MD  DOXEPIN HCL PO Take by mouth.    05/10/11  [provider]  GABAPENTIN, PHN, PO Take by mouth.    05/10/11  [provider]    Inpatient Medications:  . albuterol  2 puff Inhalation Q6H  . vitamin C  500 mg Oral Daily  . benazepril  40 mg Oral QPM  . cloNIDine  0.2 mg Oral BID  . enoxaparin (LOVENOX) injection  40 mg Subcutaneous Q24H  . [START ON 10/08/2019] ezetimibe  10 mg Oral QPC breakfast  . [START ON 10/08/2019] hydrochlorothiazide  25 mg Oral QPC breakfast  . insulin aspart  0-15 Units  Subcutaneous TID WC  . insulin aspart  0-5 Units Subcutaneous QHS  . multivitamin with minerals  1 tablet Oral Daily  . predniSONE  40 mg Oral Daily  . propranolol  20 mg Oral TID  . simvastatin  40 mg Oral QPM  . zinc sulfate  220 mg Oral Daily   . [START ON 10/08/2019] remdesivir 100 mg in NS 100 mL      Allergies:  Allergies  Allergen Reactions  . Naproxen Sodium Rash    anaprox    Social History   Socioeconomic History  . Marital status: Single    Spouse name: Not on file  . Number of children: Not on file  . Years of education: Not on file  . Highest education level: Not on file  Occupational History  . Not on file  Tobacco Use  . Smoking status: Never Smoker  . Smokeless tobacco: Never Used  Substance and Sexual Activity  . Alcohol use: No  . Drug use: No  . Sexual activity: Never  Other Topics Concern  . Not on file  Social History Narrative  . Not on file   Social Determinants of Health   Financial Resource Strain:   . Difficulty of Paying Living Expenses:   Food Insecurity:   . Worried About Programme researcher, broadcasting/film/video in the Last Year:   . Barista in the Last Year:   Transportation Needs:   . Freight forwarder (Medical):   Marland Kitchen Lack of  Transportation (Non-Medical):   Physical Activity:   . Days of Exercise per Week:   . Minutes of Exercise per Session:   Stress:   . Feeling of Stress :   Social Connections:   . Frequency of Communication with Friends and Family:   . Frequency of Social Gatherings with Friends and Family:   . Attends Religious Services:   . Active Member of Clubs or Organizations:   . Attends Banker Meetings:   Marland Kitchen Marital Status:   Intimate Partner Violence:   . Fear of Current or Ex-Partner:   . Emotionally Abused:   Marland Kitchen Physically Abused:   . Sexually Abused:      Family History  Problem Relation Age of Onset  . Cancer Mother   . Stroke Mother   . Diabetes Mother   . Hypertension Mother   . Colon cancer Mother 41       died 27-Jun-2005  . Gout Father   . Heart disease Father      Review of Systems As per prime doc.  Labs: No results for input(s): CKTOTAL, CKMB, TROPONINI in the last 72 hours. Lab Results  Component Value Date   WBC 16.4 (H) 10/07/2019   HGB 16.4 (H) 10/07/2019   HCT 46.9 (H) 10/07/2019   MCV 92.5 10/07/2019   PLT 320 10/07/2019    Recent Labs  Lab 10/07/19 0010  NA 138  K 3.9  CL 95*  CO2 27  BUN 31*  CREATININE 1.26*  CALCIUM 9.5  PROT 8.6*  BILITOT 0.9  ALKPHOS 63  ALT 37  AST 49*  GLUCOSE 292*   Lab Results  Component Value Date   CHOL 123 11/01/2009   HDL 36 (L) 11/01/2009   LDLCALC 56 11/01/2009   TRIG 155 (H) 11/01/2009   No results found for: DDIMER  Radiology/Studies:  CT Angio Chest PE W and/or Wo Contrast  Result Date: 10/07/2019 CLINICAL DATA:  PE suspected.  High probability.  COVID positive.  EXAM: CT ANGIOGRAPHY CHEST WITH CONTRAST TECHNIQUE: Multidetector CT imaging of the chest was performed using the standard protocol during bolus administration of intravenous contrast. Multiplanar CT image reconstructions and MIPs were obtained to evaluate the vascular anatomy. CONTRAST:  40mL OMNIPAQUE IOHEXOL 350 MG/ML SOLN  COMPARISON:  One-view chest x-ray 10/07/2019 FINDINGS: Cardiovascular: Heart size is normal. Extensive coronary artery calcifications are present. Atherosclerotic changes are present in the aorta and at the origins the great vessels without significant stenosis. No aneurysm is present. Pulmonary artery opacification is excellent. No focal filling defects are present to suggest pulmonary embolus. Mediastinum/Nodes: No significant mediastinal, hilar, or axillary adenopathy is present. Thoracic inlet is within normal limits. Esophagus is normal. Lungs/Pleura: Patchy airspace opacities are present in the lower lobes bilaterally as well as the lingula. No significant consolidation is present. No significant pleural disease is present. Upper Abdomen: Hyperdense gallstones are present. No inflammatory changes are present in the gallbladder. Visualized liver is within normal limits. Upper abdomen is otherwise unremarkable. Musculoskeletal: Multilevel degenerative changes are present in the thoracic spine. Vacuum disc is present at T12-L1. Vertebral body heights are maintained. No acute or healing fractures are present. Ribs are unremarkable. No focal lytic or blastic lesions are present. Review of the MIP images confirms the above findings. IMPRESSION: 1. No evidence for pulmonary embolus. 2. Patchy airspace opacities in the lower lobes bilaterally as well as the lingula compatible with multifocal airspace disease, likely viral pneumonia in the setting of COVID-19 infection. 3. Coronary artery disease. 4. Cholelithiasis without evidence for cholecystitis. 5. Aortic Atherosclerosis (ICD10-I70.0). Electronically Signed   By: Marin Roberts M.D.   On: 10/07/2019 04:16   DG Chest Portable 1 View  Result Date: 10/07/2019 CLINICAL DATA:  COVID positive EXAM: PORTABLE CHEST 1 VIEW COMPARISON:  None. FINDINGS: The heart size and mediastinal contours are within normal limits. Both lungs are clear. The visualized skeletal  structures are unremarkable. IMPRESSION: No active disease. Electronically Signed   By: Jonna Clark M.D.   On: 10/07/2019 02:25    EKG: Normal sinus rhythm with poor R wave progression  Weights: Filed Weights   10/07/19 0006  Weight: 93.4 kg     Physical Exam: Blood pressure (!) 162/81, pulse 82, temperature 98.6 F (37 C), temperature source Oral, resp. rate 17, height 5\' 1"  (1.549 m), weight 93.4 kg, SpO2 96 %. Body mass index is 38.92 kg/m. As per prime doc    Assessment: 69 year old female with diabetes hypertension hyperlipidemia coronary atherosclerosis seen by 73 CAT scan chronic kidney disease with continued concerns and symptoms of Covid respiratory infection with a slight elevated troponin most consistent with either demand ischemia and/or myocarditis to a mild degree without evidence of acute coronary syndrome or congestive heart failure  Plan: 1.  Continue supportive care for acute Covid infection 2.  Continue risk factor modification medication management as before including ACE inhibitor clonidine hydrochlorothiazide and propranolol 3.  Echocardiogram for LV systolic dysfunction valvular heart disease elevated troponin 4.  Additional treatment options based on above  Signed, [place] M.D. [facility] 10/07/2019, 1:48 PM

## 2019-10-07 NOTE — ED Notes (Signed)
Pt provided w/breakfast tray.

## 2019-10-07 NOTE — Progress Notes (Signed)
Remdesivir - Pharmacy Brief Note   O:  CXR: IMPRESSION: 1. No evidence for pulmonary embolus. 2. Patchy airspace opacities in the lower lobes bilaterally as well as the lingula compatible with multifocal airspace disease, likely viral pneumonia in the setting of COVID-19 infection. 3. Coronary artery disease. 4. Cholelithiasis without evidence for cholecystitis. 5. Aortic Atherosclerosis (ICD10-I70.0). SpO2: 95 - 97% on RA   A/P:  Remdesivir 200 mg IVPB once followed by 100 mg IVPB daily x 4 days.   Thomasene Ripple, PharmD, BCPS Clinical Pharmacist 10/07/2019 4:25 AM

## 2019-10-07 NOTE — ED Notes (Signed)
Assisted pt to toilet in room. Moved bed over, pt states she uses walker at home.

## 2019-10-07 NOTE — ED Notes (Signed)
Pt resting comfortably in bed at lowest position. Pt on RA.

## 2019-10-07 NOTE — H&P (Signed)
History and Physical    Jodi Chavez WCB:[id number] DOB: 11-Nov-1950 DOA: 10/07/2019  PCP: Fleet Contras, MD   Patient coming from: Home  I have personally briefly reviewed patient's old medical records in [facility]  Chief Complaint: Shortness of breath, Covid positive since 09/29/2019  HPI: Jodi Chavez is a 69 y.o. female with medical history significant for DM, HTN, HLD who received Covid vaccine with Pfizer x2 who presents with cough and shortness of breath starting after his sister got diagnosed with Covid a couple weeks prior.  She tested positive on August 2.  She denies fever, chills or chest pain.  Has a mild dry cough and some diarrhea but her most prominent symptom is shortness of breath with exertion.  She saw her PCP who was able to give her monoclonal antibodies a few days ago but her symptoms continued prompting visit to the ER ED Course: On arrival O2 sat 95% on room air.  Vitals unremarkable except for BP 199/100 blood work notable for troponin of 294/270, D-dimer 1449.85.  Venous blood gas consistent with mild hyperventilation.  WBC elevated at 16,000, procalcitonin less than 0.10.  BNP elevated at 139.  Chest x-ray showed no acute disease.  CTA chest PE protocol pending.  Patient started on remdesivir.  Hospitalist consulted for admission.  Review of Systems: As per HPI otherwise all other systems on review of systems negative.    Past Medical History:  Diagnosis Date  . Arthritis    hip  . Constipation    uses laxatives several times a week  . Depression   . Diabetes mellitus    takes Metformin and Amaryl daily  . Dizziness    occasionally and related to meds   . Early cataracts, bilateral   . History of bronchitis    last time several yrs ago  . History of migraine    many yrs ago  . Hyperlipidemia    takes Zetia and Zocor daily  . Hypertension    takes Benazepril nightly and Propranolol tid and Clonidine daily  . Insomnia   . Low back pain   .  Peripheral edema   . Peripheral neuropathy   . PONV (postoperative nausea and vomiting)   . Slow urinary stream    occasionally    Past Surgical History:  Procedure Laterality Date  . COLONOSCOPY    . d&c/hysteroscopy/ablation    . DILATION AND CURETTAGE OF UTERUS     couple of times  . ESOPHAGOGASTRODUODENOSCOPY    . 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 POSTERIOR CERVICAL FUSION/FORAMINOTOMY  10/15/2011   Procedure: POSTERIOR CERVICAL FUSION/FORAMINOTOMY LEVEL 2;  Surgeon: Kerrin Champagne, MD;  Location: [facility] OR;  Service: Orthopedics;  Laterality: N/A;  Right C6-7, C7-T1 Foraminotomy with excision HNP C7-T1  . UPPER GASTROINTESTINAL ENDOSCOPY       reports that she has never smoked. She has never used smokeless tobacco. She reports that she does not drink alcohol and does not use drugs.  Allergies  Allergen Reactions  . Naproxen Sodium Rash    anaprox    Family History  Problem Relation Age of Onset  . Cancer Mother   . Stroke Mother   . Diabetes Mother   . Hypertension Mother   . Colon cancer Mother 81       died 06-30-2005  . Gout Father   . Heart disease Father       Prior to Admission medications  Medication Sig Start Date End Date Taking? Authorizing Provider  acetaminophen (TYLENOL) 500 MG tablet Take 500-1,000 mg by mouth 3 (three) times daily. 1000 mg every morning, 500 mg every afternoon, and 1000 mg every night    [provider]  benazepril (LOTENSIN) 40 MG tablet Take 40 mg by mouth every evening.     [provider]  calcium-vitamin D (OSCAL WITH D) 500-200 MG-UNIT per tablet Take 1 tablet by mouth daily after breakfast.     [provider]  cetirizine (ZYRTEC) 10 MG tablet Take 10 mg by mouth daily as needed. 01/02/18   [provider]  cloNIDine (CATAPRES) 0.2 MG tablet Take 0.2 mg by mouth 2 (two) times daily.     [provider]  diphenhydrAMINE (BENADRYL) 25 MG tablet Take 1 tablet (25 mg  total) by mouth every 6 (six) hours as needed for itching. 11/25/14   Rai, Ripudeep K, MD  doxepin (SINEQUAN) 50 MG capsule Take 100 mg by mouth at bedtime.    [provider]  ezetimibe (ZETIA) 10 MG tablet Take 10 mg by mouth daily after breakfast.     [provider]  fluticasone (FLONASE) 50 MCG/ACT nasal spray Place 2 sprays into both nostrils daily.  06/11/14   [provider]  furosemide (LASIX) 20 MG tablet TAKE 1 TABLET BY MOUTH ONCE DAILY AS NEEDED FOR LEG SWELLING 03/02/18   [provider]  gabapentin (NEURONTIN) 300 MG capsule Take 300 mg by mouth 3 (three) times daily.    [provider]  Garlic 1000 MG CAPS Take 5,000 mg by mouth daily after breakfast.     [provider]  glimepiride (AMARYL) 2 MG tablet Take 2 mg by mouth daily before breakfast.    [provider]  Glucosamine-Chondroit-Vit C-Mn (GLUCOSAMINE CHONDR 1500 COMPLX PO) Take 1 tablet by mouth daily after breakfast.     [provider]  hydrochlorothiazide (HYDRODIURIL) 25 MG tablet Take 25 mg by mouth daily after breakfast.     [provider]  loratadine (CLARITIN) 10 MG tablet Take 10 mg by mouth daily after breakfast. Reported on 07/12/2015    [provider]  metFORMIN (GLUCOPHAGE) 850 MG tablet Take 850 mg by mouth daily with breakfast.    [provider]  Multiple Vitamin (MULITIVITAMIN WITH MINERALS) TABS Take 1 tablet by mouth daily after breakfast.     [provider]  nabumetone (RELAFEN) 500 MG tablet TAKE 1 TABLET BY MOUTH 2-3 TIMES DAILY AS NEEDED FOR INFLAMMATION 10/31/17   Kathryne Hitch, MD  OVER THE COUNTER MEDICATION Apply 1 application topically daily. sween cream applied to groin area daily to prevent rash    [provider]  pantoprazole (PROTONIX) 40 MG tablet Take 1 tablet (40 mg total) by mouth daily. 11/25/14   Rai, Delene Ruffini, MD  polyethylene glycol (MIRALAX / GLYCOLAX) packet  Take 17 g by mouth daily. Patient taking differently: Take 17 g by mouth daily as needed for moderate constipation.  11/25/14   Rai, Ripudeep Kirtland Bouchard, MD  PROAIR HFA 108 (90 BASE) MCG/ACT inhaler Inhale 2 puffs into the lungs every 4 (four) hours as needed for wheezing or shortness of breath.  06/11/14   [provider]  promethazine (PHENERGAN) 25 MG tablet Take 1 tablet (25 mg total) by mouth every 6 (six) hours as needed for nausea. 07/20/17   Molpus, John, MD  propranolol (INDERAL) 20 MG tablet Take 20 mg by mouth 3 (three) times daily.  08/15/16   [provider]  Sennosides 25 MG TABS Take 2 tablets by mouth daily as needed (constipation). Pt takes every other day to every 3 days     [provider]  simvastatin (ZOCOR) 40 MG tablet Take 40 mg by mouth every evening.    [provider]  traMADol (ULTRAM) 50 MG tablet TAKE ONE-HALF TO ONE TABLET BY MOUTH EVERY 4 TO 6 HOURS AS NEEDED FOR PAIN 04/17/16   Kerrin ChampagneNitka, James E, MD  UNABLE TO FIND Take by mouth.    [provider]  UNABLE TO FIND Take by mouth.    [provider]  DOXEPIN HCL PO Take by mouth.    05/10/11  [provider]  GABAPENTIN, PHN, PO Take by mouth.    05/10/11  [provider]    Physical Exam: Vitals:   10/07/19 0005 10/07/19 0006 10/07/19 0300  BP: (!) 199/100  (!) 190/101  Pulse: 94  93  Resp: 18  (!) 21  Temp: 98.6 F (37 C)    TempSrc: Oral    SpO2: 95%    Weight:  93.4 kg   Height:  5\' 1"  (1.549 m)      Vitals:   10/07/19 0005 10/07/19 0006 10/07/19 0300  BP: (!) 199/100  (!) 190/101  Pulse: 94  93  Resp: 18  (!) 21  Temp: 98.6 F (37 C)    TempSrc: Oral    SpO2: 95%    Weight:  93.4 kg   Height:  5\' 1"  (1.549 m)       Constitutional: Alert and oriented x 3 . Not in any apparent distress HEENT:      Head: Normocephalic and atraumatic.         Eyes: PERLA, EOMI, Conjunctivae are normal. Sclera is non-icteric.       Mouth/Throat: Mucous  membranes are moist.       Neck: Supple with no signs of meningismus. Cardiovascular: Regular rate and rhythm. No murmurs, gallops, or rubs. 2+ symmetrical distal pulses are present . No JVD. No LE edema Respiratory: Respiratory effort increased .Lungs sounds clear bilaterally. No wheezes, crackles, or rhonchi.  Gastrointestinal: Soft, non tender, and non distended with positive bowel sounds. No rebound or guarding. Genitourinary: No CVA tenderness. Musculoskeletal: Nontender with normal range of motion in all extremities. No cyanosis, or erythema of extremities. Neurologic: Normal speech and language. Face is symmetric. Moving all extremities. No gross focal neurologic deficits . Skin: Skin is warm, dry.  No rash or ulcers Psychiatric: Mood and affect are normal Speech and behavior are normal   Labs on Admission: I have personally reviewed following labs and imaging studies  CBC: Recent Labs  Lab 10/07/19 0010  WBC 16.4*  NEUTROABS 14.7*  HGB 16.4*  HCT 46.9*  MCV 92.5  PLT 320   Basic Metabolic Panel: Recent Labs  Lab 10/07/19 0010  NA 138  K 3.9  CL 95*  CO2 27  GLUCOSE 292*  BUN 31*  CREATININE 1.26*  CALCIUM 9.5   GFR: Estimated Creatinine Clearance: 44.5 mL/min (A) (by C-G formula based on SCr of 1.26 mg/dL (H)). Liver Function Tests: Recent Labs  Lab 10/07/19 0010  AST 49*  ALT 37  ALKPHOS 63  BILITOT 0.9  PROT 8.6*  ALBUMIN 4.4   Recent Labs  Lab 10/07/19 0010  LIPASE 47   No results for input(s): AMMONIA in the last 168 hours. Coagulation Profile: No results for input(s): INR, PROTIME in the last  168 hours. Cardiac Enzymes: No results for input(s): CKTOTAL, CKMB, CKMBINDEX, TROPONINI in the last 168 hours. BNP (last 3 results) No results for input(s): PROBNP in the last 8760 hours. HbA1C: No results for input(s): HGBA1C in the last 72 hours. CBG: No results for input(s): GLUCAP in the last 168 hours. Lipid Profile: No results for input(s):  CHOL, HDL, LDLCALC, TRIG, CHOLHDL, LDLDIRECT in the last 72 hours. Thyroid Function Tests: No results for input(s): TSH, T4TOTAL, FREET4, T3FREE, THYROIDAB in the last 72 hours. Anemia Panel: No results for input(s): VITAMINB12, FOLATE, FERRITIN, TIBC, IRON, RETICCTPCT in the last 72 hours. Urine analysis:    Component Value Date/Time   COLORURINE YELLOW (A) 07/20/2017 0237   APPEARANCEUR CLEAR (A) 07/20/2017 0237   LABSPEC 1.015 07/20/2017 0237   PHURINE 5.5 07/20/2017 0237   GLUCOSEU NEGATIVE 07/20/2017 0237   HGBUR NEGATIVE 07/20/2017 0237   BILIRUBINUR NEGATIVE 07/20/2017 0237   KETONESUR NEGATIVE 07/20/2017 0237   PROTEINUR NEGATIVE 07/20/2017 0237   UROBILINOGEN 0.2 11/21/2014 1835   NITRITE NEGATIVE 07/20/2017 0237   LEUKOCYTESUR TRACE (A) 07/20/2017 0237    Radiological Exams on Admission: DG Chest Portable 1 View  Result Date: 10/07/2019 CLINICAL DATA:  COVID positive EXAM: PORTABLE CHEST 1 VIEW COMPARISON:  None. FINDINGS: The heart size and mediastinal contours are within normal limits. Both lungs are clear. The visualized skeletal structures are unremarkable. IMPRESSION: No active disease. Electronically Signed   By: Jonna Clark M.D.   On: 10/07/2019 02:25    EKG: Independently reviewed. Interpretation : Normal sinus rhythm with no acute ST-T wave changes  Assessment/Plan 69 year old female with history of DM, HTN, HLD who received Covid vaccine with Pfizer x2 who presents with cough and shortness of breath testing positive for Covid on August 3.  No improvement with outpatient monoclonal antibody  Dyspnea on exertion   Breakthrough COVID-19 infection -No evidence of pneumonia.  Procalcitonin less than 0.1.  No hypoxia -Follow CTA chest to evaluate for PE -Some suspicion for myocarditis in view of elevated troponin -Continue remdesivir for now given shortness of breath. -Continue to trend inflammatory biomarkers -Albuterol, antitussives, vitamins -    Elevated  troponin -Uncertain etiology, myocarditis versus ACS versus demand ischemia from shortness of breath -Troponin already trending down to 294/270 -Prophylactic dose Lovenox    Diabetes mellitus without complication (HCC) -Sliding scale insulin coverage    Essential hypertension -Continue home antihypertensives    DVT prophylaxis: Lovenox  Code Status: full code  Family Communication:  none  Disposition Plan: Back to previous home environment Consults called: none  Status: Observation    Andris Baumann MD [facility]     10/07/2019, 4:11 AM

## 2019-10-07 NOTE — ED Provider Notes (Signed)
[facility] Emergency Department Provider Note  ____________________________________________  Time seen: Approximately 2:01 AM  I have reviewed the triage vital signs and the nursing notes.   HISTORY  Chief Complaint No chief complaint on file.   HPI Jodi Chavez is a 69 y.o. female with a history of diabetes, hypertension, hyperlipidemia, chronic bronchitis who presents for evaluation of shortness of breath.   Patient reports that her sister who lives with her got diagnosed with Covid.  A week ago she started having symptoms and tested positive on August 3.  The following day she received infusion of monoclonal antibodies.  She was fully vaccinated with [employer].  She is complaining of persistent shortness of breath which is mild at rest but moderate to severe with ambulation.  She denies chest pain, fever, vomiting, abdominal pain.  She has had some mild diarrhea and a dry cough.  She denies any personal or family history of blood clots, recent travel immobilization, leg pain or swelling, hemoptysis, or exogenous hormones.  She denies any history of cardiac disease.  Past Medical History:  Diagnosis Date  . Arthritis    hip  . Constipation    uses laxatives several times a week  . Depression   . Diabetes mellitus    takes Metformin and Amaryl daily  . Dizziness    occasionally and related to meds   . Early cataracts, bilateral   . History of bronchitis    last time several yrs ago  . History of migraine    many yrs ago  . Hyperlipidemia    takes Zetia and Zocor daily  . Hypertension    takes Benazepril nightly and Propranolol tid and Clonidine daily  . Insomnia   . Low back pain   . Peripheral edema   . Peripheral neuropathy   . PONV (postoperative nausea and vomiting)   . Slow urinary stream    occasionally    Patient Active Problem List   Diagnosis Date Noted  . Breakthrough COVID-19 10/07/2019  . Elevated troponin 10/07/2019  . HLD  (hyperlipidemia) 11/21/2014  . Depression 11/21/2014  . Nausea & vomiting 11/21/2014  . Sepsis (HCC) 11/21/2014  . Coughing 11/21/2014  . UTI (lower urinary tract infection) 11/21/2014  . AKI (acute kidney injury) (HCC) 11/21/2014  . Nausea with vomiting   . 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 HNP (herniated nucleus pulposus), cervical 10/15/2011    Class: Chronic  . Diabetes mellitus without complication (HCC) 09/30/2006  . Essential hypertension 09/30/2006  . DILATION AND CURETTAGE, HX OF 09/30/2006    Past Surgical History:  Procedure Laterality Date  . COLONOSCOPY    . d&c/hysteroscopy/ablation    . DILATION AND CURETTAGE OF UTERUS     couple of times  . ESOPHAGOGASTRODUODENOSCOPY    . 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 POSTERIOR CERVICAL FUSION/FORAMINOTOMY  10/15/2011   Procedure: POSTERIOR CERVICAL FUSION/FORAMINOTOMY LEVEL 2;  Surgeon: Kerrin Champagne, MD;  Location: [facility] OR;  Service: Orthopedics;  Laterality: N/A;  Right C6-7, C7-T1 Foraminotomy with excision HNP C7-T1  . UPPER GASTROINTESTINAL ENDOSCOPY      Prior to Admission medications   Medication Sig Start Date End Date Taking? Authorizing Provider  acetaminophen (TYLENOL) 500 MG tablet Take 500-1,000 mg by mouth 3 (three) times daily. 1000 mg every morning, 500 mg every afternoon, and 1000 mg every night    [provider]  benazepril (LOTENSIN)  40 MG tablet Take 40 mg by mouth every evening.     [provider]  calcium-vitamin D (OSCAL WITH D) 500-200 MG-UNIT per tablet Take 1 tablet by mouth daily after breakfast.     [provider]  cetirizine (ZYRTEC) 10 MG tablet Take 10 mg by mouth daily as needed. 01/02/18   [provider]  cloNIDine (CATAPRES) 0.2 MG tablet Take 0.2 mg by mouth 2 (two) times daily.     [provider]  diphenhydrAMINE (BENADRYL) 25 MG tablet Take 1 tablet (25 mg total) by mouth every  6 (six) hours as needed for itching. 11/25/14   Rai, Ripudeep K, MD  doxepin (SINEQUAN) 50 MG capsule Take 100 mg by mouth at bedtime.    [provider]  ezetimibe (ZETIA) 10 MG tablet Take 10 mg by mouth daily after breakfast.     [provider]  fluticasone (FLONASE) 50 MCG/ACT nasal spray Place 2 sprays into both nostrils daily.  06/11/14   [provider]  furosemide (LASIX) 20 MG tablet TAKE 1 TABLET BY MOUTH ONCE DAILY AS NEEDED FOR LEG SWELLING 03/02/18   [provider]  gabapentin (NEURONTIN) 300 MG capsule Take 300 mg by mouth 3 (three) times daily.    [provider]  Garlic 1000 MG CAPS Take 5,000 mg by mouth daily after breakfast.     [provider]  glimepiride (AMARYL) 2 MG tablet Take 2 mg by mouth daily before breakfast.    [provider]  Glucosamine-Chondroit-Vit C-Mn (GLUCOSAMINE CHONDR 1500 COMPLX PO) Take 1 tablet by mouth daily after breakfast.     [provider]  hydrochlorothiazide (HYDRODIURIL) 25 MG tablet Take 25 mg by mouth daily after breakfast.     [provider]  loratadine (CLARITIN) 10 MG tablet Take 10 mg by mouth daily after breakfast. Reported on 07/12/2015    [provider]  metFORMIN (GLUCOPHAGE) 850 MG tablet Take 850 mg by mouth daily with breakfast.    [provider]  Multiple Vitamin (MULITIVITAMIN WITH MINERALS) TABS Take 1 tablet by mouth daily after breakfast.     [provider]  nabumetone (RELAFEN) 500 MG tablet TAKE 1 TABLET BY MOUTH 2-3 TIMES DAILY AS NEEDED FOR INFLAMMATION 10/31/17   Kathryne HitchBlackman, Christopher Y, MD  OVER THE COUNTER MEDICATION Apply 1 application topically daily. sween cream applied to groin area daily to prevent rash    [provider]  pantoprazole (PROTONIX) 40 MG tablet Take 1 tablet (40 mg total) by mouth daily. 11/25/14   Rai, Delene Ruffiniipudeep K, MD  polyethylene glycol (MIRALAX / GLYCOLAX) packet Take 17 g by mouth  daily. Patient taking differently: Take 17 g by mouth daily as needed for moderate constipation.  11/25/14   Rai, Ripudeep Kirtland BouchardK, MD  PROAIR HFA 108 (90 BASE) MCG/ACT inhaler Inhale 2 puffs into the lungs every 4 (four) hours as needed for wheezing or shortness of breath.  06/11/14   [provider]  promethazine (PHENERGAN) 25 MG tablet Take 1 tablet (25 mg total) by mouth every 6 (six) hours as needed for nausea. 07/20/17   Molpus, John, MD  propranolol (INDERAL) 20 MG tablet Take 20 mg by mouth 3 (three) times daily.  08/15/16   [provider]  Sennosides 25 MG TABS Take 2 tablets by mouth daily as needed (constipation). Pt takes every other day to every 3 days     [provider]  simvastatin (ZOCOR) 40 MG tablet Take 40 mg  by mouth every evening.    [provider]  traMADol (ULTRAM) 50 MG tablet TAKE ONE-HALF TO ONE TABLET BY MOUTH EVERY 4 TO 6 HOURS AS NEEDED FOR PAIN 04/17/16   Kerrin Champagne, MD  UNABLE TO FIND Take by mouth.    [provider]  UNABLE TO FIND Take by mouth.    [provider]  DOXEPIN HCL PO Take by mouth.    05/10/11  [provider]  GABAPENTIN, PHN, PO Take by mouth.    05/10/11  [provider]    Allergies Naproxen sodium  Family History  Problem Relation Age of Onset  . Cancer Mother   . Stroke Mother   . Diabetes Mother   . Hypertension Mother   . Colon cancer Mother 94       died 06/22/2005  . Gout Father   . Heart disease Father     Social History Social History   Tobacco Use  . Smoking status: Never Smoker  . Smokeless tobacco: Never Used  Substance Use Topics  . Alcohol use: No  . Drug use: No    Review of Systems  Constitutional: Negative for fever. Eyes: Negative for visual changes. ENT: Negative for sore throat. Neck: No neck pain  Cardiovascular: Negative for chest pain. Respiratory: + shortness of breath and cough Gastrointestinal: Negative for abdominal pain, vomiting. +  diarrhea. Genitourinary: Negative for dysuria. Musculoskeletal: Negative for back pain. Skin: Negative for rash. Neurological: Negative for headaches, weakness or numbness. Psych: No SI or HI  ____________________________________________   PHYSICAL EXAM:  VITAL SIGNS: ED Triage Vitals  Enc Vitals Group     BP 10/07/19 0005 (!) 199/100     Pulse Rate 10/07/19 0005 94     Resp 10/07/19 0005 18     Temp 10/07/19 0005 98.6 F (37 C)     Temp Source 10/07/19 0005 Oral     SpO2 10/07/19 0005 95 %     Weight 10/07/19 0006 206 lb (93.4 kg)     Height 10/07/19 0006 5\' 1"  (1.549 m)     Head Circumference --      Peak Flow --      Pain Score 10/07/19 0006 8     Pain Loc --      Pain Edu? --      Excl. in GC? --     Constitutional: Alert and oriented. Well appearing and in no apparent distress. HEENT:      Head: Normocephalic and atraumatic.         Eyes: Conjunctivae are normal. Sclera is non-icteric.       Mouth/Throat: Mucous membranes are moist.       Neck: Supple with no signs of meningismus. Cardiovascular: Regular rate and rhythm. No murmurs, gallops, or rubs.  Respiratory: Normal respiratory effort. Lungs are clear to auscultation bilaterally. No wheezes, crackles, or rhonchi.  Gastrointestinal: Soft, non tender. Musculoskeletal: No edema, cyanosis, or erythema of extremities. Neurologic: Normal speech and language. Face is symmetric. Moving all extremities. No gross focal neurologic deficits are appreciated. Skin: Skin is warm, dry and intact. No rash noted. Psychiatric: Mood and affect are normal. Speech and behavior are normal.  ____________________________________________   LABS (all labs ordered are listed, but only abnormal results are displayed)  Labs Reviewed  CBC WITH DIFFERENTIAL/PLATELET - Abnormal; Notable for the following components:      Result Value   WBC 16.4 (*)    Hemoglobin 16.4 (*)    HCT  46.9 (*)    Neutro Abs 14.7 (*)    Abs Immature  Granulocytes 0.18 (*)    All other components within normal limits  COMPREHENSIVE METABOLIC PANEL - Abnormal; Notable for the following components:   Chloride 95 (*)    Glucose, Bld 292 (*)    BUN 31 (*)    Creatinine, Ser 1.26 (*)    Total Protein 8.6 (*)    AST 49 (*)    GFR calc non Af Amer 44 (*)    GFR calc Af Amer 51 (*)    Anion gap 16 (*)    All other components within normal limits  BRAIN NATRIURETIC PEPTIDE - Abnormal; Notable for the following components:   B Natriuretic Peptide 139.6 (*)    All other components within normal limits  FIBRIN DERIVATIVES D-DIMER ([facility] ONLY) - Abnormal; Notable for the following components:   Fibrin derivatives D-dimer [facility] 1,449.85 (*)    All other components within normal limits  BLOOD GAS, VENOUS - Abnormal; Notable for the following components:   pH, Ven 7.48 (*)    pCO2, Ven 39 (*)    pO2, Ven 59.0 (*)    Bicarbonate 29.0 (*)    Acid-Base Excess 5.2 (*)    All other components within normal limits  TROPONIN I (HIGH SENSITIVITY) - Abnormal; Notable for the following components:   Troponin I (High Sensitivity) 294 (*)    All other components within normal limits  TROPONIN I (HIGH SENSITIVITY) - Abnormal; Notable for the following components:   Troponin I (High Sensitivity) 270 (*)    All other components within normal limits  LIPASE, BLOOD  PROCALCITONIN  URINALYSIS, COMPLETE (UACMP) WITH MICROSCOPIC  HIV ANTIBODY (ROUTINE TESTING W REFLEX)  ABO/RH   ____________________________________________  EKG  ED ECG REPORT I, Nita Sickle, the attending physician, personally viewed and interpreted this ECG.  Normal sinus rhythm, rate of ninety-eight, normal intervals, normal axis, inferior, anterior and lateral Q waves with no ST elevation.  Lateral Q waves are new when compared to prior. ____________________________________________  RADIOLOGY  I have personally reviewed the images performed during this visit and I agree  with the Radiologist's read.   Interpretation by Radiologist:  CT Angio Chest PE W and/or Wo Contrast  Result Date: 10/07/2019 CLINICAL DATA:  PE suspected.  High probability.  COVID positive. EXAM: CT ANGIOGRAPHY CHEST WITH CONTRAST TECHNIQUE: Multidetector CT imaging of the chest was performed using the standard protocol during bolus administration of intravenous contrast. Multiplanar CT image reconstructions and MIPs were obtained to evaluate the vascular anatomy. CONTRAST:  42mL OMNIPAQUE IOHEXOL 350 MG/ML SOLN COMPARISON:  One-view chest x-ray 10/07/2019 FINDINGS: Cardiovascular: Heart size is normal. Extensive coronary artery calcifications are present. Atherosclerotic changes are present in the aorta and at the origins the great vessels without significant stenosis. No aneurysm is present. Pulmonary artery opacification is excellent. No focal filling defects are present to suggest pulmonary embolus. Mediastinum/Nodes: No significant mediastinal, hilar, or axillary adenopathy is present. Thoracic inlet is within normal limits. Esophagus is normal. Lungs/Pleura: Patchy airspace opacities are present in the lower lobes bilaterally as well as the lingula. No significant consolidation is present. No significant pleural disease is present. Upper Abdomen: Hyperdense gallstones are present. No inflammatory changes are present in the gallbladder. Visualized liver is within normal limits. Upper abdomen is otherwise unremarkable. Musculoskeletal: Multilevel degenerative changes are present in the thoracic spine. Vacuum disc is present at T12-L1. Vertebral body heights are maintained. No acute or healing fractures are  present. Ribs are unremarkable. No focal lytic or blastic lesions are present. Review of the MIP images confirms the above findings. IMPRESSION: 1. No evidence for pulmonary embolus. 2. Patchy airspace opacities in the lower lobes bilaterally as well as the lingula compatible with multifocal airspace  disease, likely viral pneumonia in the setting of COVID-19 infection. 3. Coronary artery disease. 4. Cholelithiasis without evidence for cholecystitis. 5. Aortic Atherosclerosis (ICD10-I70.0). Electronically Signed   By: Marin Roberts M.D.   On: 10/07/2019 04:16   DG Chest Portable 1 View  Result Date: 10/07/2019 CLINICAL DATA:  COVID positive EXAM: PORTABLE CHEST 1 VIEW COMPARISON:  None. FINDINGS: The heart size and mediastinal contours are within normal limits. Both lungs are clear. The visualized skeletal structures are unremarkable. IMPRESSION: No active disease. Electronically Signed   By: Jonna Clark M.D.   On: 10/07/2019 02:25      ____________________________________________   PROCEDURES  Procedure(s) performed:yes .1-3 Lead EKG Interpretation Performed by: Nita Sickle, MD Authorized by: Nita Sickle, MD     Interpretation: non-specific     ECG rate assessment: normal     Rhythm: sinus rhythm     Ectopy: none     Critical Care performed: yes  CRITICAL CARE Performed by: Nita Sickle  ?  Total critical care time: 40 min  Critical care time was exclusive of separately billable procedures and treating other patients.  Critical care was necessary to treat or prevent imminent or life-threatening deterioration.  Critical care was time spent personally by me on the following activities: development of treatment plan with patient and/or surrogate as well as nursing, discussions with consultants, evaluation of patient's response to treatment, examination of patient, obtaining history from patient or surrogate, ordering and performing treatments and interventions, ordering and review of laboratory studies, ordering and review of radiographic studies, pulse oximetry and re-evaluation of patient's condition.  ____________________________________________   INITIAL IMPRESSION / ASSESSMENT AND PLAN / ED COURSE   69 y.o. female with a history of  diabetes, hypertension, hyperlipidemia, chronic bronchitis who presents for evaluation of shortness of breath after being diagnosed with Covid a week ago.  Patient is fully immunized and did receive one round of monoclonal antibody infusion after diagnosis.  She has normal work of breathing and normal sats both at rest and with ambulation, her lungs are clear to auscultation, she looks euvolemic.  EKG shows no acute ischemic changes.  BP is elevated but she has not taken her evening antihypertensive medications which will be given to her.  Her troponin is elevated at two ninety-four which raises suspicion for either heart strain versus myocarditis versus PE. D-dimer is pending. Procalcitonin pending. CXR pending.   _________________________ 4:22 AM on 10/07/2019 ----------------------------------------- CTA negative for PE only showing covid findings in the lungs. Repeat troponin again elevated concerning for possible Covid Myocarditis. Discussed with Dr. Para March who recommended Remdesivir.  She also recommended prophylactic lovenox which she will order with admission orders.      _____________________________________________ Please note:  Patient was evaluated in Emergency Department today for the symptoms described in the history of present illness. Patient was evaluated in the context of the global COVID-19 pandemic, which necessitated consideration that the patient might be at risk for infection with the SARS-CoV-2 virus that causes COVID-19. Institutional protocols and algorithms that pertain to the evaluation of patients at risk for COVID-19 are in a state of rapid change based on information released by regulatory bodies including the CDC and federal and state organizations. These policies  and algorithms were followed during the patient's care in the ED.  Some ED evaluations and interventions may be delayed as a result of limited staffing during the pandemic.   [place] Controlled Substance Database  was reviewed by me. 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    Don Perking, [place], MD 10/07/19 [phone]

## 2019-10-08 DIAGNOSIS — I2510 Atherosclerotic heart disease of native coronary artery without angina pectoris: Secondary | ICD-10-CM | POA: Diagnosis present

## 2019-10-08 DIAGNOSIS — Z833 Family history of diabetes mellitus: Secondary | ICD-10-CM | POA: Diagnosis not present

## 2019-10-08 DIAGNOSIS — E785 Hyperlipidemia, unspecified: Secondary | ICD-10-CM | POA: Diagnosis present

## 2019-10-08 DIAGNOSIS — Z823 Family history of stroke: Secondary | ICD-10-CM | POA: Diagnosis not present

## 2019-10-08 DIAGNOSIS — E1122 Type 2 diabetes mellitus with diabetic chronic kidney disease: Secondary | ICD-10-CM | POA: Diagnosis present

## 2019-10-08 DIAGNOSIS — E1142 Type 2 diabetes mellitus with diabetic polyneuropathy: Secondary | ICD-10-CM | POA: Diagnosis present

## 2019-10-08 DIAGNOSIS — R0902 Hypoxemia: Secondary | ICD-10-CM | POA: Diagnosis present

## 2019-10-08 DIAGNOSIS — Z7984 Long term (current) use of oral hypoglycemic drugs: Secondary | ICD-10-CM | POA: Diagnosis not present

## 2019-10-08 DIAGNOSIS — U071 COVID-19: Secondary | ICD-10-CM | POA: Diagnosis present

## 2019-10-08 DIAGNOSIS — Z800 Family history of malignant neoplasm of digestive organs: Secondary | ICD-10-CM | POA: Diagnosis not present

## 2019-10-08 DIAGNOSIS — N182 Chronic kidney disease, stage 2 (mild): Secondary | ICD-10-CM | POA: Diagnosis present

## 2019-10-08 DIAGNOSIS — T380X5A Adverse effect of glucocorticoids and synthetic analogues, initial encounter: Secondary | ICD-10-CM | POA: Diagnosis present

## 2019-10-08 DIAGNOSIS — R778 Other specified abnormalities of plasma proteins: Secondary | ICD-10-CM | POA: Diagnosis present

## 2019-10-08 DIAGNOSIS — Z981 Arthrodesis status: Secondary | ICD-10-CM | POA: Diagnosis not present

## 2019-10-08 DIAGNOSIS — R110 Nausea: Secondary | ICD-10-CM | POA: Diagnosis present

## 2019-10-08 DIAGNOSIS — Z79899 Other long term (current) drug therapy: Secondary | ICD-10-CM | POA: Diagnosis not present

## 2019-10-08 DIAGNOSIS — I129 Hypertensive chronic kidney disease with stage 1 through stage 4 chronic kidney disease, or unspecified chronic kidney disease: Secondary | ICD-10-CM | POA: Diagnosis present

## 2019-10-08 DIAGNOSIS — R197 Diarrhea, unspecified: Secondary | ICD-10-CM | POA: Diagnosis present

## 2019-10-08 DIAGNOSIS — Z8249 Family history of ischemic heart disease and other diseases of the circulatory system: Secondary | ICD-10-CM | POA: Diagnosis not present

## 2019-10-08 DIAGNOSIS — J1282 Pneumonia due to coronavirus disease 2019: Secondary | ICD-10-CM | POA: Diagnosis present

## 2019-10-08 LAB — CBC WITH DIFFERENTIAL/PLATELET
Abs Immature Granulocytes: 0.27 10*3/uL — ABNORMAL HIGH (ref 0.00–0.07)
Basophils Absolute: 0 10*3/uL (ref 0.0–0.1)
Basophils Relative: 0 %
Eosinophils Absolute: 0 10*3/uL (ref 0.0–0.5)
Eosinophils Relative: 0 %
HCT: 40.4 % (ref 36.0–46.0)
Hemoglobin: 14.5 g/dL (ref 12.0–15.0)
Immature Granulocytes: 2 %
Lymphocytes Relative: 12 %
Lymphs Abs: 1.5 10*3/uL (ref 0.7–4.0)
MCH: 32.9 pg (ref 26.0–34.0)
MCHC: 35.9 g/dL (ref 30.0–36.0)
MCV: 91.6 fL (ref 80.0–100.0)
Monocytes Absolute: 0.8 10*3/uL (ref 0.1–1.0)
Monocytes Relative: 6 %
Neutro Abs: 10.8 10*3/uL — ABNORMAL HIGH (ref 1.7–7.7)
Neutrophils Relative %: 80 %
Platelets: 263 10*3/uL (ref 150–400)
RBC: 4.41 MIL/uL (ref 3.87–5.11)
RDW: 11.9 % (ref 11.5–15.5)
WBC: 13.4 10*3/uL — ABNORMAL HIGH (ref 4.0–10.5)
nRBC: 0 % (ref 0.0–0.2)

## 2019-10-08 LAB — COMPREHENSIVE METABOLIC PANEL
ALT: 30 U/L (ref 0–44)
AST: 23 U/L (ref 15–41)
Albumin: 3.7 g/dL (ref 3.5–5.0)
Alkaline Phosphatase: 49 U/L (ref 38–126)
Anion gap: 12 (ref 5–15)
BUN: 26 mg/dL — ABNORMAL HIGH (ref 8–23)
CO2: 28 mmol/L (ref 22–32)
Calcium: 9.5 mg/dL (ref 8.9–10.3)
Chloride: 96 mmol/L — ABNORMAL LOW (ref 98–111)
Creatinine, Ser: 0.84 mg/dL (ref 0.44–1.00)
GFR calc Af Amer: >60 mL/min (ref >60)
GFR calc non Af Amer: >60 mL/min (ref >60)
Glucose, Bld: 168 mg/dL — ABNORMAL HIGH (ref 70–99)
Potassium: 3.9 mmol/L (ref 3.5–5.1)
Sodium: 136 mmol/L (ref 135–145)
Total Bilirubin: 0.8 mg/dL (ref 0.3–1.2)
Total Protein: 7.1 g/dL (ref 6.5–8.1)

## 2019-10-08 LAB — ECHOCARDIOGRAM COMPLETE
AR max vel: 2.25 cm2
AV Area VTI: 2.55 cm2
AV Area mean vel: 2.18 cm2
AV Mean grad: 2 mmHg
AV Peak grad: 2.9 mmHg
Ao pk vel: 0.85 m/s
Area-P 1/2: 2.87 cm2
Height: 61 in
S' Lateral: 1.69 cm
Weight: 3296 oz

## 2019-10-08 LAB — MAGNESIUM: Magnesium: 1.1 mg/dL — ABNORMAL LOW (ref 1.7–2.4)

## 2019-10-08 LAB — C-REACTIVE PROTEIN: CRP: 0.6 mg/dL (ref <1.0)

## 2019-10-08 LAB — FIBRIN DERIVATIVES D-DIMER (ARMC ONLY): Fibrin derivatives D-dimer (ARMC): 837.84 ng/mL (FEU) — ABNORMAL HIGH (ref 0.00–499.00)

## 2019-10-08 LAB — GLUCOSE, CAPILLARY
Glucose-Capillary: 153 mg/dL — ABNORMAL HIGH (ref 70–99)
Glucose-Capillary: 218 mg/dL — ABNORMAL HIGH (ref 70–99)
Glucose-Capillary: 246 mg/dL — ABNORMAL HIGH (ref 70–99)
Glucose-Capillary: 287 mg/dL — ABNORMAL HIGH (ref 70–99)

## 2019-10-08 LAB — FERRITIN: Ferritin: 206 ng/mL (ref 11–307)

## 2019-10-08 LAB — PHOSPHORUS: Phosphorus: 4.1 mg/dL (ref 2.5–4.6)

## 2019-10-08 MED ORDER — MAGNESIUM OXIDE 400 (241.3 MG) MG PO TABS
200.0000 mg | ORAL_TABLET | Freq: Two times a day (BID) | ORAL | Status: DC
  Administered 2019-10-08 – 2019-10-12 (×9): 200 mg via ORAL
  Filled 2019-10-08 (×9): qty 1

## 2019-10-08 MED ORDER — MAGNESIUM SULFATE 2 GM/50ML IV SOLN
2.0000 g | Freq: Once | INTRAVENOUS | Status: AC
  Administered 2019-10-08: 09:00:00 2 g via INTRAVENOUS
  Filled 2019-10-08: qty 50

## 2019-10-08 NOTE — Progress Notes (Signed)
[facility]  Patient: Jodi Chavez / Admit Date: 10/07/2019 / Date of Encounter: 10/08/2019, 9:18 AM   Subjective: Overall patient significantly improved from admission with less hypoxia and less shortness of breath but still somewhat concerned.  Chest x-ray still normal with mild chronic kidney disease with a glomerular filtration rate of 51.  Troponin peaked at 294 possibly consistent with demand ischemia but no current evidence of significant concerns of acute coronary syndrome and no evidence of true myocarditis. Echocardiogram showing normal LV systolic function with no evidence of abnormalities with normal valve function with ejection fraction of 55%  Review of Systems: Positive for: Shortness of breath cough congestion Negative for: Vision change, hearing change, syncope, dizziness, nausea, vomiting,diarrhea, bloody stool, stomach pain, positive for cough, congestion, negative for diaphoresis, urinary frequency, urinary pain,skin lesions, skin rashes Others previously listed  Objective: Telemetry: Normal sinus rhythm Physical Exam: Blood pressure (!) 191/106, pulse 74, temperature 98.6 F (37 C), resp. rate 14, height 5\' 1"  (1.549 m), weight 93.4 kg, SpO2 98 %. Body mass index is 38.92 kg/m. As per prime doc  No intake or output data in the 24 hours ending 10/08/19 0918  Inpatient Medications:  . albuterol  2 puff Inhalation Q6H  . vitamin C  500 mg Oral Daily  . benazepril  40 mg Oral QPM  . cloNIDine  0.2 mg Oral BID  . enoxaparin (LOVENOX) injection  40 mg Subcutaneous Q24H  . ezetimibe  10 mg Oral QPC breakfast  . hydrochlorothiazide  25 mg Oral QPC breakfast  . insulin aspart  0-15 Units Subcutaneous TID WC  . insulin aspart  0-5 Units Subcutaneous QHS  . magnesium oxide  200 mg Oral BID  . multivitamin with minerals  1 tablet Oral Daily  . predniSONE  40 mg Oral Daily  . propranolol  20 mg Oral TID  . simvastatin  40 mg Oral QPM   . zinc sulfate  220 mg Oral Daily   Infusions:  . magnesium sulfate bolus IVPB 2 g (10/08/19 0914)  . remdesivir 100 mg in NS 100 mL      Labs: Recent Labs    10/07/19 0010 10/08/19 0539  NA 138 136  K 3.9 3.9  CL 95* 96*  CO2 27 28  GLUCOSE 292* 168*  BUN 31* 26*  CREATININE 1.26* 0.84  CALCIUM 9.5 9.5  MG  --  1.1*  PHOS  --  4.1   Recent Labs    10/07/19 0010 10/08/19 0539  AST 49* 23  ALT 37 30  ALKPHOS 63 49  BILITOT 0.9 0.8  PROT 8.6* 7.1  ALBUMIN 4.4 3.7   Recent Labs    10/07/19 0010 10/08/19 0539  WBC 16.4* 13.4*  NEUTROABS 14.7* 10.8*  HGB 16.4* 14.5  HCT 46.9* 40.4  MCV 92.5 91.6  PLT 320 263   No results for input(s): CKTOTAL, CKMB, TROPONINI in the last 72 hours. Invalid input(s): POCBNP No results for input(s): HGBA1C in the last 72 hours.   Weights: Filed Weights   10/07/19 0006  Weight: 93.4 kg     Radiology/Studies:  CT Angio Chest PE W and/or Wo Contrast  Result Date: 10/07/2019 CLINICAL DATA:  PE suspected.  High probability.  COVID positive. EXAM: CT ANGIOGRAPHY CHEST WITH CONTRAST TECHNIQUE: Multidetector CT imaging of the chest was performed using the standard protocol during bolus administration of intravenous contrast. Multiplanar CT image reconstructions and MIPs were obtained to evaluate the vascular anatomy. CONTRAST:  88mL OMNIPAQUE IOHEXOL 350 MG/ML SOLN COMPARISON:  One-view chest x-ray 10/07/2019 FINDINGS: Cardiovascular: Heart size is normal. Extensive coronary artery calcifications are present. Atherosclerotic changes are present in the aorta and at the origins the great vessels without significant stenosis. No aneurysm is present. Pulmonary artery opacification is excellent. No focal filling defects are present to suggest pulmonary embolus. Mediastinum/Nodes: No significant mediastinal, hilar, or axillary adenopathy is present. Thoracic inlet is within normal limits. Esophagus is normal. Lungs/Pleura: Patchy airspace  opacities are present in the lower lobes bilaterally as well as the lingula. No significant consolidation is present. No significant pleural disease is present. Upper Abdomen: Hyperdense gallstones are present. No inflammatory changes are present in the gallbladder. Visualized liver is within normal limits. Upper abdomen is otherwise unremarkable. Musculoskeletal: Multilevel degenerative changes are present in the thoracic spine. Vacuum disc is present at T12-L1. Vertebral body heights are maintained. No acute or healing fractures are present. Ribs are unremarkable. No focal lytic or blastic lesions are present. Review of the MIP images confirms the above findings. IMPRESSION: 1. No evidence for pulmonary embolus. 2. Patchy airspace opacities in the lower lobes bilaterally as well as the lingula compatible with multifocal airspace disease, likely viral pneumonia in the setting of COVID-19 infection. 3. Coronary artery disease. 4. Cholelithiasis without evidence for cholecystitis. 5. Aortic Atherosclerosis (ICD10-I70.0). Electronically Signed   By: Marin Roberts M.D.   On: 10/07/2019 04:16   DG Chest Portable 1 View  Result Date: 10/07/2019 CLINICAL DATA:  COVID positive EXAM: PORTABLE CHEST 1 VIEW COMPARISON:  None. FINDINGS: The heart size and mediastinal contours are within normal limits. Both lungs are clear. The visualized skeletal structures are unremarkable. IMPRESSION: No active disease. Electronically Signed   By: Jonna Clark M.D.   On: 10/07/2019 02:25   ECHOCARDIOGRAM COMPLETE  Result Date: 10/08/2019    ECHOCARDIOGRAM REPORT   Patient Name:   Jodi Chavez Date of Exam: 10/07/2019 Medical Rec #:  [id number]      Height:       61.0 in Accession #:    [id number]     Weight:       206.0 lb Date of Birth:  [birth year]      BSA:          1.913 m Patient Age:    69 years       BP:           162/81 mmHg Patient Gender: F              HR:           82 bpm. Exam Location:  [facility] Procedure: 2D Echo,  Cardiac Doppler and Color Doppler Indications:     Elevated troponin  History:         Patient has no prior history of Echocardiogram examinations.                  Risk Factors:Hypertension, Diabetes and Dyslipidemia.  Sonographer:     Cristela Blue RDCS (AE) Referring Phys:  [id number] Alba Cory Diagnosing Phys: Arnoldo Hooker MD  Sonographer Comments: Technically challenging study due to limited acoustic windows and suboptimal apical window. IMPRESSIONS  1. Left ventricular ejection fraction, by estimation, is 50 to 55%. The left ventricle has low normal function. The left ventricle has no regional wall motion abnormalities. Left ventricular diastolic parameters were normal.  2. Right ventricular systolic function is normal. The right ventricular size is normal. There is normal pulmonary artery systolic pressure.  3. The mitral valve is normal in structure. Mild mitral valve regurgitation.  4. The aortic valve is normal in structure. Aortic valve regurgitation is not visualized. FINDINGS  Left Ventricle: Left ventricular ejection fraction, by estimation, is 50 to 55%. The left ventricle has low normal function. The left ventricle has no regional wall motion abnormalities. The left ventricular internal cavity size was normal in size. There is no left ventricular hypertrophy. Left ventricular diastolic parameters were normal. Right Ventricle: The right ventricular size is normal. No increase in right ventricular wall thickness. Right ventricular systolic function is normal. There is normal pulmonary artery systolic pressure. The tricuspid regurgitant velocity is 1.36 m/s, and  with an assumed right atrial pressure of 10 mmHg, the estimated right ventricular systolic pressure is 17.4 mmHg. Left Atrium: Left atrial size was normal in size. Right Atrium: Right atrial size was normal in size. Pericardium: There is no evidence of pericardial effusion. Mitral Valve: The mitral valve is normal in structure. Mild mitral  valve regurgitation. Tricuspid Valve: The tricuspid valve is normal in structure. Tricuspid valve regurgitation is trivial. Aortic Valve: The aortic valve is normal in structure. Aortic valve regurgitation is not visualized. Aortic valve mean gradient measures 2.0 mmHg. Aortic valve peak gradient measures 2.9 mmHg. Aortic valve area, by VTI measures 2.55 cm. Pulmonic Valve: The pulmonic valve was normal in structure. Pulmonic valve regurgitation is not visualized. Aorta: The aortic root and ascending aorta are structurally normal, with no evidence of dilitation. IAS/Shunts: No atrial level shunt detected by color flow Doppler.  LEFT VENTRICLE PLAX 2D LVIDd:         2.63 cm  Diastology LVIDs:         1.69 cm  LV e' lateral:   5.44 cm/s LV PW:         1.36 cm  LV E/e' lateral: 0.0 LV IVS:        1.02 cm  LV e' medial:    4.90 cm/s LVOT diam:     1.90 cm  LV E/e' medial:  0.0 LV SV:         56 LV SV Index:   29 LVOT Area:     2.84 cm  RIGHT VENTRICLE RV S prime:     14.40 cm/s LEFT ATRIUM         Index LA diam:    3.80 cm 1.99 cm/m  AORTIC VALVE                   PULMONIC VALVE AV Area (Vmax):    2.25 cm    PV Vmax:        0.84 m/s AV Area (Vmean):   2.18 cm    PV Peak grad:   2.8 mmHg AV Area (VTI):     2.55 cm    RVOT Peak grad: 4 mmHg AV Vmax:           84.60 cm/s AV Vmean:          63.500 cm/s AV VTI:            0.219 m AV Peak Grad:      2.9 mmHg AV Mean Grad:      2.0 mmHg LVOT Vmax:         67.20 cm/s LVOT Vmean:        48.800 cm/s LVOT VTI:          0.197 m LVOT/AV VTI ratio: 0.90  AORTA Ao Root diam: 2.20 cm MITRAL VALVE  TRICUSPID VALVE MV Area (PHT): 2.87 cm     TR Peak grad:   7.4 mmHg MV Decel Time: 264 msec     TR Vmax:        136.00 cm/s MV E velocity: 0.00 cm/s MV A velocity: 130.00 cm/s  SHUNTS MV E/A ratio:  0.00         Systemic VTI:  0.20 m                             Systemic Diam: 1.90 cm Arnoldo Hooker MD Electronically signed by Arnoldo Hooker MD Signature Date/Time:  10/08/2019/8:56:49 AM    Final      Assessment and Recommendation  70 y.o. female with known chronic kidney disease coronary artery disease by CAT scan hypertension diabetes with acute Covid infection with hypoxia and elevated troponin consistent with demand ischemia rather than acute coronary syndrome or true significant myocarditis at low risk for major cardiovascular complication with infection due to normal LV function by echocardiogram 1.  Continue supportive care for treatment of Covid infection and inflammation and hypoxia without restriction 2.  Continue hypertension control with current medical regimen including benazepril clonidine hydrochlorothiazide 3.  Lipid management with simvastatin without change at this time 4.  No further cardiac diagnostics necessary at this time 5.  Call if further questions from the cardiac standpoint but no current evidence of need for intervention and/or treatment changes.  If further questions needed Dr. Darrold Junker will be available this weekend  Signed, Arnoldo Hooker M.D. FACC

## 2019-10-08 NOTE — Progress Notes (Signed)
PROGRESS NOTE    Jodi Chavez  CHT:[id number] DOB: [birth year] DOA: 10/07/2019 PCP: Nolene Ebbs, MD   Brief Narrative: 69 year old with past medical history significant for diabetes, hypertension, hyperlipidemia, who received Covid vaccine varicella x2 presents with shortness of breath, cough.  Patient was tested positive for Covid on August 2.  Patient was able to see her PCP and she received a monoclonal antibody a few days prior to admission, but her symptoms continue to get worse for this reason she presented to the ED. Evaluation in the ED blood pressure elevated 199/100, troponins 290/270, D-dimer 1449.  Chest x-ray no acute disease. CTA: No evidence for pulmonary embolus. Patchy airspace opacities in the lower lobes bilaterally as well as the lingula compatible with multifocal airspace disease, likely viral pneumonia in the setting of COVID-19 infection. Coronary artery disease.   Assessment & Plan:   Principal Problem:   Breakthrough COVID-19 Active Problems:   Diabetes mellitus without complication (HCC)   Essential hypertension   Elevated troponin  1-Breakthrough COVID-19 infection, COVID-19 pneumonia: -CTA negative for PE.  Showed patchy airspace opacity in the lower lobes bilaterally as well as the lingula compatible with multifocal airspace disease. -Continue with Remdesivir. Day 2.  -Continue with albuterol, vitamin C. -Patient was already on prednisone will continue. -she doesn't feel well, feel sick. Report nausea.   2-Elevated troponin: -EKG without ST segment elevation. -Patient with shortness of breath, differential myocarditis. -Cardiology consulted. No further cardiac evaluation.  -2D echo. Normal EF, no wall motion abnormalities.   3-Hypertension; uncontrolled Continue with Inderal, hydrochlorothiazide, clonidine, benazepril.  4-Diabetes type 2: Hold hold glimepiride, Metformin while inpatient. Continue with sliding scale insulin  5-increased anion  gap: Resolved.  Repeat be met. CBG 182 range.,  CO2 27. Hold Metformin.  Nausea;  Continue with zofran PRN   Estimated body mass index is 38.92 kg/m as calculated from the following:   Height as of this encounter: 5' 1" (1.549 m).   Weight as of this encounter: 93.4 kg.   DVT prophylaxis: Lovenox Code Status: Full code Family Communication: Care discussed directly with patient Disposition Plan:  Status is: Observation  The patient remains OBS appropriate and will d/c before 2 midnights.  Dispo: The patient is from: Home              Anticipated d/c is to: Home              Anticipated d/c date is: 3 days              Patient currently is not medically stable to d/c.  Complaining of shortness of breath, not feeling well, reports nausea        Consultants:   Cardiology   Procedures:   ECHO pending.   Antimicrobials:    Subjective: Patient still complaining of shortness of breath, reported nausea.  She relate that she was sick last night, her  blood pressure drop.  Nurse was afraid to get help out of the bed. Blood sugar was high 80s morning. Objective: Vitals:   10/07/19 1633 10/07/19 2100 10/08/19 0837 10/08/19 1153  BP: (!) 172/86 136/85 (!) 191/106 (!) 163/94  Pulse: 69 71 74 62  Resp: _0 Temp: 98.4 F (36.9 C) 98.2 F (36.8 C) 98.6 F (37 C) 98.7 F (37.1 C)  TempSrc: Oral Oral  Oral  SpO2: 96% 96% 98% 97%  Weight:      Height:        Intake/Output Summary (Last  24 hours) at 10/08/2019 1511 Last data filed at 10/08/2019 1032 Gross per 24 hour  Intake --  Output 400 ml  Net -400 ml   Filed Weights   10/07/19 0006  Weight: 93.4 kg    Examination:  General exam: No acute distress Respiratory system: Clear to auscultation  Cardiovascular system: S1-S2 regular rhythm or rate Gastrointestinal system: BS present, soft, nt Central nervous system: alert, orieneted Extremities: Symmetric power   Data Reviewed: I have personally  reviewed following labs and imaging studies  CBC: Recent Labs  Lab 10/07/19 0010 10/08/19 0539  WBC 16.4* 13.4*  NEUTROABS 14.7* 10.8*  HGB 16.4* 14.5  HCT 46.9* 40.4  MCV 92.5 91.6  PLT 320 761   Basic Metabolic Panel: Recent Labs  Lab 10/07/19 0010 10/08/19 0539  NA 138 136  K 3.9 3.9  CL 95* 96*  CO2 27 28  GLUCOSE 292* 168*  BUN 31* 26*  CREATININE 1.26* 0.84  CALCIUM 9.5 9.5  MG  --  1.1*  PHOS  --  4.1   GFR: Estimated Creatinine Clearance: 66.8 mL/min (by C-G formula based on SCr of 0.84 mg/dL). Liver Function Tests: Recent Labs  Lab 10/07/19 0010 10/08/19 0539  AST 49* 23  ALT 37 30  ALKPHOS 63 49  BILITOT 0.9 0.8  PROT 8.6* 7.1  ALBUMIN 4.4 3.7   Recent Labs  Lab 10/07/19 0010  LIPASE 47   No results for input(s): AMMONIA in the last 168 hours. Coagulation Profile: No results for input(s): INR, PROTIME in the last 168 hours. Cardiac Enzymes: No results for input(s): CKTOTAL, CKMB, CKMBINDEX, TROPONINI in the last 168 hours. BNP (last 3 results) No results for input(s): PROBNP in the last 8760 hours. HbA1C: No results for input(s): HGBA1C in the last 72 hours. CBG: Recent Labs  Lab 10/07/19 1135 10/07/19 1727 10/07/19 2111 10/08/19 0833 10/08/19 1151  GLUCAP 193* 202* 224* 153* 218*   Lipid Profile: No results for input(s): CHOL, HDL, LDLCALC, TRIG, CHOLHDL, LDLDIRECT in the last 72 hours. Thyroid Function Tests: No results for input(s): TSH, T4TOTAL, FREET4, T3FREE, THYROIDAB in the last 72 hours. Anemia Panel: Recent Labs    10/08/19 0539  FERRITIN 206   Sepsis Labs: Recent Labs  Lab 10/07/19 0240  PROCALCITON <0.10    No results found for this or any previous visit (from the past 240 hour(s)).       Radiology Studies: CT Angio Chest PE W and/or Wo Contrast  Result Date: 10/07/2019 CLINICAL DATA:  PE suspected.  High probability.  COVID positive. EXAM: CT ANGIOGRAPHY CHEST WITH CONTRAST TECHNIQUE: Multidetector CT  imaging of the chest was performed using the standard protocol during bolus administration of intravenous contrast. Multiplanar CT image reconstructions and MIPs were obtained to evaluate the vascular anatomy. CONTRAST:  46m OMNIPAQUE IOHEXOL 350 MG/ML SOLN COMPARISON:  One-view chest x-ray 10/07/2019 FINDINGS: Cardiovascular: Heart size is normal. Extensive coronary artery calcifications are present. Atherosclerotic changes are present in the aorta and at the origins the great vessels without significant stenosis. No aneurysm is present. Pulmonary artery opacification is excellent. No focal filling defects are present to suggest pulmonary embolus. Mediastinum/Nodes: No significant mediastinal, hilar, or axillary adenopathy is present. Thoracic inlet is within normal limits. Esophagus is normal. Lungs/Pleura: Patchy airspace opacities are present in the lower lobes bilaterally as well as the lingula. No significant consolidation is present. No significant pleural disease is present. Upper Abdomen: Hyperdense gallstones are present. No inflammatory changes are present in the gallbladder.  Visualized liver is within normal limits. Upper abdomen is otherwise unremarkable. Musculoskeletal: Multilevel degenerative changes are present in the thoracic spine. Vacuum disc is present at T12-L1. Vertebral body heights are maintained. No acute or healing fractures are present. Ribs are unremarkable. No focal lytic or blastic lesions are present. Review of the MIP images confirms the above findings. IMPRESSION: 1. No evidence for pulmonary embolus. 2. Patchy airspace opacities in the lower lobes bilaterally as well as the lingula compatible with multifocal airspace disease, likely viral pneumonia in the setting of COVID-19 infection. 3. Coronary artery disease. 4. Cholelithiasis without evidence for cholecystitis. 5. Aortic Atherosclerosis (ICD10-I70.0). Electronically Signed   By: Marin Roberts M.D.   On: 10/07/2019  04:16   DG Chest Portable 1 View  Result Date: 10/07/2019 CLINICAL DATA:  COVID positive EXAM: PORTABLE CHEST 1 VIEW COMPARISON:  None. FINDINGS: The heart size and mediastinal contours are within normal limits. Both lungs are clear. The visualized skeletal structures are unremarkable. IMPRESSION: No active disease. Electronically Signed   By: Jonna Clark M.D.   On: 10/07/2019 02:25   ECHOCARDIOGRAM COMPLETE  Result Date: 10/08/2019    ECHOCARDIOGRAM REPORT   Patient Name:   Jodi Chavez Date of Exam: 10/07/2019 Medical Rec #:  [id number]      Height:       61.0 in Accession #:    [id number]     Weight:       206.0 lb Date of Birth:  1950/07/29      BSA:          1.913 m Patient Age:    69 years       BP:           162/81 mmHg Patient Gender: F              HR:           82 bpm. Exam Location:  [facility] Procedure: 2D Echo, Cardiac Doppler and Color Doppler Indications:     Elevated troponin  History:         Patient has no prior history of Echocardiogram examinations.                  Risk Factors:Hypertension, Diabetes and Dyslipidemia.  Sonographer:     Cristela Blue RDCS (AE) Referring Phys:  [id number] Alba Cory Diagnosing Phys: Arnoldo Hooker MD  Sonographer Comments: Technically challenging study due to limited acoustic windows and suboptimal apical window. IMPRESSIONS  1. Left ventricular ejection fraction, by estimation, is 50 to 55%. The left ventricle has low normal function. The left ventricle has no regional wall motion abnormalities. Left ventricular diastolic parameters were normal.  2. Right ventricular systolic function is normal. The right ventricular size is normal. There is normal pulmonary artery systolic pressure.  3. The mitral valve is normal in structure. Mild mitral valve regurgitation.  4. The aortic valve is normal in structure. Aortic valve regurgitation is not visualized. FINDINGS  Left Ventricle: Left ventricular ejection fraction, by estimation, is 50 to 55%. The left ventricle has  low normal function. The left ventricle has no regional wall motion abnormalities. The left ventricular internal cavity size was normal in size. There is no left ventricular hypertrophy. Left ventricular diastolic parameters were normal. Right Ventricle: The right ventricular size is normal. No increase in right ventricular wall thickness. Right ventricular systolic function is normal. There is normal pulmonary artery systolic pressure. The tricuspid regurgitant velocity is 1.36 m/s, and  with an assumed  right atrial pressure of 10 mmHg, the estimated right ventricular systolic pressure is 37.6 mmHg. Left Atrium: Left atrial size was normal in size. Right Atrium: Right atrial size was normal in size. Pericardium: There is no evidence of pericardial effusion. Mitral Valve: The mitral valve is normal in structure. Mild mitral valve regurgitation. Tricuspid Valve: The tricuspid valve is normal in structure. Tricuspid valve regurgitation is trivial. Aortic Valve: The aortic valve is normal in structure. Aortic valve regurgitation is not visualized. Aortic valve mean gradient measures 2.0 mmHg. Aortic valve peak gradient measures 2.9 mmHg. Aortic valve area, by VTI measures 2.55 cm. Pulmonic Valve: The pulmonic valve was normal in structure. Pulmonic valve regurgitation is not visualized. Aorta: The aortic root and ascending aorta are structurally normal, with no evidence of dilitation. IAS/Shunts: No atrial level shunt detected by color flow Doppler.  LEFT VENTRICLE PLAX 2D LVIDd:         2.63 cm  Diastology LVIDs:         1.69 cm  LV e' lateral:   5.44 cm/s LV PW:         1.36 cm  LV E/e' lateral: 0.0 LV IVS:        1.02 cm  LV e' medial:    4.90 cm/s LVOT diam:     1.90 cm  LV E/e' medial:  0.0 LV SV:         56 LV SV Index:   29 LVOT Area:     2.84 cm  RIGHT VENTRICLE RV S prime:     14.40 cm/s LEFT ATRIUM         Index LA diam:    3.80 cm 1.99 cm/m  AORTIC VALVE                   PULMONIC VALVE AV Area (Vmax):     2.25 cm    PV Vmax:        0.84 m/s AV Area (Vmean):   2.18 cm    PV Peak grad:   2.8 mmHg AV Area (VTI):     2.55 cm    RVOT Peak grad: 4 mmHg AV Vmax:           84.60 cm/s AV Vmean:          63.500 cm/s AV VTI:            0.219 m AV Peak Grad:      2.9 mmHg AV Mean Grad:      2.0 mmHg LVOT Vmax:         67.20 cm/s LVOT Vmean:        48.800 cm/s LVOT VTI:          0.197 m LVOT/AV VTI ratio: 0.90  AORTA Ao Root diam: 2.20 cm MITRAL VALVE                TRICUSPID VALVE MV Area (PHT): 2.87 cm     TR Peak grad:   7.4 mmHg MV Decel Time: 264 msec     TR Vmax:        136.00 cm/s MV E velocity: 0.00 cm/s MV A velocity: 130.00 cm/s  SHUNTS MV E/A ratio:  0.00         Systemic VTI:  0.20 m                             Systemic Diam: 1.90 cm Serafina Royals MD Electronically  signed by Serafina Royals MD Signature Date/Time: 10/08/2019/8:56:49 AM    Final         Scheduled Meds: . albuterol  2 puff Inhalation Q6H  . vitamin C  500 mg Oral Daily  . benazepril  40 mg Oral QPM  . cloNIDine  0.2 mg Oral BID  . enoxaparin (LOVENOX) injection  40 mg Subcutaneous Q24H  . ezetimibe  10 mg Oral QPC breakfast  . hydrochlorothiazide  25 mg Oral QPC breakfast  . insulin aspart  0-15 Units Subcutaneous TID WC  . insulin aspart  0-5 Units Subcutaneous QHS  . magnesium oxide  200 mg Oral BID  . multivitamin with minerals  1 tablet Oral Daily  . predniSONE  40 mg Oral Daily  . propranolol  20 mg Oral TID  . simvastatin  40 mg Oral QPM  . zinc sulfate  220 mg Oral Daily   Continuous Infusions: . remdesivir 100 mg in NS 100 mL 100 mg (10/08/19 1108)     LOS: 0 days    Time spent: 35 minutes.     [name], MD [facility]   If 7PM-7AM, please contact night-coverage [email or web address]  10/08/2019, 3:11 PM

## 2019-10-09 LAB — GLUCOSE, CAPILLARY
Glucose-Capillary: 146 mg/dL — ABNORMAL HIGH (ref 70–99)
Glucose-Capillary: 219 mg/dL — ABNORMAL HIGH (ref 70–99)
Glucose-Capillary: 267 mg/dL — ABNORMAL HIGH (ref 70–99)
Glucose-Capillary: 334 mg/dL — ABNORMAL HIGH (ref 70–99)

## 2019-10-09 LAB — COMPREHENSIVE METABOLIC PANEL
ALT: 29 U/L (ref 0–44)
AST: 24 U/L (ref 15–41)
Albumin: 3.7 g/dL (ref 3.5–5.0)
Alkaline Phosphatase: 47 U/L (ref 38–126)
Anion gap: 11 (ref 5–15)
BUN: 23 mg/dL (ref 8–23)
CO2: 29 mmol/L (ref 22–32)
Calcium: 9.6 mg/dL (ref 8.9–10.3)
Chloride: 95 mmol/L — ABNORMAL LOW (ref 98–111)
Creatinine, Ser: 0.79 mg/dL (ref 0.44–1.00)
GFR calc Af Amer: >60 mL/min (ref >60)
GFR calc non Af Amer: >60 mL/min (ref >60)
Glucose, Bld: 159 mg/dL — ABNORMAL HIGH (ref 70–99)
Potassium: 4.1 mmol/L (ref 3.5–5.1)
Sodium: 135 mmol/L (ref 135–145)
Total Bilirubin: 0.8 mg/dL (ref 0.3–1.2)
Total Protein: 7.2 g/dL (ref 6.5–8.1)

## 2019-10-09 LAB — CBC WITH DIFFERENTIAL/PLATELET
Abs Immature Granulocytes: 0.23 10*3/uL — ABNORMAL HIGH (ref 0.00–0.07)
Basophils Absolute: 0 10*3/uL (ref 0.0–0.1)
Basophils Relative: 0 %
Eosinophils Absolute: 0 10*3/uL (ref 0.0–0.5)
Eosinophils Relative: 0 %
HCT: 42.2 % (ref 36.0–46.0)
Hemoglobin: 15.2 g/dL — ABNORMAL HIGH (ref 12.0–15.0)
Immature Granulocytes: 1 %
Lymphocytes Relative: 12 %
Lymphs Abs: 2 10*3/uL (ref 0.7–4.0)
MCH: 32.3 pg (ref 26.0–34.0)
MCHC: 36 g/dL (ref 30.0–36.0)
MCV: 89.8 fL (ref 80.0–100.0)
Monocytes Absolute: 1 10*3/uL (ref 0.1–1.0)
Monocytes Relative: 6 %
Neutro Abs: 13.1 10*3/uL — ABNORMAL HIGH (ref 1.7–7.7)
Neutrophils Relative %: 81 %
Platelets: 291 10*3/uL (ref 150–400)
RBC: 4.7 MIL/uL (ref 3.87–5.11)
RDW: 11.9 % (ref 11.5–15.5)
WBC: 16.4 10*3/uL — ABNORMAL HIGH (ref 4.0–10.5)
nRBC: 0 % (ref 0.0–0.2)

## 2019-10-09 LAB — C-REACTIVE PROTEIN: CRP: 0.6 mg/dL (ref <1.0)

## 2019-10-09 LAB — PHOSPHORUS: Phosphorus: 4.5 mg/dL (ref 2.5–4.6)

## 2019-10-09 LAB — FERRITIN: Ferritin: 264 ng/mL (ref 11–307)

## 2019-10-09 LAB — MAGNESIUM: Magnesium: 1.3 mg/dL — ABNORMAL LOW (ref 1.7–2.4)

## 2019-10-09 LAB — FIBRIN DERIVATIVES D-DIMER (ARMC ONLY): Fibrin derivatives D-dimer (ARMC): 827.03 ng/mL (FEU) — ABNORMAL HIGH (ref 0.00–499.00)

## 2019-10-09 MED ORDER — AMLODIPINE BESYLATE 5 MG PO TABS
5.0000 mg | ORAL_TABLET | Freq: Every day | ORAL | Status: DC
  Administered 2019-10-09 – 2019-10-12 (×4): 5 mg via ORAL
  Filled 2019-10-09 (×4): qty 1

## 2019-10-09 MED ORDER — POLYETHYLENE GLYCOL 3350 17 G PO PACK
17.0000 g | PACK | Freq: Every day | ORAL | Status: DC
  Administered 2019-10-09 – 2019-10-12 (×4): 17 g via ORAL
  Filled 2019-10-09 (×3): qty 1

## 2019-10-09 MED ORDER — SENNA 8.6 MG PO TABS
1.0000 | ORAL_TABLET | Freq: Every day | ORAL | Status: DC
  Administered 2019-10-09 – 2019-10-12 (×4): 8.6 mg via ORAL
  Filled 2019-10-09 (×4): qty 1

## 2019-10-09 MED ORDER — INSULIN GLARGINE 100 UNIT/ML ~~LOC~~ SOLN
12.0000 [IU] | Freq: Every day | SUBCUTANEOUS | Status: DC
  Administered 2019-10-09 – 2019-10-10 (×2): 12 [IU] via SUBCUTANEOUS
  Filled 2019-10-09 (×3): qty 0.12

## 2019-10-09 MED ORDER — MAGNESIUM SULFATE 2 GM/50ML IV SOLN
2.0000 g | Freq: Once | INTRAVENOUS | Status: AC
  Administered 2019-10-09: 13:00:00 2 g via INTRAVENOUS
  Filled 2019-10-09: qty 50

## 2019-10-09 NOTE — Progress Notes (Addendum)
PROGRESS NOTE    Jodi Chavez  XAJ:[id number] DOB: 05-Oct-1950 DOA: 10/07/2019 PCP: Nolene Ebbs, MD   Brief Narrative: 69 year old with past medical history significant for diabetes, hypertension, hyperlipidemia, who received Covid vaccine varicella x2 presents with shortness of breath, cough.  Patient was tested positive for Covid on August 2.  Patient was able to see her PCP and she received a monoclonal antibody a few days prior to admission, but her symptoms continue to get worse for this reason she presented to the ED. Evaluation in the ED blood pressure elevated 199/100, troponins 290/270, D-dimer 1449.  Chest x-ray no acute disease. CTA: No evidence for pulmonary embolus. Patchy airspace opacities in the lower lobes bilaterally as well as the lingula compatible with multifocal airspace disease, likely viral pneumonia in the setting of COVID-19 infection. Coronary artery disease.   Assessment & Plan:   Principal Problem:   Breakthrough COVID-19 Active Problems:   Diabetes mellitus without complication (HCC)   Essential hypertension   Elevated troponin  1-Breakthrough COVID-19 infection, COVID-19 pneumonia: -CTA negative for PE.  Showed patchy airspace opacity in the lower lobes bilaterally as well as the lingula compatible with multifocal airspace disease. -Continue with Remdesivir. Day 3.  -Continue with albuterol, vitamin C. -Patient was already on prednisone will continue. -Patient is still complaining of nausea.  Continue to be afebrile, oxygen saturation normal.  2-Elevated troponin: -EKG without ST segment elevation. -Patient with shortness of breath, differential myocarditis. -Cardiology consulted. No further cardiac evaluation.  -2D echo. Normal EF, no wall motion abnormalities.   3-Hypertension; uncontrolled Continue with Inderal, hydrochlorothiazide, clonidine, benazepril. Will add Norvasc for better BP controlled.   4-Diabetes type 2: Hold hold glimepiride,  Metformin while inpatient. Continue with sliding scale insulin  5-increased anion gap: Resolved.  Repeat be met. CBG 182 range.,  CO2 27. Hold Metformin.  Nausea;  Continue with zofran PRN   Estimated body mass index is 38.92 kg/m as calculated from the following:   Height as of this encounter: 5' 1"  (1.549 m).   Weight as of this encounter: 93.4 kg.   DVT prophylaxis: Lovenox Code Status: Full code Family Communication: Care discussed directly with patient Disposition Plan:  Status is: Inpatient  Dispo: The patient is from: Home              Anticipated d/c is to: SNF              Anticipated d/c date is: 3 days              Patient currently is not medically stable to d/c.  Patient is not a stable to be discharged to continue complaining of nausea, weakness, fatigue.      Consultants:   Cardiology   Procedures:   ECHO pending.   Antimicrobials:    Subjective: Patient is not feeling well today, reports nausea, mild shortness of breath. She does not feel stable to go home.  She is considering  going to rehab Objective: Vitals:   10/09/19 0451 10/09/19 0534 10/09/19 0537 10/09/19 0812  BP: (!) 151/103 (!) 144/55 (!) 151/70 (!) 196/73  Pulse: 60 (!) 58 (!) 56 62  Resp: 16 18 18 16   Temp: (!) 97.5 F (36.4 C)  98.4 F (36.9 C) 98.8 F (37.1 C)  TempSrc: Oral  Oral Oral  SpO2: 99% 94% 96% 96%  Weight:      Height:        Intake/Output Summary (Last 24 hours) at 10/09/2019 1531 Last data  filed at 10/08/2019 2145 Gross per 24 hour  Intake 360 ml  Output --  Net 360 ml   Filed Weights   10/07/19 0006  Weight: 93.4 kg    Examination:  General exam: NAD Respiratory system: CTA Cardiovascular system: S1, S2 regular rhythm or rate Gastrointestinal system: Bowel sounds present, soft nontender nondistended Central nervous system: Alert and oriented Extremities: Symmetric power   Data Reviewed: I have personally reviewed following labs and imaging  studies  CBC: Recent Labs  Lab 10/07/19 0010 10/08/19 0539 10/09/19 0516  WBC 16.4* 13.4* 16.4*  NEUTROABS 14.7* 10.8* 13.1*  HGB 16.4* 14.5 15.2*  HCT 46.9* 40.4 42.2  MCV 92.5 91.6 89.8  PLT 320 263 154   Basic Metabolic Panel: Recent Labs  Lab 10/07/19 0010 10/08/19 0539 10/09/19 0516  NA 138 136 135  K 3.9 3.9 4.1  CL 95* 96* 95*  CO2 27 28 29   GLUCOSE 292* 168* 159*  BUN 31* 26* 23  CREATININE 1.26* 0.84 0.79  CALCIUM 9.5 9.5 9.6  MG  --  1.1* 1.3*  PHOS  --  4.1 4.5   GFR: Estimated Creatinine Clearance: 70.1 mL/min (by C-G formula based on SCr of 0.79 mg/dL). Liver Function Tests: Recent Labs  Lab 10/07/19 0010 10/08/19 0539 10/09/19 0516  AST 49* 23 24  ALT 37 30 29  ALKPHOS 63 49 47  BILITOT 0.9 0.8 0.8  PROT 8.6* 7.1 7.2  ALBUMIN 4.4 3.7 3.7   Recent Labs  Lab 10/07/19 0010  LIPASE 47   No results for input(s): AMMONIA in the last 168 hours. Coagulation Profile: No results for input(s): INR, PROTIME in the last 168 hours. Cardiac Enzymes: No results for input(s): CKTOTAL, CKMB, CKMBINDEX, TROPONINI in the last 168 hours. BNP (last 3 results) No results for input(s): PROBNP in the last 8760 hours. HbA1C: No results for input(s): HGBA1C in the last 72 hours. CBG: Recent Labs  Lab 10/08/19 1151 10/08/19 1637 10/08/19 2048 10/09/19 0806 10/09/19 1226  GLUCAP 218* 246* 287* 146* 219*   Lipid Profile: No results for input(s): CHOL, HDL, LDLCALC, TRIG, CHOLHDL, LDLDIRECT in the last 72 hours. Thyroid Function Tests: No results for input(s): TSH, T4TOTAL, FREET4, T3FREE, THYROIDAB in the last 72 hours. Anemia Panel: Recent Labs    10/08/19 0539 10/09/19 0516  FERRITIN 206 264   Sepsis Labs: Recent Labs  Lab 10/07/19 0240  PROCALCITON <0.10    No results found for this or any previous visit (from the past 240 hour(s)).       Radiology Studies: No results found.      Scheduled Meds: . albuterol  2 puff Inhalation  Q6H  . vitamin C  500 mg Oral Daily  . benazepril  40 mg Oral QPM  . cloNIDine  0.2 mg Oral BID  . enoxaparin (LOVENOX) injection  40 mg Subcutaneous Q24H  . ezetimibe  10 mg Oral QPC breakfast  . hydrochlorothiazide  25 mg Oral QPC breakfast  . insulin aspart  0-15 Units Subcutaneous TID WC  . insulin aspart  0-5 Units Subcutaneous QHS  . insulin glargine  12 Units Subcutaneous Daily  . magnesium oxide  200 mg Oral BID  . multivitamin with minerals  1 tablet Oral Daily  . polyethylene glycol  17 g Oral Daily  . predniSONE  40 mg Oral Daily  . propranolol  20 mg Oral TID  . senna  1 tablet Oral Daily  . simvastatin  40 mg Oral QPM  .  zinc sulfate  220 mg Oral Daily   Continuous Infusions: . remdesivir 100 mg in NS 100 mL 100 mg (10/09/19 0851)     LOS: 1 day    Time spent: 35 minutes.     [name], MD [facility]   If 7PM-7AM, please contact night-coverage [email or web address]  10/09/2019, 3:31 PM

## 2019-10-09 NOTE — Evaluation (Signed)
Physical Therapy Evaluation Patient Details Name: Jodi Chavez MRN: [id number] DOB: Jun 04, 1950 Today's Date: 10/09/2019   History of Present Illness  presented to ER secondary to progressive cough, SOB; admitted for management of breakthrough COVID-19. Noted with elevated troponin, likely demand ischemia per cardiology.  Clinical Impression  Upon evaluation, patient alert and oriented; follows commands and demonstrates fair/good effort with mobility tasks.  Patient with multiple needs, requests during room; attended to within abilities.  Bilat UE/LE strength and ROM grossly symmetrical and WFL; no focal weakness appreciated.  Able to complete bed mobility with cga/min assist; sit/stand, basic transfers and gait (30') with RW, cga/min assist.  Demonstrates partially reciprocal stepping pattern; forward trunk flexion with RW arms length anterior to patient.  Appears to have limited foot flat R LE, often accepting weight in forefoot only.  Able to negotiate turns, obstacles and room environment without buckling, LOB; maintains sats >92% on RA.  Do recommend continued use of RW for all mobility for optimal safety/indep with mobility tasks. Would benefit from skilled PT to address above deficits and promote optimal return to PLOF.; Recommend transition to HHPT upon discharge from acute hospitalization.     Follow Up Recommendations Home health PT    Equipment Recommendations  Rolling walker with 5" wheels;3in1 (PT)    Recommendations for Other Services       Precautions / Restrictions Precautions Precautions: Fall Restrictions Weight Bearing Restrictions: No      Mobility  Bed Mobility Overal bed mobility: Needs Assistance Bed Mobility: Supine to Sit;Sit to Supine     Supine to sit: Min guard Sit to supine: Min assist   General bed mobility comments: assist to elevate bilat LEs into bed  Transfers Overall transfer level: Needs assistance Equipment used: Rolling walker (2  wheeled) Transfers: Sit to/from Stand Sit to Stand: Min guard         General transfer comment: broad BOS, does require use of UEs to assist with lift off and external stabilization  Ambulation/Gait Ambulation/Gait assistance: Min guard Gait Distance (Feet): 30 Feet Assistive device: Rolling walker (2 wheeled)       General Gait Details: partially reciprocal stepping pattern; forward trunk flexion with RW arms length anterior to patient.  Appears to have limited foot flat R LE, often accepting weight in forefoot only.  Able to negotiate turns, obstacles and room environment without buckling, LOB; maintains sats >92% on RA.  Do recommend continued use of RW for all mobility for optimal safety/indep with mobility tasks.  Stairs            Wheelchair Mobility    Modified Rankin (Stroke Patients Only)       Balance Overall balance assessment: Needs assistance Sitting-balance support: No upper extremity supported;Feet supported Sitting balance-Leahy Scale: Good     Standing balance support: Bilateral upper extremity supported Standing balance-Leahy Scale: Fair                               Pertinent Vitals/Pain Pain Assessment: No/denies pain    Home Living Family/patient expects to be discharged to:: Private residence Living Arrangements: Alone Available Help at Discharge: Family Type of Home: House Home Access: Ramped entrance     Home Layout: One level Home Equipment: Environmental consultant - 2 wheels;Walker - 4 wheels      Prior Function Level of Independence: Independent with assistive device(s)         Comments: Mod indep with RW for  ADLs, household and limited community mobilization; no home O2.     Hand Dominance        Extremity/Trunk Assessment   Upper Extremity Assessment Upper Extremity Assessment: Overall WFL for tasks assessed (grossly at least 4/5 throughout)    Lower Extremity Assessment Lower Extremity Assessment: Overall WFL for  tasks assessed (grossly at least 4/5 throughout)       Communication   Communication: No difficulties  Cognition Arousal/Alertness: Awake/alert Behavior During Therapy: WFL for tasks assessed/performed Overall Cognitive Status: Within Functional Limits for tasks assessed                                        General Comments      Exercises Other Exercises Other Exercises: Toilet transfer, SPT without assist device, cga/close sup.  Prefers positioning that allows UE support at all times; set up/sup with hygiene and peri-care.  Sit/stand from [facility] without assist device, cga/clsoe sup; static standing, close sup.  Limited functional reach evident, but patient with good awareness and use of compensatory strategies as needed. Other Exercises: Oral care and grooming in unsupported sitting, set up/sup. Other Exercises: OOB sitting in recliner x5 minutes; requesting return to bed prior to therapist exiting room.   Assessment/Plan    PT Assessment Patient needs continued PT services  PT Problem List Decreased activity tolerance;Decreased balance;Decreased mobility;Cardiopulmonary status limiting activity;Obesity       PT Treatment Interventions DME instruction;Gait training;Functional mobility training;Therapeutic activities;Therapeutic exercise;Balance training;Patient/family education    PT Goals (Current goals can be found in the Care Plan section)  Acute Rehab PT Goals Patient Stated Goal: to get better; the doctor says I may need rehab if I don't leave before Monday PT Goal Formulation: With patient Time For Goal Achievement: 10/23/19 Potential to Achieve Goals: Good    Frequency Min 2X/week   Barriers to discharge        Co-evaluation               AM-PAC PT "6 Clicks" Mobility  Outcome Measure Help needed turning from your back to your side while in a flat bed without using bedrails?: None Help needed moving from lying on your back to sitting on the  side of a flat bed without using bedrails?: A Little Help needed moving to and from a bed to a chair (including a wheelchair)?: A Little Help needed standing up from a chair using your arms (e.g., wheelchair or bedside chair)?: A Little Help needed to walk in hospital room?: A Little Help needed climbing 3-5 steps with a railing? : A Little 6 Click Score: 19    End of Session Equipment Utilized During Treatment: Gait belt Activity Tolerance: Patient tolerated treatment well Patient left: in bed;with call bell/phone within reach;with bed alarm set Nurse Communication: Mobility status PT Visit Diagnosis: Muscle weakness (generalized) (M62.81);Difficulty in walking, not elsewhere classified (R26.2)    Time: 1103-1200 PT Time Calculation (min) (ACUTE ONLY): 57 min   Charges:   PT Evaluation $PT Eval Moderate Complexity: 1 Mod PT Treatments $Therapeutic Activity: 38-52 mins        Tomica Arseneault H. Manson Passey, PT, DPT, NCS 10/09/19, 3:47 PM [phone]

## 2019-10-09 NOTE — Progress Notes (Signed)
OT Cancellation Note  Patient Details Name: Jodi Chavez MRN: [id number] DOB: 1950-10-26   Cancelled Treatment:    Reason Eval/Treat Not Completed: OT screened, no needs identified, will sign off. Per conversation c PT, pt is near baseline for ADLs. Pt required setup/supervision only for seated ADLs and SBA for standing ADLs. Pt has no skilled acute OT needs identified, will sign off at this time. Please re-consult if new needs are identified.   Kathie Dike, M.S. OTR/L  10/09/19, 4:52 PM  ascom [phone]

## 2019-10-09 NOTE — Plan of Care (Signed)

## 2019-10-10 LAB — CBC WITH DIFFERENTIAL/PLATELET
Abs Immature Granulocytes: 0.28 10*3/uL — ABNORMAL HIGH (ref 0.00–0.07)
Basophils Absolute: 0 10*3/uL (ref 0.0–0.1)
Basophils Relative: 0 %
Eosinophils Absolute: 0 10*3/uL (ref 0.0–0.5)
Eosinophils Relative: 0 %
HCT: 42.7 % (ref 36.0–46.0)
Hemoglobin: 14.8 g/dL (ref 12.0–15.0)
Immature Granulocytes: 2 %
Lymphocytes Relative: 12 %
Lymphs Abs: 1.9 10*3/uL (ref 0.7–4.0)
MCH: 32.2 pg (ref 26.0–34.0)
MCHC: 34.7 g/dL (ref 30.0–36.0)
MCV: 93 fL (ref 80.0–100.0)
Monocytes Absolute: 1.1 10*3/uL — ABNORMAL HIGH (ref 0.1–1.0)
Monocytes Relative: 7 %
Neutro Abs: 12.7 10*3/uL — ABNORMAL HIGH (ref 1.7–7.7)
Neutrophils Relative %: 79 %
Platelets: 275 10*3/uL (ref 150–400)
RBC: 4.59 MIL/uL (ref 3.87–5.11)
RDW: 11.9 % (ref 11.5–15.5)
WBC: 16 10*3/uL — ABNORMAL HIGH (ref 4.0–10.5)
nRBC: 0 % (ref 0.0–0.2)

## 2019-10-10 LAB — GLUCOSE, CAPILLARY
Glucose-Capillary: 145 mg/dL — ABNORMAL HIGH (ref 70–99)
Glucose-Capillary: 224 mg/dL — ABNORMAL HIGH (ref 70–99)
Glucose-Capillary: 297 mg/dL — ABNORMAL HIGH (ref 70–99)
Glucose-Capillary: 387 mg/dL — ABNORMAL HIGH (ref 70–99)

## 2019-10-10 LAB — COMPREHENSIVE METABOLIC PANEL
ALT: 34 U/L (ref 0–44)
AST: 25 U/L (ref 15–41)
Albumin: 3.6 g/dL (ref 3.5–5.0)
Alkaline Phosphatase: 52 U/L (ref 38–126)
Anion gap: 12 (ref 5–15)
BUN: 29 mg/dL — ABNORMAL HIGH (ref 8–23)
CO2: 27 mmol/L (ref 22–32)
Calcium: 9.8 mg/dL (ref 8.9–10.3)
Chloride: 95 mmol/L — ABNORMAL LOW (ref 98–111)
Creatinine, Ser: 0.89 mg/dL (ref 0.44–1.00)
GFR calc Af Amer: >60 mL/min (ref >60)
GFR calc non Af Amer: >60 mL/min (ref >60)
Glucose, Bld: 154 mg/dL — ABNORMAL HIGH (ref 70–99)
Potassium: 4.6 mmol/L (ref 3.5–5.1)
Sodium: 134 mmol/L — ABNORMAL LOW (ref 135–145)
Total Bilirubin: 0.8 mg/dL (ref 0.3–1.2)
Total Protein: 6.9 g/dL (ref 6.5–8.1)

## 2019-10-10 LAB — FIBRIN DERIVATIVES D-DIMER (ARMC ONLY): Fibrin derivatives D-dimer (ARMC): 584.71 ng/mL (FEU) — ABNORMAL HIGH (ref 0.00–499.00)

## 2019-10-10 LAB — FERRITIN: Ferritin: 257 ng/mL (ref 11–307)

## 2019-10-10 LAB — MAGNESIUM: Magnesium: 1.9 mg/dL (ref 1.7–2.4)

## 2019-10-10 LAB — C-REACTIVE PROTEIN: CRP: 0.6 mg/dL (ref <1.0)

## 2019-10-10 LAB — PHOSPHORUS: Phosphorus: 4.1 mg/dL (ref 2.5–4.6)

## 2019-10-10 MED ORDER — SODIUM CHLORIDE 0.9 % IV SOLN
INTRAVENOUS | Status: DC | PRN
  Administered 2019-10-11: 10:00:00 250 mL via INTRAVENOUS

## 2019-10-10 NOTE — Progress Notes (Signed)
PROGRESS NOTE    Jodi Chavez  VAP:[id number] DOB: [birth year] DOA: 10/07/2019 PCP: Nolene Ebbs, MD   Brief Narrative: 69 year old with past medical history significant for diabetes, hypertension, hyperlipidemia, who received Covid vaccine varicella x2 presents with shortness of breath, cough.  Patient was tested positive for Covid on August 2.  Patient was able to see her PCP and she received a monoclonal antibody a few days prior to admission, but her symptoms continue to get worse for this reason she presented to the ED. Evaluation in the ED blood pressure elevated 199/100, troponins 290/270, D-dimer 1449.  Chest x-ray no acute disease. CTA: No evidence for pulmonary embolus. Patchy airspace opacities in the lower lobes bilaterally as well as the lingula compatible with multifocal airspace disease, likely viral pneumonia in the setting of COVID-19 infection. Coronary artery disease.   Assessment & Plan:   Principal Problem:   Breakthrough COVID-19 Active Problems:   Diabetes mellitus without complication (HCC)   Essential hypertension   Elevated troponin  1-Breakthrough COVID-19 infection, COVID-19 pneumonia: -CTA negative for PE.  Showed patchy airspace opacity in the lower lobes bilaterally as well as the lingula compatible with multifocal airspace disease. -Continue with Remdesivir. Day 4.  -Continue with albuterol, vitamin C. -Patient was already on prednisone will continue. Patient was not feeling stable to be discharged, she still complaining of nausea  2-Elevated troponin: -EKG without ST segment elevation. -Patient with shortness of breath, differential myocarditis. -Cardiology consulted. No further cardiac evaluation.  -2D echo. Normal EF, no wall motion abnormalities.   3-Hypertension; uncontrolled Continue with Inderal, hydrochlorothiazide, clonidine, benazepril. Added Norvasc on 8/13  4-Diabetes type 2: Hold hold glimepiride, Metformin while  inpatient. Continue with sliding scale insulin  5-Increased anion gap: Resolved.  Repeat be met. CBG 182 range.,  CO2 27. Hold Metformin.  Nausea;  Continue with zofran PRN   Estimated body mass index is 38.92 kg/m as calculated from the following:   Height as of this encounter: 5' 1"  (1.549 m).   Weight as of this encounter: 93.4 kg.   DVT prophylaxis: Lovenox Code Status: Full code Family Communication: Care discussed directly with patient Disposition Plan:  Status is: Inpatient  Dispo: The patient is from: Home              Anticipated d/c is to: SNF              Anticipated d/c date is: 3 days              Patient currently is not medically stable to d/c. Patient has been feeling stable to be discharged, she continued to have nausea, she does not feel stroganoff to go through outpatient Remdesivir.      Consultants:   Cardiology   Procedures:   ECHO pending.   Antimicrobials:    Subjective: Patient is alert and conversant. She  continued to complain of nausea    Objective: Vitals:   10/10/19 0519 10/10/19 0832 10/10/19 1011 10/10/19 1100  BP: (!) 152/59 (!) 174/78  (!) 174/68  Pulse: 62 (!) 59 73 (!) 58  Resp: 16 16 16 14   Temp: 97.8 F (36.6 C) (!) 97.5 F (36.4 C)  98.4 F (36.9 C)  TempSrc: Oral Oral  Oral  SpO2: 94% 94%  93%  Weight:      Height:        Intake/Output Summary (Last 24 hours) at 10/10/2019 1421 Last data filed at 10/10/2019 1335 Gross per 24 hour  Intake 218.33 ml  Output --  Net 218.33 ml   Filed Weights   10/07/19 0006  Weight: 93.4 kg    Examination:  General exam: NAD Respiratory system: CTA Cardiovascular system: S 1, S 2 RRR Gastrointestinal system: BS present, soft, nt, nd Central nervous system: Alert Extremities: Symmetric    Data Reviewed: I have personally reviewed following labs and imaging studies  CBC: Recent Labs  Lab 10/07/19 0010 10/08/19 0539 10/09/19 0516 10/10/19 0658  WBC 16.4*  13.4* 16.4* 16.0*  NEUTROABS 14.7* 10.8* 13.1* 12.7*  HGB 16.4* 14.5 15.2* 14.8  HCT 46.9* 40.4 42.2 42.7  MCV 92.5 91.6 89.8 93.0  PLT 320 263 291 101   Basic Metabolic Panel: Recent Labs  Lab 10/07/19 0010 10/08/19 0539 10/09/19 0516 10/10/19 0658  NA 138 136 135 134*  K 3.9 3.9 4.1 4.6  CL 95* 96* 95* 95*  CO2 27 28 29 27   GLUCOSE 292* 168* 159* 154*  BUN 31* 26* 23 29*  CREATININE 1.26* 0.84 0.79 0.89  CALCIUM 9.5 9.5 9.6 9.8  MG  --  1.1* 1.3* 1.9  PHOS  --  4.1 4.5 4.1   GFR: Estimated Creatinine Clearance: 63 mL/min (by C-G formula based on SCr of 0.89 mg/dL). Liver Function Tests: Recent Labs  Lab 10/07/19 0010 10/08/19 0539 10/09/19 0516 10/10/19 0658  AST 49* 23 24 25   ALT 37 30 29 34  ALKPHOS 63 49 47 52  BILITOT 0.9 0.8 0.8 0.8  PROT 8.6* 7.1 7.2 6.9  ALBUMIN 4.4 3.7 3.7 3.6   Recent Labs  Lab 10/07/19 0010  LIPASE 47   No results for input(s): AMMONIA in the last 168 hours. Coagulation Profile: No results for input(s): INR, PROTIME in the last 168 hours. Cardiac Enzymes: No results for input(s): CKTOTAL, CKMB, CKMBINDEX, TROPONINI in the last 168 hours. BNP (last 3 results) No results for input(s): PROBNP in the last 8760 hours. HbA1C: No results for input(s): HGBA1C in the last 72 hours. CBG: Recent Labs  Lab 10/09/19 1226 10/09/19 1614 10/09/19 2118 10/10/19 0830 10/10/19 1324  GLUCAP 219* 267* 334* 145* 224*   Lipid Profile: No results for input(s): CHOL, HDL, LDLCALC, TRIG, CHOLHDL, LDLDIRECT in the last 72 hours. Thyroid Function Tests: No results for input(s): TSH, T4TOTAL, FREET4, T3FREE, THYROIDAB in the last 72 hours. Anemia Panel: Recent Labs    10/09/19 0516 10/10/19 0658  FERRITIN 264 257   Sepsis Labs: Recent Labs  Lab 10/07/19 0240  PROCALCITON <0.10    No results found for this or any previous visit (from the past 240 hour(s)).       Radiology Studies: No results found.      Scheduled Meds: .  albuterol  2 puff Inhalation Q6H  . amLODipine  5 mg Oral Daily  . vitamin C  500 mg Oral Daily  . benazepril  40 mg Oral QPM  . cloNIDine  0.2 mg Oral BID  . enoxaparin (LOVENOX) injection  40 mg Subcutaneous Q24H  . ezetimibe  10 mg Oral QPC breakfast  . hydrochlorothiazide  25 mg Oral QPC breakfast  . insulin aspart  0-15 Units Subcutaneous TID WC  . insulin aspart  0-5 Units Subcutaneous QHS  . insulin glargine  12 Units Subcutaneous Daily  . magnesium oxide  200 mg Oral BID  . multivitamin with minerals  1 tablet Oral Daily  . polyethylene glycol  17 g Oral Daily  . predniSONE  40 mg Oral Daily  . propranolol  20 mg Oral  TID  . senna  1 tablet Oral Daily  . simvastatin  40 mg Oral QPM  . zinc sulfate  220 mg Oral Daily   Continuous Infusions: . sodium chloride Stopped (10/10/19 1301)  . remdesivir 100 mg in NS 100 mL Stopped (10/10/19 1112)     LOS: 2 days    Time spent: 35 minutes.     [name], MD [facility]   If 7PM-7AM, please contact night-coverage [email or web address]  10/10/2019, 2:21 PM

## 2019-10-11 LAB — COMPREHENSIVE METABOLIC PANEL
ALT: 38 U/L (ref 0–44)
AST: 25 U/L (ref 15–41)
Albumin: 3.7 g/dL (ref 3.5–5.0)
Alkaline Phosphatase: 57 U/L (ref 38–126)
Anion gap: 13 (ref 5–15)
BUN: 34 mg/dL — ABNORMAL HIGH (ref 8–23)
CO2: 27 mmol/L (ref 22–32)
Calcium: 10.1 mg/dL (ref 8.9–10.3)
Chloride: 93 mmol/L — ABNORMAL LOW (ref 98–111)
Creatinine, Ser: 1.06 mg/dL — ABNORMAL HIGH (ref 0.44–1.00)
GFR calc Af Amer: >60 mL/min (ref >60)
GFR calc non Af Amer: 54 mL/min — ABNORMAL LOW (ref >60)
Glucose, Bld: 204 mg/dL — ABNORMAL HIGH (ref 70–99)
Potassium: 4 mmol/L (ref 3.5–5.1)
Sodium: 133 mmol/L — ABNORMAL LOW (ref 135–145)
Total Bilirubin: 0.8 mg/dL (ref 0.3–1.2)
Total Protein: 7.1 g/dL (ref 6.5–8.1)

## 2019-10-11 LAB — CBC WITH DIFFERENTIAL/PLATELET
Abs Immature Granulocytes: 0.35 10*3/uL — ABNORMAL HIGH (ref 0.00–0.07)
Basophils Absolute: 0.1 10*3/uL (ref 0.0–0.1)
Basophils Relative: 0 %
Eosinophils Absolute: 0 10*3/uL (ref 0.0–0.5)
Eosinophils Relative: 0 %
HCT: 42.3 % (ref 36.0–46.0)
Hemoglobin: 15.1 g/dL — ABNORMAL HIGH (ref 12.0–15.0)
Immature Granulocytes: 2 %
Lymphocytes Relative: 11 %
Lymphs Abs: 2 10*3/uL (ref 0.7–4.0)
MCH: 32 pg (ref 26.0–34.0)
MCHC: 35.7 g/dL (ref 30.0–36.0)
MCV: 89.6 fL (ref 80.0–100.0)
Monocytes Absolute: 1.2 10*3/uL — ABNORMAL HIGH (ref 0.1–1.0)
Monocytes Relative: 7 %
Neutro Abs: 14.8 10*3/uL — ABNORMAL HIGH (ref 1.7–7.7)
Neutrophils Relative %: 80 %
Platelets: 299 10*3/uL (ref 150–400)
RBC: 4.72 MIL/uL (ref 3.87–5.11)
RDW: 11.8 % (ref 11.5–15.5)
WBC: 18.4 10*3/uL — ABNORMAL HIGH (ref 4.0–10.5)
nRBC: 0 % (ref 0.0–0.2)

## 2019-10-11 LAB — FIBRIN DERIVATIVES D-DIMER (ARMC ONLY): Fibrin derivatives D-dimer (ARMC): 601.76 ng/mL (FEU) — ABNORMAL HIGH (ref 0.00–499.00)

## 2019-10-11 LAB — GLUCOSE, CAPILLARY
Glucose-Capillary: 173 mg/dL — ABNORMAL HIGH (ref 70–99)
Glucose-Capillary: 217 mg/dL — ABNORMAL HIGH (ref 70–99)
Glucose-Capillary: 328 mg/dL — ABNORMAL HIGH (ref 70–99)
Glucose-Capillary: 309 mg/dL — ABNORMAL HIGH (ref 70–99)

## 2019-10-11 LAB — FERRITIN: Ferritin: 248 ng/mL (ref 11–307)

## 2019-10-11 LAB — C-REACTIVE PROTEIN: CRP: 0.8 mg/dL (ref <1.0)

## 2019-10-11 LAB — MAGNESIUM: Magnesium: 1.8 mg/dL (ref 1.7–2.4)

## 2019-10-11 LAB — PHOSPHORUS: Phosphorus: 3.6 mg/dL (ref 2.5–4.6)

## 2019-10-11 MED ORDER — PREDNISONE 20 MG PO TABS
20.0000 mg | ORAL_TABLET | Freq: Every day | ORAL | Status: DC
  Administered 2019-10-11 – 2019-10-12 (×2): 20 mg via ORAL
  Filled 2019-10-11 (×2): qty 1

## 2019-10-11 MED ORDER — INSULIN GLARGINE 100 UNIT/ML ~~LOC~~ SOLN
15.0000 [IU] | Freq: Every day | SUBCUTANEOUS | Status: DC
  Administered 2019-10-11 – 2019-10-12 (×2): 15 [IU] via SUBCUTANEOUS
  Filled 2019-10-11 (×3): qty 0.15

## 2019-10-11 NOTE — Evaluation (Signed)
Occupational Therapy Evaluation Patient Details Name: Jodi Chavez MRN: [id number] DOB: [birth year] Today's Date: 10/11/2019    History of Present Illness presented to ER secondary to progressive cough, SOB; admitted for management of breakthrough COVID-19. Noted with elevated troponin, likely demand ischemia per cardiology.   Clinical Impression   Ms Bezek was seen for OT evaluation this date. Prior to hospital admission, pt was MOD I c RW for mobility and ADLs. Pt lives in [place] apartment c sister and nephew - reports sleeping on recliner in den. Pt presents to acute OT demonstrating impaired ADL performance and functional mobility 2/2 decreased LB access, functional strength deficits, and decreased safety awareness. Pt currently requires SBA for BSC t/f including perihygiene in standing. SUPERVISION hair brushing seated on BSC - toilerated ~15 mins sitting. SBA + forearm support on counter for hand washing standing sinkside and ~8 mins grooming. Pt educated on HEP for functional strengthening, home/routines modifications, falls preventions, and ECS. Pt would benefit from skilled OT to address noted impairments and functional limitations (see below for any additional details) in order to maximize safety and independence while minimizing falls risk and caregiver burden. Upon hospital discharge, recommend HHOT to maximize pt safety and return to functional independence during meaningful occupations of daily life.     Follow Up Recommendations  Home health OT    Equipment Recommendations  3 in 1 bedside commode    Recommendations for Other Services       Precautions / Restrictions Precautions Precautions: Fall Restrictions Weight Bearing Restrictions: No      Mobility Bed Mobility Overal bed mobility: Needs Assistance Bed Mobility: Supine to Sit;Sit to Supine     Supine to sit: Supervision Sit to supine: Supervision   General bed mobility comments: MOD I scoot higher in bed.  SUPERVISION sup<>sit at EOB  Transfers Overall transfer level: Needs assistance Equipment used: 1 person hand held assist Transfers: Sit to/from [employer] Sit to Stand: Supervision Stand pivot transfers: Min guard       General transfer comment: SBA + single UE support on rail for SPT bed>BSC>standing sinkside>bed    Balance Overall balance assessment: Needs assistance Sitting-balance support: No upper extremity supported;Feet supported Sitting balance-Leahy Scale: Good     Standing balance support: Bilateral upper extremity supported Standing balance-Leahy Scale: Fair       ADL either performed or assessed with clinical judgement   ADL Overall ADL's : Needs assistance/impaired        General ADL Comments: SBA for BSC t/f and perihygiene in standing. SUPERVISION hair brushing seated on BSC - toilerated ~15 mins sitting. SBA + forearm support on ocunter for hand washing standing sinkside and ~5 mins grooming.      Vision Baseline Vision/History: Wears glasses Wears Glasses: At all times Additional Comments: Pt noted to have difficulty identifying buttons on remote and drinks across room - wearing glasses t/o      Pertinent Vitals/Pain Pain Assessment: No/denies pain     Hand Dominance     Extremity/Trunk Assessment Upper Extremity Assessment Upper Extremity Assessment: Overall WFL for tasks assessed   Lower Extremity Assessment Lower Extremity Assessment: RLE deficits/detail;LLE deficits/detail RLE Deficits / Details: decreased knee AROM, pt reports hx of B knee pain  LLE Deficits / Details: decreased knee AROM, pt reports hx of B knee pain        Communication Communication Communication: No difficulties   Cognition Arousal/Alertness: Awake/alert Behavior During Therapy: WFL for tasks assessed/performed; Agitated Overall Cognitive Status: Within Functional  Limits for tasks assessed        General Comments  SpO2 mid to high 90s on RA      Exercises Exercises: Other exercises Other Exercises Other Exercises: Pt educated re: OT role, DME recs, d/c recs, HEP, importance of mobility for functional strengthening, falls prevention, home/routines modifications Other Exercises: Toileting, UBD, hand washing, hair brushing, sup<>sit, sit<>stand, bed mobility, SPT, sitting/standing balance/tolerance   Shoulder Instructions      Home Living Family/patient expects to be discharged to:: Private residence Living Arrangements: Other relatives Available Help at Discharge: Family (sister, nephew) Type of Home: House Home Access: Ramped entrance     Home Layout: One level               Home Equipment: Environmental consultant - 2 wheels;Walker - 4 wheels   Additional Comments: Pt reports nephew cannot physically assist.       Prior Functioning/Environment Level of Independence: Independent with assistive device(s)        Comments: Mod indep with RW for ADLs, household and limited community mobilization; no home O2. Pt reports sleeping in recliner in den        OT Problem List: Decreased range of motion;Decreased knowledge of use of DME or AE      OT Treatment/Interventions: Self-care/ADL training;Therapeutic exercise;Energy conservation;DME and/or AE instruction;Therapeutic activities;Balance training;Patient/family education    OT Goals(Current goals can be found in the care plan section) Acute Rehab OT Goals Patient Stated Goal: to get better OT Goal Formulation: With patient Time For Goal Achievement: 10/25/19 Potential to Achieve Goals: Good ADL Goals Pt Will Perform Grooming: with modified independence;standing (c LRAD PRN) Pt Will Perform Lower Body Dressing: with modified independence;sit to/from stand (c LRAD PRN) Pt Will Transfer to Toilet: with modified independence;ambulating;bedside commode (c LRAD PRN)  OT Frequency: Min 1X/week    AM-PAC OT "6 Clicks" Daily Activity     Outcome Measure Help from another person  eating meals?: None Help from another person taking care of personal grooming?: A Little Help from another person toileting, which includes using toliet, bedpan, or urinal?: A Little Help from another person bathing (including washing, rinsing, drying)?: A Little Help from another person to put on and taking off regular upper body clothing?: None Help from another person to put on and taking off regular lower body clothing?: A Little 6 Click Score: 20   End of Session Nurse Communication: Mobility status  Activity Tolerance: Patient tolerated treatment well Patient left: in bed;with call bell/phone within reach  OT Visit Diagnosis: Other abnormalities of gait and mobility (R26.89)                Time: 6644-0347 OT Time Calculation (min): 50 min Charges:  OT General Charges $OT Visit: 1 Visit OT Evaluation $OT Eval Low Complexity: 1 Low OT Treatments $Self Care/Home Management : 38-52 mins  Kathie Dike, M.S. OTR/L  10/11/19, 5:35 PM  ascom [phone]

## 2019-10-11 NOTE — Progress Notes (Signed)
PROGRESS NOTE    Jodi Chavez  HOZ:[id number] DOB: December 08, 1950 DOA: 10/07/2019 PCP: Nolene Ebbs, MD   Brief Narrative: 69 year old with past medical history significant for diabetes, hypertension, hyperlipidemia, who received Covid vaccine varicella x2 presents with shortness of breath, cough.  Patient was tested positive for Covid on August 2.  Patient was able to see her PCP and she received a monoclonal antibody a few days prior to admission, but her symptoms continue to get worse for this reason she presented to the ED. Evaluation in the ED blood pressure elevated 199/100, troponins 290/270, D-dimer 1449.  Chest x-ray no acute disease. CTA: No evidence for pulmonary embolus. Patchy airspace opacities in the lower lobes bilaterally as well as the lingula compatible with multifocal airspace disease, likely viral pneumonia in the setting of COVID-19 infection. Coronary artery disease.   Assessment & Plan:   Principal Problem:   Breakthrough COVID-19 Active Problems:   Diabetes mellitus without complication (HCC)   Essential hypertension   Elevated troponin  1-Breakthrough COVID-19 infection, COVID-19 pneumonia: -CTA negative for PE.  Showed patchy airspace opacity in the lower lobes bilaterally as well as the lingula compatible with multifocal airspace disease. -Continue with Remdesivir. Day 5.  -Continue with albuterol, vitamin C. -Patient was already on prednisone will continue. -denies worsening dyspnea.   2-Elevated troponin: -EKG without ST segment elevation. -Patient with shortness of breath, differential myocarditis. -Cardiology consulted. No further cardiac evaluation.  -2D echo. Normal EF, no wall motion abnormalities.   3-Hypertension; uncontrolled Continue with Inderal, hydrochlorothiazide, clonidine, benazepril. Added Norvasc on 8/13  4-Diabetes type 2: Hold hold glimepiride, Metformin while inpatient. Continue with sliding scale insulin On lantus.    5-Increased anion gap: Resolved.  Repeat be met. CBG 182 range.,  CO2 27. Hold Metformin.  Nausea;  Continue with zofran PRN  Leukocytosis; secondary to steroids.   Estimated body mass index is 38.92 kg/m as calculated from the following:   Height as of this encounter: 5' 1"  (1.549 m).   Weight as of this encounter: 93.4 kg.   DVT prophylaxis: Lovenox Code Status: Full code Family Communication: Care discussed directly with patient Disposition Plan:  Status is: Inpatient  Dispo: The patient is from: Home              Anticipated d/c is to: SNF              Anticipated d/c date is: 3 days              Patient currently is not medically stable to d/c. Patient needs safe plan for discharge. She feels weak, plan to repeat PT evaluation. Patient has poor family support.      Consultants:   Cardiology   Procedures:   ECHO pending.   Antimicrobials:    Subjective: She feels she is improving, but feels weak., she doesn't feel strong enough to go home   Objective: Vitals:   10/11/19 0210 10/11/19 0420 10/11/19 0950 10/11/19 1205  BP: (!) 163/67 137/80 (!) 185/81 (!) 181/70  Pulse: 65 63 66 (!) 57  Resp: 16 16 17 18   Temp: 97.8 F (36.6 C) 97.6 F (36.4 C) 98.1 F (36.7 C) (!) 97.5 F (36.4 C)  TempSrc: Oral Oral Oral Oral  SpO2: 95% 96% 94% 96%  Weight:      Height:        Intake/Output Summary (Last 24 hours) at 10/11/2019 1255 Last data filed at 10/10/2019 1335 Gross per 24 hour  Intake 218.33 ml  Output --  Net 218.33 ml   Filed Weights   10/07/19 0006  Weight: 93.4 kg    Examination:  General exam:NAD Respiratory system: CTA Cardiovascular system: S 1, S 2 RRR Gastrointestinal system: BS present, soft, nt Central nervous system: Alert, following command  Extremities: Symmetric power.    Data Reviewed: I have personally reviewed following labs and imaging studies  CBC: Recent Labs  Lab 10/07/19 0010 10/08/19 0539 10/09/19 0516  10/10/19 0658 10/11/19 0655  WBC 16.4* 13.4* 16.4* 16.0* 18.4*  NEUTROABS 14.7* 10.8* 13.1* 12.7* 14.8*  HGB 16.4* 14.5 15.2* 14.8 15.1*  HCT 46.9* 40.4 42.2 42.7 42.3  MCV 92.5 91.6 89.8 93.0 89.6  PLT 320 263 291 275 678   Basic Metabolic Panel: Recent Labs  Lab 10/07/19 0010 10/08/19 0539 10/09/19 0516 10/10/19 0658 10/11/19 0655  NA 138 136 135 134* 133*  K 3.9 3.9 4.1 4.6 4.0  CL 95* 96* 95* 95* 93*  CO2 27 28 29 27 27   GLUCOSE 292* 168* 159* 154* 204*  BUN 31* 26* 23 29* 34*  CREATININE 1.26* 0.84 0.79 0.89 1.06*  CALCIUM 9.5 9.5 9.6 9.8 10.1  MG  --  1.1* 1.3* 1.9 1.8  PHOS  --  4.1 4.5 4.1 3.6   GFR: Estimated Creatinine Clearance: 52.9 mL/min (A) (by C-G formula based on SCr of 1.06 mg/dL (H)). Liver Function Tests: Recent Labs  Lab 10/07/19 0010 10/08/19 0539 10/09/19 0516 10/10/19 0658 10/11/19 0655  AST 49* 23 24 25 25   ALT 37 30 29 34 38  ALKPHOS 63 49 47 52 57  BILITOT 0.9 0.8 0.8 0.8 0.8  PROT 8.6* 7.1 7.2 6.9 7.1  ALBUMIN 4.4 3.7 3.7 3.6 3.7   Recent Labs  Lab 10/07/19 0010  LIPASE 47   No results for input(s): AMMONIA in the last 168 hours. Coagulation Profile: No results for input(s): INR, PROTIME in the last 168 hours. Cardiac Enzymes: No results for input(s): CKTOTAL, CKMB, CKMBINDEX, TROPONINI in the last 168 hours. BNP (last 3 results) No results for input(s): PROBNP in the last 8760 hours. HbA1C: No results for input(s): HGBA1C in the last 72 hours. CBG: Recent Labs  Lab 10/10/19 1324 10/10/19 1633 10/10/19 2058 10/11/19 0819 10/11/19 1244  GLUCAP 224* 297* 387* 173* 217*   Lipid Profile: No results for input(s): CHOL, HDL, LDLCALC, TRIG, CHOLHDL, LDLDIRECT in the last 72 hours. Thyroid Function Tests: No results for input(s): TSH, T4TOTAL, FREET4, T3FREE, THYROIDAB in the last 72 hours. Anemia Panel: Recent Labs    10/10/19 0658 10/11/19 0655  FERRITIN 257 248   Sepsis Labs: Recent Labs  Lab 10/07/19 0240    PROCALCITON <0.10    No results found for this or any previous visit (from the past 240 hour(s)).       Radiology Studies: No results found.      Scheduled Meds: . albuterol  2 puff Inhalation Q6H  . amLODipine  5 mg Oral Daily  . vitamin C  500 mg Oral Daily  . benazepril  40 mg Oral QPM  . cloNIDine  0.2 mg Oral BID  . enoxaparin (LOVENOX) injection  40 mg Subcutaneous Q24H  . ezetimibe  10 mg Oral QPC breakfast  . hydrochlorothiazide  25 mg Oral QPC breakfast  . insulin aspart  0-15 Units Subcutaneous TID WC  . insulin aspart  0-5 Units Subcutaneous QHS  . insulin glargine  15 Units Subcutaneous Daily  . magnesium oxide  200 mg Oral BID  .  multivitamin with minerals  1 tablet Oral Daily  . polyethylene glycol  17 g Oral Daily  . predniSONE  20 mg Oral Daily  . propranolol  20 mg Oral TID  . senna  1 tablet Oral Daily  . simvastatin  40 mg Oral QPM  . zinc sulfate  220 mg Oral Daily   Continuous Infusions: . sodium chloride 250 mL (10/11/19 0947)     LOS: 3 days    Time spent: 35 minutes.     [name], MD [facility]   If 7PM-7AM, please contact night-coverage [email or web address]  10/11/2019, 12:55 PM

## 2019-10-12 LAB — CBC WITH DIFFERENTIAL/PLATELET
Abs Immature Granulocytes: 0.41 10*3/uL — ABNORMAL HIGH (ref 0.00–0.07)
Basophils Absolute: 0 10*3/uL (ref 0.0–0.1)
Basophils Relative: 0 %
Eosinophils Absolute: 0 10*3/uL (ref 0.0–0.5)
Eosinophils Relative: 0 %
HCT: 41.1 % (ref 36.0–46.0)
Hemoglobin: 14.9 g/dL (ref 12.0–15.0)
Immature Granulocytes: 2 %
Lymphocytes Relative: 17 %
Lymphs Abs: 2.9 10*3/uL (ref 0.7–4.0)
MCH: 32.7 pg (ref 26.0–34.0)
MCHC: 36.3 g/dL — ABNORMAL HIGH (ref 30.0–36.0)
MCV: 90.3 fL (ref 80.0–100.0)
Monocytes Absolute: 1.3 10*3/uL — ABNORMAL HIGH (ref 0.1–1.0)
Monocytes Relative: 7 %
Neutro Abs: 12.7 10*3/uL — ABNORMAL HIGH (ref 1.7–7.7)
Neutrophils Relative %: 74 %
Platelets: 255 10*3/uL (ref 150–400)
RBC: 4.55 MIL/uL (ref 3.87–5.11)
RDW: 11.9 % (ref 11.5–15.5)
WBC: 17.3 10*3/uL — ABNORMAL HIGH (ref 4.0–10.5)
nRBC: 0 % (ref 0.0–0.2)

## 2019-10-12 LAB — COMPREHENSIVE METABOLIC PANEL
ALT: 40 U/L (ref 0–44)
AST: 25 U/L (ref 15–41)
Albumin: 3.5 g/dL (ref 3.5–5.0)
Alkaline Phosphatase: 53 U/L (ref 38–126)
Anion gap: 11 (ref 5–15)
BUN: 33 mg/dL — ABNORMAL HIGH (ref 8–23)
CO2: 26 mmol/L (ref 22–32)
Calcium: 9.8 mg/dL (ref 8.9–10.3)
Chloride: 93 mmol/L — ABNORMAL LOW (ref 98–111)
Creatinine, Ser: 0.89 mg/dL (ref 0.44–1.00)
GFR calc Af Amer: >60 mL/min (ref >60)
GFR calc non Af Amer: >60 mL/min (ref >60)
Glucose, Bld: 178 mg/dL — ABNORMAL HIGH (ref 70–99)
Potassium: 4.5 mmol/L (ref 3.5–5.1)
Sodium: 130 mmol/L — ABNORMAL LOW (ref 135–145)
Total Bilirubin: 0.9 mg/dL (ref 0.3–1.2)
Total Protein: 6.5 g/dL (ref 6.5–8.1)

## 2019-10-12 LAB — FIBRIN DERIVATIVES D-DIMER (ARMC ONLY): Fibrin derivatives D-dimer (ARMC): 530.16 ng/mL (FEU) — ABNORMAL HIGH (ref 0.00–499.00)

## 2019-10-12 LAB — GLUCOSE, CAPILLARY
Glucose-Capillary: 169 mg/dL — ABNORMAL HIGH (ref 70–99)
Glucose-Capillary: 230 mg/dL — ABNORMAL HIGH (ref 70–99)

## 2019-10-12 LAB — FERRITIN: Ferritin: 262 ng/mL (ref 11–307)

## 2019-10-12 LAB — MAGNESIUM: Magnesium: 1.9 mg/dL (ref 1.7–2.4)

## 2019-10-12 LAB — PHOSPHORUS: Phosphorus: 3.8 mg/dL (ref 2.5–4.6)

## 2019-10-12 LAB — C-REACTIVE PROTEIN: CRP: 0.8 mg/dL (ref <1.0)

## 2019-10-12 MED ORDER — AMLODIPINE BESYLATE 5 MG PO TABS: 5.0000 mg | ORAL_TABLET | Freq: Every day | ORAL | 0 refills | Status: DC

## 2019-10-12 MED ORDER — BISACODYL 10 MG RE SUPP
10.0000 mg | Freq: Once | RECTAL | Status: AC
  Administered 2019-10-12: 13:00:00 10 mg via RECTAL

## 2019-10-12 MED ORDER — ZINC SULFATE 220 (50 ZN) MG PO CAPS: 220.0000 mg | ORAL_CAPSULE | Freq: Every day | ORAL | 0 refills | Status: DC

## 2019-10-12 MED ORDER — ONDANSETRON HCL 4 MG PO TABS: 4.0000 mg | ORAL_TABLET | Freq: Four times a day (QID) | ORAL | 0 refills | Status: DC | PRN

## 2019-10-12 MED ORDER — POLYETHYLENE GLYCOL 3350 17 G PO PACK: 17.0000 g | PACK | Freq: Every day | ORAL | 0 refills | Status: DC

## 2019-10-12 MED ORDER — MAGNESIUM OXIDE 400 (241.3 MG) MG PO TABS: 200.0000 mg | ORAL_TABLET | Freq: Two times a day (BID) | ORAL | 0 refills | Status: DC

## 2019-10-12 MED ORDER — ASCORBIC ACID 500 MG PO TABS: 500.0000 mg | ORAL_TABLET | Freq: Every day | ORAL | 0 refills | Status: DC

## 2019-10-12 NOTE — Progress Notes (Signed)
Physical Therapy Treatment Patient Details Name: Jodi Chavez MRN: [id number] DOB: Jan 31, 1951 Today's Date: 10/12/2019    History of Present Illness presented to ER secondary to progressive cough, SOB; admitted for management of breakthrough COVID-19. Noted with elevated troponin, likely demand ischemia per cardiology.    PT Comments    Patient alert and oriented; agreeable to upcoming discharge, but states "my sister wanted to make sure I saw therapy first before I left".  Able to complete bed mobility, sit/stand, basic transfers and in-room ambulation with RW, close sup, demonstrating functional improvement (less physical assist, increased activity tolerance) from initial evaluation.  Gait demonstrates reciprocal stepping; mild trendelenburg with slight vaulting over R LE at times.  Forward flexed posture, RW arms length anterior to patient.  Slow and cautious gait, but no overt buckling or LOB.  Manages turns, obstacles without difficulty. Remains appropriate for home with [facility] therapy upon discharge; patient voices agreement and understanding.    Follow Up Recommendations  Home health PT     Equipment Recommendations  Rolling walker with 5" wheels;3in1 (PT)    Recommendations for Other Services       Precautions / Restrictions Precautions Precautions: Fall Restrictions Weight Bearing Restrictions: No    Mobility  Bed Mobility   Bed Mobility: Supine to Sit;Sit to Supine     Supine to sit: Modified independent (Device/Increase time) Sit to supine: Min assist      Transfers Overall transfer level: Needs assistance Equipment used: Rolling walker (2 wheeled) Transfers: Sit to/from Stand Sit to Stand: Supervision         General transfer comment: good hand placement and overall safety awareness with transfer  Ambulation/Gait Ambulation/Gait assistance: Supervision Gait Distance (Feet): 75 Feet Assistive device: Rolling walker (2 wheeled)       General Gait  Details: reciprocal stepping; mild trendelenburg with slight vaulting over R LE at times.  Forward flexed posture, RW arms length anterior to patient.  Slow and cautious gait, but no overt buckling or LOB.  Manages turns, obstacles without difficulty.   Stairs             Wheelchair Mobility    Modified Rankin (Stroke Patients Only)       Balance Overall balance assessment: Needs assistance Sitting-balance support: No upper extremity supported;Feet supported Sitting balance-Leahy Scale: Good     Standing balance support: Bilateral upper extremity supported Standing balance-Leahy Scale: Fair                              Cognition Arousal/Alertness: Awake/alert Behavior During Therapy: WFL for tasks assessed/performed Overall Cognitive Status: Within Functional Limits for tasks assessed                                 General Comments: hyperverbal, frequent redirection to task      Exercises Other Exercises Other Exercises: Sit/stand with RW, close sup-good hand placement; mild use of momentum for lift off Other Exercises: Reviewed activity pacing, energy conservation strategies upon discharge home; patient voiced understanding of information.  Generally hyperverbal throughout session, frequent redirection to task/conversation at hand required.    General Comments        Pertinent Vitals/Pain Pain Assessment: No/denies pain    Home Living                      Prior Function  PT Goals (current goals can now be found in the care plan section) Acute Rehab PT Goals Patient Stated Goal: to get better PT Goal Formulation: With patient Time For Goal Achievement: 10/23/19 Potential to Achieve Goals: Good Progress towards PT goals: Progressing toward goals    Frequency    Min 2X/week      PT Plan Current plan remains appropriate    Co-evaluation              AM-PAC PT "6 Clicks" Mobility   Outcome  Measure  Help needed turning from your back to your side while in a flat bed without using bedrails?: None Help needed moving from lying on your back to sitting on the side of a flat bed without using bedrails?: None Help needed moving to and from a bed to a chair (including a wheelchair)?: None Help needed standing up from a chair using your arms (e.g., wheelchair or bedside chair)?: None Help needed to walk in hospital room?: None Help needed climbing 3-5 steps with a railing? : A Little 6 Click Score: 23    End of Session Equipment Utilized During Treatment: Gait belt Activity Tolerance: Patient tolerated treatment well Patient left: in bed;with call bell/phone within reach Nurse Communication: Mobility status PT Visit Diagnosis: Muscle weakness (generalized) (M62.81);Difficulty in walking, not elsewhere classified (R26.2)     Time: 8366-2947 PT Time Calculation (min) (ACUTE ONLY): 38 min  Charges:  $Gait Training: 8-22 mins $Therapeutic Activity: 23-37 mins                     Braiden Rodman H. Manson Passey, PT, DPT, NCS 10/12/19, 3:36 PM [phone]

## 2019-10-12 NOTE — TOC Initial Note (Signed)
Transition of Care [facility]) - Initial/Assessment Note    Patient Details  Name: Jodi Chavez MRN: [id number] Date of Birth: 01-11-1951  Transition of Care [facility]) CM/SW Contact:    Allayne Butcher, RN Phone Number: 10/12/2019, 9:53 AM  Clinical Narrative:                 Patient is from home where she lives with her sister and nephew.  Patient tested positive for COVID on 8/5 and was admitted on 8/10.  Patient will return home with home health services through [facility].  Referral for home health services given to [facility] with [facility], the patient wanted to use the same agency that her sister is using.  Patient's address is listed as [address] but she has been living with her sister, Liborio Nixon, for the past 5 years at [address] [place] [address].  Patient does still get her mail at the [facility] address.  Patient's nephew will come and pick her up this afternoon.  MD reports that patient is stable for discharge today.  Patient has a walker at home and reports that she does not need a bedside commode.    Expected Discharge Plan: Home w Home Health Services Barriers to Discharge: Barriers Resolved   Patient Goals and CMS Choice Patient states their goals for this hospitalization and ongoing recovery are:: Patient is good with going home today back to her sisters CMS Medicare.gov Compare Post Acute Care list provided to:: Patient Choice offered to / list presented to : Patient  Expected Discharge Plan and Services Expected Discharge Plan: Home w Home Health Services   Discharge Planning Services: CM Consult Post Acute Care Choice: Home Health Living arrangements for the past 2 months: Single Family Home Expected Discharge Date: 10/12/19                         HH Arranged: RN, PT, OT, Nurse's Aide, Social Work [employer] Agency: [facility] ([facility] Date HH Agency Contacted: 10/12/19 Time HH Agency Contacted: [phone] Representative spoke with at [facility] Agency: Feliberto Gottron  Prior Living Arrangements/Services Living arrangements for the past 2 months: Single Family Home Lives with:: Siblings (sister and nephew) Patient language and need for interpreter reviewed:: Yes Do you feel safe going back to the place where you live?: Yes      Need for Family Participation in Patient Care: Yes (Comment) (COVID) Care giver support system in place?: Yes (comment) (sister and nephew) Current home services: DME (walker) Criminal Activity/Legal Involvement Pertinent to Current Situation/Hospitalization: No - Comment as needed  Activities of Daily Living Home Assistive Devices/Equipment: Environmental consultant (specify type) ADL Screening (condition at time of admission) Patient's cognitive ability adequate to safely complete daily activities?: Yes Is the patient deaf or have difficulty hearing?: No Does the patient have difficulty seeing, even when wearing glasses/contacts?: No Does the patient have difficulty concentrating, remembering, or making decisions?: No Patient able to express need for assistance with ADLs?: Yes Does the patient have difficulty dressing or bathing?: No Independently performs ADLs?: Yes (appropriate for developmental age) Does the patient have difficulty walking or climbing stairs?: Yes Weakness of Legs: Both Weakness of Arms/Hands: None  Permission Sought/Granted Permission sought to share information with : Case Manager, Family Supports, Other (comment) Permission granted to share information with : Yes, Verbal Permission Granted  Share Information with NAME: Harvel Quale  Permission granted to share info w AGENCY: [facility]  Permission granted to  share info w Relationship: sister     Emotional Assessment   Attitude/Demeanor/Rapport: Engaged Affect (typically observed): Accepting Orientation: : Oriented to Self, Oriented to Place, Oriented to  Time, Oriented to Situation Alcohol / Substance Use: Not Applicable Psych Involvement: No  (comment)  Admission diagnosis:  Elevated troponin [R77.8] 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 Breakthrough COVID-19 10/07/2019  . Elevated troponin 10/07/2019  . HLD (hyperlipidemia) 11/21/2014  . Depression 11/21/2014  . Nausea & vomiting 11/21/2014  . Sepsis (HCC) 11/21/2014  . Coughing 11/21/2014  . UTI (lower urinary tract infection) 11/21/2014  . AKI (acute kidney injury) (HCC) 11/21/2014  . Nausea with vomiting   . 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 HNP (herniated nucleus pulposus), cervical 10/15/2011    Class: Chronic  . Diabetes mellitus without complication (HCC) 09/30/2006  . Essential hypertension 09/30/2006  . DILATION AND CURETTAGE, HX OF 09/30/2006   PCP:  Fleet Contras, MD Pharmacy:   [facility] [address], [place] - [address] [place] [address] Phone: [phone] Fax: [phone]     Social Determinants of Health (SDOH) Interventions    Readmission Risk Interventions No flowsheet data found.

## 2019-10-12 NOTE — Care Management Important Message (Signed)
Important Message  Patient Details  Name: Jodi Chavez MRN: [id number] Date of Birth: 1950/03/27   Medicare Important Message Given:  Yes  IM was given to CM to distribute as patient was on Isolation.     Bernadette Hoit 10/12/2019, 11:18 AM

## 2019-10-12 NOTE — Discharge Summary (Signed)
Physician Discharge Summary  Flornce Record Lecker MPN:[id number] DOB: 04-Dec-1950 DOA: 10/07/2019  PCP: Nolene Ebbs, MD  Admit date: 10/07/2019 Discharge date: 10/12/2019  Admitted From: Home  Disposition: Home   Recommendations for Outpatient Follow-up:  1. Follow up with PCP in 1-2 weeks 2. Please obtain BMP/CBC in one week 3. Follow up with PCP for post covid Diagnosis.   Home Health: Yes, HH PT, OT, RN, Aid.   Discharge Condition: Stable.  CODE STATUS: Full Code Diet recommendation: Heart Healthy   Brief/Interim Summary: 69 year old with past medical history significant for diabetes, hypertension, hyperlipidemia, who received Covid vaccine varicella x2 presents with shortness of breath, cough.  Patient was tested positive for Covid on August 2.  Patient was able to see her PCP and she received a monoclonal antibody a few days prior to admission, but her symptoms continue to get worse for this reason she presented to the ED. Evaluation in the ED blood pressure elevated 199/100, troponins 290/270, D-dimer 1449.  Chest x-ray no acute disease. CTA: No evidence for pulmonary embolus. Patchy airspace opacities in the lower lobes bilaterally as well as the lingula compatible with multifocal airspace disease, likely viral pneumonia in the setting of COVID-19 infection. Coronary artery disease.  1-Breakthrough COVID-19 infection, COVID-19 pneumonia: -CTA negative for PE.  Showed patchy airspace opacity in the lower lobes bilaterally as well as the lingula compatible with multifocal airspace disease. -Continue with Remdesivir. Day 5.  -Continue with albuterol, vitamin C. -Patient was already on prednisone will continue. -Stable for discharge.   2-Elevated troponin: -EKG without ST segment elevation. -Patient with shortness of breath, differential myocarditis. -Cardiology consulted. No further cardiac evaluation.  -2D echo. Normal EF, no wall motion abnormalities.   3-Hypertension;  uncontrolled Continue with Inderal, hydrochlorothiazide, clonidine, benazepril. Added Norvasc on 8/13  4-Diabetes type 2: Resume home meds;  glimepiride, Metformin while inpatient. Continue with sliding scale insulin   5-Increased anion gap: Resolved.  Repeat B-met.  CBG 182 range., CO2 27. Hold Metformin.  Nausea;  Continue with zofran PRN Improved.   Leukocytosis; secondary to steroids.    Discharge Diagnoses:  Principal Problem:   Breakthrough COVID-19 Active Problems:   Diabetes mellitus without complication [facility])   Essential hypertension   Elevated troponin    Discharge Instructions  Discharge Instructions    Diet - low sodium heart healthy   Complete by: As directed    Increase activity slowly   Complete by: As directed      Allergies as of 10/12/2019      Reactions   Naproxen Sodium Rash   anaprox      Medication List    STOP taking these medications   doxycycline 100 MG capsule Commonly known as: VIBRAMYCIN   predniSONE 20 MG tablet Commonly known as: DELTASONE     TAKE these medications   amLODipine 5 MG tablet Commonly known as: NORVASC Take 1 tablet (5 mg total) by mouth daily. Start taking on: October 13, 2019   ascorbic acid 500 MG tablet Commonly known as: VITAMIN C Take 1 tablet (500 mg total) by mouth daily. Start taking on: October 13, 2019   benazepril 40 MG tablet Commonly known as: LOTENSIN Take 40 mg by mouth every evening.   calcium-vitamin D 500-200 MG-UNIT tablet Commonly known as: OSCAL WITH D Take 1 tablet by mouth daily after breakfast.   cetirizine 10 MG tablet Commonly known as: ZYRTEC Take 10 mg by mouth daily as needed.   cloNIDine 0.2 MG tablet Commonly known  as: CATAPRES Take 0.2 mg by mouth 2 (two) times daily.   colchicine 0.6 MG tablet Take 0.6 mg by mouth daily.   diphenhydrAMINE 25 MG tablet Commonly known as: BENADRYL Take 1 tablet (25 mg total) by mouth every 6 (six) hours as needed for  itching.   ezetimibe 10 MG tablet Commonly known as: ZETIA Take 10 mg by mouth daily after breakfast.   fluticasone 50 MCG/ACT nasal spray Commonly known as: FLONASE Place 2 sprays into both nostrils daily.   furosemide 20 MG tablet Commonly known as: LASIX TAKE 1 TABLET BY MOUTH ONCE DAILY AS NEEDED FOR LEG SWELLING   gabapentin 300 MG capsule Commonly known as: NEURONTIN Take 300 mg by mouth 3 (three) times daily.   Garlic 2671 MG Caps Take 5,000 mg by mouth daily after breakfast.   glimepiride 2 MG tablet Commonly known as: AMARYL Take 2 mg by mouth daily before breakfast.   GLUCOSAMINE CHONDR 1500 COMPLX PO Take 1 tablet by mouth daily after breakfast.   hydrochlorothiazide 25 MG tablet Commonly known as: HYDRODIURIL Take 25 mg by mouth daily after breakfast.   magnesium oxide 400 (241.3 Mg) MG tablet Commonly known as: MAG-OX Take 0.5 tablets (200 mg total) by mouth 2 (two) times daily.   metFORMIN 850 MG tablet Commonly known as: GLUCOPHAGE Take 850 mg by mouth daily with breakfast.   multivitamin with minerals Tabs tablet Take 1 tablet by mouth daily after breakfast.   nabumetone 500 MG tablet Commonly known as: RELAFEN TAKE 1 TABLET BY MOUTH 2-3 TIMES DAILY AS NEEDED FOR INFLAMMATION   ondansetron 4 MG tablet Commonly known as: ZOFRAN Take 1 tablet (4 mg total) by mouth every 6 (six) hours as needed for nausea.   OVER THE COUNTER MEDICATION Apply 1 application topically daily. sween cream applied to groin area daily to prevent rash   pantoprazole 40 MG tablet Commonly known as: PROTONIX Take 1 tablet (40 mg total) by mouth daily.   polyethylene glycol 17 g packet Commonly known as: MIRALAX / GLYCOLAX Take 17 g by mouth daily. Start taking on: October 13, 2019   ProAir HFA 108 (90 Base) MCG/ACT inhaler Generic drug: albuterol Inhale 2 puffs into the lungs every 4 (four) hours as needed for wheezing or shortness of breath.   promethazine 25 MG  tablet Commonly known as: PHENERGAN Take 1 tablet (25 mg total) by mouth every 6 (six) hours as needed for nausea.   propranolol 20 MG tablet Commonly known as: INDERAL Take 20 mg by mouth 3 (three) times daily.   Sennosides 25 MG Tabs Take 2 tablets by mouth daily as needed (constipation). Pt takes every other day to every 3 days   simvastatin 40 MG tablet Commonly known as: ZOCOR Take 40 mg by mouth every evening.   traMADol 50 MG tablet Commonly known as: ULTRAM TAKE ONE-HALF TO ONE TABLET BY MOUTH EVERY 4 TO 6 HOURS AS NEEDED FOR PAIN   zinc sulfate 220 (50 Zn) MG capsule Take 1 capsule (220 mg total) by mouth daily. Start taking on: October 13, 2019       Follow-up Information    Corey Skains, MD Follow up in 1 month(s).   Specialty: Cardiology Contact information: [facility] [place] [address] [phone]              Allergies  Allergen Reactions  . Naproxen Sodium Rash    anaprox    Consultations:  None  Procedures/Studies: CT Angio Chest PE W and/or Wo Contrast  Result Date: 10/07/2019 CLINICAL DATA:  PE suspected.  High probability.  COVID positive. EXAM: CT ANGIOGRAPHY CHEST WITH CONTRAST TECHNIQUE: Multidetector CT imaging of the chest was performed using the standard protocol during bolus administration of intravenous contrast. Multiplanar CT image reconstructions and MIPs were obtained to evaluate the vascular anatomy. CONTRAST:  28m OMNIPAQUE IOHEXOL 350 MG/ML SOLN COMPARISON:  One-view chest x-ray 10/07/2019 FINDINGS: Cardiovascular: Heart size is normal. Extensive coronary artery calcifications are present. Atherosclerotic changes are present in the aorta and at the origins the great vessels without significant stenosis. No aneurysm is present. Pulmonary artery opacification is excellent. No focal filling defects are present to suggest pulmonary embolus. Mediastinum/Nodes: No significant  mediastinal, hilar, or axillary adenopathy is present. Thoracic inlet is within normal limits. Esophagus is normal. Lungs/Pleura: Patchy airspace opacities are present in the lower lobes bilaterally as well as the lingula. No significant consolidation is present. No significant pleural disease is present. Upper Abdomen: Hyperdense gallstones are present. No inflammatory changes are present in the gallbladder. Visualized liver is within normal limits. Upper abdomen is otherwise unremarkable. Musculoskeletal: Multilevel degenerative changes are present in the thoracic spine. Vacuum disc is present at T12-L1. Vertebral body heights are maintained. No acute or healing fractures are present. Ribs are unremarkable. No focal lytic or blastic lesions are present. Review of the MIP images confirms the above findings. IMPRESSION: 1. No evidence for pulmonary embolus. 2. Patchy airspace opacities in the lower lobes bilaterally as well as the lingula compatible with multifocal airspace disease, likely viral pneumonia in the setting of COVID-19 infection. 3. Coronary artery disease. 4. Cholelithiasis without evidence for cholecystitis. 5. Aortic Atherosclerosis (ICD10-I70.0). Electronically Signed   By: CSan MorelleM.D.   On: 10/07/2019 04:16   DG Chest Portable 1 View  Result Date: 10/07/2019 CLINICAL DATA:  COVID positive EXAM: PORTABLE CHEST 1 VIEW COMPARISON:  None. FINDINGS: The heart size and mediastinal contours are within normal limits. Both lungs are clear. The visualized skeletal structures are unremarkable. IMPRESSION: No active disease. Electronically Signed   By: BPrudencio PairM.D.   On: 10/07/2019 02:25   ECHOCARDIOGRAM COMPLETE  Result Date: 10/08/2019    ECHOCARDIOGRAM REPORT   Patient Name:   Jodi ROTONDODate of Exam: 10/07/2019 Medical Rec #:  [id number]     Height:       61.0 in Accession #:    [id number]    Weight:       206.0 lb Date of Birth:  [birth year]     BSA:          1.913 m Patient  Age:    89years       BP:           162/81 mmHg Patient Gender: F              HR:           82 bpm. Exam Location:  [facility] Procedure: 2D Echo, Cardiac Doppler and Color Doppler Indications:     Elevated troponin  History:         Patient has no prior history of Echocardiogram examinations.                  Risk Factors:Hypertension, Diabetes and Dyslipidemia.  Sonographer:     JSherrie SportRDCS (AE) Referring Phys:  [id number] BElmarie ShileyDiagnosing Phys: BSerafina RoyalsMD  Sonographer Comments: Technically challenging study due  to limited acoustic windows and suboptimal apical window. IMPRESSIONS  1. Left ventricular ejection fraction, by estimation, is 50 to 55%. The left ventricle has low normal function. The left ventricle has no regional wall motion abnormalities. Left ventricular diastolic parameters were normal.  2. Right ventricular systolic function is normal. The right ventricular size is normal. There is normal pulmonary artery systolic pressure.  3. The mitral valve is normal in structure. Mild mitral valve regurgitation.  4. The aortic valve is normal in structure. Aortic valve regurgitation is not visualized. FINDINGS  Left Ventricle: Left ventricular ejection fraction, by estimation, is 50 to 55%. The left ventricle has low normal function. The left ventricle has no regional wall motion abnormalities. The left ventricular internal cavity size was normal in size. There is no left ventricular hypertrophy. Left ventricular diastolic parameters were normal. Right Ventricle: The right ventricular size is normal. No increase in right ventricular wall thickness. Right ventricular systolic function is normal. There is normal pulmonary artery systolic pressure. The tricuspid regurgitant velocity is 1.36 m/s, and  with an assumed right atrial pressure of 10 mmHg, the estimated right ventricular systolic pressure is 89.3 mmHg. Left Atrium: Left atrial size was normal in size. Right Atrium: Right atrial size was  normal in size. Pericardium: There is no evidence of pericardial effusion. Mitral Valve: The mitral valve is normal in structure. Mild mitral valve regurgitation. Tricuspid Valve: The tricuspid valve is normal in structure. Tricuspid valve regurgitation is trivial. Aortic Valve: The aortic valve is normal in structure. Aortic valve regurgitation is not visualized. Aortic valve mean gradient measures 2.0 mmHg. Aortic valve peak gradient measures 2.9 mmHg. Aortic valve area, by VTI measures 2.55 cm. Pulmonic Valve: The pulmonic valve was normal in structure. Pulmonic valve regurgitation is not visualized. Aorta: The aortic root and ascending aorta are structurally normal, with no evidence of dilitation. IAS/Shunts: No atrial level shunt detected by color flow Doppler.  LEFT VENTRICLE PLAX 2D LVIDd:         2.63 cm  Diastology LVIDs:         1.69 cm  LV e' lateral:   5.44 cm/s LV PW:         1.36 cm  LV E/e' lateral: 0.0 LV IVS:        1.02 cm  LV e' medial:    4.90 cm/s LVOT diam:     1.90 cm  LV E/e' medial:  0.0 LV SV:         56 LV SV Index:   29 LVOT Area:     2.84 cm  RIGHT VENTRICLE RV S prime:     14.40 cm/s LEFT ATRIUM         Index LA diam:    3.80 cm 1.99 cm/m  AORTIC VALVE                   PULMONIC VALVE AV Area (Vmax):    2.25 cm    PV Vmax:        0.84 m/s AV Area (Vmean):   2.18 cm    PV Peak grad:   2.8 mmHg AV Area (VTI):     2.55 cm    RVOT Peak grad: 4 mmHg AV Vmax:           84.60 cm/s AV Vmean:          63.500 cm/s AV VTI:            0.219 m AV Peak Grad:  2.9 mmHg AV Mean Grad:      2.0 mmHg LVOT Vmax:         67.20 cm/s LVOT Vmean:        48.800 cm/s LVOT VTI:          0.197 m LVOT/AV VTI ratio: 0.90  AORTA Ao Root diam: 2.20 cm MITRAL VALVE                TRICUSPID VALVE MV Area (PHT): 2.87 cm     TR Peak grad:   7.4 mmHg MV Decel Time: 264 msec     TR Vmax:        136.00 cm/s MV E velocity: 0.00 cm/s MV A velocity: 130.00 cm/s  SHUNTS MV E/A ratio:  0.00         Systemic VTI:  0.20  m                             Systemic Diam: 1.90 cm Serafina Royals MD Electronically signed by Serafina Royals MD Signature Date/Time: 10/08/2019/8:56:49 AM    Final       Subjective: Report feeling better. Tolerating diet. Nausea improved.   Discharge Exam: Vitals:   10/12/19 0756 10/12/19 0829  BP: (!) 154/77   Pulse: (!) 59 (!) 54  Resp: 12   Temp: 97.6 F (36.4 C)   SpO2: 97%      General: Pt is alert, awake, not in acute distress Cardiovascular: RRR, S1/S2 +, no rubs, no gallops Respiratory: CTA bilaterally, no wheezing, no rhonchi Abdominal: Soft, NT, ND, bowel sounds + Extremities: no edema, no cyanosis    The results of significant diagnostics from this hospitalization (including imaging, microbiology, ancillary and laboratory) are listed below for reference.     Microbiology: No results found for this or any previous visit (from the past 240 hour(s)).   Labs: BNP (last 3 results) Recent Labs    10/07/19 0010  BNP 161.0*   Basic Metabolic Panel: Recent Labs  Lab 10/08/19 0539 10/09/19 0516 10/10/19 0658 10/11/19 0655 10/12/19 0618  NA 136 135 134* 133* 130*  K 3.9 4.1 4.6 4.0 4.5  CL 96* 95* 95* 93* 93*  CO2 28 29 27 27 26   GLUCOSE 168* 159* 154* 204* 178*  BUN 26* 23 29* 34* 33*  CREATININE 0.84 0.79 0.89 1.06* 0.89  CALCIUM 9.5 9.6 9.8 10.1 9.8  MG 1.1* 1.3* 1.9 1.8 1.9  PHOS 4.1 4.5 4.1 3.6 3.8   Liver Function Tests: Recent Labs  Lab 10/08/19 0539 10/09/19 0516 10/10/19 0658 10/11/19 0655 10/12/19 0618  AST 23 24 25 25 25   ALT 30 29 34 38 40  ALKPHOS 49 47 52 57 53  BILITOT 0.8 0.8 0.8 0.8 0.9  PROT 7.1 7.2 6.9 7.1 6.5  ALBUMIN 3.7 3.7 3.6 3.7 3.5   Recent Labs  Lab 10/07/19 0010  LIPASE 47   No results for input(s): AMMONIA in the last 168 hours. CBC: Recent Labs  Lab 10/08/19 0539 10/09/19 0516 10/10/19 0658 10/11/19 0655 10/12/19 0618  WBC 13.4* 16.4* 16.0* 18.4* 17.3*  NEUTROABS 10.8* 13.1* 12.7* 14.8* 12.7*   HGB 14.5 15.2* 14.8 15.1* 14.9  HCT 40.4 42.2 42.7 42.3 41.1  MCV 91.6 89.8 93.0 89.6 90.3  PLT 263 291 275 299 255   Cardiac Enzymes: No results for input(s): CKTOTAL, CKMB, CKMBINDEX, TROPONINI in the last 168 hours. BNP: Invalid input(s): POCBNP CBG: Recent Labs  Lab  10/11/19 0819 10/11/19 1244 10/11/19 1701 10/11/19 2105 10/12/19 0758  GLUCAP 173* 217* 328* 309* 169*   D-Dimer No results for input(s): DDIMER in the last 72 hours. Hgb A1c No results for input(s): HGBA1C in the last 72 hours. Lipid Profile No results for input(s): CHOL, HDL, LDLCALC, TRIG, CHOLHDL, LDLDIRECT in the last 72 hours. Thyroid function studies No results for input(s): TSH, T4TOTAL, T3FREE, THYROIDAB in the last 72 hours.  Invalid input(s): FREET3 Anemia work up Recent Labs    10/11/19 0655 10/12/19 0618  FERRITIN 248 262   Urinalysis    Component Value Date/Time   COLORURINE YELLOW (A) 10/07/2019 1610   APPEARANCEUR HAZY (A) 10/07/2019 1610   LABSPEC 1.021 10/07/2019 1610   PHURINE 6.0 10/07/2019 1610   GLUCOSEU NEGATIVE 10/07/2019 1610   HGBUR NEGATIVE 10/07/2019 1610   BILIRUBINUR NEGATIVE 10/07/2019 1610   KETONESUR NEGATIVE 10/07/2019 1610   PROTEINUR NEGATIVE 10/07/2019 1610   UROBILINOGEN 0.2 11/21/2014 1835   NITRITE NEGATIVE 10/07/2019 1610   LEUKOCYTESUR TRACE (A) 10/07/2019 1610   Sepsis Labs Invalid input(s): PROCALCITONIN,  WBC,  LACTICIDVEN Microbiology No results found for this or any previous visit (from the past 240 hour(s)).   Time coordinating discharge: 40 minutes  SIGNED:   [name], MD  [facility]

## 2019-12-04 DIAGNOSIS — I700 Atherosclerosis of aorta: Secondary | ICD-10-CM | POA: Insufficient documentation

## 2019-12-30 ENCOUNTER — Ambulatory Visit: Payer: Medicare Other | Admitting: Podiatry

## 2020-01-11 ENCOUNTER — Other Ambulatory Visit: Payer: Self-pay

## 2020-01-11 ENCOUNTER — Encounter: Payer: Self-pay | Admitting: Podiatry

## 2020-01-11 ENCOUNTER — Ambulatory Visit (INDEPENDENT_AMBULATORY_CARE_PROVIDER_SITE_OTHER): Payer: Medicare Other | Admitting: Podiatry

## 2020-01-11 DIAGNOSIS — M79675 Pain in left toe(s): Secondary | ICD-10-CM

## 2020-01-11 DIAGNOSIS — E1140 Type 2 diabetes mellitus with diabetic neuropathy, unspecified: Secondary | ICD-10-CM | POA: Diagnosis not present

## 2020-01-11 DIAGNOSIS — E1149 Type 2 diabetes mellitus with other diabetic neurological complication: Secondary | ICD-10-CM | POA: Diagnosis not present

## 2020-01-11 DIAGNOSIS — M79674 Pain in right toe(s): Secondary | ICD-10-CM

## 2020-01-11 DIAGNOSIS — B351 Tinea unguium: Secondary | ICD-10-CM | POA: Diagnosis not present

## 2020-01-11 NOTE — Progress Notes (Signed)
This patient returns to my office for at risk foot care.  This patient requires this care by a professional since this patient will be at risk due to having diabetes.    This patient is unable to cut nails herself since the patient cannot reach her nails.These nails are painful walking and wearing shoes.  This patient presents for at risk foot care today.  General Appearance  Alert, conversant and in no acute stress.  Vascular  Dorsalis pedis and posterior tibial  pulses are not  palpable due to swelling bilaterally.  Capillary return is within normal limits  bilaterally. Temperature is within normal limits  bilaterally.  Neurologic  Senn-Weinstein monofilament wire test within normal limits  bilaterally. Muscle power within normal limits bilaterally.  Nails Thick disfigured discolored nails with subungual debris  3-5 left and 2-5 right.. No evidence of bacterial infection or drainage bilaterally.  Orthopedic  No limitations of motion  feet .  No crepitus or effusions noted.  No bony pathology or digital deformities noted.  Skin  normotropic skin with no porokeratosis noted bilaterally.  No signs of infections or ulcers noted.     Onychomycosis  Pain in right toes  Pain in left toes  Consent was obtained for treatment procedures.   Mechanical debridement of nails 1-5  bilaterally performed with a nail nipper.  Filed with dremel without incident.    Return office visit   3 months                   Told patient to return for periodic foot care and evaluation due to potential at risk complications.   Velvet Moomaw DPM  

## 2020-02-01 ENCOUNTER — Ambulatory Visit (INDEPENDENT_AMBULATORY_CARE_PROVIDER_SITE_OTHER): Payer: Medicare Other | Admitting: Ophthalmology

## 2020-02-01 ENCOUNTER — Encounter (INDEPENDENT_AMBULATORY_CARE_PROVIDER_SITE_OTHER): Payer: Self-pay | Admitting: Ophthalmology

## 2020-02-01 ENCOUNTER — Other Ambulatory Visit: Payer: Self-pay

## 2020-02-01 DIAGNOSIS — H43812 Vitreous degeneration, left eye: Secondary | ICD-10-CM

## 2020-02-01 DIAGNOSIS — H25042 Posterior subcapsular polar age-related cataract, left eye: Secondary | ICD-10-CM | POA: Insufficient documentation

## 2020-02-01 DIAGNOSIS — H43811 Vitreous degeneration, right eye: Secondary | ICD-10-CM

## 2020-02-01 DIAGNOSIS — H25041 Posterior subcapsular polar age-related cataract, right eye: Secondary | ICD-10-CM

## 2020-02-01 DIAGNOSIS — E119 Type 2 diabetes mellitus without complications: Secondary | ICD-10-CM | POA: Diagnosis not present

## 2020-02-01 DIAGNOSIS — H2513 Age-related nuclear cataract, bilateral: Secondary | ICD-10-CM

## 2020-02-01 NOTE — Assessment & Plan Note (Signed)
The nature of cataract was discussed with the patient as well as the elective nature of surgery. The patient was reassured that surgery at a later date does not put the patient at risk for a worse outcome. It was emphasized that the need for surgery is dictated by the patient's quality of life as influenced by the cataract. Patient was instructed to maintain close follow up with their general eye care doctor. , May account for acuity.

## 2020-02-01 NOTE — Assessment & Plan Note (Signed)

## 2020-02-01 NOTE — Progress Notes (Signed)
02/01/2020     CHIEF COMPLAINT Patient presents for Diabetic Eye Exam   HISTORY OF PRESENT ILLNESS: Jodi Chavez is a 69 y.o. female who presents to the clinic today for:   HPI    Diabetic Eye Exam    Vision is stable.  Associated Symptoms Floaters and Flashes.  Diabetes characteristics include Type 2.  Blood sugar level is controlled.  Last Blood Glucose 125.  Last A1C 6.8.  I, the attending physician,  performed the HPI with the patient and updated documentation appropriately.          Comments    1 Year Diabetic Exam OU. OCT  Pt states vision has been stable. Pt sees occasional floaters and FOL but states this is normal.        Last edited by Elyse Jarvis on 02/01/2020  2:12 PM. (History)      Referring physician: Fleet Contras, MD [address] [place],  [place] [address]  HISTORICAL INFORMATION:   Selected notes from the MEDICAL RECORD NUMBER    Lab Results  Component Value Date   HGBA1C 7.0 (H) 11/22/2014     CURRENT MEDICATIONS: No current outpatient medications on file. (Ophthalmic Drugs)   No current facility-administered medications for this visit. (Ophthalmic Drugs)   Current Outpatient Medications (Other)  Medication Sig  . amLODipine (NORVASC) 5 MG tablet Take 1 tablet by mouth daily.  Marland Kitchen ascorbic acid (VITAMIN C) 500 MG tablet Take 1 tablet by mouth daily.  . benazepril (LOTENSIN) 40 MG tablet Take by mouth.  . calcium-vitamin D (OSCAL WITH D) 500-200 MG-UNIT TABS tablet Take 1 tablet by mouth daily.  . cetirizine (ZYRTEC) 10 MG tablet Take 10 mg by mouth daily as needed.  . cloNIDine (CATAPRES) 0.2 MG tablet Take 0.2 mg by mouth 2 (two) times daily.   . colchicine 0.6 MG tablet Take 1 tablet by mouth daily.  . diphenhydrAMINE (BENADRYL) 25 MG tablet Take 1 tablet (25 mg total) by mouth every 6 (six) hours as needed for itching.  . ezetimibe (ZETIA) 10 MG tablet Take by mouth.  . fluticasone (FLONASE) 50 MCG/ACT nasal spray Place into the  nose.  . furosemide (LASIX) 20 MG tablet TAKE 1 TABLET BY MOUTH ONCE DAILY AS NEEDED FOR LEG SWELLING  . gabapentin (NEURONTIN) 300 MG capsule Take 300 mg by mouth 3 (three) times daily.  . Garlic 1000 MG CAPS Take 5,000 mg by mouth daily after breakfast.   . glimepiride (AMARYL) 2 MG tablet Take 2 mg by mouth daily before breakfast.  . Glucosamine-Chondroit-Vit C-Mn (GLUCOSAMINE-CHONDROITIN) CAPS Take 1 tablet by mouth daily.  . hydrochlorothiazide (HYDRODIURIL) 25 MG tablet Take 25 mg by mouth daily after breakfast.   . magnesium oxide (MAG-OX) 400 (241.3 Mg) MG tablet Take 0.5 tablets (200 mg total) by mouth 2 (two) times daily.  . metFORMIN (GLUCOPHAGE) 850 MG tablet Take 850 mg by mouth daily with breakfast.  . Multiple Vitamin (MULITIVITAMIN WITH MINERALS) TABS Take 1 tablet by mouth daily after breakfast.   . nabumetone (RELAFEN) 500 MG tablet TAKE 1 TABLET BY MOUTH 2-3 TIMES DAILY AS NEEDED FOR INFLAMMATION  . ondansetron (ZOFRAN) 4 MG tablet Take by mouth.  Marland Kitchen OVER THE COUNTER MEDICATION Apply 1 application topically daily. sween cream applied to groin area daily to prevent rash  . pantoprazole (PROTONIX) 40 MG tablet Take 1 tablet (40 mg total) by mouth daily.  . polyethylene glycol powder (GLYCOLAX/MIRALAX) 17 GM/SCOOP powder Take by mouth.  Marland Kitchen  PROAIR HFA 108 (90 BASE) MCG/ACT inhaler Inhale 2 puffs into the lungs every 4 (four) hours as needed for wheezing or shortness of breath.   . promethazine (PHENERGAN) 25 MG tablet Take 1 tablet (25 mg total) by mouth every 6 (six) hours as needed for nausea.  . propranolol (INDERAL) 20 MG tablet Take by mouth.  . Sennosides 25 MG TABS Take by mouth.  . simvastatin (ZOCOR) 40 MG tablet Take by mouth.  . traMADol (ULTRAM) 50 MG tablet TAKE ONE-HALF TO ONE TABLET BY MOUTH EVERY 4 TO 6 HOURS AS NEEDED FOR PAIN  . zinc sulfate 220 (50 Zn) MG capsule Take 1 capsule by mouth daily.   No current facility-administered medications for this visit.  (Other)      REVIEW OF SYSTEMS: ROS    Positive for: Endocrine   Last edited by Elyse Jarvislayton, Kriston M on 02/01/2020  2:12 PM. (History)       ALLERGIES Allergies  Allergen Reactions  . Naproxen Sodium Rash    anaprox    PAST MEDICAL HISTORY Past Medical History:  Diagnosis Date  . Arthritis    hip  . Constipation    uses laxatives several times a week  . Depression   . Diabetes mellitus    takes Metformin and Amaryl daily  . Dizziness    occasionally and related to meds   . Early cataracts, bilateral   . History of bronchitis    last time several yrs ago  . History of migraine    many yrs ago  . Hyperlipidemia    takes Zetia and Zocor daily  . Hypertension    takes Benazepril nightly and Propranolol tid and Clonidine daily  . Insomnia   . Low back pain   . Peripheral edema   . Peripheral neuropathy   . PONV (postoperative nausea and vomiting)   . Slow urinary stream    occasionally   Past Surgical History:  Procedure Laterality Date  . COLONOSCOPY    . d&c/hysteroscopy/ablation    . DILATION AND CURETTAGE OF UTERUS     couple of times  . ESOPHAGOGASTRODUODENOSCOPY    . 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 POSTERIOR CERVICAL FUSION/FORAMINOTOMY  10/15/2011   Procedure: POSTERIOR CERVICAL FUSION/FORAMINOTOMY LEVEL 2;  Surgeon: Kerrin ChampagneJames E Nitka, MD;  Location: [facility] OR;  Service: Orthopedics;  Laterality: N/A;  Right C6-7, C7-T1 Foraminotomy with excision HNP C7-T1  . UPPER GASTROINTESTINAL ENDOSCOPY      FAMILY HISTORY Family History  Problem Relation Age of Onset  . Cancer Mother   . Stroke Mother   . Diabetes Mother   . Hypertension Mother   . Colon cancer Mother [id number]       died 2007  . Gout Father   . Heart disease Father     SOCIAL HISTORY Social History   Tobacco Use  . Smoking status: Never Smoker  . Smokeless tobacco: Never Used  Substance Use Topics  . Alcohol use: No  . Drug use: No         OPHTHALMIC  EXAM: Base Eye Exam    Visual Acuity (Snellen - Linear)      Right Left   Dist cc 20/25 20/40 -2   Dist ph cc  20/30 -1   Correction: Glasses       Tonometry (Tonopen, 2:20 PM)      Right Left   Pressure 14 12       Pupils  Pupils Dark Light Shape React APD   Right PERRL 4 4 Round Minimal None   Left PERRL 4 4 Round Minimal None       Visual Fields (Counting fingers)      Left Right    Full Full       Neuro/Psych    Oriented x3: Yes   Mood/Affect: Normal       Dilation    Both eyes: 1.0% Mydriacyl, 2.5% Phenylephrine @ 2:20 PM        Slit Lamp and Fundus Exam    External Exam      Right Left   External Normal Normal       Slit Lamp Exam      Right Left   Lids/Lashes Normal Normal   Conjunctiva/Sclera White and quiet White and quiet   Cornea Clear Clear   Anterior Chamber Deep and quiet Deep and quiet   Iris Round and reactive Round and reactive   Lens 2+ Nuclear sclerosis, 2+ Posterior subcapsular cataract 2+ Nuclear sclerosis, 1+ Posterior subcapsular cataract   Anterior Vitreous Normal Normal       Fundus Exam      Right Left   Posterior Vitreous Posterior vitreous detachment Posterior vitreous detachment   Disc Normal Normal   C/D Ratio 0.45 0.5   Macula Normal Normal   Vessels no DR no DR   Periphery Normal Normal          IMAGING AND PROCEDURES  Imaging and Procedures for 02/01/20  Color Fundus Photography Optos - OU - Both Eyes       Right Eye Progression has been stable. Disc findings include normal observations. Macula : normal observations. Vessels : normal observations. Periphery : normal observations.   Left Eye Progression has been stable. Disc findings include normal observations. Vessels : normal observations. Periphery : normal observations.   Notes No detectable diabetic retinopathy with clear media                ASSESSMENT/PLAN:  Diabetes mellitus without complication (HCC) The patient has diabetes  without any evidence of retinopathy. The patient advised to maintain good blood glucose control, excellent blood pressure control, and favorable levels of cholesterol, low density lipoprotein, and high density lipoproteins. Follow up in 1 year was recommended. Explained that fluctuations in visual acuity , or "out of focus", may result from large variations of blood sugar control.  Nuclear sclerotic cataract of both eyes The nature of cataract was discussed with the patient as well as the elective nature of surgery. The patient was reassured that surgery at a later date does not put the patient at risk for a worse outcome. It was emphasized that the need for surgery is dictated by the patient's quality of life as influenced by the cataract. Patient was instructed to maintain close follow up with their general eye care doctor. , May account for acuity.      ICD-10-CM   1. Posterior vitreous detachment of left eye  H43.812 Color Fundus Photography Optos - OU - Both Eyes    CANCELED: OCT, Retina - OU - Both Eyes  2. Posterior subcapsular age-related cataract of right eye  H25.041   3. Posterior vitreous detachment of right eye  H43.811   4. Diabetes mellitus without complication (HCC)  E11.9   5. Nuclear sclerotic cataract of both eyes  H25.13   6. 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  I explained to  the patient that visual acuity is likely improve dramatically with cataract surgery intraocular lens implant OU, PSC cataract likely give her much glare.    2.  I encouraged patient to follow-up with Dr. Alden Hipp of [facility] eye care for these considerations  3.  No detectable diabetic retinopathy  Ophthalmic Meds Ordered this visit:  No orders of the defined types were placed in this encounter.      Return in about 1 year (around 01/31/2021) for DILATE OU, COLOR FP, OCT.  There are no Patient Instructions on file for this visit.   Explained the diagnoses,  plan, and follow up with the patient and they expressed understanding.  Patient expressed understanding of the importance of proper follow up care.   Alford Highland Balian Schaller M.D. Diseases & Surgery of the Retina and Vitreous [facility] 02/01/20     Abbreviations: M myopia (nearsighted); A astigmatism; H hyperopia (farsighted); P presbyopia; Mrx spectacle prescription;  CTL contact lenses; OD right eye; OS left eye; OU both eyes  XT exotropia; ET esotropia; PEK punctate epithelial keratitis; PEE punctate epithelial erosions; DES dry eye syndrome; MGD meibomian gland dysfunction; ATs artificial tears; PFAT's preservative free artificial tears; NSC nuclear sclerotic cataract; PSC posterior subcapsular cataract; ERM epi-retinal membrane; PVD posterior vitreous detachment; RD retinal detachment; DM diabetes mellitus; DR diabetic retinopathy; NPDR non-proliferative diabetic retinopathy; PDR proliferative diabetic retinopathy; CSME clinically significant macular edema; DME diabetic macular edema; dbh dot blot hemorrhages; CWS cotton wool spot; POAG primary open angle glaucoma; C/D cup-to-disc ratio; HVF humphrey visual field; GVF goldmann visual field; OCT optical coherence tomography; IOP intraocular pressure; BRVO Branch retinal vein occlusion; CRVO central retinal vein occlusion; CRAO central retinal artery occlusion; BRAO branch retinal artery occlusion; RT retinal tear; SB scleral buckle; PPV pars plana vitrectomy; VH Vitreous hemorrhage; PRP panretinal laser photocoagulation; IVK intravitreal kenalog; VMT vitreomacular traction; MH Macular hole;  NVD neovascularization of the disc; NVE neovascularization elsewhere; AREDS age related eye disease study; ARMD age related macular degeneration; POAG primary open angle glaucoma; EBMD epithelial/anterior basement membrane dystrophy; ACIOL anterior chamber intraocular lens; IOL intraocular lens; PCIOL posterior chamber intraocular lens; Phaco/IOL  phacoemulsification with intraocular lens placement; PRK photorefractive keratectomy; LASIK laser assisted in situ keratomileusis; HTN hypertension; DM diabetes mellitus; COPD chronic obstructive pulmonary disease

## 2020-04-12 ENCOUNTER — Ambulatory Visit (INDEPENDENT_AMBULATORY_CARE_PROVIDER_SITE_OTHER): Payer: Medicare Other | Admitting: Podiatry

## 2020-04-12 ENCOUNTER — Encounter: Payer: Self-pay | Admitting: Podiatry

## 2020-04-12 ENCOUNTER — Other Ambulatory Visit: Payer: Self-pay

## 2020-04-12 DIAGNOSIS — M79675 Pain in left toe(s): Secondary | ICD-10-CM

## 2020-04-12 DIAGNOSIS — E1149 Type 2 diabetes mellitus with other diabetic neurological complication: Secondary | ICD-10-CM

## 2020-04-12 DIAGNOSIS — M79674 Pain in right toe(s): Secondary | ICD-10-CM

## 2020-04-12 DIAGNOSIS — E1140 Type 2 diabetes mellitus with diabetic neuropathy, unspecified: Secondary | ICD-10-CM | POA: Diagnosis not present

## 2020-04-12 DIAGNOSIS — L84 Corns and callosities: Secondary | ICD-10-CM

## 2020-04-12 DIAGNOSIS — B351 Tinea unguium: Secondary | ICD-10-CM

## 2020-04-12 DIAGNOSIS — N179 Acute kidney failure, unspecified: Secondary | ICD-10-CM

## 2020-04-12 NOTE — Progress Notes (Signed)
This patient returns to my office for at risk foot care.  This patient requires this care by a professional since this patient will be at risk due to having diabetes.    This patient is unable to cut nails herself since the patient cannot reach her nails.These nails are painful walking and wearing shoes.  This patient presents for at risk foot care today.  General Appearance  Alert, conversant and in no acute stress.  Vascular  Dorsalis pedis and posterior tibial  pulses are not  palpable due to swelling bilaterally.  Capillary return is within normal limits  bilaterally. Temperature is within normal limits  bilaterally.  Neurologic  Senn-Weinstein monofilament wire test within normal limits  bilaterally. Muscle power within normal limits bilaterally.  Nails Thick disfigured discolored nails with subungual debris  3-5 left and 2-5 right.. No evidence of bacterial infection or drainage bilaterally.  Orthopedic  No limitations of motion  feet .  No crepitus or effusions noted.  No bony pathology or digital deformities noted.  Skin  normotropic skin with no porokeratosis noted bilaterally.  No signs of infections or ulcers noted.     Onychomycosis  Pain in right toes  Pain in left toes  Consent was obtained for treatment procedures.   Mechanical debridement of nails 1-5  bilaterally performed with a nail nipper.  Filed with dremel without incident.    Return office visit   3 months                   Told patient to return for periodic foot care and evaluation due to potential at risk complications.   Helane Gunther DPM

## 2020-05-06 ENCOUNTER — Other Ambulatory Visit: Payer: Self-pay | Admitting: Internal Medicine

## 2020-05-07 LAB — COMPLETE METABOLIC PANEL WITH GFR
AG Ratio: 1.8 (calc) (ref 1.0–2.5)
ALT: 16 U/L (ref 6–29)
AST: 23 U/L (ref 10–35)
Albumin: 4.5 g/dL (ref 3.6–5.1)
Alkaline phosphatase (APISO): 66 U/L (ref 37–153)
BUN/Creatinine Ratio: 18 (calc) (ref 6–22)
BUN: 21 mg/dL (ref 7–25)
CO2: 28 mmol/L (ref 20–32)
Calcium: 10.1 mg/dL (ref 8.6–10.4)
Chloride: 90 mmol/L — ABNORMAL LOW (ref 98–110)
Creat: 1.16 mg/dL — ABNORMAL HIGH (ref 0.50–0.99)
GFR, Est African American: 56 mL/min/{1.73_m2} — ABNORMAL LOW (ref >=60)
GFR, Est Non African American: 48 mL/min/{1.73_m2} — ABNORMAL LOW (ref >=60)
Globulin: 2.5 g/dL (calc) (ref 1.9–3.7)
Glucose, Bld: 138 mg/dL — ABNORMAL HIGH (ref 65–99)
Potassium: 4.6 mmol/L (ref 3.5–5.3)
Sodium: 130 mmol/L — ABNORMAL LOW (ref 135–146)
Total Bilirubin: 0.4 mg/dL (ref 0.2–1.2)
Total Protein: 7 g/dL (ref 6.1–8.1)

## 2020-05-07 LAB — CBC
HCT: 39.8 % (ref 35.0–45.0)
Hemoglobin: 13.6 g/dL (ref 11.7–15.5)
MCH: 33 pg (ref 27.0–33.0)
MCHC: 34.2 g/dL (ref 32.0–36.0)
MCV: 96.6 fL (ref 80.0–100.0)
MPV: 9.8 fL (ref 7.5–12.5)
Platelets: 255 10*3/uL (ref 140–400)
RBC: 4.12 10*6/uL (ref 3.80–5.10)
RDW: 12.6 % (ref 11.0–15.0)
WBC: 10 10*3/uL (ref 3.8–10.8)

## 2020-05-07 LAB — VITAMIN D 25 HYDROXY (VIT D DEFICIENCY, FRACTURES): Vit D, 25-Hydroxy: 67 ng/mL (ref 30–100)

## 2020-05-07 LAB — LIPID PANEL
Cholesterol: 125 mg/dL (ref <200)
HDL: 35 mg/dL — ABNORMAL LOW (ref >=50)
LDL Cholesterol (Calc): 59 mg/dL (calc)
Non-HDL Cholesterol (Calc): 90 mg/dL (calc) (ref <130)
Total CHOL/HDL Ratio: 3.6 (calc) (ref <5.0)
Triglycerides: 297 mg/dL — ABNORMAL HIGH (ref <150)

## 2020-05-07 LAB — TSH: TSH: 1.75 mIU/L (ref 0.40–4.50)

## 2020-05-09 ENCOUNTER — Other Ambulatory Visit: Payer: Self-pay | Admitting: Internal Medicine

## 2020-05-09 DIAGNOSIS — E2839 Other primary ovarian failure: Secondary | ICD-10-CM

## 2020-05-30 ENCOUNTER — Other Ambulatory Visit: Payer: Self-pay | Admitting: Internal Medicine

## 2020-05-30 DIAGNOSIS — Z1231 Encounter for screening mammogram for malignant neoplasm of breast: Secondary | ICD-10-CM

## 2020-07-07 ENCOUNTER — Ambulatory Visit: Payer: Medicare Other | Admitting: Obstetrics and Gynecology

## 2020-07-12 ENCOUNTER — Ambulatory Visit: Payer: Medicare Other | Admitting: Podiatry

## 2020-07-19 ENCOUNTER — Ambulatory Visit: Payer: Medicare Other

## 2020-08-08 ENCOUNTER — Encounter: Payer: Self-pay | Admitting: Podiatry

## 2020-08-08 ENCOUNTER — Ambulatory Visit (INDEPENDENT_AMBULATORY_CARE_PROVIDER_SITE_OTHER): Payer: Medicare Other | Admitting: Podiatry

## 2020-08-08 ENCOUNTER — Other Ambulatory Visit: Payer: Self-pay

## 2020-08-08 DIAGNOSIS — M79674 Pain in right toe(s): Secondary | ICD-10-CM

## 2020-08-08 DIAGNOSIS — U071 COVID-19: Secondary | ICD-10-CM | POA: Insufficient documentation

## 2020-08-08 DIAGNOSIS — B351 Tinea unguium: Secondary | ICD-10-CM

## 2020-08-08 DIAGNOSIS — I400 Infective myocarditis: Secondary | ICD-10-CM | POA: Insufficient documentation

## 2020-08-08 DIAGNOSIS — N179 Acute kidney failure, unspecified: Secondary | ICD-10-CM

## 2020-08-08 DIAGNOSIS — I779 Disorder of arteries and arterioles, unspecified: Secondary | ICD-10-CM | POA: Insufficient documentation

## 2020-08-08 DIAGNOSIS — E1140 Type 2 diabetes mellitus with diabetic neuropathy, unspecified: Secondary | ICD-10-CM

## 2020-08-08 DIAGNOSIS — E1149 Type 2 diabetes mellitus with other diabetic neurological complication: Secondary | ICD-10-CM

## 2020-08-08 DIAGNOSIS — M79675 Pain in left toe(s): Secondary | ICD-10-CM

## 2020-08-08 NOTE — Progress Notes (Signed)
This patient returns to my office for at risk foot care.  This patient requires this care by a professional since this patient will be at risk due to having diabetes.    This patient is unable to cut nails herself since the patient cannot reach her nails.These nails are painful walking and wearing shoes.  This patient presents for at risk foot care today.  General Appearance  Alert, conversant and in no acute stress.  Vascular  Dorsalis pedis and posterior tibial  pulses are not  palpable due to swelling bilaterally.  Capillary return is within normal limits  bilaterally. Temperature is within normal limits  bilaterally.  Neurologic  Senn-Weinstein monofilament wire test within normal limits  bilaterally. Muscle power within normal limits bilaterally.  Nails Thick disfigured discolored nails with subungual debris  3-5 left and 2-5 right.. No evidence of bacterial infection or drainage bilaterally.  Orthopedic  No limitations of motion  feet .  No crepitus or effusions noted.  No bony pathology or digital deformities noted.  Skin  normotropic skin with no porokeratosis noted bilaterally.  No signs of infections or ulcers noted.   Callus sub 1st MPJ right foot.  Onychomycosis  Pain in right toes  Pain in left toes  Consent was obtained for treatment procedures.   Mechanical debridement of nails 1-5  bilaterally performed with a nail nipper.  Filed with dremel without incident.    Return office visit   3 months                   Told patient to return for periodic foot care and evaluation due to potential at risk complications.   Isabel Freese DPM  

## 2020-08-31 ENCOUNTER — Ambulatory Visit: Payer: Medicare Other | Admitting: Obstetrics and Gynecology

## 2020-09-15 ENCOUNTER — Ambulatory Visit
Admission: RE | Admit: 2020-09-15 | Discharge: 2020-09-15 | Disposition: A | Discharge status: Home / Self Care | Payer: Medicare Other | Source: Ambulatory Visit | Attending: Internal Medicine | Admitting: Internal Medicine

## 2020-09-15 ENCOUNTER — Other Ambulatory Visit: Payer: Self-pay

## 2020-09-15 ENCOUNTER — Emergency Department
Admission: EM | Admit: 2020-09-15 | Discharge: 2020-09-15 | Disposition: A | Discharge status: Home / Self Care | Payer: Medicare Other | Attending: Emergency Medicine | Admitting: Emergency Medicine

## 2020-09-15 DIAGNOSIS — Z8616 Personal history of COVID-19: Secondary | ICD-10-CM | POA: Diagnosis not present

## 2020-09-15 DIAGNOSIS — R197 Diarrhea, unspecified: Secondary | ICD-10-CM | POA: Diagnosis not present

## 2020-09-15 DIAGNOSIS — R110 Nausea: Secondary | ICD-10-CM | POA: Insufficient documentation

## 2020-09-15 DIAGNOSIS — E119 Type 2 diabetes mellitus without complications: Secondary | ICD-10-CM | POA: Diagnosis not present

## 2020-09-15 DIAGNOSIS — R55 Syncope and collapse: Secondary | ICD-10-CM

## 2020-09-15 DIAGNOSIS — Z7984 Long term (current) use of oral hypoglycemic drugs: Secondary | ICD-10-CM | POA: Diagnosis not present

## 2020-09-15 DIAGNOSIS — Z7982 Long term (current) use of aspirin: Secondary | ICD-10-CM | POA: Insufficient documentation

## 2020-09-15 DIAGNOSIS — Z79899 Other long term (current) drug therapy: Secondary | ICD-10-CM | POA: Insufficient documentation

## 2020-09-15 DIAGNOSIS — I10 Essential (primary) hypertension: Secondary | ICD-10-CM | POA: Diagnosis not present

## 2020-09-15 DIAGNOSIS — R42 Dizziness and giddiness: Secondary | ICD-10-CM | POA: Diagnosis not present

## 2020-09-15 DIAGNOSIS — Z1231 Encounter for screening mammogram for malignant neoplasm of breast: Secondary | ICD-10-CM

## 2020-09-15 LAB — CBC WITH DIFFERENTIAL/PLATELET
Abs Immature Granulocytes: 0.08 10*3/uL — ABNORMAL HIGH (ref 0.00–0.07)
Basophils Absolute: 0.1 10*3/uL (ref 0.0–0.1)
Basophils Relative: 0 %
Eosinophils Absolute: 0.1 10*3/uL (ref 0.0–0.5)
Eosinophils Relative: 1 %
HCT: 37 % (ref 36.0–46.0)
Hemoglobin: 13 g/dL (ref 12.0–15.0)
Immature Granulocytes: 1 %
Lymphocytes Relative: 13 %
Lymphs Abs: 1.6 10*3/uL (ref 0.7–4.0)
MCH: 34 pg (ref 26.0–34.0)
MCHC: 35.1 g/dL (ref 30.0–36.0)
MCV: 96.9 fL (ref 80.0–100.0)
Monocytes Absolute: 0.9 10*3/uL (ref 0.1–1.0)
Monocytes Relative: 8 %
Neutro Abs: 9 10*3/uL — ABNORMAL HIGH (ref 1.7–7.7)
Neutrophils Relative %: 77 %
Platelets: 245 10*3/uL (ref 150–400)
RBC: 3.82 MIL/uL — ABNORMAL LOW (ref 3.87–5.11)
RDW: 12.7 % (ref 11.5–15.5)
WBC: 11.7 10*3/uL — ABNORMAL HIGH (ref 4.0–10.5)
nRBC: 0 % (ref 0.0–0.2)

## 2020-09-15 LAB — BASIC METABOLIC PANEL
Anion gap: 11 (ref 5–15)
BUN: 24 mg/dL — ABNORMAL HIGH (ref 8–23)
CO2: 27 mmol/L (ref 22–32)
Calcium: 9.5 mg/dL (ref 8.9–10.3)
Chloride: 92 mmol/L — ABNORMAL LOW (ref 98–111)
Creatinine, Ser: 1.26 mg/dL — ABNORMAL HIGH (ref 0.44–1.00)
GFR, Estimated: 47 mL/min — ABNORMAL LOW (ref >60)
Glucose, Bld: 210 mg/dL — ABNORMAL HIGH (ref 70–99)
Potassium: 3.9 mmol/L (ref 3.5–5.1)
Sodium: 130 mmol/L — ABNORMAL LOW (ref 135–145)

## 2020-09-15 LAB — TROPONIN I (HIGH SENSITIVITY): Troponin I (High Sensitivity): 7 ng/L (ref <18)

## 2020-09-15 MED ORDER — LACTATED RINGERS IV BOLUS
1000.0000 mL | Freq: Once | INTRAVENOUS | Status: AC
  Administered 2020-09-15: 1000 mL via INTRAVENOUS

## 2020-09-15 MED ORDER — ONDANSETRON HCL 4 MG/2ML IJ SOLN
4.0000 mg | Freq: Once | INTRAMUSCULAR | Status: AC
  Administered 2020-09-15: 4 mg via INTRAVENOUS
  Filled 2020-09-15: qty 2

## 2020-09-15 NOTE — ED Triage Notes (Signed)
Patient report syncopal episode after eating a [employer] with family

## 2020-09-15 NOTE — ED Provider Notes (Signed)
[facility] Emergency Department Provider Note   ____________________________________________   Event Date/Time   First MD Initiated Contact with Patient 09/15/20 2103     (approximate)  I have reviewed the triage vital signs and the nursing notes.   HISTORY  Chief Complaint Near Syncope and Diarrhea    HPI Jodi Chavez is a 70 y.o. female with past medical history of hypertension, hyperlipidemia, diabetes who presents to the ED complaining of near syncope.  Patient reports that she had just finished eating at [employer] when she started to feel very lightheaded and like she was going to pass out.  She sat down in a chair outside of the restaurant, but then her vision started to get white and she felt increasingly lightheaded.  She never fully lost consciousness and denies any associated chest pain or shortness of breath.  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  She developed diarrhea during transport with EMS, additionally reports feeling nauseous.        Past Medical History:  Diagnosis Date   Arthritis    hip   Constipation    uses laxatives several times a week   Depression    Diabetes mellitus    takes Metformin and Amaryl daily   Dizziness    occasionally and related to meds    Early cataracts, bilateral    History of bronchitis    last time several yrs ago   History of migraine    many yrs ago   Hyperlipidemia    takes Zetia and Zocor daily   Hypertension    takes Benazepril nightly and Propranolol tid and Clonidine daily   Insomnia    Low back pain    Peripheral edema    Peripheral neuropathy    PONV (postoperative nausea and vomiting)    Slow urinary stream    occasionally    Patient Active Problem List   Diagnosis Date Noted   Bilateral carotid artery disease (HCC) 08/08/2020   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    Posterior vitreous detachment of left eye 02/01/2020   Posterior subcapsular age-related cataract of right eye 02/01/2020   Posterior vitreous detachment of right eye 02/01/2020   Nuclear sclerotic cataract of both eyes 02/01/2020   Posterior subcapsular polar age-related cataract of left eye 02/01/2020   Aortic atherosclerosis (HCC) 12/04/2019   Breakthrough COVID-19 10/07/2019   Elevated troponin 10/07/2019   Left knee pain 09/22/2015   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 06/23/2015   HLD (hyperlipidemia) 11/21/2014   Depression 11/21/2014   Nausea & vomiting 11/21/2014   Sepsis (HCC) 11/21/2014   Coughing 11/21/2014   UTI (lower urinary tract infection) 11/21/2014   AKI (acute kidney injury) (HCC) 11/21/2014   Nausea with vomiting    Cervical spondylosis with radiculopathy 10/15/2011    Class: Chronic   HNP (herniated nucleus pulposus), cervical 10/15/2011    Class: Chronic   Diabetes mellitus without complication (HCC) 09/30/2006   Essential hypertension 09/30/2006   DILATION AND CURETTAGE, HX OF 09/30/2006    Past Surgical History:  Procedure Laterality Date   COLONOSCOPY     d&c/hysteroscopy/ablation     DILATION AND CURETTAGE OF UTERUS     couple of times   ESOPHAGOGASTRODUODENOSCOPY     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 Authorizing Provider  amLODipine (NORVASC) 5 MG tablet Take 1 tablet by mouth daily. 10/13/19   [provider]  ascorbic acid (VITAMIN C) 500 MG tablet Take 1 tablet by mouth daily. 10/13/19   [provider]  aspirin  81 MG EC tablet Take by mouth.    [provider]  benazepril (LOTENSIN) 40 MG tablet Take by mouth. 05/18/15   [provider]  calcium-vitamin D (OSCAL WITH D) 500-200 MG-UNIT TABS tablet Take 1 tablet by mouth daily.    [provider]  cetirizine (ZYRTEC) 10 MG tablet Take 10 mg by mouth daily as needed. 01/02/18   [provider]  cloNIDine (CATAPRES) 0.2 MG tablet Take 0.2 mg by mouth 2 (two) times daily.     [provider]  cloNIDine (CATAPRES) 0.2 MG tablet Take by mouth. 05/18/15   [provider]  colchicine 0.6 MG tablet Take 1 tablet by mouth daily. 10/01/19   [provider]  diphenhydrAMINE (BENADRYL) 25 MG tablet Take 1 tablet (25 mg total) by mouth every 6 (six) hours as needed for itching. 11/25/14   Rai, Delene Ruffini, MD  diphenhydrAMINE (SOMINEX) 25 MG tablet Take by mouth. 11/25/14   [provider]  ezetimibe (ZETIA) 10 MG tablet Take by mouth. 05/18/15   [provider]  fluticasone (FLONASE) 50 MCG/ACT nasal spray Place into the nose. 04/18/15   [provider]  furosemide (LASIX) 20 MG tablet TAKE 1 TABLET BY MOUTH ONCE DAILY AS NEEDED FOR LEG SWELLING 03/02/18   [provider]  gabapentin (NEURONTIN) 300 MG capsule Take 300 mg by mouth 3 (three) times daily.    [provider]  Garlic (ODOR FREE GARLIC-X) 557 MG TABS Take by mouth.    [provider]  Garlic 1000 MG CAPS Take 5,000 mg by mouth daily after breakfast.     [provider]  glimepiride (AMARYL) 2 MG tablet Take 2 mg by mouth daily before breakfast.    [provider]  Glucosamine-Chondroit-Vit C-Mn (GLUCOSAMINE-CHONDROITIN) CAPS Take 1 tablet by mouth daily.    [provider]  Glucosamine-Chondroitin 500-400 MG CAPS Take 1 tablet by mouth daily.    [provider]  guaiFENesin-codeine (ROBITUSSIN AC) 100-10 MG/5ML syrup 1 TSP PO Q 8 HRS PRN COUGH 07/26/20   [provider]  hydrochlorothiazide (HYDRODIURIL) 25 MG tablet Take 25 mg by mouth daily after breakfast.     [provider]  magnesium oxide (MAG-OX) 400 (241.3 Mg) MG tablet Take 0.5 tablets (200 mg total) by mouth 2 (two) times daily. 10/12/19   Regalado, Belkys A, MD  metFORMIN (GLUCOPHAGE) 850 MG tablet Take 850 mg by mouth daily with breakfast.    [provider]  Multiple Vitamin (MULITIVITAMIN WITH MINERALS) TABS Take 1 tablet by mouth daily after breakfast.     [provider]  nabumetone (RELAFEN) 500 MG tablet TAKE 1 TABLET BY MOUTH 2-3 TIMES DAILY AS NEEDED FOR INFLAMMATION 10/31/17   Kathryne Hitch, MD  ondansetron (ZOFRAN) 4 MG tablet Take by mouth. 10/12/19   [provider]  OVER THE COUNTER MEDICATION Apply 1 application topically daily. sween cream applied to groin area daily  to prevent rash    [provider]  pantoprazole (PROTONIX) 40 MG tablet Take 1 tablet (40 mg total) by mouth daily. 11/25/14   Rai, Ripudeep K, MD  polyethylene glycol powder (GLYCOLAX/MIRALAX) 17 GM/SCOOP powder Take by mouth. 10/13/19   [provider]  polyethylene glycol powder (GLYCOLAX/MIRALAX) 17 GM/SCOOP powder Take by mouth. 10/13/19   [provider]  predniSONE (DELTASONE) 5 MG tablet 6 DAY TAPER, TAKE AS DIRECTED WITH FOOD 07/26/20   [provider]  PROAIR HFA 108 (90 BASE) MCG/ACT inhaler Inhale 2 puffs into the lungs every 4 (four) hours as needed for wheezing or shortness of breath.  06/11/14   [provider]  promethazine (PHENERGAN) 25 MG tablet Take 1 tablet (25 mg total) by mouth every 6 (six) hours as needed for nausea. 07/20/17   Molpus, John, MD  propranolol (INDERAL) 20 MG tablet Take by mouth. 11/29/19   [provider]  Sennosides 25 MG TABS Take by mouth.    [provider]  simvastatin (ZOCOR) 40 MG tablet Take by mouth. 05/18/15   [provider]  traMADol (ULTRAM) 50 MG  tablet TAKE ONE-HALF TO ONE TABLET BY MOUTH EVERY 4 TO 6 HOURS AS NEEDED FOR PAIN 04/17/16   Kerrin Champagne, MD  zinc sulfate 220 (50 Zn) MG capsule Take 1 capsule by mouth daily. 10/13/19   [provider]  DOXEPIN HCL PO Take by mouth.    05/10/11  [provider]  GABAPENTIN, PHN, PO Take by mouth.    05/10/11  [provider]    Allergies Naproxen sodium  Family History  Problem Relation Age of Onset   Cancer Mother    Stroke Mother    Diabetes Mother    Hypertension Mother    Colon cancer Mother 22       died 2005/06/13   Gout Father    Heart disease Father     Social History Social History   Tobacco Use   Smoking status: Never   Smokeless tobacco: Never  Substance Use Topics   Alcohol use: No   Drug use: No    Review of Systems  Constitutional: No fever/chills Eyes: No visual changes. ENT: No sore throat. Cardiovascular: Denies chest pain.  Positive for lightheadedness and near syncope. Respiratory: Denies shortness of breath. Gastrointestinal: No abdominal pain.  Positive for nausea, no vomiting.  Positive for diarrhea.  No constipation. Genitourinary: Negative for dysuria. Musculoskeletal: Negative for back pain. Skin: Negative for rash. Neurological: Negative for headaches, focal weakness or numbness.  ____________________________________________   PHYSICAL EXAM:  VITAL SIGNS: ED Triage Vitals  Enc Vitals Group     BP 09/15/20 Jun 13, 2100 (!) 154/74     Pulse Rate 09/15/20 2103 83     Resp 09/15/20 2103 18     Temp 09/15/20 2102 97.7 F (36.5 C)     Temp Source 09/15/20 13-Jun-2100 Oral     SpO2 09/15/20 2059 94 %     Weight 09/15/20 2104 198 lb (89.8 kg)     Height 09/15/20 14-Jun-2102 5\' 1"  (1.549 m)     Head Circumference --      Peak Flow --      Pain Score 09/15/20 06-14-2102 0     Pain Loc --      Pain Edu? --      Excl. in GC? --     Constitutional: Alert and oriented. Eyes: Conjunctivae are normal. Head: Atraumatic. Nose: No  congestion/rhinnorhea. Mouth/Throat: Mucous membranes  are moist. Neck: Normal ROM Cardiovascular: Normal rate, regular rhythm. Grossly normal heart sounds.  2+ radial pulses bilaterally. Respiratory: Normal respiratory effort.  No retractions. Lungs CTAB. Gastrointestinal: Soft and nontender. No distention. Genitourinary: deferred Musculoskeletal: No lower extremity tenderness nor edema. Neurologic:  Normal speech and language. No gross focal neurologic deficits are appreciated. Skin:  Skin is warm, dry and intact. No rash noted. Psychiatric: Mood and affect are normal. Speech and behavior are normal.  ____________________________________________   LABS (all labs ordered are listed, but only abnormal results are displayed)  Labs Reviewed  CBC WITH DIFFERENTIAL/PLATELET - Abnormal; Notable for the following components:      Result Value   WBC 11.7 (*)    RBC 3.82 (*)    Neutro Abs 9.0 (*)    Abs Immature Granulocytes 0.08 (*)    All other components within normal limits  BASIC METABOLIC PANEL - Abnormal; Notable for the following components:   Sodium 130 (*)    Chloride 92 (*)    Glucose, Bld 210 (*)    BUN 24 (*)    Creatinine, Ser 1.26 (*)    GFR, Estimated 47 (*)    All other components within normal limits  TROPONIN I (HIGH SENSITIVITY)   ____________________________________________  EKG  ED ECG REPORT I, Chesley Noon, the attending physician, personally viewed and interpreted this ECG.   Date: 09/15/2020  EKG Time: 21:06  Rate: 82  Rhythm: normal sinus rhythm  Axis: Normal  Intervals:none  ST&T Change: None   PROCEDURES  Procedure(s) performed (including Critical Care):  Procedures   ____________________________________________   INITIAL IMPRESSION / ASSESSMENT AND PLAN / ED COURSE      70 year old female with past medical history of hypertension, hyperlipidemia, and diabetes presents to the ED following episode of lightheadedness and near  syncope after eating at a restaurant.  She has now developed diarrhea along with nausea, symptoms seem most consistent with vasovagal episode.  EKG shows no evidence of arrhythmia or ischemia, we will observe on cardiac monitor and check labs including troponin.  We will hydrate with IV fluids and treat symptomatically with Zofran.  Patient reports feeling better following IV fluids and Zofran, labs remarkable for mild AKI and I suspect her symptoms are related to dehydration versus vasovagal episode.  Troponin within normal limits and I doubt cardiac etiology.  She is appropriate for discharge home with PCP follow-up, was counseled to return to the ED for new worsening symptoms.  Patient agrees with plan.      ____________________________________________   FINAL CLINICAL IMPRESSION(S) / ED DIAGNOSES  Final diagnoses:  Lightheadedness  Near syncope  Diarrhea, unspecified type     ED Discharge Orders     None        Note:  This document was prepared using Dragon voice recognition software and may include unintentional dictation errors.    Chesley Noon, MD 09/15/20 2220

## 2020-10-11 ENCOUNTER — Ambulatory Visit: Payer: Medicare Other | Admitting: Obstetrics and Gynecology

## 2020-11-08 ENCOUNTER — Ambulatory Visit: Payer: Medicare Other | Admitting: Podiatry

## 2020-11-11 ENCOUNTER — Other Ambulatory Visit: Payer: Medicare Other

## 2020-11-21 ENCOUNTER — Ambulatory Visit: Payer: Medicare Other | Admitting: Obstetrics and Gynecology

## 2020-11-29 ENCOUNTER — Ambulatory Visit: Payer: Medicare Other | Admitting: Podiatry

## 2020-12-14 ENCOUNTER — Ambulatory Visit: Payer: Medicare Other | Admitting: Podiatry

## 2020-12-20 ENCOUNTER — Ambulatory Visit: Payer: Medicare Other | Admitting: Podiatry

## 2020-12-29 ENCOUNTER — Ambulatory Visit: Payer: Medicare Other | Admitting: Obstetrics and Gynecology

## 2021-01-03 ENCOUNTER — Ambulatory Visit: Payer: Medicare Other | Admitting: Podiatry

## 2021-01-16 ENCOUNTER — Ambulatory Visit: Payer: Medicare Other | Admitting: Podiatry

## 2021-01-31 ENCOUNTER — Encounter (INDEPENDENT_AMBULATORY_CARE_PROVIDER_SITE_OTHER): Payer: Self-pay | Admitting: Ophthalmology

## 2021-01-31 ENCOUNTER — Other Ambulatory Visit: Payer: Self-pay

## 2021-01-31 ENCOUNTER — Ambulatory Visit (INDEPENDENT_AMBULATORY_CARE_PROVIDER_SITE_OTHER): Payer: Medicare Other | Admitting: Ophthalmology

## 2021-01-31 DIAGNOSIS — H25042 Posterior subcapsular polar age-related cataract, left eye: Secondary | ICD-10-CM

## 2021-01-31 DIAGNOSIS — H43812 Vitreous degeneration, left eye: Secondary | ICD-10-CM

## 2021-01-31 DIAGNOSIS — H43811 Vitreous degeneration, right eye: Secondary | ICD-10-CM | POA: Diagnosis not present

## 2021-01-31 DIAGNOSIS — E119 Type 2 diabetes mellitus without complications: Secondary | ICD-10-CM

## 2021-01-31 DIAGNOSIS — H25041 Posterior subcapsular polar age-related cataract, right eye: Secondary | ICD-10-CM

## 2021-01-31 NOTE — Assessment & Plan Note (Signed)
No signs of detectable diabetic retinopathy ?

## 2021-01-31 NOTE — Assessment & Plan Note (Signed)
Accounts for visual axis, and clarity loss as well as some acuity, this high myopia deserves consideration for cataract surgery and lens implant

## 2021-01-31 NOTE — Assessment & Plan Note (Signed)
Physiologic OS 

## 2021-01-31 NOTE — Assessment & Plan Note (Signed)
Central visual axis PS even posterior polar cataract OD follow-up with [facility]

## 2021-01-31 NOTE — Progress Notes (Signed)
01/31/2021     CHIEF COMPLAINT Patient presents for  Chief Complaint  Patient presents with   Retina Evaluation      HISTORY OF PRESENT ILLNESS: Jodi Chavez is a 70 y.o. female who presents to the clinic today for:   HPI     Retina Evaluation   In both eyes.  I, the attending physician,  performed the HPI with the patient and updated documentation appropriately.        Comments   History of PVD OS      Last edited by Edmon Crape, MD on 01/31/2021  1:23 PM.      Referring physician: Ernesto Rutherford, MD [address] [place],  [place] [address]  HISTORICAL INFORMATION:   Selected notes from the MEDICAL RECORD NUMBER    Lab Results  Component Value Date   HGBA1C 7.0 (H) 11/22/2014     CURRENT MEDICATIONS: No current outpatient medications on file. (Ophthalmic Drugs)   No current facility-administered medications for this visit. (Ophthalmic Drugs)   Current Outpatient Medications (Other)  Medication Sig   amLODipine (NORVASC) 5 MG tablet Take 1 tablet by mouth daily.   ascorbic acid (VITAMIN C) 500 MG tablet Take 1 tablet by mouth daily.   aspirin 81 MG EC tablet Take by mouth.   benazepril (LOTENSIN) 40 MG tablet Take by mouth.   calcium-vitamin D (OSCAL WITH D) 500-200 MG-UNIT TABS tablet Take 1 tablet by mouth daily.   cetirizine (ZYRTEC) 10 MG tablet Take 10 mg by mouth daily as needed.   cloNIDine (CATAPRES) 0.2 MG tablet Take 0.2 mg by mouth 2 (two) times daily.    cloNIDine (CATAPRES) 0.2 MG tablet Take by mouth.   colchicine 0.6 MG tablet Take 1 tablet by mouth daily.   diphenhydrAMINE (BENADRYL) 25 MG tablet Take 1 tablet (25 mg total) by mouth every 6 (six) hours as needed for itching.   diphenhydrAMINE (SOMINEX) 25 MG tablet Take by mouth.   ezetimibe (ZETIA) 10 MG tablet Take by mouth.   fluticasone (FLONASE) 50 MCG/ACT nasal spray Place into the nose.   furosemide (LASIX) 20 MG tablet TAKE 1 TABLET BY MOUTH ONCE DAILY AS NEEDED FOR LEG  SWELLING   gabapentin (NEURONTIN) 300 MG capsule Take 300 mg by mouth 3 (three) times daily.   Garlic (ODOR FREE GARLIC-X) 132 MG TABS Take by mouth.   Garlic 1000 MG CAPS Take 5,000 mg by mouth daily after breakfast.    glimepiride (AMARYL) 2 MG tablet Take 2 mg by mouth daily before breakfast.   Glucosamine-Chondroit-Vit C-Mn (GLUCOSAMINE-CHONDROITIN) CAPS Take 1 tablet by mouth daily.   Glucosamine-Chondroitin 500-400 MG CAPS Take 1 tablet by mouth daily.   guaiFENesin-codeine (ROBITUSSIN AC) 100-10 MG/5ML syrup 1 TSP PO Q 8 HRS PRN COUGH   hydrochlorothiazide (HYDRODIURIL) 25 MG tablet Take 25 mg by mouth daily after breakfast.    magnesium oxide (MAG-OX) 400 (241.3 Mg) MG tablet Take 0.5 tablets (200 mg total) by mouth 2 (two) times daily.   metFORMIN (GLUCOPHAGE) 850 MG tablet Take 850 mg by mouth daily with breakfast.   Multiple Vitamin (MULITIVITAMIN WITH MINERALS) TABS Take 1 tablet by mouth daily after breakfast.    nabumetone (RELAFEN) 500 MG tablet TAKE 1 TABLET BY MOUTH 2-3 TIMES DAILY AS NEEDED FOR INFLAMMATION   ondansetron (ZOFRAN) 4 MG tablet Take by mouth.   OVER THE COUNTER MEDICATION Apply 1 application topically daily. sween cream applied to groin area daily to prevent  rash   pantoprazole (PROTONIX) 40 MG tablet Take 1 tablet (40 mg total) by mouth daily.   polyethylene glycol powder (GLYCOLAX/MIRALAX) 17 GM/SCOOP powder Take by mouth.   polyethylene glycol powder (GLYCOLAX/MIRALAX) 17 GM/SCOOP powder Take by mouth.   predniSONE (DELTASONE) 5 MG tablet 6 DAY TAPER, TAKE AS DIRECTED WITH FOOD   PROAIR HFA 108 (90 BASE) MCG/ACT inhaler Inhale 2 puffs into the lungs every 4 (four) hours as needed for wheezing or shortness of breath.    promethazine (PHENERGAN) 25 MG tablet Take 1 tablet (25 mg total) by mouth every 6 (six) hours as needed for nausea.   propranolol (INDERAL) 20 MG tablet Take by mouth.   Sennosides 25 MG TABS Take by mouth.   simvastatin (ZOCOR) 40 MG tablet  Take by mouth.   traMADol (ULTRAM) 50 MG tablet TAKE ONE-HALF TO ONE TABLET BY MOUTH EVERY 4 TO 6 HOURS AS NEEDED FOR PAIN   zinc sulfate 220 (50 Zn) MG capsule Take 1 capsule by mouth daily.   No current facility-administered medications for this visit. (Other)      REVIEW OF SYSTEMS:    ALLERGIES Allergies  Allergen Reactions   Naproxen Sodium Rash    anaprox    PAST MEDICAL HISTORY Past Medical History:  Diagnosis Date   Arthritis    hip   Constipation    uses laxatives several times a week   Depression    Diabetes mellitus    takes Metformin and Amaryl daily   Dizziness    occasionally and related to meds    Early cataracts, bilateral    History of bronchitis    last time several yrs ago   History of migraine    many yrs ago   Hyperlipidemia    takes Zetia and Zocor daily   Hypertension    takes Benazepril nightly and Propranolol tid and Clonidine daily   Insomnia    Low back pain    Peripheral edema    Peripheral neuropathy    PONV (postoperative nausea and vomiting)    Slow urinary stream    occasionally   Past Surgical History:  Procedure Laterality Date   COLONOSCOPY     d&c/hysteroscopy/ablation     DILATION AND CURETTAGE OF UTERUS     couple of times   ESOPHAGOGASTRODUODENOSCOPY     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   POSTERIOR CERVICAL FUSION/FORAMINOTOMY  10/15/2011   Procedure: POSTERIOR CERVICAL FUSION/FORAMINOTOMY LEVEL 2;  Surgeon: Kerrin Champagne, MD;  Location: [facility] OR;  Service: Orthopedics;  Laterality: N/A;  Right C6-7, C7-T1 Foraminotomy with excision HNP C7-T1   UPPER GASTROINTESTINAL ENDOSCOPY      FAMILY HISTORY Family History  Problem Relation Age of Onset   Cancer Mother    Stroke Mother    Diabetes Mother    Hypertension Mother    Colon cancer Mother 41       died Jul 18, 2005   Gout Father    Heart disease Father     SOCIAL HISTORY Social History   Tobacco Use   Smoking status: Never   Smokeless  tobacco: Never  Substance Use Topics   Alcohol use: No   Drug use: No         OPHTHALMIC EXAM:  Base Eye Exam     Visual Acuity (ETDRS)       Right Left   Dist cc 20/25 20/50   Dist ph cc NI NI  Tonometry (Tonopen, 1:27 PM)       Right Left   Pressure 17 16         Pupils       Pupils APD   Right PERRL None   Left PERRL None         Extraocular Movement       Right Left    Full, Ortho Full, Ortho         Neuro/Psych     Oriented x3: Yes   Mood/Affect: Normal           Slit Lamp and Fundus Exam     External Exam       Right Left   External Normal Normal         Slit Lamp Exam       Right Left   Lids/Lashes Normal Normal   Conjunctiva/Sclera White and quiet White and quiet   Cornea Clear Clear   Anterior Chamber Deep and quiet Deep and quiet   Iris Round and reactive Round and reactive   Lens 2+ Nuclear sclerosis, 2+ Posterior subcapsular cataract 2+ Nuclear sclerosis, 1+ Posterior subcapsular cataract   Anterior Vitreous Normal Normal         Fundus Exam       Right Left   Posterior Vitreous Posterior vitreous detachment Posterior vitreous detachment   Disc Normal Normal   C/D Ratio 0.45 0.5   Macula Normal Normal   Vessels no DR no DR   Periphery Normal Normal            IMAGING AND PROCEDURES  Imaging and Procedures for 01/31/21  OCT, Retina - OU - Both Eyes       Right Eye Quality was good. Scan locations included subfoveal. Central Foveal Thickness: 276. Progression has no prior data. Findings include normal foveal contour.   Left Eye Quality was good. Scan locations included subfoveal. Central Foveal Thickness: 257. Progression has no prior data. Findings include normal foveal contour.   Notes PVD OU,, no active maculopathy             ASSESSMENT/PLAN:  Posterior subcapsular age-related cataract of right eye Central visual axis PS even posterior polar cataract OD follow-up with [facility]  Diabetes mellitus without complication (HCC) No signs of detectable diabetic retinopathy  Posterior vitreous detachment of left eye Physiologic OS  Posterior subcapsular polar age-related cataract of left eye Accounts for visual axis, and clarity loss as well as some acuity, this high myopia deserves consideration for cataract surgery and lens implant  Posterior vitreous detachment of right eye Physiologic OD     ICD-10-CM   1. Posterior vitreous detachment of left eye  H43.812 OCT, Retina - OU - Both Eyes    2. Posterior vitreous detachment of right eye  H43.811 OCT, Retina - OU - Both Eyes    3. Posterior subcapsular age-related cataract of right eye  H25.041     4. Diabetes mellitus without complication (HCC)  E11.9     5. Posterior subcapsular polar age-related cataract of left eye  H25.042       1.  OU, with physiologic PVD.  2.  No signs of diabetic eye disease complications  3.  Progressive cataract in each eye accounts for acuity  Ophthalmic Meds Ordered this visit:  No orders of the defined types were placed in this encounter.      Return in about 1 year (around 01/31/2022) for COLOR FP, DILATE OU, OCT.  There  are no Patient Instructions on file for this visit.   Explained the diagnoses, plan, and follow up with the patient and they expressed understanding.  Patient expressed understanding of the importance of proper follow up care.   Alford Highland Delinda Malan M.D. Diseases & Surgery of the Retina and Vitreous [facility] 01/31/21     Abbreviations: M myopia (nearsighted); A astigmatism; H hyperopia (farsighted); P presbyopia; Mrx spectacle prescription;  CTL contact lenses; OD right eye; OS left eye; OU both eyes  XT exotropia; ET esotropia; PEK punctate epithelial keratitis; PEE punctate epithelial erosions; DES dry eye syndrome; MGD meibomian gland dysfunction; ATs artificial tears; PFAT's preservative free artificial tears; NSC  nuclear sclerotic cataract; PSC posterior subcapsular cataract; ERM epi-retinal membrane; PVD posterior vitreous detachment; RD retinal detachment; DM diabetes mellitus; DR diabetic retinopathy; NPDR non-proliferative diabetic retinopathy; PDR proliferative diabetic retinopathy; CSME clinically significant macular edema; DME diabetic macular edema; dbh dot blot hemorrhages; CWS cotton wool spot; POAG primary open angle glaucoma; C/D cup-to-disc ratio; HVF humphrey visual field; GVF goldmann visual field; OCT optical coherence tomography; IOP intraocular pressure; BRVO Branch retinal vein occlusion; CRVO central retinal vein occlusion; CRAO central retinal artery occlusion; BRAO branch retinal artery occlusion; RT retinal tear; SB scleral buckle; PPV pars plana vitrectomy; VH Vitreous hemorrhage; PRP panretinal laser photocoagulation; IVK intravitreal kenalog; VMT vitreomacular traction; MH Macular hole;  NVD neovascularization of the disc; NVE neovascularization elsewhere; AREDS age related eye disease study; ARMD age related macular degeneration; POAG primary open angle glaucoma; EBMD epithelial/anterior basement membrane dystrophy; ACIOL anterior chamber intraocular lens; IOL intraocular lens; PCIOL posterior chamber intraocular lens; Phaco/IOL phacoemulsification with intraocular lens placement; PRK photorefractive keratectomy; LASIK laser assisted in situ keratomileusis; HTN hypertension; DM diabetes mellitus; COPD chronic obstructive pulmonary disease

## 2021-01-31 NOTE — Assessment & Plan Note (Signed)
Physiologic OD 

## 2021-02-01 ENCOUNTER — Encounter: Payer: Self-pay | Admitting: Podiatry

## 2021-02-01 ENCOUNTER — Ambulatory Visit (INDEPENDENT_AMBULATORY_CARE_PROVIDER_SITE_OTHER): Payer: Medicare Other | Admitting: Podiatry

## 2021-02-01 DIAGNOSIS — M79675 Pain in left toe(s): Secondary | ICD-10-CM | POA: Diagnosis not present

## 2021-02-01 DIAGNOSIS — E1149 Type 2 diabetes mellitus with other diabetic neurological complication: Secondary | ICD-10-CM

## 2021-02-01 DIAGNOSIS — B351 Tinea unguium: Secondary | ICD-10-CM

## 2021-02-01 DIAGNOSIS — M79674 Pain in right toe(s): Secondary | ICD-10-CM

## 2021-02-01 DIAGNOSIS — E1140 Type 2 diabetes mellitus with diabetic neuropathy, unspecified: Secondary | ICD-10-CM

## 2021-02-01 DIAGNOSIS — N179 Acute kidney failure, unspecified: Secondary | ICD-10-CM

## 2021-02-01 NOTE — Progress Notes (Signed)
This patient returns to my office for at risk foot care.  This patient requires this care by a professional since this patient will be at risk due to having diabetes.    This patient is unable to cut nails herself since the patient cannot reach her nails.These nails are painful walking and wearing shoes.  This patient presents for at risk foot care today.  General Appearance  Alert, conversant and in no acute stress.  Vascular  Dorsalis pedis and posterior tibial  pulses are not  palpable due to swelling bilaterally.  Capillary return is within normal limits  bilaterally. Temperature is within normal limits  bilaterally.  Neurologic  Senn-Weinstein monofilament wire test within normal limits  bilaterally. Muscle power within normal limits bilaterally.  Nails Thick disfigured discolored nails with subungual debris  3-5 left and 2-5 right.. No evidence of bacterial infection or drainage bilaterally.  Orthopedic  No limitations of motion  feet .  No crepitus or effusions noted.  No bony pathology or digital deformities noted.  Skin  normotropic skin with no porokeratosis noted bilaterally.  No signs of infections or ulcers noted.   Callus sub 1st MPJ right foot.  Onychomycosis  Pain in right toes  Pain in left toes  Consent was obtained for treatment procedures.   Mechanical debridement of nails 1-5  bilaterally performed with a nail nipper.  Filed with dremel without incident.    Return office visit   3 months                   Told patient to return for periodic foot care and evaluation due to potential at risk complications.   Rexton Greulich DPM  

## 2021-02-06 ENCOUNTER — Ambulatory Visit: Payer: Medicare Other | Admitting: Obstetrics and Gynecology

## 2021-02-07 NOTE — Progress Notes (Deleted)
GYNECOLOGY ANNUAL PREVENTATIVE CARE ENCOUNTER NOTE  History:     Jodi Chavez is a 70 y.o. No obstetric history on file. female here for a routine annual gynecologic exam.  Current complaints: ***.     Denies abnormal vaginal bleeding, discharge, pelvic pain, problems with intercourse or other gynecologic concerns.     Gynecologic History No LMP recorded. Patient is postmenopausal. Last Pap that is on file is 2010 and normal. None in care everywhere.  Last Mammogram: 08/2020.  Result was normal Last Colonoscopy: ***.  Result was {norm/abn:16337}  Obstetric History OB History  No obstetric history on file.    Past Medical History:  Diagnosis Date   Arthritis    hip   Constipation    uses laxatives several times a week   Depression    Diabetes mellitus    takes Metformin and Amaryl daily   Dizziness    occasionally and related to meds    Early cataracts, bilateral    History of bronchitis    last time several yrs ago   History of migraine    many yrs ago   Hyperlipidemia    takes Zetia and Zocor daily   Hypertension    takes Benazepril nightly and Propranolol tid and Clonidine daily   Insomnia    Low back pain    Peripheral edema    Peripheral neuropathy    PONV (postoperative nausea and vomiting)    Slow urinary stream    occasionally    Past Surgical History:  Procedure Laterality Date   COLONOSCOPY     d&c/hysteroscopy/ablation     DILATION AND CURETTAGE OF UTERUS     couple of times   ESOPHAGOGASTRODUODENOSCOPY     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     ascorbic acid (VITAMIN C) 500 MG tablet Take 1 tablet by mouth daily.     aspirin 81 MG EC tablet Take by mouth.     benazepril (LOTENSIN) 40 MG tablet Take by mouth.     calcium-vitamin D (OSCAL WITH D) 500-200 MG-UNIT TABS tablet Take 1 tablet by mouth daily.     cetirizine (ZYRTEC) 10 MG tablet Take 10 mg by mouth daily as needed.  5   cloNIDine (CATAPRES) 0.2 MG tablet Take 0.2 mg by mouth 2 (two) times daily.      cloNIDine (CATAPRES) 0.2 MG tablet Take by mouth.     colchicine 0.6 MG tablet Take 1 tablet by mouth daily.     diphenhydrAMINE (BENADRYL) 25 MG tablet Take 1 tablet (25 mg total) by mouth every 6 (six) hours as needed for itching. 30 tablet 0   diphenhydrAMINE (SOMINEX) 25 MG tablet Take by mouth.     ezetimibe (ZETIA) 10 MG tablet Take by mouth.     fluticasone (FLONASE) 50 MCG/ACT nasal spray Place into the nose.     furosemide (LASIX) 20 MG tablet TAKE 1 TABLET BY MOUTH ONCE DAILY AS NEEDED FOR LEG SWELLING     gabapentin (NEURONTIN) 300 MG  capsule Take 300 mg by mouth 3 (three) times daily.     Garlic (ODOR FREE GARLIC-X) 284 MG TABS Take by mouth.     Garlic 1000 MG CAPS Take 5,000 mg by mouth daily after breakfast.      glimepiride (AMARYL) 2 MG tablet Take 2 mg by mouth daily before breakfast.     Glucosamine-Chondroit-Vit C-Mn (GLUCOSAMINE-CHONDROITIN) CAPS Take 1 tablet by mouth daily.     Glucosamine-Chondroitin 500-400 MG CAPS Take 1 tablet by mouth daily.     guaiFENesin-codeine (ROBITUSSIN AC) 100-10 MG/5ML syrup 1 TSP PO Q 8 HRS PRN COUGH     hydrochlorothiazide (HYDRODIURIL) 25 MG tablet Take 25 mg by mouth daily after breakfast.      magnesium oxide (MAG-OX) 400 (241.3 Mg) MG tablet Take 0.5 tablets (200 mg total) by mouth 2 (two) times daily. 30 tablet 0   metFORMIN (GLUCOPHAGE) 850 MG tablet Take 850 mg by mouth daily with breakfast.     Multiple Vitamin (MULITIVITAMIN WITH  MINERALS) TABS Take 1 tablet by mouth daily after breakfast.      nabumetone (RELAFEN) 500 MG tablet TAKE 1 TABLET BY MOUTH 2-3 TIMES DAILY AS NEEDED FOR INFLAMMATION 90 tablet 11   ondansetron (ZOFRAN) 4 MG tablet Take by mouth.     OVER THE COUNTER MEDICATION Apply 1 application topically daily. sween cream applied to groin area daily to prevent rash     pantoprazole (PROTONIX) 40 MG tablet Take 1 tablet (40 mg total) by mouth daily. 30 tablet 2   polyethylene glycol powder (GLYCOLAX/MIRALAX) 17 GM/SCOOP powder Take by mouth.     polyethylene glycol powder (GLYCOLAX/MIRALAX) 17 GM/SCOOP powder Take by mouth.     predniSONE (DELTASONE) 5 MG tablet 6 DAY TAPER, TAKE AS DIRECTED WITH FOOD     PROAIR HFA 108 (90 BASE) MCG/ACT inhaler Inhale 2 puffs into the lungs every 4 (four) hours as needed for wheezing or shortness of breath.      promethazine (PHENERGAN) 25 MG tablet Take 1 tablet (25 mg total) by mouth every 6 (six) hours as needed for nausea. 12 tablet 0   propranolol (INDERAL) 20 MG tablet Take by mouth.     Sennosides 25 MG TABS Take by mouth.     simvastatin (ZOCOR) 40 MG tablet Take by mouth.     traMADol (ULTRAM) 50 MG tablet TAKE ONE-HALF TO ONE TABLET BY MOUTH EVERY 4 TO 6 HOURS AS NEEDED FOR PAIN 60 tablet 1   zinc sulfate 220 (50 Zn) MG capsule Take 1 capsule by mouth daily.     [DISCONTINUED] DOXEPIN HCL PO Take by mouth.       [DISCONTINUED] GABAPENTIN, PHN, PO Take by mouth.       No current facility-administered medications on file prior to visit.    Allergies  Allergen Reactions   Naproxen Sodium Rash    anaprox    Social History:  reports that she has never smoked. She has never used smokeless tobacco. She reports that she does not drink alcohol and does not use drugs.  Family History  Problem Relation Age of Onset   Cancer Mother    Stroke Mother    Diabetes Mother    Hypertension Mother    Colon cancer Mother 61       died 06-25-2005   Gout Father    Heart  disease Father     The following portions of the patient's history were reviewed and updated as appropriate: allergies, current medications, past  family history, past medical history, past social history, past surgical history and problem list.  Review of Systems Pertinent items noted in HPI and remainder of comprehensive ROS otherwise negative.  Physical Exam:  There were no vitals taken for this visit. CONSTITUTIONAL: Well-developed, well-nourished female in no acute distress.  HENT:  Normocephalic, atraumatic, External right and left ear normal.  EYES: Conjunctivae and EOM are normal. Pupils are equal, round, and reactive to light. No scleral icterus.  NECK: Normal range of motion, supple, no masses.  Normal thyroid.  SKIN: Skin is warm and dry. No rash noted. Not diaphoretic. No erythema. No pallor. MUSCULOSKELETAL: Normal range of motion. No tenderness.  No cyanosis, clubbing, or edema. NEUROLOGIC: Alert and oriented to person, place, and time. Normal reflexes, muscle tone coordination.  PSYCHIATRIC: Normal mood and affect. Normal behavior. Normal judgment and thought content.  CARDIOVASCULAR: Normal heart rate noted, regular rhythm RESPIRATORY: Clear to auscultation bilaterally. Effort and breath sounds normal, no problems with respiration noted.  BREASTS: Symmetric in size. No masses, tenderness, skin changes, nipple drainage, or lymphadenopathy bilaterally. Performed in the presence of a chaperone. ABDOMEN: Soft, no distention noted.  No tenderness, rebound or guarding.  PELVIC: {PELVIC:22980}. Performed in the presence of a chaperone.  Assessment and Plan:    1. Encounter for annual routine gynecological examination *** - Cervical cancer screening: Discussed guidelines related to screening beyond 65. Based on guidelines, the patient meets criteria for cessation of pap smears.  Patient would like: {Blank single:19197::"to continue pap smears","to stop pap smears unless  indicated"} - Breast Health: Encouraged self-breast awareness/SBE. Discussed limits of clinical breast exam for detecting breast cancer. Discussed importance of annual MXR.  Rx given for MXR - Bone Health: Calcium via diet and supplementation. DEXA done of forearm in 2017 and normal. - Colonoscopy: {Blank single:19197::"Per PCP","up to date","declines"} - F/U 12 months and prn      Routine preventative health maintenance measures emphasized. Please refer to After Visit Summary for other counseling recommendations.       Milas Hock, MD, FACOG Obstetrician & Gynecologist, [facility], [facility]

## 2021-02-08 ENCOUNTER — Ambulatory Visit: Payer: Medicare Other | Admitting: Obstetrics and Gynecology

## 2021-03-27 NOTE — Progress Notes (Signed)
Mammogram 09/15/20. Bone density scheduled 04/19/21. No Pap on file or noted in Care Everywhere. Reports last pap was greater than 3 years.  Patient walks with a walker. Placed in procedure room with lower table.

## 2021-03-28 ENCOUNTER — Other Ambulatory Visit: Payer: Self-pay

## 2021-03-28 ENCOUNTER — Ambulatory Visit (INDEPENDENT_AMBULATORY_CARE_PROVIDER_SITE_OTHER): Payer: Medicare Other | Admitting: Advanced Practice Midwife

## 2021-03-28 ENCOUNTER — Other Ambulatory Visit (HOSPITAL_COMMUNITY)
Admission: RE | Admit: 2021-03-28 | Discharge: 2021-03-28 | Disposition: A | Discharge status: Home / Self Care | Payer: Medicare Other | Source: Ambulatory Visit | Attending: Advanced Practice Midwife | Admitting: Advanced Practice Midwife

## 2021-03-28 ENCOUNTER — Encounter: Payer: Self-pay | Admitting: Advanced Practice Midwife

## 2021-03-28 VITALS — BP 135/76 | HR 73 | Ht 61.0 in | Wt 196.0 lb

## 2021-03-28 DIAGNOSIS — Z1239 Encounter for other screening for malignant neoplasm of breast: Secondary | ICD-10-CM | POA: Diagnosis not present

## 2021-03-28 DIAGNOSIS — Z01419 Encounter for gynecological examination (general) (routine) without abnormal findings: Secondary | ICD-10-CM | POA: Diagnosis present

## 2021-03-28 DIAGNOSIS — Z1151 Encounter for screening for human papillomavirus (HPV): Secondary | ICD-10-CM | POA: Diagnosis not present

## 2021-03-28 DIAGNOSIS — Z124 Encounter for screening for malignant neoplasm of cervix: Secondary | ICD-10-CM

## 2021-03-28 NOTE — Progress Notes (Signed)
° °  Subjective:     Jodi Chavez is a 71 y.o. female here at [facility] for a routine exam.  Current complaints: no gyn concerns. Hx significant for diabetes, essential HTN, aortic artheroslerosis, osteoarthritis of hips.  Pap hx wnl per pt.  Pt with significant mobility concerns and uses a walker/cane at this time.  Personal health questionnaire reviewed: yes.  Do you have a primary care provider? yes Do you feel safe at home? yes  Flowsheet Row Nutrition from 07/12/2015 in [facility]  PHQ-2 Total Score 0       Health Maintenance Due  Topic Date Due   Hepatitis C Screening  Never done   COLONOSCOPY (Pts 45-35yrs Insurance coverage will need to be confirmed)  Never done   Zoster Vaccines- Shingrix (1 of 2) Never done   Pneumonia Vaccine 29+ Years old (2 - PCV) 03/22/2006   TETANUS/TDAP  03/23/2015   HEMOGLOBIN A1C  05/22/2015   COVID-19 Vaccine (3 - Booster for Pfizer series) 08/18/2019   INFLUENZA VACCINE  09/26/2020     Risk factors for chronic health problems: Smoking: Alchohol/how much: Pt BMI: There is no height or weight on file to calculate BMI.   Gynecologic History No LMP recorded. Patient is postmenopausal. Contraception: post menopausal status Last Pap: Pt unsure. Results were: normal Last mammogram: 09/15/20. Results were: normal  Obstetric History OB History  No obstetric history on file.     The following portions of the patient's history were reviewed and updated as appropriate: allergies, current medications, past family history, past medical history, past social history, past surgical history, and problem list.  Review of Systems Pertinent items noted in HPI and remainder of comprehensive ROS otherwise negative.    Objective:   There were no vitals taken for this visit. VS reviewed, nursing note reviewed,  Constitutional: well developed, well nourished, no distress HEENT: normocephalic CV: normal rate Pulm/chest  wall: normal effort Breast Exam:  exam performed: right breast normal without mass, skin or nipple changes or axillary nodes, left breast normal without mass, skin or nipple changes or axillary nodes Abdomen: soft Neuro: alert and oriented x 3 Skin: warm, dry Psych: affect normal Pelvic exam: Performed: Cervix pink, visually closed, without lesion, scant white creamy discharge, vaginal walls and external genitalia normal Bimanual exam: Cervix 0/long/high, firm, anterior, neg CMT, uterus nontender, nonenlarged, adnexa without tenderness, enlargement, or mass       Assessment/Plan:   1. Well woman exam with routine gynecological exam --No gyn concerns per pt or on exam today  2. Cervical cancer screening --Pap pending --Pt is 70yo with hx normal Pap, if Pap today wnl, no further Paps needed per [facility].  3. Breast cancer screening other than mammogram --Mammogram reviewed and wnl.     Sharen Counter, CNM 8:38 AM

## 2021-03-30 LAB — CYTOLOGY - PAP
Comment: NEGATIVE
Diagnosis: NEGATIVE
High risk HPV: NEGATIVE

## 2021-04-04 ENCOUNTER — Telehealth: Payer: Self-pay | Admitting: Advanced Practice Midwife

## 2021-04-04 NOTE — Telephone Encounter (Signed)
Called pt to notify her of normal Pap results.  Reviewed chart with Dr Roselie Awkward, and per guidelines, pt does not need to continue Pap testing.  She can follow up with her PCP and contact our office as needed. Pt states understanding.

## 2021-04-21 ENCOUNTER — Other Ambulatory Visit: Payer: Self-pay | Admitting: Internal Medicine

## 2021-04-23 LAB — URINE CULTURE
MICRO NUMBER:: 13054683
SPECIMEN QUALITY:: ADEQUATE

## 2021-04-24 ENCOUNTER — Ambulatory Visit
Admission: RE | Admit: 2021-04-24 | Discharge: 2021-04-24 | Disposition: A | Discharge status: Home / Self Care | Payer: Medicare Other | Source: Ambulatory Visit | Attending: Internal Medicine | Admitting: Internal Medicine

## 2021-04-24 DIAGNOSIS — E2839 Other primary ovarian failure: Secondary | ICD-10-CM

## 2021-05-08 ENCOUNTER — Ambulatory Visit: Payer: Medicare Other | Admitting: Podiatry

## 2021-05-30 ENCOUNTER — Ambulatory Visit (INDEPENDENT_AMBULATORY_CARE_PROVIDER_SITE_OTHER): Payer: Medicare Other | Admitting: Podiatry

## 2021-05-30 ENCOUNTER — Encounter: Payer: Self-pay | Admitting: Podiatry

## 2021-05-30 DIAGNOSIS — B351 Tinea unguium: Secondary | ICD-10-CM | POA: Diagnosis not present

## 2021-05-30 DIAGNOSIS — M79674 Pain in right toe(s): Secondary | ICD-10-CM | POA: Diagnosis not present

## 2021-05-30 DIAGNOSIS — M79675 Pain in left toe(s): Secondary | ICD-10-CM

## 2021-05-30 DIAGNOSIS — E1140 Type 2 diabetes mellitus with diabetic neuropathy, unspecified: Secondary | ICD-10-CM

## 2021-05-30 NOTE — Progress Notes (Signed)
This patient returns to my office for at risk foot care.  This patient requires this care by a professional since this patient will be at risk due to having diabetes.    This patient is unable to cut nails herself since the patient cannot reach her nails.These nails are painful walking and wearing shoes.  This patient presents for at risk foot care today.  General Appearance  Alert, conversant and in no acute stress.  Vascular  Dorsalis pedis and posterior tibial  pulses are not  palpable due to swelling bilaterally.  Capillary return is within normal limits  bilaterally. Temperature is within normal limits  bilaterally.  Neurologic  Senn-Weinstein monofilament wire test within normal limits  bilaterally. Muscle power within normal limits bilaterally.  Nails Thick disfigured discolored nails with subungual debris  3-5 left and 2-5 right.. No evidence of bacterial infection or drainage bilaterally.  Orthopedic  No limitations of motion  feet .  No crepitus or effusions noted.  No bony pathology or digital deformities noted.  Skin  normotropic skin with no porokeratosis noted bilaterally.  No signs of infections or ulcers noted.   Callus sub 1st MPJ right foot.  Onychomycosis  Pain in right toes  Pain in left toes  Consent was obtained for treatment procedures.   Mechanical debridement of nails 1-5  bilaterally performed with a nail nipper.  Filed with dremel without incident.    Return office visit   3 months                   Told patient to return for periodic foot care and evaluation due to potential at risk complications.   Saja Bartolini DPM  

## 2021-09-05 ENCOUNTER — Ambulatory Visit: Payer: Medicare Other | Admitting: Podiatry

## 2021-10-10 ENCOUNTER — Ambulatory Visit (INDEPENDENT_AMBULATORY_CARE_PROVIDER_SITE_OTHER): Payer: Medicare Other | Admitting: Podiatry

## 2021-10-10 DIAGNOSIS — N179 Acute kidney failure, unspecified: Secondary | ICD-10-CM | POA: Diagnosis not present

## 2021-10-10 DIAGNOSIS — M79674 Pain in right toe(s): Secondary | ICD-10-CM | POA: Diagnosis not present

## 2021-10-10 DIAGNOSIS — M79675 Pain in left toe(s): Secondary | ICD-10-CM

## 2021-10-10 DIAGNOSIS — E1140 Type 2 diabetes mellitus with diabetic neuropathy, unspecified: Secondary | ICD-10-CM

## 2021-10-10 DIAGNOSIS — E1149 Type 2 diabetes mellitus with other diabetic neurological complication: Secondary | ICD-10-CM | POA: Diagnosis not present

## 2021-10-10 DIAGNOSIS — B351 Tinea unguium: Secondary | ICD-10-CM

## 2021-10-10 DIAGNOSIS — L84 Corns and callosities: Secondary | ICD-10-CM

## 2021-10-10 NOTE — Progress Notes (Signed)
This patient returns to my office for at risk foot care.  This patient requires this care by a professional since this patient will be at risk due to having diabetes.    This patient is unable to cut nails herself since the patient cannot reach her nails.These nails are painful walking and wearing shoes.  This patient presents for at risk foot care today.  General Appearance  Alert, conversant and in no acute stress.  Vascular  Dorsalis pedis and posterior tibial  pulses are not  palpable due to swelling bilaterally.  Capillary return is within normal limits  bilaterally. Temperature is within normal limits  bilaterally.  Neurologic  Senn-Weinstein monofilament wire test within normal limits  bilaterally. Muscle power within normal limits bilaterally.  Nails Thick disfigured discolored nails with subungual debris  3-5 left and 2-5 right.. No evidence of bacterial infection or drainage bilaterally.  Orthopedic  No limitations of motion  feet .  No crepitus or effusions noted.  No bony pathology or digital deformities noted.  Skin  normotropic skin with no porokeratosis noted bilaterally.  No signs of infections or ulcers noted.   Callus sub 1st MPJ right foot.  Onychomycosis  Pain in right toes  Pain in left toes  Consent was obtained for treatment procedures.   Mechanical debridement of nails 1-5  bilaterally performed with a nail nipper.  Filed with dremel without incident.    Return office visit   3 months                   Told patient to return for periodic foot care and evaluation due to potential at risk complications.   Blanka Rockholt DPM  

## 2021-12-06 ENCOUNTER — Telehealth: Payer: Self-pay | Admitting: Podiatry

## 2021-12-06 NOTE — Telephone Encounter (Signed)
Spoke with patient she is fine with changing her appt time to 115pm  on  01/16/22.

## 2022-01-16 ENCOUNTER — Ambulatory Visit: Payer: Medicare Other | Admitting: Podiatry

## 2022-01-30 ENCOUNTER — Ambulatory Visit (INDEPENDENT_AMBULATORY_CARE_PROVIDER_SITE_OTHER): Payer: Medicare Other | Admitting: Podiatry

## 2022-01-30 ENCOUNTER — Encounter: Payer: Self-pay | Admitting: Podiatry

## 2022-01-30 VITALS — BP 173/73

## 2022-01-30 DIAGNOSIS — B351 Tinea unguium: Secondary | ICD-10-CM

## 2022-01-30 DIAGNOSIS — N179 Acute kidney failure, unspecified: Secondary | ICD-10-CM

## 2022-01-30 DIAGNOSIS — M79674 Pain in right toe(s): Secondary | ICD-10-CM | POA: Diagnosis not present

## 2022-01-30 DIAGNOSIS — E1140 Type 2 diabetes mellitus with diabetic neuropathy, unspecified: Secondary | ICD-10-CM | POA: Diagnosis not present

## 2022-01-30 DIAGNOSIS — E1149 Type 2 diabetes mellitus with other diabetic neurological complication: Secondary | ICD-10-CM

## 2022-01-30 DIAGNOSIS — L84 Corns and callosities: Secondary | ICD-10-CM

## 2022-01-30 DIAGNOSIS — M79675 Pain in left toe(s): Secondary | ICD-10-CM

## 2022-01-30 NOTE — Progress Notes (Signed)
This patient returns to my office for at risk foot care.  This patient requires this care by a professional since this patient will be at risk due to having diabetes.    This patient is unable to cut nails herself since the patient cannot reach her nails.These nails are painful walking and wearing shoes.  This patient presents for at risk foot care today.  General Appearance  Alert, conversant and in no acute stress.  Vascular  Dorsalis pedis and posterior tibial  pulses are not  palpable due to swelling bilaterally.  Capillary return is within normal limits  bilaterally. Temperature is within normal limits  bilaterally.  Neurologic  Senn-Weinstein monofilament wire test within normal limits  bilaterally. Muscle power within normal limits bilaterally.  Nails Thick disfigured discolored nails with subungual debris  3-5 left and 2-5 right.. No evidence of bacterial infection or drainage bilaterally.  Orthopedic  No limitations of motion  feet .  No crepitus or effusions noted.  No bony pathology or digital deformities noted.  Skin  normotropic skin with no porokeratosis noted bilaterally.  No signs of infections or ulcers noted.   Callus sub 1st MPJ right foot.  Onychomycosis  Pain in right toes  Pain in left toes  Consent was obtained for treatment procedures.   Mechanical debridement of nails 1-5  bilaterally performed with a nail nipper.  Filed with dremel without incident.    Return office visit   3 months                   Told patient to return for periodic foot care and evaluation due to potential at risk complications.   Helane Gunther DPM

## 2022-02-06 ENCOUNTER — Encounter (INDEPENDENT_AMBULATORY_CARE_PROVIDER_SITE_OTHER): Payer: Medicare Other | Admitting: Ophthalmology

## 2022-03-26 ENCOUNTER — Other Ambulatory Visit: Payer: Self-pay | Admitting: Internal Medicine

## 2022-03-27 LAB — COMPLETE METABOLIC PANEL WITH GFR
AG Ratio: 1.5 (calc) (ref 1.0–2.5)
ALT: 21 U/L (ref 6–29)
AST: 24 U/L (ref 10–35)
Albumin: 4.6 g/dL (ref 3.6–5.1)
Alkaline phosphatase (APISO): 75 U/L (ref 37–153)
BUN/Creatinine Ratio: 16 (calc) (ref 6–22)
BUN: 20 mg/dL (ref 7–25)
CO2: 28 mmol/L (ref 20–32)
Calcium: 10.2 mg/dL (ref 8.6–10.4)
Chloride: 90 mmol/L — ABNORMAL LOW (ref 98–110)
Creat: 1.23 mg/dL — ABNORMAL HIGH (ref 0.60–1.00)
Globulin: 3 g/dL (calc) (ref 1.9–3.7)
Glucose, Bld: 117 mg/dL — ABNORMAL HIGH (ref 65–99)
Potassium: 4.1 mmol/L (ref 3.5–5.3)
Sodium: 132 mmol/L — ABNORMAL LOW (ref 135–146)
Total Bilirubin: 0.4 mg/dL (ref 0.2–1.2)
Total Protein: 7.6 g/dL (ref 6.1–8.1)
eGFR: 47 mL/min/{1.73_m2} — ABNORMAL LOW (ref >=60)

## 2022-03-27 LAB — CBC
HCT: 41.1 % (ref 35.0–45.0)
Hemoglobin: 14.1 g/dL (ref 11.7–15.5)
MCH: 33 pg (ref 27.0–33.0)
MCHC: 34.3 g/dL (ref 32.0–36.0)
MCV: 96.3 fL (ref 80.0–100.0)
MPV: 10.6 fL (ref 7.5–12.5)
Platelets: 247 10*3/uL (ref 140–400)
RBC: 4.27 10*6/uL (ref 3.80–5.10)
RDW: 12.9 % (ref 11.0–15.0)
WBC: 7.2 10*3/uL (ref 3.8–10.8)

## 2022-03-27 LAB — LIPID PANEL
Cholesterol: 132 mg/dL (ref <200)
HDL: 36 mg/dL — ABNORMAL LOW (ref >=50)
LDL Cholesterol (Calc): 59 mg/dL (calc)
Non-HDL Cholesterol (Calc): 96 mg/dL (calc) (ref <130)
Total CHOL/HDL Ratio: 3.7 (calc) (ref <5.0)
Triglycerides: 349 mg/dL — ABNORMAL HIGH (ref <150)

## 2022-03-27 LAB — TSH: TSH: 2.39 mIU/L (ref 0.40–4.50)

## 2022-03-27 LAB — FOLATE: Folate: >24 ng/mL

## 2022-03-27 LAB — VITAMIN D 25 HYDROXY (VIT D DEFICIENCY, FRACTURES): Vit D, 25-Hydroxy: 60 ng/mL (ref 30–100)

## 2022-03-27 LAB — VITAMIN B12: Vitamin B-12: 605 pg/mL (ref 200–1100)

## 2022-05-01 ENCOUNTER — Ambulatory Visit: Payer: Medicare Other | Admitting: Podiatry

## 2022-05-27 ENCOUNTER — Inpatient Hospital Stay
Admission: EM | Admit: 2022-05-27 | Discharge: 2022-05-29 | DRG: 193 | Disposition: A | Discharge status: Home / Self Care | Payer: Medicare Other | Attending: Internal Medicine | Admitting: Internal Medicine

## 2022-05-27 ENCOUNTER — Emergency Department: Payer: Medicare Other

## 2022-05-27 ENCOUNTER — Emergency Department: Admission: EM | Admit: 2022-05-27 | Payer: Self-pay | Source: Home / Self Care

## 2022-05-27 ENCOUNTER — Observation Stay: Payer: Medicare Other

## 2022-05-27 DIAGNOSIS — I10 Essential (primary) hypertension: Secondary | ICD-10-CM | POA: Diagnosis present

## 2022-05-27 DIAGNOSIS — Z1152 Encounter for screening for COVID-19: Secondary | ICD-10-CM

## 2022-05-27 DIAGNOSIS — E1169 Type 2 diabetes mellitus with other specified complication: Secondary | ICD-10-CM

## 2022-05-27 DIAGNOSIS — J9601 Acute respiratory failure with hypoxia: Secondary | ICD-10-CM

## 2022-05-27 DIAGNOSIS — N179 Acute kidney failure, unspecified: Secondary | ICD-10-CM | POA: Diagnosis present

## 2022-05-27 DIAGNOSIS — R55 Syncope and collapse: Secondary | ICD-10-CM | POA: Diagnosis not present

## 2022-05-27 DIAGNOSIS — E1122 Type 2 diabetes mellitus with diabetic chronic kidney disease: Secondary | ICD-10-CM | POA: Diagnosis present

## 2022-05-27 DIAGNOSIS — F32A Depression, unspecified: Secondary | ICD-10-CM | POA: Diagnosis present

## 2022-05-27 DIAGNOSIS — N1831 Chronic kidney disease, stage 3a: Secondary | ICD-10-CM | POA: Diagnosis present

## 2022-05-27 DIAGNOSIS — Z7984 Long term (current) use of oral hypoglycemic drugs: Secondary | ICD-10-CM

## 2022-05-27 DIAGNOSIS — I129 Hypertensive chronic kidney disease with stage 1 through stage 4 chronic kidney disease, or unspecified chronic kidney disease: Secondary | ICD-10-CM | POA: Diagnosis present

## 2022-05-27 DIAGNOSIS — E669 Obesity, unspecified: Secondary | ICD-10-CM | POA: Diagnosis present

## 2022-05-27 DIAGNOSIS — E785 Hyperlipidemia, unspecified: Secondary | ICD-10-CM | POA: Diagnosis present

## 2022-05-27 DIAGNOSIS — Z800 Family history of malignant neoplasm of digestive organs: Secondary | ICD-10-CM

## 2022-05-27 DIAGNOSIS — Z833 Family history of diabetes mellitus: Secondary | ICD-10-CM

## 2022-05-27 DIAGNOSIS — N183 Chronic kidney disease, stage 3 unspecified: Secondary | ICD-10-CM | POA: Diagnosis present

## 2022-05-27 DIAGNOSIS — E119 Type 2 diabetes mellitus without complications: Secondary | ICD-10-CM

## 2022-05-27 DIAGNOSIS — J189 Pneumonia, unspecified organism: Secondary | ICD-10-CM | POA: Diagnosis not present

## 2022-05-27 DIAGNOSIS — N1832 Chronic kidney disease, stage 3b: Secondary | ICD-10-CM | POA: Diagnosis present

## 2022-05-27 DIAGNOSIS — J181 Lobar pneumonia, unspecified organism: Principal | ICD-10-CM | POA: Diagnosis present

## 2022-05-27 DIAGNOSIS — R0602 Shortness of breath: Secondary | ICD-10-CM | POA: Diagnosis not present

## 2022-05-27 DIAGNOSIS — Z6839 Body mass index (BMI) 39.0-39.9, adult: Secondary | ICD-10-CM

## 2022-05-27 DIAGNOSIS — G629 Polyneuropathy, unspecified: Secondary | ICD-10-CM | POA: Diagnosis present

## 2022-05-27 DIAGNOSIS — Z8249 Family history of ischemic heart disease and other diseases of the circulatory system: Secondary | ICD-10-CM

## 2022-05-27 DIAGNOSIS — Z823 Family history of stroke: Secondary | ICD-10-CM

## 2022-05-27 HISTORY — DX: Acute respiratory failure with hypoxia: J96.01

## 2022-05-27 LAB — URINALYSIS, ROUTINE W REFLEX MICROSCOPIC
Bilirubin Urine: NEGATIVE
Glucose, UA: NEGATIVE mg/dL
Hgb urine dipstick: NEGATIVE
Ketones, ur: NEGATIVE mg/dL
Leukocytes,Ua: NEGATIVE
Nitrite: NEGATIVE
Protein, ur: NEGATIVE mg/dL
Specific Gravity, Urine: 1.009 (ref 1.005–1.030)
pH: 7 (ref 5.0–8.0)

## 2022-05-27 LAB — CBC WITH DIFFERENTIAL/PLATELET
Abs Immature Granulocytes: 0.04 10*3/uL (ref 0.00–0.07)
Basophils Absolute: 0.1 10*3/uL (ref 0.0–0.1)
Basophils Relative: 1 %
Eosinophils Absolute: 0.1 10*3/uL (ref 0.0–0.5)
Eosinophils Relative: 1 %
HCT: 37.1 % (ref 36.0–46.0)
Hemoglobin: 12.8 g/dL (ref 12.0–15.0)
Immature Granulocytes: 0 %
Lymphocytes Relative: 13 %
Lymphs Abs: 1.4 10*3/uL (ref 0.7–4.0)
MCH: 33 pg (ref 26.0–34.0)
MCHC: 34.5 g/dL (ref 30.0–36.0)
MCV: 95.6 fL (ref 80.0–100.0)
Monocytes Absolute: 0.7 10*3/uL (ref 0.1–1.0)
Monocytes Relative: 7 %
Neutro Abs: 8.2 10*3/uL — ABNORMAL HIGH (ref 1.7–7.7)
Neutrophils Relative %: 78 %
Platelets: 241 10*3/uL (ref 150–400)
RBC: 3.88 MIL/uL (ref 3.87–5.11)
RDW: 12.8 % (ref 11.5–15.5)
WBC: 10.6 10*3/uL — ABNORMAL HIGH (ref 4.0–10.5)
nRBC: 0 % (ref 0.0–0.2)

## 2022-05-27 LAB — COMPREHENSIVE METABOLIC PANEL
ALT: 20 U/L (ref 0–44)
AST: 31 U/L (ref 15–41)
Albumin: 4.2 g/dL (ref 3.5–5.0)
Alkaline Phosphatase: 72 U/L (ref 38–126)
Anion gap: 12 (ref 5–15)
BUN: 24 mg/dL — ABNORMAL HIGH (ref 8–23)
CO2: 29 mmol/L (ref 22–32)
Calcium: 9.5 mg/dL (ref 8.9–10.3)
Chloride: 91 mmol/L — ABNORMAL LOW (ref 98–111)
Creatinine, Ser: 1.25 mg/dL — ABNORMAL HIGH (ref 0.44–1.00)
GFR, Estimated: 46 mL/min — ABNORMAL LOW (ref >60)
Glucose, Bld: 159 mg/dL — ABNORMAL HIGH (ref 70–99)
Potassium: 3.9 mmol/L (ref 3.5–5.1)
Sodium: 132 mmol/L — ABNORMAL LOW (ref 135–145)
Total Bilirubin: 0.6 mg/dL (ref 0.3–1.2)
Total Protein: 7 g/dL (ref 6.5–8.1)

## 2022-05-27 LAB — RESP PANEL BY RT-PCR (RSV, FLU A&B, COVID)  RVPGX2
Influenza A by PCR: NEGATIVE
Influenza B by PCR: NEGATIVE
Resp Syncytial Virus by PCR: NEGATIVE
SARS Coronavirus 2 by RT PCR: NEGATIVE

## 2022-05-27 LAB — PROCALCITONIN: Procalcitonin: <0.1 ng/mL

## 2022-05-27 LAB — GLUCOSE, CAPILLARY: Glucose-Capillary: 157 mg/dL — ABNORMAL HIGH (ref 70–99)

## 2022-05-27 LAB — STREP PNEUMONIAE URINARY ANTIGEN: Strep Pneumo Urinary Antigen: NEGATIVE

## 2022-05-27 LAB — TROPONIN I (HIGH SENSITIVITY): Troponin I (High Sensitivity): 6 ng/L (ref <18)

## 2022-05-27 MED ORDER — BENZONATATE 100 MG PO CAPS
200.0000 mg | ORAL_CAPSULE | Freq: Three times a day (TID) | ORAL | Status: DC
  Administered 2022-05-27 – 2022-05-29 (×6): 200 mg via ORAL
  Filled 2022-05-27 (×6): qty 2

## 2022-05-27 MED ORDER — METOPROLOL SUCCINATE ER 25 MG PO TB24: 25.0000 mg | ORAL_TABLET | Freq: Every day | ORAL | Status: DC

## 2022-05-27 MED ORDER — CLONIDINE HCL 0.1 MG PO TABS
0.1000 mg | ORAL_TABLET | Freq: Every day | ORAL | Status: DC
  Administered 2022-05-27 – 2022-05-28 (×2): 0.1 mg via ORAL
  Filled 2022-05-27 (×2): qty 1

## 2022-05-27 MED ORDER — SODIUM CHLORIDE 0.9 % IV SOLN
2.0000 g | INTRAVENOUS | Status: DC
  Administered 2022-05-28 – 2022-05-29 (×2): 2 g via INTRAVENOUS
  Filled 2022-05-27 (×2): qty 2

## 2022-05-27 MED ORDER — ASPIRIN 81 MG PO TBEC
81.0000 mg | DELAYED_RELEASE_TABLET | Freq: Every day | ORAL | Status: DC
  Administered 2022-05-28 – 2022-05-29 (×2): 81 mg via ORAL
  Filled 2022-05-27 (×2): qty 1

## 2022-05-27 MED ORDER — PANTOPRAZOLE SODIUM 40 MG PO TBEC
40.0000 mg | DELAYED_RELEASE_TABLET | Freq: Every day | ORAL | Status: DC
  Administered 2022-05-28 – 2022-05-29 (×2): 40 mg via ORAL
  Filled 2022-05-27 (×2): qty 1

## 2022-05-27 MED ORDER — BENAZEPRIL HCL 20 MG PO TABS: 40.0000 mg | ORAL_TABLET | Freq: Every day | ORAL | Status: DC

## 2022-05-27 MED ORDER — SODIUM CHLORIDE 0.9 % IV SOLN
1.0000 g | Freq: Once | INTRAVENOUS | Status: AC
  Administered 2022-05-27: 1 g via INTRAVENOUS
  Filled 2022-05-27: qty 10

## 2022-05-27 MED ORDER — SODIUM CHLORIDE 0.9 % IV SOLN
500.0000 mg | INTRAVENOUS | Status: DC
  Administered 2022-05-28: 500 mg via INTRAVENOUS
  Filled 2022-05-27: qty 500
  Filled 2022-05-27: qty 5

## 2022-05-27 MED ORDER — ENOXAPARIN SODIUM 60 MG/0.6ML IJ SOSY
0.5000 mg/kg | PREFILLED_SYRINGE | INTRAMUSCULAR | Status: DC
  Administered 2022-05-27 – 2022-05-28 (×2): 47.5 mg via SUBCUTANEOUS
  Filled 2022-05-27 (×2): qty 0.6

## 2022-05-27 MED ORDER — GUAIFENESIN-DM 100-10 MG/5ML PO SYRP: 5.0000 mL | ORAL_SOLUTION | ORAL | Status: DC | PRN

## 2022-05-27 MED ORDER — LORATADINE 10 MG PO TABS
10.0000 mg | ORAL_TABLET | Freq: Every day | ORAL | Status: DC
  Administered 2022-05-27 – 2022-05-29 (×3): 10 mg via ORAL
  Filled 2022-05-27 (×3): qty 1

## 2022-05-27 MED ORDER — INSULIN ASPART 100 UNIT/ML IJ SOLN: 0.0000 [IU] | Freq: Every day | INTRAMUSCULAR | Status: DC

## 2022-05-27 MED ORDER — ONDANSETRON HCL 4 MG/2ML IJ SOLN
4.0000 mg | Freq: Once | INTRAMUSCULAR | Status: AC
  Administered 2022-05-27: 4 mg via INTRAVENOUS
  Filled 2022-05-27: qty 2

## 2022-05-27 MED ORDER — INSULIN ASPART 100 UNIT/ML IJ SOLN
3.0000 [IU] | Freq: Three times a day (TID) | INTRAMUSCULAR | Status: DC
  Administered 2022-05-28 – 2022-05-29 (×5): 3 [IU] via SUBCUTANEOUS
  Filled 2022-05-27 (×4): qty 1

## 2022-05-27 MED ORDER — METOPROLOL SUCCINATE ER 25 MG PO TB24
25.0000 mg | ORAL_TABLET | Freq: Every day | ORAL | Status: DC
  Administered 2022-05-28 – 2022-05-29 (×2): 25 mg via ORAL
  Filled 2022-05-27 (×2): qty 1

## 2022-05-27 MED ORDER — SODIUM CHLORIDE 0.9 % IV BOLUS
500.0000 mL | Freq: Once | INTRAVENOUS | Status: AC
  Administered 2022-05-27: 500 mL via INTRAVENOUS

## 2022-05-27 MED ORDER — ONDANSETRON HCL 4 MG/2ML IJ SOLN
4.0000 mg | Freq: Four times a day (QID) | INTRAMUSCULAR | Status: DC | PRN
  Filled 2022-05-27: qty 2

## 2022-05-27 MED ORDER — SODIUM CHLORIDE 0.9 % IV SOLN
500.0000 mg | Freq: Once | INTRAVENOUS | Status: AC
  Administered 2022-05-27: 500 mg via INTRAVENOUS
  Filled 2022-05-27: qty 5

## 2022-05-27 MED ORDER — EZETIMIBE 10 MG PO TABS
10.0000 mg | ORAL_TABLET | Freq: Every day | ORAL | Status: DC
  Administered 2022-05-28 – 2022-05-29 (×2): 10 mg via ORAL
  Filled 2022-05-27 (×2): qty 1

## 2022-05-27 MED ORDER — INSULIN ASPART 100 UNIT/ML IJ SOLN
0.0000 [IU] | Freq: Three times a day (TID) | INTRAMUSCULAR | Status: DC
  Administered 2022-05-28: 1 [IU] via SUBCUTANEOUS
  Administered 2022-05-29: 2 [IU] via SUBCUTANEOUS
  Filled 2022-05-27 (×2): qty 1

## 2022-05-27 MED ORDER — SIMVASTATIN 20 MG PO TABS
40.0000 mg | ORAL_TABLET | Freq: Every day | ORAL | Status: DC
  Administered 2022-05-27 – 2022-05-28 (×2): 40 mg via ORAL
  Filled 2022-05-27 (×2): qty 2

## 2022-05-27 MED ORDER — ENOXAPARIN SODIUM 60 MG/0.6ML IJ SOSY: 0.5000 mg/kg | PREFILLED_SYRINGE | INTRAMUSCULAR | Status: DC

## 2022-05-27 NOTE — H&P (Addendum)
History and Physical    Patient: Jodi Chavez [id number] DOB: Jan 22, 1951 DOA: 05/27/2022 DOS: the patient was seen and examined on 05/27/2022 PCP: Nolene Ebbs, MD  Patient coming from:  church   Chief Complaint:  Chief Complaint  Patient presents with   Near Syncope   HPI: Jodi Chavez is a 72 y.o. female with medical history significant of constipation, type 2 diabetes, hyperlipidemia, hypertension, peripheral edema presented with near syncope, acute respiratory failure with hypoxia and pneumonia.  Per report, patient was at church when she had episode of significant lightheadedness dizziness and feeling and nausea while standing.  Patient felt extremely dizzy Phlexy almost passed out.  No reported head trauma loss consciousness.  Had a prior episode of near syncope approximate 2 years ago.  No recent infections.  No fevers or chills.  Does report having some cough that has been somewhat of a lingering issue.  No reported tobacco use.  No reported prior history of asthma.  No abdominal pain or diarrhea.  No hemiparesis or confusion.  Was noted to have been evaluated by EMS and found to be hypoxic transiently. Presented to the ER afebrile, hemodynamically stable.  Transition to nasal cannula to keep O2 sats greater than 90%.  White count 10.6, hemoglobin 13, urinalysis grossly stable, COVID, RSV and flu negative.  Creatinine 1.25.  Chest x-ray with left lung pneumonia. Review of Systems: As mentioned in the history of present illness. All other systems reviewed and are negative. Past Medical History:  Diagnosis Date   Arthritis    hip   Constipation    uses laxatives several times a week   Depression    Diabetes mellitus    takes Metformin and Amaryl daily   Dizziness    occasionally and related to meds    Early cataracts, bilateral    History of bronchitis    last time several yrs ago   History of migraine    many yrs ago   Hyperlipidemia    takes Zetia and Zocor daily    Hypertension    takes Benazepril nightly and Propranolol tid and Clonidine daily   Insomnia    Low back pain    Peripheral edema    Peripheral neuropathy    PONV (postoperative nausea and vomiting)    Slow urinary stream    occasionally   Past Surgical History:  Procedure Laterality Date   COLONOSCOPY     d&c/hysteroscopy/ablation     DILATION AND CURETTAGE OF UTERUS     couple of times   ESOPHAGOGASTRODUODENOSCOPY     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 She has never used smokeless tobacco. She reports that she does not drink alcohol and does not use drugs.  Allergies  Allergen Reactions   Naproxen Sodium Rash    anaprox    Family History  Problem Relation Age of Onset   Cancer Mother    Stroke Mother    Diabetes Mother    Hypertension Mother    Colon cancer Mother 4       died 2005/06/18   Gout Father    Heart disease Father  Prior to Admission medications   Medication Sig Start Date End Date Taking? Authorizing Provider  amLODipine (NORVASC) 5 MG tablet Take 1 tablet by mouth daily. 10/13/19   [provider]  ascorbic acid (VITAMIN C) 500 MG tablet Take 1 tablet by mouth daily. 10/13/19   [provider]  aspirin 81 MG EC tablet Take by mouth.    [provider]  benazepril (LOTENSIN) 40 MG tablet Take by mouth. 05/18/15   [provider]  calcium-vitamin D (OSCAL WITH D) 500-200 MG-UNIT TABS tablet Take 1 tablet by mouth daily.    [provider]  cetirizine (ZYRTEC) 10 MG tablet Take 10 mg by mouth daily as needed. 01/02/18   [provider]   cloNIDine (CATAPRES) 0.2 MG tablet Take 0.2 mg by mouth 2 (two) times daily.     [provider]  cloNIDine (CATAPRES) 0.2 MG tablet Take by mouth. 05/18/15   [provider]  colchicine 0.6 MG tablet Take 1 tablet by mouth daily. 10/01/19   [provider]  diphenhydrAMINE (BENADRYL) 25 MG tablet Take 1 tablet (25 mg total) by mouth every 6 (six) hours as needed for itching. 11/25/14   Rai, Vernelle Emerald, MD  diphenhydrAMINE (SOMINEX) 25 MG tablet Take by mouth. 11/25/14   [provider]  ezetimibe (ZETIA) 10 MG tablet Take by mouth. 05/18/15   [provider]  fluticasone (FLONASE) 50 MCG/ACT nasal spray Place into the nose. 04/18/15   [provider]  furosemide (LASIX) 20 MG tablet TAKE 1 TABLET BY MOUTH ONCE DAILY AS NEEDED FOR LEG SWELLING 03/02/18   [provider]  gabapentin (NEURONTIN) 300 MG capsule Take 300 mg by mouth 3 (three) times daily.    [provider]  Garlic (ODOR FREE GARLIC-X) [id number] MG TABS Take by mouth.    [provider]  Garlic 123XX123 MG CAPS Take 5,000 mg by mouth daily after breakfast.     [provider]  glimepiride (AMARYL) 2 MG tablet Take 2 mg by mouth daily before breakfast.    [provider]  Glucosamine-Chondroit-Vit C-Mn (GLUCOSAMINE-CHONDROITIN) CAPS Take 1 tablet by mouth daily.    [provider]  Glucosamine-Chondroitin 500-400 MG CAPS Take 1 tablet by mouth daily.    [provider]  guaiFENesin-codeine (ROBITUSSIN AC) 100-10 MG/5ML syrup 1 TSP PO Q 8 HRS PRN COUGH 07/26/20   [provider]  hydrochlorothiazide (HYDRODIURIL) 25 MG tablet Take 25 mg by mouth daily after breakfast.     [provider]  magnesium oxide (MAG-OX) 400 (241.3 Mg) MG tablet Take 0.5 tablets (200 mg total) by mouth 2 (two) times daily. 10/12/19   Regalado, Belkys A, MD  metFORMIN (GLUCOPHAGE) 850 MG tablet Take 850 mg by mouth daily with breakfast.    [provider]  Multiple Vitamin (MULITIVITAMIN WITH MINERALS) TABS Take 1 tablet by mouth daily after breakfast.     [provider]  nabumetone (RELAFEN) 500 MG tablet TAKE 1 TABLET BY MOUTH 2-3 TIMES DAILY AS NEEDED FOR INFLAMMATION 10/31/17   Mcarthur Rossetti, MD  ondansetron (ZOFRAN) 4 MG tablet Take by mouth. 10/12/19   [provider]  OVER THE COUNTER MEDICATION Apply 1 application topically daily. sween cream applied to groin area daily to prevent rash    [provider]  pantoprazole (PROTONIX) 40 MG tablet Take 1 tablet (40 mg total) by mouth daily. 11/25/14   Rai, Vernelle Emerald, MD  polyethylene glycol powder (GLYCOLAX/MIRALAX) 17 GM/SCOOP  powder Take by mouth. 10/13/19   [provider]  polyethylene glycol powder (GLYCOLAX/MIRALAX) 17 GM/SCOOP powder Take by mouth. 10/13/19   [provider]  predniSONE (DELTASONE) 5 MG tablet 6 DAY TAPER, TAKE AS DIRECTED WITH FOOD 07/26/20   [provider]  PROAIR HFA 108 (90 BASE) MCG/ACT inhaler Inhale 2 puffs into the lungs every 4 (four) hours as needed for wheezing or shortness of breath.  06/11/14   [provider]  promethazine (PHENERGAN) 25 MG tablet Take 1 tablet (25 mg total) by mouth every 6 (six) hours as needed for nausea. 07/20/17   Molpus, John, MD  propranolol (INDERAL) 20 MG tablet Take by mouth. 11/29/19   [provider]  Sennosides 25 MG TABS Take by mouth.    [provider]  simvastatin (ZOCOR) 40 MG tablet Take by mouth. 05/18/15   [provider]  traMADol (ULTRAM) 50 MG tablet TAKE ONE-HALF TO ONE TABLET BY MOUTH EVERY 4 TO 6 HOURS AS NEEDED FOR PAIN 04/17/16   Jessy Oto, MD  zinc sulfate 220 (50 Zn) MG capsule Take 1 capsule by mouth daily. 10/13/19   [provider]  DOXEPIN HCL PO Take by mouth.    05/10/11  [provider]  GABAPENTIN, PHN, PO Take by mouth.    05/10/11  [provider]    Physical  Exam: Vitals:   05/27/22 1400 05/27/22 1430 05/27/22 1500 05/27/22 1545  BP: 136/66 126/66 137/84 (!) 151/68  Pulse: 78 76 80 87  Resp: 20 17 (!) 21 19  Temp:      TempSrc:      SpO2: 99% 97% 100% 100%   Physical Exam Constitutional:      General: She is not in acute distress.    Appearance: She is obese.  HENT:     Head: Normocephalic and atraumatic.  Eyes:     Pupils: Pupils are equal, round, and reactive to light.  Cardiovascular:     Rate and Rhythm: Normal rate and regular rhythm.  Pulmonary:     Effort: Pulmonary effort is normal.     Breath sounds: No wheezing or rales.     Comments: Positive coughing Abdominal:     General: Bowel sounds are normal.  Musculoskeletal:        General: Normal range of motion.     Cervical back: Normal range of motion and neck supple.  Skin:    General: Skin is warm.  Neurological:     General: No focal deficit present.  Psychiatric:        Mood and Affect: Mood normal.     Data Reviewed:  There are no new results to review at this time. DG Chest 2 View CLINICAL DATA:  Hypoxia  EXAM: CHEST - 2 VIEW  COMPARISON:  CXR 10/07/19  FINDINGS: No pleural effusion. No pneumothorax. Low lung volumes. There is hazy opacity at the left lung base which could be seen with atelectasis or infection. No radiographically apparent displaced rib fractures. Visualized upper abdomen is unremarkable. Vertebral body heights are maintained.  IMPRESSION: Low lung volumes with hazy opacity at the left lung base which could be seen with atelectasis or infection.  Electronically Signed   By: Marin Roberts M.D.   On: 05/27/2022 13:32  Lab Results  Component Value Date   WBC 10.6 (H) 05/27/2022   HGB 12.8 05/27/2022   HCT 37.1 05/27/2022   MCV 95.6 05/27/2022   PLT 241 [id number]   Last metabolic  panel Lab Results  Component Value Date   GLUCOSE 159 (H) 05/27/2022   NA 132 (L) 05/27/2022   K 3.9 05/27/2022   CL 91 (L) 05/27/2022    CO2 29 05/27/2022   BUN 24 (H) 05/27/2022   CREATININE 1.25 (H) 05/27/2022   GFRNONAA 46 (L) 05/27/2022   CALCIUM 9.5 05/27/2022   PHOS 3.8 10/12/2019   PROT 7.0 05/27/2022   ALBUMIN 4.2 05/27/2022   BILITOT 0.6 05/27/2022   ALKPHOS 72 05/27/2022   AST 31 05/27/2022   ALT 20 05/27/2022   ANIONGAP 12 05/27/2022  \ Assessment and Plan: * Pneumonia Left lower lobe pneumonia with new onset hypoxia and near syncopal event earlier in the day IV Rocephin azithromycin for infectious coverage COVID flu and RSV negative Will check infectious studies including urine strep Legionella, sputum cultures, expanded respiratory panel Supplemental oxygen as needed Follow   Acute respiratory failure with hypoxia ([place]) New onset O2 requirement in setting of pneumonia and recent near syncopal event Chest x-ray with lobar pneumonia Currently stable on 2 L keeping O2 sats greater than 94% IV Rocephin azithromycin for pneumonia coverage Monitor  Near syncope Witnessed near-syncopal event at church in the setting of pneumonia Noted hypoxia on evaluation by EMS the has persisted upon admission-left lung pneumonia on imaging. Grossly normal neuroexam Suspect symptoms secondary to pulmonary infection CT head pending Will check orthostatics Treat pneumonia Gentle IV fluids overnight Monitor  AKI (acute kidney injury) (HCC) Creatinine 1.25 on presentation with GFR in the 40s Clinically dry Will gently hydrate patient Hold offending agents Check FEUN in setting of home lasix use  Renal imaging as clinically indicated Follow  HLD (hyperlipidemia) Continue statin  Essential hypertension BP stable Titrate home regimen the setting of syncope   Diabetes mellitus without complication ([place]) SSI [id number]      Advance Care Planning:   Code Status: Full Code   Consults: None   Family Communication: No family at the bedside   Severity of Illness: The appropriate patient status for this  patient is OBSERVATION. Observation status is judged to be reasonable and necessary in order to provide the required intensity of service to ensure the patient's safety. The patient's presenting symptoms, physical exam findings, and initial radiographic and laboratory data in the context of their medical condition is felt to place them at decreased risk for further clinical deterioration. Furthermore, it is anticipated that the patient will be medically stable for discharge from the hospital within 2 midnights of admission.   Author: Deneise Lever, MD 05/27/2022 4:17 PM  For on call review [email or web address].

## 2022-05-27 NOTE — Assessment & Plan Note (Signed)
Left lower lobe pneumonia with new onset hypoxia and syncopal event earlier in the day IV Rocephin azithromycin for infectious coverage COVID flu and RSV negative Will check infectious studies including urine strep Legionella, sputum cultures, expanded respiratory panel Supplemental oxygen as needed Follow

## 2022-05-27 NOTE — Assessment & Plan Note (Signed)
SSI A1c 

## 2022-05-27 NOTE — ED Triage Notes (Signed)
Pt to ED via [facility] from church with near syncopal episode. Pt reports feeling dizzy and that she might pass out. Pt reports these episodes happen frequently. Pt has a hx of hypertension, diabetes, syn episodes. Pt's O2 was 82% for EMS and placed on 3 L.   EMS VS: HR 71 CBG 144 102/58 82% RA

## 2022-05-27 NOTE — Assessment & Plan Note (Addendum)
Creatinine 1.25 on presentation with GFR in the 40s Clinically dry Will gently hydrate patient Hold offending agents Check FEUN in setting of home lasix use  Renal imaging as clinically indicated Follow

## 2022-05-27 NOTE — Assessment & Plan Note (Signed)
Left lower lobe pneumonia with new onset hypoxia and near syncopal event earlier in the day IV Rocephin azithromycin for infectious coverage COVID flu and RSV negative Will check infectious studies including urine strep Legionella, sputum cultures, expanded respiratory panel Supplemental oxygen as needed Follow

## 2022-05-27 NOTE — Assessment & Plan Note (Signed)
Witnessed near-syncopal event at church in the setting of pneumonia Noted hypoxia on evaluation by EMS the has persisted upon admission-left lung pneumonia on imaging. Grossly normal neuroexam Suspect symptoms secondary to pulmonary infection CT head pending Will check orthostatics Treat pneumonia Gentle IV fluids overnight Monitor

## 2022-05-27 NOTE — Assessment & Plan Note (Addendum)
Witnessed near-syncopal event at church in the setting of pneumonia Noted hypoxia on evaluation by EMS the has persisted upon admission-left lung pneumonia on imaging. Grossly normal neuroexam Suspect syncope secondary to pulmonary infection CT head pending Will check orthostatics Treat pneumonia Gentle IV fluids overnight Monitor

## 2022-05-27 NOTE — ED Provider Notes (Signed)
[facility] Provider Note    Event Date/Time   First MD Initiated Contact with Patient 05/27/22 1229     (approximate)   History   Near Syncope   HPI  Jodi Chavez is a 72 y.o. female with a history of hypertension, hyperlipidemia, diabetes who presents with lightheadedness, acute onset while she was at church today, and associated with feeling nauseous.  The patient states that she was towards the end of the church service.  She did have breakfast but was standing for a while.  She states that this has happened before, most recently 2 years ago, and she was seen in the ED for it at that time.  She states she is now feeling better.  She was found to be hypoxic by EMS but this appears to have resolved without intervention.  The patient denies any difficulty breathing.  She has no chest pain, cough, fever, or any vomiting or diarrhea, severe headache, other acute pain, or any other acute symptoms.  I reviewed the past medical records.  The patient was most recently seen in the ED in 2022 with an episode of near syncope and a negative workup at that time.  Her most recent outpatient counter was with internal medicine for cough and URI symptoms on 12/27 of last year.  She has no other recent ED visits or admissions.   Physical Exam   Triage Vital Signs: ED Triage Vitals  Enc Vitals Group     BP 05/27/22 1237 (!) 164/72     Pulse Rate 05/27/22 1237 71     Resp 05/27/22 1239 17     Temp 05/27/22 1237 97.6 F (36.4 C)     Temp Source 05/27/22 1237 Oral     SpO2 05/27/22 1237 97 %     Weight --      Height --      Head Circumference --      Peak Flow --      Pain Score 05/27/22 1240 0     Pain Loc --      Pain Edu? --      Excl. in [place]? --     Most recent vital signs: Vitals:   05/27/22 1330 05/27/22 1400  BP: (!) 151/67 136/66  Pulse: 78 78  Resp: 12 20  Temp:    SpO2: 95% 99%     General: Alert and oriented, no distress.  CV:  Good peripheral  perfusion.  Normal heart sounds. Resp:  Normal effort.  Lungs CTAB. Abd:  No distention.  Other:  Dry mucous membranes.  EOMI.  PERRLA.  No facial droop.  Motor intact in all extremities.  Normal coordination with no ataxia on finger-to-nose.  No pronator drift.   ED Results / Procedures / Treatments   Labs (all labs ordered are listed, but only abnormal results are displayed) Labs Reviewed  COMPREHENSIVE METABOLIC PANEL - Abnormal; Notable for the following components:      Result Value   Sodium 132 (*)    Chloride 91 (*)    Glucose, Bld 159 (*)    BUN 24 (*)    Creatinine, Ser 1.25 (*)    GFR, Estimated 46 (*)    All other components within normal limits  CBC WITH DIFFERENTIAL/PLATELET - Abnormal; Notable for the following components:   WBC 10.6 (*)    Neutro Abs 8.2 (*)    All other components within normal limits  RESP PANEL BY RT-PCR (RSV, FLU A&B,  COVID)  [id number]  URINALYSIS, ROUTINE W REFLEX MICROSCOPIC  PROCALCITONIN  TROPONIN I (HIGH SENSITIVITY)     EKG  ED ECG REPORT I, Arta Silence, the attending physician, personally viewed and interpreted this ECG.  Date: 05/27/2022 EKG Time: 1239 Rate: 73 Rhythm: normal sinus rhythm QRS Axis: normal Intervals: normal ST/T Wave abnormalities: normal Narrative Interpretation: no evidence of acute ischemia    RADIOLOGY  Chest x-ray: I independently viewed and interpreted the images; there are no focal consolidations.  Radiology report indicates hazy left lower lobe opacity, possible atelectasis  PROCEDURES:  Critical Care performed: No  Procedures   MEDICATIONS ORDERED IN ED: Medications  cefTRIAXone (ROCEPHIN) 1 g in sodium chloride 0.9 % 100 mL IVPB (1 g Intravenous New Bag/Given 05/27/22 1457)  azithromycin (ZITHROMAX) 500 mg in sodium chloride 0.9 % 250 mL IVPB (has no administration in time range)  aspirin EC tablet 81 mg (has no administration in time range)  benazepril (LOTENSIN) tablet 40 mg (has  no administration in time range)  cloNIDine (CATAPRES) tablet 0.2 mg (has no administration in time range)  metoprolol succinate (TOPROL-XL) 24 hr tablet 25 mg (has no administration in time range)  ezetimibe (ZETIA) tablet 10 mg (has no administration in time range)  pantoprazole (PROTONIX) EC tablet 40 mg (has no administration in time range)  simvastatin (ZOCOR) tablet 40 mg (has no administration in time range)  benzonatate (TESSALON) capsule 200 mg (has no administration in time range)  guaiFENesin-dextromethorphan (ROBITUSSIN DM) 100-10 MG/5ML syrup 5 mL (has no administration in time range)  sodium chloride 0.9 % bolus 500 mL (0 mLs Intravenous Stopped 05/27/22 1351)  ondansetron (ZOFRAN) injection 4 mg (4 mg Intravenous Given 05/27/22 1254)     IMPRESSION / MDM / ASSESSMENT AND PLAN / ED COURSE  I reviewed the triage vital signs and the nursing notes.  72 year old female with PMH as noted above presents with a near syncopal event while in church today.  She was found to be hypoxic by EMS but this has resolved.  Other vital signs are normal except for hypertension.  Physical exam is notable only for dry mucous membranes.  Differential diagnosis includes, but is not limited to, dehydration/hypovolemia, electrolyte abnormality, other metabolic disturbance, less likely cardiac etiology.  There is no evidence of CNS cause.  Neurologic exam is normal.  I have a lower suspicion for infection.  We will obtain basic labs, cardiac enzymes, chest x-ray, UA, give a fluid bolus, and reassess.  Patient's presentation is most consistent with acute presentation with potential threat to life or bodily function.  The patient is on the cardiac monitor to evaluate for evidence of arrhythmia and/or significant heart rate changes.  ----------------------------------------- 3:25 PM on 05/27/2022 -----------------------------------------  Chest x-ray shows possible left lower lobe pneumonia.  Lab workup  reveals mild leukocytosis.  Creatinine is slightly abnormal but consistent with the patient's baseline.  Initial troponin is negative.  The patient has had a few recurrent episodes of hypoxia to the mid to high 80s.  We have placed her back on oxygen.  She is not having any significant cough or shortness of breath but overall clinical presentation favors pneumonia at this time.  I have ordered IV antibiotics for empiric treatment as well as a respiratory panel and a procalcitonin level.  I consulted Dr. Ernestina Patches from the hospitalist service; based our discussion he agrees to admit the patient.   FINAL CLINICAL IMPRESSION(S) / ED DIAGNOSES   Final diagnoses:  Near syncope  Community acquired  pneumonia of left lower lobe of lung     Rx / DC Orders   ED Discharge Orders     None        Note:  This document was prepared using Dragon voice recognition software and may include unintentional dictation errors.    Arta Silence, MD 05/27/22 1526

## 2022-05-27 NOTE — Plan of Care (Signed)

## 2022-05-27 NOTE — ED Notes (Signed)
Advised nurse that patient has ready bed 

## 2022-05-27 NOTE — Assessment & Plan Note (Signed)
New onset O2 requirement in setting of pneumonia and recent syncopal event Chest x-ray with lobar pneumonia Currently stable on 2 L keeping O2 sats greater than 94% IV Rocephin azithromycin for pneumonia coverage Monitor

## 2022-05-27 NOTE — Assessment & Plan Note (Addendum)
New onset O2 requirement in setting of pneumonia and recent near syncopal event Chest x-ray with lobar pneumonia Currently stable on 2 L keeping O2 sats greater than 94% IV Rocephin azithromycin for pneumonia coverage Monitor

## 2022-05-27 NOTE — Progress Notes (Signed)
PHARMACIST - PHYSICIAN COMMUNICATION  CONCERNING:  Enoxaparin (Lovenox) for DVT Prophylaxis    RECOMMENDATION: Patient was prescribed enoxaprin 40mg  q24 hours for VTE prophylaxis.   Filed Weights   05/27/22 2000 05/27/22 2100  Weight: 95.3 kg (210 lb) 95.3 kg (210 lb)    Body mass index is 39.68 kg/m.  Estimated Creatinine Clearance: 44.2 mL/min (A) (by C-G formula based on SCr of 1.25 mg/dL (H)).   Based on [place] patient is candidate for enoxaparin 0.5mg /kg TBW SQ every 24 hours based on BMI being >30.  DESCRIPTION: Pharmacy has adjusted enoxaparin dose per [facility] policy.  Patient is now receiving enoxaparin 47.5 mg every 24 hours    Lorin Picket, PharmD Clinical Pharmacist  05/27/2022 9:02 PM

## 2022-05-27 NOTE — Assessment & Plan Note (Signed)
Continue statin. 

## 2022-05-27 NOTE — Assessment & Plan Note (Signed)
BP stable Titrate home regimen the setting of syncope

## 2022-05-28 ENCOUNTER — Encounter: Payer: Self-pay | Admitting: Family Medicine

## 2022-05-28 DIAGNOSIS — E1122 Type 2 diabetes mellitus with diabetic chronic kidney disease: Secondary | ICD-10-CM | POA: Diagnosis present

## 2022-05-28 DIAGNOSIS — E785 Hyperlipidemia, unspecified: Secondary | ICD-10-CM | POA: Diagnosis present

## 2022-05-28 DIAGNOSIS — F32A Depression, unspecified: Secondary | ICD-10-CM | POA: Diagnosis present

## 2022-05-28 DIAGNOSIS — Z6839 Body mass index (BMI) 39.0-39.9, adult: Secondary | ICD-10-CM | POA: Diagnosis not present

## 2022-05-28 DIAGNOSIS — R0602 Shortness of breath: Secondary | ICD-10-CM | POA: Diagnosis present

## 2022-05-28 DIAGNOSIS — Z7984 Long term (current) use of oral hypoglycemic drugs: Secondary | ICD-10-CM | POA: Diagnosis not present

## 2022-05-28 DIAGNOSIS — J9601 Acute respiratory failure with hypoxia: Secondary | ICD-10-CM | POA: Diagnosis present

## 2022-05-28 DIAGNOSIS — E669 Obesity, unspecified: Secondary | ICD-10-CM | POA: Diagnosis present

## 2022-05-28 DIAGNOSIS — J189 Pneumonia, unspecified organism: Secondary | ICD-10-CM | POA: Diagnosis not present

## 2022-05-28 DIAGNOSIS — Z800 Family history of malignant neoplasm of digestive organs: Secondary | ICD-10-CM | POA: Diagnosis not present

## 2022-05-28 DIAGNOSIS — Z1152 Encounter for screening for COVID-19: Secondary | ICD-10-CM | POA: Diagnosis not present

## 2022-05-28 DIAGNOSIS — I129 Hypertensive chronic kidney disease with stage 1 through stage 4 chronic kidney disease, or unspecified chronic kidney disease: Secondary | ICD-10-CM | POA: Diagnosis present

## 2022-05-28 DIAGNOSIS — N179 Acute kidney failure, unspecified: Secondary | ICD-10-CM | POA: Diagnosis present

## 2022-05-28 DIAGNOSIS — N1831 Chronic kidney disease, stage 3a: Secondary | ICD-10-CM | POA: Diagnosis present

## 2022-05-28 DIAGNOSIS — Z833 Family history of diabetes mellitus: Secondary | ICD-10-CM | POA: Diagnosis not present

## 2022-05-28 DIAGNOSIS — G629 Polyneuropathy, unspecified: Secondary | ICD-10-CM | POA: Diagnosis present

## 2022-05-28 DIAGNOSIS — Z8249 Family history of ischemic heart disease and other diseases of the circulatory system: Secondary | ICD-10-CM | POA: Diagnosis not present

## 2022-05-28 DIAGNOSIS — J181 Lobar pneumonia, unspecified organism: Secondary | ICD-10-CM | POA: Diagnosis present

## 2022-05-28 DIAGNOSIS — Z823 Family history of stroke: Secondary | ICD-10-CM | POA: Diagnosis not present

## 2022-05-28 LAB — RESPIRATORY PANEL BY PCR

## 2022-05-28 LAB — CBC
HCT: 33.4 % — ABNORMAL LOW (ref 36.0–46.0)
Hemoglobin: 11.7 g/dL — ABNORMAL LOW (ref 12.0–15.0)
MCH: 33.5 pg (ref 26.0–34.0)
MCHC: 35 g/dL (ref 30.0–36.0)
MCV: 95.7 fL (ref 80.0–100.0)
Platelets: 204 10*3/uL (ref 150–400)
RBC: 3.49 MIL/uL — ABNORMAL LOW (ref 3.87–5.11)
RDW: 13 % (ref 11.5–15.5)
WBC: 8.7 10*3/uL (ref 4.0–10.5)
nRBC: 0 % (ref 0.0–0.2)

## 2022-05-28 LAB — COMPREHENSIVE METABOLIC PANEL
ALT: 17 U/L (ref 0–44)
AST: 21 U/L (ref 15–41)
Albumin: 3.5 g/dL (ref 3.5–5.0)
Alkaline Phosphatase: 58 U/L (ref 38–126)
Anion gap: 8 (ref 5–15)
BUN: 23 mg/dL (ref 8–23)
CO2: 30 mmol/L (ref 22–32)
Calcium: 8.8 mg/dL — ABNORMAL LOW (ref 8.9–10.3)
Chloride: 97 mmol/L — ABNORMAL LOW (ref 98–111)
Creatinine, Ser: 1.12 mg/dL — ABNORMAL HIGH (ref 0.44–1.00)
GFR, Estimated: 53 mL/min — ABNORMAL LOW (ref >60)
Glucose, Bld: 115 mg/dL — ABNORMAL HIGH (ref 70–99)
Potassium: 4.2 mmol/L (ref 3.5–5.1)
Sodium: 135 mmol/L (ref 135–145)
Total Bilirubin: 0.4 mg/dL (ref 0.3–1.2)
Total Protein: 6.2 g/dL — ABNORMAL LOW (ref 6.5–8.1)

## 2022-05-28 LAB — HIV ANTIBODY (ROUTINE TESTING W REFLEX): HIV Screen 4th Generation wRfx: NONREACTIVE

## 2022-05-28 LAB — GLUCOSE, CAPILLARY
Glucose-Capillary: 105 mg/dL — ABNORMAL HIGH (ref 70–99)
Glucose-Capillary: 106 mg/dL — ABNORMAL HIGH (ref 70–99)
Glucose-Capillary: 137 mg/dL — ABNORMAL HIGH (ref 70–99)
Glucose-Capillary: 122 mg/dL — ABNORMAL HIGH (ref 70–99)

## 2022-05-28 LAB — SODIUM, URINE, RANDOM: Sodium, Ur: 58 mmol/L

## 2022-05-28 MED ORDER — PROMETHAZINE HCL 25 MG PO TABS
12.5000 mg | ORAL_TABLET | Freq: Three times a day (TID) | ORAL | Status: DC | PRN
  Administered 2022-05-28 – 2022-05-29 (×2): 12.5 mg via ORAL
  Filled 2022-05-28 (×4): qty 1

## 2022-05-28 NOTE — Plan of Care (Signed)
  Problem: Activity: Goal: Ability to tolerate increased activity will improve Outcome: Progressing   Problem: Clinical Measurements: Goal: Ability to maintain a body temperature in the normal range will improve Outcome: Progressing   Problem: Respiratory: Goal: Ability to maintain adequate ventilation will improve Outcome: Progressing   

## 2022-05-28 NOTE — Progress Notes (Signed)
Progress Note    ZISSEL Chavez  [id number] DOB: 1950/04/19  DOA: 05/27/2022 PCP: Nolene Ebbs, MD      Brief Narrative:    Medical records reviewed and are as summarized below:  Jodi Chavez is a 72 y.o. female with medical history significant for constipation, type II DM, hypertension, hyperlipidemia, peripheral edema, who presented to the hospital with near syncope and decreased oxygen saturation. Reported that [place], her oxygen saturation was 82% on room air.      Assessment/Plan:   Principal Problem:   Pneumonia Active Problems:   Diabetes mellitus without complication ([place])   Essential hypertension   HLD (hyperlipidemia)   CKD (chronic kidney disease), stage III   Near syncope   Acute respiratory failure with hypoxia    Body mass index is 39.68 kg/m.  (Obesity)   Community-acquired left lower lobe pneumonia: Continue empiric IV ceftriaxone and azithromycin.  Respiratory panel by PCR, COVID-19, influenza and RSV tests were negative.   Acute hypoxic respiratory failure: She was on 2 L/min oxygen this morning.  Taper off oxygen as able   S/p near syncope: This is likely from hypoxia.   CKD stage IIIa: Creatinine is stable   Diet Order             Diet heart healthy/carb modified Room service appropriate? Yes; Fluid consistency: Thin  Diet effective now                            Consultants: None  Procedures: None    Medications:    aspirin EC  81 mg Oral Daily   benzonatate  200 mg Oral TID   cloNIDine  0.1 mg Oral Q2200   enoxaparin (LOVENOX) injection  0.5 mg/kg Subcutaneous Q24H   ezetimibe  10 mg Oral Daily   insulin aspart  0-5 Units Subcutaneous QHS   insulin aspart  0-9 Units Subcutaneous TID WC   insulin aspart  3 Units Subcutaneous TID WC   loratadine  10 mg Oral Daily   metoprolol succinate  25 mg Oral Daily   pantoprazole  40 mg Oral Daily   simvastatin  40 mg  Oral q1800   Continuous Infusions:  azithromycin 500 mg (05/28/22 1032)   cefTRIAXone (ROCEPHIN)  IV 2 g (05/28/22 0832)     Anti-infectives (From admission, onward)    Start     Dose/Rate Route Frequency Ordered Stop   05/28/22 1000  azithromycin (ZITHROMAX) 500 mg in sodium chloride 0.9 % 250 mL IVPB        500 mg 250 mL/hr over 60 Minutes Intravenous Every 24 hours 05/27/22 1559 06/02/22 0959   05/28/22 0900  cefTRIAXone (ROCEPHIN) 2 g in sodium chloride 0.9 % 100 mL IVPB        2 g 200 mL/hr over 30 Minutes Intravenous Every 24 hours 05/27/22 1559 06/02/22 0859   05/27/22 1500  cefTRIAXone (ROCEPHIN) 1 g in sodium chloride 0.9 % 100 mL IVPB        1 g 200 mL/hr over 30 Minutes Intravenous  Once 05/27/22 1454 05/27/22 1543   05/27/22 1500  azithromycin (ZITHROMAX) 500 mg in sodium chloride 0.9 % 250 mL IVPB        500 mg 250 mL/hr over 60 Minutes Intravenous  Once 05/27/22 1454 05/27/22 1649              Family Communication/Anticipated D/C  date and plan/Code Status   DVT prophylaxis: SCDs Start: 05/27/22 1557     Code Status: Full Code  Family Communication: None Disposition Plan: Plan to discharge home tomorrow   Status is: Inpatient Remains inpatient appropriate because: Pneumonia on IV antibiotics       Subjective:   Interval events noted.  She complains of cough and shortness of breath.  [place], LPN, was at the bedside  Objective:    Vitals:   05/28/22 0246 05/28/22 0247 05/28/22 0804 05/28/22 1633  BP: (!) 140/63 (!) 142/121 (!) 130/57 (!) 141/63  Pulse: 83 85 71 79  Resp:  19 16 16   Temp:   98.2 F (36.8 C) (!) 97.5 F (36.4 C)  TempSrc:      SpO2: 95% 99% 96% 93%  Weight:      Height:       No data found.   Intake/Output Summary (Last 24 hours) at 05/28/2022 1640 Last data filed at 05/28/2022 1500 Gross per 24 hour  Intake 350 ml  Output 0 ml  Net 350 ml   Filed Weights   05/27/22 2000 05/27/22 2100  Weight: 95.3 kg 95.3 kg     Exam:  GEN: NAD SKIN: Warm and dry EYES: EOMI ENT: MMM CV: RRR PULM: Rales at the left lung base.  No wheezing ABD: soft, obese, NT, +BS CNS: AAO x 3, non focal EXT: No edema or tenderness        Data Reviewed:   I have personally reviewed following labs and imaging studies:  Labs: Labs show the following:   Basic Metabolic Panel: Recent Labs  Lab 05/27/22 1242 05/28/22 0523  NA 132* 135  K 3.9 4.2  CL 91* 97*  CO2 29 30  GLUCOSE 159* 115*  BUN 24* 23  CREATININE 1.25* 1.12*  CALCIUM 9.5 8.8*   GFR Estimated Creatinine Clearance: 49.3 mL/min (A) (by C-G formula based on SCr of 1.12 mg/dL (H)). Liver Function Tests: Recent Labs  Lab 05/27/22 1242 05/28/22 0523  AST 31 21  ALT 20 17  ALKPHOS 72 58  BILITOT 0.6 0.4  PROT 7.0 6.2*  ALBUMIN 4.2 3.5   No results for input(s): "LIPASE", "AMYLASE" in the last 168 hours. No results for input(s): "AMMONIA" in the last 168 hours. Coagulation profile No results for input(s): "INR", "PROTIME" in the last 168 hours.  CBC: Recent Labs  Lab 05/27/22 1242 05/28/22 0523  WBC 10.6* 8.7  NEUTROABS 8.2*  --   HGB 12.8 11.7*  HCT 37.1 33.4*  MCV 95.6 95.7  PLT 241 204   Cardiac Enzymes: No results for input(s): "CKTOTAL", "CKMB", "CKMBINDEX", "TROPONINI" in the last 168 hours. BNP (last 3 results) No results for input(s): "PROBNP" in the last 8760 hours. CBG: Recent Labs  Lab 05/27/22 2148 05/28/22 0758 05/28/22 1155  GLUCAP 157* 105* 106*   D-Dimer: No results for input(s): "DDIMER" in the last 72 hours. Hgb A1c: No results for input(s): "HGBA1C" in the last 72 hours. Lipid Profile: No results for input(s): "CHOL", "HDL", "LDLCALC", "TRIG", "CHOLHDL", "LDLDIRECT" in the last 72 hours. Thyroid function studies: No results for input(s): "TSH", "T4TOTAL", "T3FREE", "THYROIDAB" in the last 72 hours.  Invalid input(s): "FREET3" Anemia work up: No results for input(s): "VITAMINB12", "FOLATE",  "FERRITIN", "TIBC", "IRON", "RETICCTPCT" in the last 72 hours. Sepsis Labs: Recent Labs  Lab 05/27/22 1242 05/28/22 0523  PROCALCITON <0.10  --   WBC 10.6* 8.7    Microbiology Recent Results (from the past 240 hour(s))  Resp panel by RT-PCR (RSV, Flu A&B, Covid) Anterior Nasal Swab     Status: None   Collection Time: 05/27/22  3:00 PM   Specimen: Anterior Nasal Swab  Result Value Ref Range Status   SARS Coronavirus 2 by RT PCR NEGATIVE NEGATIVE Final    Comment: (NOTE) SARS-CoV-2 target nucleic acids are NOT DETECTED.  The SARS-CoV-2 RNA is generally detectable in upper respiratory specimens during the acute phase of infection. The lowest concentration of SARS-CoV-2 viral copies this assay can detect is 138 copies/mL. A negative result does not preclude SARS-Cov-2 infection and should not be used as the sole basis for treatment or other patient management decisions. A negative result may occur with  improper specimen collection/handling, submission of specimen other than nasopharyngeal swab, presence of viral mutation(s) within the areas targeted by this assay, and inadequate number of viral copies(<138 copies/mL). A negative result must be combined with clinical observations, patient history, and epidemiological information. The expected result is Negative.  Fact Sheet for Patients:  EntrepreneurPulse.com.au  Fact Sheet for Healthcare Providers:  IncredibleEmployment.be  This test is no t yet approved or cleared by the [place] FDA and  has been authorized for detection and/or diagnosis of SARS-CoV-2 by FDA under an Emergency Use Authorization (EUA). This EUA will remain  in effect (meaning this test can be used) for the duration of the COVID-19 declaration under Section 564(b)(1) of the Act, 21 U.S.C.section 360bbb-3(b)(1), unless the authorization is terminated  or revoked sooner.       Influenza A by PCR NEGATIVE NEGATIVE  Final   Influenza B by PCR NEGATIVE NEGATIVE Final    Comment: (NOTE) The Xpert Xpress SARS-CoV-2/FLU/RSV plus assay is intended as an aid in the diagnosis of influenza from Nasopharyngeal swab specimens and should not be used as a sole basis for treatment. Nasal washings and aspirates are unacceptable for Xpert Xpress SARS-CoV-2/FLU/RSV testing.  Fact Sheet for Patients: EntrepreneurPulse.com.au  Fact Sheet for Healthcare Providers: IncredibleEmployment.be  This test is not yet approved or cleared by the [place] FDA and has been authorized for detection and/or diagnosis of SARS-CoV-2 by FDA under an Emergency Use Authorization (EUA). This EUA will remain in effect (meaning this test can be used) for the duration of the COVID-19 declaration under Section 564(b)(1) of the Act, 21 U.S.C. section 360bbb-3(b)(1), unless the authorization is terminated or revoked.     Resp Syncytial Virus by PCR NEGATIVE NEGATIVE Final    Comment: (NOTE) Fact Sheet for Patients: EntrepreneurPulse.com.au  Fact Sheet for Healthcare Providers: IncredibleEmployment.be  This test is not yet approved or cleared by the [place] FDA and has been authorized for detection and/or diagnosis of SARS-CoV-2 by FDA under an Emergency Use Authorization (EUA). This EUA will remain in effect (meaning this test can be used) for the duration of the COVID-19 declaration under Section 564(b)(1) of the Act, 21 U.S.C. section 360bbb-3(b)(1), unless the authorization is terminated or revoked.  Performed at [facility], [place],  [address]   Respiratory (~20 pathogens) panel by PCR     Status: None   Collection Time: 05/28/22  4:50 AM   Specimen: Nasopharyngeal Swab; Respiratory  Result Value Ref Range Status   Adenovirus NOT DETECTED NOT DETECTED Final   Coronavirus 229E NOT DETECTED NOT DETECTED Final     Comment: (NOTE) The Coronavirus on the Respiratory Panel, DOES NOT test for the novel  Coronavirus (2019 nCoV)    Coronavirus HKU1 NOT DETECTED NOT DETECTED Final  Coronavirus NL63 NOT DETECTED NOT DETECTED Final   Coronavirus OC43 NOT DETECTED NOT DETECTED Final   Metapneumovirus NOT DETECTED NOT DETECTED Final   Rhinovirus / Enterovirus NOT DETECTED NOT DETECTED Final   Influenza A NOT DETECTED NOT DETECTED Final   Influenza B NOT DETECTED NOT DETECTED Final   Parainfluenza Virus 1 NOT DETECTED NOT DETECTED Final   Parainfluenza Virus 2 NOT DETECTED NOT DETECTED Final   Parainfluenza Virus 3 NOT DETECTED NOT DETECTED Final   Parainfluenza Virus 4 NOT DETECTED NOT DETECTED Final   Respiratory Syncytial Virus NOT DETECTED NOT DETECTED Final   Bordetella pertussis NOT DETECTED NOT DETECTED Final   Bordetella Parapertussis NOT DETECTED NOT DETECTED Final   Chlamydophila pneumoniae NOT DETECTED NOT DETECTED Final   Mycoplasma pneumoniae NOT DETECTED NOT DETECTED Final    Comment: Performed at [facility], [place] [address], [place],  [address]    Procedures and diagnostic studies:  CT HEAD WO CONTRAST (5MM)  Result Date: 05/27/2022 CLINICAL DATA:  Near syncopal episode.  Dizziness. EXAM: CT HEAD WITHOUT CONTRAST TECHNIQUE: Contiguous axial images were obtained from the base of the skull through the vertex without intravenous contrast. RADIATION DOSE REDUCTION: This exam was performed according to the departmental dose-optimization program which includes automated exposure control, adjustment of the mA and/or kV according to patient size and/or use of iterative reconstruction technique. COMPARISON:  09/04/2017 FINDINGS: Brain: No evidence of acute infarction, hemorrhage, hydrocephalus, extra-axial collection or mass lesion/mass effect. Vascular: No hyperdense vessel or unexpected calcification. Skull: Normal. Negative for fracture or focal lesion. Sinuses/Orbits: No acute  finding. Other: None. IMPRESSION: Normal head CT. Electronically Signed   By: Kerby Moors M.D.   On: 05/27/2022 17:30   DG Chest 2 View  Result Date: 05/27/2022 CLINICAL DATA:  Hypoxia EXAM: CHEST - 2 VIEW COMPARISON:  CXR 10/07/19 FINDINGS: No pleural effusion. No pneumothorax. Low lung volumes. There is hazy opacity at the left lung base which could be seen with atelectasis or infection. No radiographically apparent displaced rib fractures. Visualized upper abdomen is unremarkable. Vertebral body heights are maintained. IMPRESSION: Low lung volumes with hazy opacity at the left lung base which could be seen with atelectasis or infection. Electronically Signed   By: Marin Roberts M.D.   On: 05/27/2022 13:32               LOS: 0 days   Reyes Fifield  [facility]   Pager on [email or web address]. If 7PM-7AM, please contact night-coverage at [email or web address]     05/28/2022, 4:40 PM

## 2022-05-29 DIAGNOSIS — N1831 Chronic kidney disease, stage 3a: Secondary | ICD-10-CM

## 2022-05-29 DIAGNOSIS — J189 Pneumonia, unspecified organism: Secondary | ICD-10-CM | POA: Diagnosis not present

## 2022-05-29 DIAGNOSIS — J9601 Acute respiratory failure with hypoxia: Secondary | ICD-10-CM | POA: Diagnosis not present

## 2022-05-29 LAB — HEMOGLOBIN A1C
Hgb A1c MFr Bld: 6.6 % — ABNORMAL HIGH (ref 4.8–5.6)
Mean Plasma Glucose: 143 mg/dL

## 2022-05-29 LAB — GLUCOSE, CAPILLARY
Glucose-Capillary: 104 mg/dL — ABNORMAL HIGH (ref 70–99)
Glucose-Capillary: 105 mg/dL — ABNORMAL HIGH (ref 70–99)
Glucose-Capillary: 177 mg/dL — ABNORMAL HIGH (ref 70–99)

## 2022-05-29 LAB — UREA NITROGEN, URINE: Urea Nitrogen, Ur: 313 mg/dL

## 2022-05-29 MED ORDER — AZITHROMYCIN 500 MG PO TABS
500.0000 mg | ORAL_TABLET | Freq: Every day | ORAL | Status: DC
  Administered 2022-05-29: 500 mg via ORAL
  Filled 2022-05-29: qty 1

## 2022-05-29 MED ORDER — AMOXICILLIN-POT CLAVULANATE 875-125 MG PO TABS: 1.0000 | ORAL_TABLET | Freq: Two times a day (BID) | ORAL | 0 refills | Status: AC

## 2022-05-29 MED ORDER — ACETAMINOPHEN 325 MG PO TABS
650.0000 mg | ORAL_TABLET | Freq: Four times a day (QID) | ORAL | Status: DC | PRN
  Administered 2022-05-29: 650 mg via ORAL
  Filled 2022-05-29: qty 2

## 2022-05-29 MED ORDER — HYDROCHLOROTHIAZIDE 12.5 MG PO TABS
12.5000 mg | ORAL_TABLET | Freq: Every day | ORAL | Status: DC
  Administered 2022-05-29: 12.5 mg via ORAL
  Filled 2022-05-29: qty 1

## 2022-05-29 NOTE — Discharge Summary (Signed)
Physician Discharge Summary   Patient: Jodi Chavez MRN: [id number] DOB: 13-Feb-1951  Admit date:     05/27/2022  Discharge date: 05/29/22  Discharge Physician: Jennye Boroughs   PCP: Nolene Ebbs, MD   Recommendations at discharge:   Follow-up with PCP in 1 week  Discharge Diagnoses: Principal Problem:   Pneumonia Active Problems:   Diabetes mellitus without complication (Agency)   Essential hypertension   HLD (hyperlipidemia)   CKD (chronic kidney disease), stage III   Near syncope   Acute respiratory failure with hypoxia  Resolved Problems:   * No resolved hospital problems. [facility] Course:  Ms. FRANZISKA Chavez is a 72 y.o. female with medical history significant for constipation, type II DM, hypertension, hyperlipidemia, peripheral edema, who presented to the hospital with near syncope and decreased oxygen saturation. Reportedly, when EMS picked her [place], her oxygen saturation was 82% on room air.   She was found to have community-acquired left lower lobe pneumonia complicated by acute hypoxic respiratory failure.  She was treated with empiric IV antibiotics.  She was treated with IV fluids and required up to 2 L/min oxygen via nasal cannula.  Her condition has improved and she is deemed stable for discharge home today.  Assessment and Plan:   Community-acquired left lower lobe pneumonia: Improved.  She will be discharged on Augmentin.  Respiratory panel by PCR, COVID-19, influenza and RSV tests were negative.     Acute hypoxic respiratory failure: Resolved.  She is tolerating room air.     S/p near syncope: This is likely from hypoxia.     CKD stage IIIa: Creatinine is stable   Other comorbidities include hypertension, hyperlipidemia, type 2 diabetes mellitus        Consultants: None Procedures performed: None  Disposition: Home Diet recommendation:  Discharge Diet Orders (From admission, onward)     Start     Ordered    05/29/22 0000  Diet - low sodium heart healthy        05/29/22 1311   05/29/22 0000  Diet Carb Modified        05/29/22 1311           Cardiac and Carb modified diet DISCHARGE MEDICATION: Allergies as of 05/29/2022       Reactions   Naproxen Sodium Rash   anaprox        Medication List     STOP taking these medications    Sennosides 25 MG Tabs       TAKE these medications    amoxicillin-clavulanate 875-125 MG tablet Commonly known as: AUGMENTIN Take 1 tablet by mouth 2 (two) times daily for 2 days. Start taking on: May 30, 2022   ascorbic acid 500 MG tablet Commonly known as: VITAMIN C Take 500 mg by mouth daily.   aspirin EC 81 MG tablet Take 81 mg by mouth daily.   benazepril 40 MG tablet Commonly known as: LOTENSIN Take 40 mg by mouth daily.   calcium-vitamin D 500-200 MG-UNIT Tabs tablet Commonly known as: OSCAL WITH D Take 1 tablet by mouth daily.   cloNIDine 0.1 MG tablet Commonly known as: CATAPRES Take 0.1 mg by mouth at bedtime.   diphenhydrAMINE 25 MG tablet Commonly known as: BENADRYL Take 1 tablet (25 mg total) by mouth every 6 (six) hours as needed for itching.   ezetimibe 10 MG tablet Commonly known as: ZETIA Take 10 mg by mouth at bedtime.   fluticasone 50 MCG/ACT nasal spray  Commonly known as: FLONASE Place 1 spray into both nostrils daily.   furosemide 20 MG tablet Commonly known as: LASIX Take 20 mg by mouth daily as needed for fluid or edema.   gabapentin 300 MG capsule Commonly known as: NEURONTIN Take 300 mg by mouth 3 (three) times daily.   Garlic 123XX123 MG Caps Take 5,000 mg by mouth daily after breakfast.   Glucosamine-Chondroitin 500-400 MG Caps Take 1 tablet by mouth daily.   hydrochlorothiazide 12.5 MG capsule Commonly known as: MICROZIDE Take 12.5 mg by mouth daily.   magnesium oxide 400 (241.3 Mg) MG tablet Commonly known as: MAG-OX Take 0.5 tablets (200 mg total) by mouth 2 (two) times daily.    metFORMIN 850 MG tablet Commonly known as: GLUCOPHAGE Take 850 mg by mouth daily with breakfast.   metoprolol succinate 50 MG 24 hr tablet Commonly known as: TOPROL-XL Take 50 mg by mouth daily.   multivitamin with minerals Tabs tablet Take 1 tablet by mouth daily after breakfast.   OVER THE COUNTER MEDICATION Apply 1 application topically daily. sween cream applied to groin area daily to prevent rash   pantoprazole 40 MG tablet Commonly known as: PROTONIX Take 1 tablet (40 mg total) by mouth daily.   polyethylene glycol powder 17 GM/SCOOP powder Commonly known as: GLYCOLAX/MIRALAX Take by mouth.   promethazine 25 MG tablet Commonly known as: PHENERGAN Take 1 tablet (25 mg total) by mouth every 6 (six) hours as needed for nausea.   simvastatin 40 MG tablet Commonly known as: ZOCOR Take 40 mg by mouth every evening.   zinc sulfate 220 (50 Zn) MG capsule Take 1 capsule by mouth daily.        Discharge Exam: Filed Weights   05/27/22 2000 05/27/22 2100  Weight: 95.3 kg 95.3 kg   GEN: NAD SKIN: Warm and dry EYES: EOMI ENT: MMM CV: RRR PULM: CTA B ABD: soft, obese, NT, +BS CNS: AAO x 3, non focal EXT: No edema or tenderness   Condition at discharge: good  The results of significant diagnostics from this hospitalization (including imaging, microbiology, ancillary and laboratory) are listed below for reference.   Imaging Studies: CT HEAD WO CONTRAST (5MM)  Result Date: 05/27/2022 CLINICAL DATA:  Near syncopal episode.  Dizziness. EXAM: CT HEAD WITHOUT CONTRAST TECHNIQUE: Contiguous axial images were obtained from the base of the skull through the vertex without intravenous contrast. RADIATION DOSE REDUCTION: This exam was performed according to the departmental dose-optimization program which includes automated exposure control, adjustment of the mA and/or kV according to patient size and/or use of iterative reconstruction technique. COMPARISON:  09/04/2017  FINDINGS: Brain: No evidence of acute infarction, hemorrhage, hydrocephalus, extra-axial collection or mass lesion/mass effect. Vascular: No hyperdense vessel or unexpected calcification. Skull: Normal. Negative for fracture or focal lesion. Sinuses/Orbits: No acute finding. Other: None. IMPRESSION: Normal head CT. Electronically Signed   By: Kerby Moors M.D.   On: 05/27/2022 17:30   DG Chest 2 View  Result Date: 05/27/2022 CLINICAL DATA:  Hypoxia EXAM: CHEST - 2 VIEW COMPARISON:  CXR 10/07/19 FINDINGS: No pleural effusion. No pneumothorax. Low lung volumes. There is hazy opacity at the left lung base which could be seen with atelectasis or infection. No radiographically apparent displaced rib fractures. Visualized upper abdomen is unremarkable. Vertebral body heights are maintained. IMPRESSION: Low lung volumes with hazy opacity at the left lung base which could be seen with atelectasis or infection. Electronically Signed   By: Marin Roberts M.D.   On:  05/27/2022 13:32    Microbiology: Results for orders placed or performed during the hospital encounter of 05/27/22  Resp panel by RT-PCR (RSV, Flu A&B, Covid) Anterior Nasal Swab     Status: None   Collection Time: 05/27/22  3:00 PM   Specimen: Anterior Nasal Swab  Result Value Ref Range Status   SARS Coronavirus 2 by RT PCR NEGATIVE NEGATIVE Final    Comment: (NOTE) SARS-CoV-2 target nucleic acids are NOT DETECTED.  The SARS-CoV-2 RNA is generally detectable in upper respiratory specimens during the acute phase of infection. The lowest concentration of SARS-CoV-2 viral copies this assay can detect is 138 copies/mL. A negative result does not preclude SARS-Cov-2 infection and should not be used as the sole basis for treatment or other patient management decisions. A negative result may occur with  improper specimen collection/handling, submission of specimen other than nasopharyngeal swab, presence of viral mutation(s) within the areas  targeted by this assay, and inadequate number of viral copies(<138 copies/mL). A negative result must be combined with clinical observations, patient history, and epidemiological information. The expected result is Negative.  Fact Sheet for Patients:  EntrepreneurPulse.com.au  Fact Sheet for Healthcare Providers:  IncredibleEmployment.be  This test is no t yet approved or cleared by the [place] FDA and  has been authorized for detection and/or diagnosis of SARS-CoV-2 by FDA under an Emergency Use Authorization (EUA). This EUA will remain  in effect (meaning this test can be used) for the duration of the COVID-19 declaration under Section 564(b)(1) of the Act, 21 U.S.C.section 360bbb-3(b)(1), unless the authorization is terminated  or revoked sooner.       Influenza A by PCR NEGATIVE NEGATIVE Final   Influenza B by PCR NEGATIVE NEGATIVE Final    Comment: (NOTE) The Xpert Xpress SARS-CoV-2/FLU/RSV plus assay is intended as an aid in the diagnosis of influenza from Nasopharyngeal swab specimens and should not be used as a sole basis for treatment. Nasal washings and aspirates are unacceptable for Xpert Xpress SARS-CoV-2/FLU/RSV testing.  Fact Sheet for Patients: EntrepreneurPulse.com.au  Fact Sheet for Healthcare Providers: IncredibleEmployment.be  This test is not yet approved or cleared by the [place] FDA and has been authorized for detection and/or diagnosis of SARS-CoV-2 by FDA under an Emergency Use Authorization (EUA). This EUA will remain in effect (meaning this test can be used) for the duration of the COVID-19 declaration under Section 564(b)(1) of the Act, 21 U.S.C. section 360bbb-3(b)(1), unless the authorization is terminated or revoked.     Resp Syncytial Virus by PCR NEGATIVE NEGATIVE Final    Comment: (NOTE) Fact Sheet for  Patients: EntrepreneurPulse.com.au  Fact Sheet for Healthcare Providers: IncredibleEmployment.be  This test is not yet approved or cleared by the [place] FDA and has been authorized for detection and/or diagnosis of SARS-CoV-2 by FDA under an Emergency Use Authorization (EUA). This EUA will remain in effect (meaning this test can be used) for the duration of the COVID-19 declaration under Section 564(b)(1) of the Act, 21 U.S.C. section 360bbb-3(b)(1), unless the authorization is terminated or revoked.  Performed at [facility], [place], [place] [address]   Respiratory (~20 pathogens) panel by PCR     Status: None   Collection Time: 05/28/22  4:50 AM   Specimen: Nasopharyngeal Swab; Respiratory  Result Value Ref Range Status   Adenovirus NOT DETECTED NOT DETECTED Final   Coronavirus 229E NOT DETECTED NOT DETECTED Final    Comment: (NOTE) The Coronavirus on the Respiratory Panel, DOES  NOT test for the novel  Coronavirus (2019 nCoV)    Coronavirus HKU1 NOT DETECTED NOT DETECTED Final   Coronavirus NL63 NOT DETECTED NOT DETECTED Final   Coronavirus OC43 NOT DETECTED NOT DETECTED Final   Metapneumovirus NOT DETECTED NOT DETECTED Final   Rhinovirus / Enterovirus NOT DETECTED NOT DETECTED Final   Influenza A NOT DETECTED NOT DETECTED Final   Influenza B NOT DETECTED NOT DETECTED Final   Parainfluenza Virus 1 NOT DETECTED NOT DETECTED Final   Parainfluenza Virus 2 NOT DETECTED NOT DETECTED Final   Parainfluenza Virus 3 NOT DETECTED NOT DETECTED Final   Parainfluenza Virus 4 NOT DETECTED NOT DETECTED Final   Respiratory Syncytial Virus NOT DETECTED NOT DETECTED Final   Bordetella pertussis NOT DETECTED NOT DETECTED Final   Bordetella Parapertussis NOT DETECTED NOT DETECTED Final   Chlamydophila pneumoniae NOT DETECTED NOT DETECTED Final   Mycoplasma pneumoniae NOT DETECTED NOT DETECTED Final    Comment: Performed at  [facility], [address] [place], [place] [address]    Labs: CBC: Recent Labs  Lab 05/27/22 1242 05/28/22 0523  WBC 10.6* 8.7  NEUTROABS 8.2*  --   HGB 12.8 11.7*  HCT 37.1 33.4*  MCV 95.6 95.7  PLT 241 0000000   Basic Metabolic Panel: Recent Labs  Lab 05/27/22 1242 05/28/22 0523  NA 132* 135  K 3.9 4.2  CL 91* 97*  CO2 29 30  GLUCOSE 159* 115*  BUN 24* 23  CREATININE 1.25* 1.12*  CALCIUM 9.5 8.8*   Liver Function Tests: Recent Labs  Lab 05/27/22 1242 05/28/22 0523  AST 31 21  ALT 20 17  ALKPHOS 72 58  BILITOT 0.6 0.4  PROT 7.0 6.2*  ALBUMIN 4.2 3.5   CBG: Recent Labs  Lab 05/28/22 1155 05/28/22 1638 05/28/22 2124 05/29/22 0739 05/29/22 1140  GLUCAP 106* 137* 122* 104* 105*    Discharge time spent: greater than 30 minutes.  Signed: Jennye Boroughs, MD [facility] 05/29/2022

## 2022-05-29 NOTE — Progress Notes (Signed)
DISCHARGE NOTE:    Pt discharged with discharge instructions. IV out and antibiotics in personal belonging bag. Transportation provided via pt's sister and family member. No further questions or concerns at this time.

## 2022-05-29 NOTE — Progress Notes (Signed)
PHARMACIST - PHYSICIAN COMMUNICATION DR: Ayiku CONCERNING: Antibiotic IV to Oral Route Change Policy  RECOMMENDATION: This patient is receiving azithromycin by the intravenous route.  Based on criteria approved by the Pharmacy and Therapeutics Committee, the antibiotic(s) is/are being converted to the equivalent oral dose form(s).   DESCRIPTION: These criteria include: Patient being treated for a respiratory tract infection, urinary tract infection, cellulitis or clostridium difficile associated diarrhea if on metronidazole The patient is not neutropenic and does not exhibit a GI malabsorption state The patient is eating (either orally or via tube) and/or has been taking other orally administered medications for a least 24 hours The patient is improving clinically and has a Tmax < 100.5  If you have questions about this conversion, please contact the Pharmacy Department  []  ( [phone] )  [place] [x]  ( [phone] )  [facility] []  ( [phone]  [facility] []  ( [phone] )  [facility] []  ( [phone] )  [facility]   

## 2022-05-29 NOTE — TOC CM/SW Note (Signed)
Patient has orders to discharge home today. Chart reviewed. No TOC needs identified. CSW signing off.  Kenleigh Toback, CSW [phone]  

## 2022-05-29 NOTE — Plan of Care (Signed)
  Problem: Activity: Goal: Ability to tolerate increased activity will improve Outcome: Progressing   Problem: Clinical Measurements: Goal: Ability to maintain a body temperature in the normal range will improve Outcome: Progressing   Problem: Respiratory: Goal: Ability to maintain adequate ventilation will improve Outcome: Progressing   Problem: Education: Goal: Ability to describe self-care measures that may prevent or decrease complications (Diabetes Survival Skills Education) will improve Outcome: Progressing   Problem: Fluid Volume: Goal: Ability to maintain a balanced intake and output will improve Outcome: Progressing   Problem: Health Behavior/Discharge Planning: Goal: Ability to identify and utilize available resources and services will improve Outcome: Progressing   Problem: Metabolic: Goal: Ability to maintain appropriate glucose levels will improve Outcome: Progressing

## 2022-05-31 LAB — LEGIONELLA PNEUMOPHILA SEROGP 1 UR AG: L. pneumophila Serogp 1 Ur Ag: NEGATIVE

## 2022-06-05 ENCOUNTER — Ambulatory Visit: Payer: Medicare Other | Admitting: Podiatry

## 2022-07-09 ENCOUNTER — Ambulatory Visit (INDEPENDENT_AMBULATORY_CARE_PROVIDER_SITE_OTHER): Payer: Medicare Other | Admitting: Podiatry

## 2022-07-09 ENCOUNTER — Encounter: Payer: Self-pay | Admitting: Podiatry

## 2022-07-09 DIAGNOSIS — E1149 Type 2 diabetes mellitus with other diabetic neurological complication: Secondary | ICD-10-CM | POA: Diagnosis not present

## 2022-07-09 DIAGNOSIS — M79674 Pain in right toe(s): Secondary | ICD-10-CM

## 2022-07-09 DIAGNOSIS — E1140 Type 2 diabetes mellitus with diabetic neuropathy, unspecified: Secondary | ICD-10-CM | POA: Diagnosis not present

## 2022-07-09 DIAGNOSIS — B351 Tinea unguium: Secondary | ICD-10-CM

## 2022-07-09 DIAGNOSIS — M79675 Pain in left toe(s): Secondary | ICD-10-CM | POA: Diagnosis not present

## 2022-07-09 NOTE — Progress Notes (Addendum)
This patient returns to my office for at risk foot care.  This patient requires this care by a professional since this patient will be at risk due to having diabetes.    This patient is unable to cut nails herself since the patient cannot reach her nails.These nails are painful walking and wearing shoes.  This patient presents for at risk foot care today.  General Appearance  Alert, conversant and in no acute stress.  Vascular  Dorsalis pedis and posterior tibial  pulses are not  palpable due to swelling bilaterally.  Capillary return is within normal limits  bilaterally. Temperature is within normal limits  bilaterally.  Neurologic  Senn-Weinstein monofilament wire test within normal limits  bilaterally. Muscle power within normal limits bilaterally.  Nails Thick disfigured discolored nails with subungual debris  3-5 left and 2-5 right.. No evidence of bacterial infection or drainage bilaterally.  Orthopedic  No limitations of motion  feet .  No crepitus or effusions noted.  No bony pathology or digital deformities noted.  HAV  B/L.  Skin  normotropic skin with no porokeratosis noted bilaterally.  No signs of infections or ulcers noted.   Callus sub 1st MPJ right foot.  Onychomycosis  Pain in right toes  Pain in left toes  Callus sub 1 right foot.  Consent was obtained for treatment procedures.   Mechanical debridement of nails 1-5  bilaterally performed with a nail nipper.  Filed with dremel without incident.    Return office visit   3 months                   Told patient to return for periodic foot care and evaluation due to potential at risk complications.   Helane Gunther DPM

## 2022-08-11 ENCOUNTER — Emergency Department: Payer: Medicare Other

## 2022-08-11 ENCOUNTER — Other Ambulatory Visit: Payer: Self-pay

## 2022-08-11 ENCOUNTER — Inpatient Hospital Stay
Admission: EM | Admit: 2022-08-11 | Discharge: 2022-08-17 | DRG: 291 | Disposition: A | Discharge status: Skilled Nursing Facility | Payer: Medicare Other | Attending: Internal Medicine | Admitting: Internal Medicine

## 2022-08-11 DIAGNOSIS — K5900 Constipation, unspecified: Secondary | ICD-10-CM | POA: Diagnosis present

## 2022-08-11 DIAGNOSIS — M1990 Unspecified osteoarthritis, unspecified site: Secondary | ICD-10-CM | POA: Diagnosis present

## 2022-08-11 DIAGNOSIS — N1831 Chronic kidney disease, stage 3a: Secondary | ICD-10-CM | POA: Diagnosis present

## 2022-08-11 DIAGNOSIS — J9601 Acute respiratory failure with hypoxia: Secondary | ICD-10-CM | POA: Diagnosis present

## 2022-08-11 DIAGNOSIS — E1142 Type 2 diabetes mellitus with diabetic polyneuropathy: Secondary | ICD-10-CM | POA: Diagnosis present

## 2022-08-11 DIAGNOSIS — D72829 Elevated white blood cell count, unspecified: Secondary | ICD-10-CM | POA: Diagnosis present

## 2022-08-11 DIAGNOSIS — F32A Depression, unspecified: Secondary | ICD-10-CM | POA: Diagnosis present

## 2022-08-11 DIAGNOSIS — E871 Hypo-osmolality and hyponatremia: Secondary | ICD-10-CM | POA: Diagnosis present

## 2022-08-11 DIAGNOSIS — Z8249 Family history of ischemic heart disease and other diseases of the circulatory system: Secondary | ICD-10-CM

## 2022-08-11 DIAGNOSIS — Z79899 Other long term (current) drug therapy: Secondary | ICD-10-CM | POA: Diagnosis not present

## 2022-08-11 DIAGNOSIS — Z7982 Long term (current) use of aspirin: Secondary | ICD-10-CM

## 2022-08-11 DIAGNOSIS — Z888 Allergy status to other drugs, medicaments and biological substances status: Secondary | ICD-10-CM

## 2022-08-11 DIAGNOSIS — I10 Essential (primary) hypertension: Secondary | ICD-10-CM | POA: Diagnosis present

## 2022-08-11 DIAGNOSIS — Z833 Family history of diabetes mellitus: Secondary | ICD-10-CM

## 2022-08-11 DIAGNOSIS — R059 Cough, unspecified: Secondary | ICD-10-CM | POA: Diagnosis present

## 2022-08-11 DIAGNOSIS — Z8616 Personal history of COVID-19: Secondary | ICD-10-CM

## 2022-08-11 DIAGNOSIS — I2489 Other forms of acute ischemic heart disease: Secondary | ICD-10-CM | POA: Diagnosis present

## 2022-08-11 DIAGNOSIS — Z1152 Encounter for screening for COVID-19: Secondary | ICD-10-CM

## 2022-08-11 DIAGNOSIS — E669 Obesity, unspecified: Secondary | ICD-10-CM | POA: Diagnosis present

## 2022-08-11 DIAGNOSIS — Z7984 Long term (current) use of oral hypoglycemic drugs: Secondary | ICD-10-CM

## 2022-08-11 DIAGNOSIS — I350 Nonrheumatic aortic (valve) stenosis: Secondary | ICD-10-CM | POA: Diagnosis present

## 2022-08-11 DIAGNOSIS — Z823 Family history of stroke: Secondary | ICD-10-CM | POA: Diagnosis not present

## 2022-08-11 DIAGNOSIS — I130 Hypertensive heart and chronic kidney disease with heart failure and stage 1 through stage 4 chronic kidney disease, or unspecified chronic kidney disease: Principal | ICD-10-CM | POA: Diagnosis present

## 2022-08-11 DIAGNOSIS — I5A Non-ischemic myocardial injury (non-traumatic): Secondary | ICD-10-CM | POA: Diagnosis present

## 2022-08-11 DIAGNOSIS — E785 Hyperlipidemia, unspecified: Secondary | ICD-10-CM | POA: Diagnosis present

## 2022-08-11 DIAGNOSIS — Z6833 Body mass index (BMI) 33.0-33.9, adult: Secondary | ICD-10-CM

## 2022-08-11 DIAGNOSIS — I5033 Acute on chronic diastolic (congestive) heart failure: Secondary | ICD-10-CM | POA: Diagnosis present

## 2022-08-11 DIAGNOSIS — Z800 Family history of malignant neoplasm of digestive organs: Secondary | ICD-10-CM | POA: Diagnosis not present

## 2022-08-11 DIAGNOSIS — E1129 Type 2 diabetes mellitus with other diabetic kidney complication: Secondary | ICD-10-CM | POA: Diagnosis present

## 2022-08-11 DIAGNOSIS — I509 Heart failure, unspecified: Secondary | ICD-10-CM

## 2022-08-11 DIAGNOSIS — R110 Nausea: Secondary | ICD-10-CM | POA: Diagnosis present

## 2022-08-11 DIAGNOSIS — E1122 Type 2 diabetes mellitus with diabetic chronic kidney disease: Secondary | ICD-10-CM | POA: Diagnosis present

## 2022-08-11 DIAGNOSIS — Z981 Arthrodesis status: Secondary | ICD-10-CM

## 2022-08-11 HISTORY — DX: Heart failure, unspecified: I50.9

## 2022-08-11 LAB — TROPONIN I (HIGH SENSITIVITY)
Troponin I (High Sensitivity): 162 ng/L (ref <18)
Troponin I (High Sensitivity): 234 ng/L (ref <18)
Troponin I (High Sensitivity): 465 ng/L (ref <18)

## 2022-08-11 LAB — COMPREHENSIVE METABOLIC PANEL
ALT: 17 U/L (ref 0–44)
AST: 29 U/L (ref 15–41)
Albumin: 4.1 g/dL (ref 3.5–5.0)
Alkaline Phosphatase: 64 U/L (ref 38–126)
Anion gap: 13 (ref 5–15)
BUN: 24 mg/dL — ABNORMAL HIGH (ref 8–23)
CO2: 25 mmol/L (ref 22–32)
Calcium: 9.1 mg/dL (ref 8.9–10.3)
Chloride: 91 mmol/L — ABNORMAL LOW (ref 98–111)
Creatinine, Ser: 1.38 mg/dL — ABNORMAL HIGH (ref 0.44–1.00)
GFR, Estimated: 41 mL/min — ABNORMAL LOW (ref >60)
Glucose, Bld: 150 mg/dL — ABNORMAL HIGH (ref 70–99)
Potassium: 4.5 mmol/L (ref 3.5–5.1)
Sodium: 129 mmol/L — ABNORMAL LOW (ref 135–145)
Total Bilirubin: 0.6 mg/dL (ref 0.3–1.2)
Total Protein: 7 g/dL (ref 6.5–8.1)

## 2022-08-11 LAB — CBC
HCT: 42.2 % (ref 36.0–46.0)
Hemoglobin: 14.2 g/dL (ref 12.0–15.0)
MCH: 32.4 pg (ref 26.0–34.0)
MCHC: 33.6 g/dL (ref 30.0–36.0)
MCV: 96.3 fL (ref 80.0–100.0)
Platelets: 233 10*3/uL (ref 150–400)
RBC: 4.38 MIL/uL (ref 3.87–5.11)
RDW: 12.8 % (ref 11.5–15.5)
WBC: 13.9 10*3/uL — ABNORMAL HIGH (ref 4.0–10.5)
nRBC: 0 % (ref 0.0–0.2)

## 2022-08-11 LAB — BASIC METABOLIC PANEL
Anion gap: 11 (ref 5–15)
BUN: 24 mg/dL — ABNORMAL HIGH (ref 8–23)
CO2: 28 mmol/L (ref 22–32)
Calcium: 9.5 mg/dL (ref 8.9–10.3)
Chloride: 91 mmol/L — ABNORMAL LOW (ref 98–111)
Creatinine, Ser: 1.41 mg/dL — ABNORMAL HIGH (ref 0.44–1.00)
GFR, Estimated: 40 mL/min — ABNORMAL LOW (ref >60)
Glucose, Bld: 131 mg/dL — ABNORMAL HIGH (ref 70–99)
Potassium: 4.5 mmol/L (ref 3.5–5.1)
Sodium: 130 mmol/L — ABNORMAL LOW (ref 135–145)

## 2022-08-11 LAB — MAGNESIUM: Magnesium: 1.7 mg/dL (ref 1.7–2.4)

## 2022-08-11 LAB — PROTIME-INR
INR: 1 (ref 0.8–1.2)
Prothrombin Time: 13.3 seconds (ref 11.4–15.2)

## 2022-08-11 LAB — LIPID PANEL
Cholesterol: 102 mg/dL (ref 0–200)
HDL: 32 mg/dL — ABNORMAL LOW (ref >40)
LDL Cholesterol: 33 mg/dL (ref 0–99)
Total CHOL/HDL Ratio: 3.2 RATIO
Triglycerides: 186 mg/dL — ABNORMAL HIGH (ref <150)
VLDL: 37 mg/dL (ref 0–40)

## 2022-08-11 LAB — CBG MONITORING, ED: Glucose-Capillary: 134 mg/dL — ABNORMAL HIGH (ref 70–99)

## 2022-08-11 LAB — PROCALCITONIN: Procalcitonin: <0.1 ng/mL

## 2022-08-11 LAB — SARS CORONAVIRUS 2 BY RT PCR: SARS Coronavirus 2 by RT PCR: NEGATIVE

## 2022-08-11 LAB — BRAIN NATRIURETIC PEPTIDE: B Natriuretic Peptide: 1096.1 pg/mL — ABNORMAL HIGH (ref 0.0–100.0)

## 2022-08-11 LAB — APTT: aPTT: 31 seconds (ref 24–36)

## 2022-08-11 MED ORDER — SIMVASTATIN 20 MG PO TABS
40.0000 mg | ORAL_TABLET | Freq: Every evening | ORAL | Status: DC
  Administered 2022-08-11 – 2022-08-16 (×6): 40 mg via ORAL
  Filled 2022-08-11 (×3): qty 2
  Filled 2022-08-11: qty 4
  Filled 2022-08-11 (×2): qty 2

## 2022-08-11 MED ORDER — LORATADINE 10 MG PO TABS
10.0000 mg | ORAL_TABLET | Freq: Every day | ORAL | Status: DC | PRN
  Administered 2022-08-11 – 2022-08-16 (×4): 10 mg via ORAL
  Filled 2022-08-11 (×4): qty 1

## 2022-08-11 MED ORDER — NITROGLYCERIN 0.4 MG SL SUBL: 0.4000 mg | SUBLINGUAL_TABLET | SUBLINGUAL | Status: DC | PRN

## 2022-08-11 MED ORDER — ADULT MULTIVITAMIN W/MINERALS CH
1.0000 | ORAL_TABLET | Freq: Every day | ORAL | Status: DC
  Administered 2022-08-12 – 2022-08-17 (×6): 1 via ORAL
  Filled 2022-08-11 (×6): qty 1

## 2022-08-11 MED ORDER — CLONIDINE HCL 0.1 MG PO TABS
0.1000 mg | ORAL_TABLET | Freq: Every day | ORAL | Status: DC
  Administered 2022-08-11 – 2022-08-16 (×6): 0.1 mg via ORAL
  Filled 2022-08-11 (×6): qty 1

## 2022-08-11 MED ORDER — HYDRALAZINE HCL 20 MG/ML IJ SOLN: 5.0000 mg | INTRAMUSCULAR | Status: DC | PRN

## 2022-08-11 MED ORDER — INSULIN ASPART 100 UNIT/ML IJ SOLN
0.0000 [IU] | Freq: Three times a day (TID) | INTRAMUSCULAR | Status: DC
  Administered 2022-08-12: 2 [IU] via SUBCUTANEOUS
  Administered 2022-08-12: 1 [IU] via SUBCUTANEOUS
  Administered 2022-08-12: 2 [IU] via SUBCUTANEOUS
  Administered 2022-08-13 – 2022-08-14 (×4): 1 [IU] via SUBCUTANEOUS
  Administered 2022-08-14: 2 [IU] via SUBCUTANEOUS
  Administered 2022-08-14 – 2022-08-15 (×4): 1 [IU] via SUBCUTANEOUS
  Administered 2022-08-16: 2 [IU] via SUBCUTANEOUS
  Administered 2022-08-16 (×2): 1 [IU] via SUBCUTANEOUS
  Administered 2022-08-17: 2 [IU] via SUBCUTANEOUS
  Filled 2022-08-11 (×15): qty 1

## 2022-08-11 MED ORDER — HEPARIN BOLUS VIA INFUSION
4000.0000 [IU] | Freq: Once | INTRAVENOUS | Status: AC
  Administered 2022-08-11: 4000 [IU] via INTRAVENOUS
  Filled 2022-08-11: qty 4000

## 2022-08-11 MED ORDER — HEPARIN (PORCINE) 25000 UT/250ML-% IV SOLN
1300.0000 [IU]/h | INTRAVENOUS | Status: DC
  Administered 2022-08-11: 900 [IU]/h via INTRAVENOUS
  Administered 2022-08-12: 1150 [IU]/h via INTRAVENOUS
  Administered 2022-08-13 – 2022-08-14 (×2): 1300 [IU]/h via INTRAVENOUS
  Filled 2022-08-11 (×4): qty 250

## 2022-08-11 MED ORDER — METOPROLOL SUCCINATE ER 50 MG PO TB24
50.0000 mg | ORAL_TABLET | Freq: Every day | ORAL | Status: DC
  Administered 2022-08-11 – 2022-08-17 (×7): 50 mg via ORAL
  Filled 2022-08-11 (×7): qty 1

## 2022-08-11 MED ORDER — ASPIRIN 81 MG PO CHEW
324.0000 mg | CHEWABLE_TABLET | Freq: Once | ORAL | Status: AC
  Administered 2022-08-11: 324 mg via ORAL
  Filled 2022-08-11: qty 4

## 2022-08-11 MED ORDER — OYSTER SHELL CALCIUM/D3 500-5 MG-MCG PO TABS
1.0000 | ORAL_TABLET | Freq: Every day | ORAL | Status: DC
  Administered 2022-08-11 – 2022-08-17 (×7): 1 via ORAL
  Filled 2022-08-11 (×7): qty 1

## 2022-08-11 MED ORDER — DM-GUAIFENESIN ER 30-600 MG PO TB12
1.0000 | ORAL_TABLET | Freq: Two times a day (BID) | ORAL | Status: DC | PRN
  Administered 2022-08-11 – 2022-08-17 (×3): 1 via ORAL
  Filled 2022-08-11 (×4): qty 1

## 2022-08-11 MED ORDER — ACETAMINOPHEN 325 MG PO TABS
650.0000 mg | ORAL_TABLET | Freq: Four times a day (QID) | ORAL | Status: DC | PRN
  Administered 2022-08-16 – 2022-08-17 (×3): 650 mg via ORAL
  Filled 2022-08-11 (×3): qty 2

## 2022-08-11 MED ORDER — FUROSEMIDE 10 MG/ML IJ SOLN
60.0000 mg | Freq: Once | INTRAMUSCULAR | Status: AC
  Administered 2022-08-11: 60 mg via INTRAVENOUS
  Filled 2022-08-11: qty 8

## 2022-08-11 MED ORDER — AMLODIPINE BESYLATE 5 MG PO TABS
5.0000 mg | ORAL_TABLET | Freq: Every day | ORAL | Status: DC
  Administered 2022-08-11 – 2022-08-14 (×4): 5 mg via ORAL
  Filled 2022-08-11 (×4): qty 1

## 2022-08-11 MED ORDER — ASPIRIN 81 MG PO TBEC
81.0000 mg | DELAYED_RELEASE_TABLET | Freq: Every day | ORAL | Status: DC
  Administered 2022-08-12 – 2022-08-17 (×6): 81 mg via ORAL
  Filled 2022-08-11 (×6): qty 1

## 2022-08-11 MED ORDER — EZETIMIBE 10 MG PO TABS
10.0000 mg | ORAL_TABLET | Freq: Every day | ORAL | Status: DC
  Administered 2022-08-11 – 2022-08-16 (×6): 10 mg via ORAL
  Filled 2022-08-11 (×6): qty 1

## 2022-08-11 MED ORDER — SODIUM CHLORIDE 1 G PO TABS
1.0000 g | ORAL_TABLET | Freq: Two times a day (BID) | ORAL | Status: DC
  Administered 2022-08-11 – 2022-08-17 (×12): 1 g via ORAL
  Filled 2022-08-11 (×12): qty 1

## 2022-08-11 MED ORDER — ALBUTEROL SULFATE (2.5 MG/3ML) 0.083% IN NEBU: 2.5000 mg | INHALATION_SOLUTION | RESPIRATORY_TRACT | Status: DC | PRN

## 2022-08-11 MED ORDER — PANTOPRAZOLE SODIUM 40 MG PO TBEC
40.0000 mg | DELAYED_RELEASE_TABLET | Freq: Every day | ORAL | Status: DC
  Administered 2022-08-11 – 2022-08-17 (×7): 40 mg via ORAL
  Filled 2022-08-11 (×7): qty 1

## 2022-08-11 MED ORDER — INSULIN ASPART 100 UNIT/ML IJ SOLN: 0.0000 [IU] | Freq: Every day | INTRAMUSCULAR | Status: DC

## 2022-08-11 MED ORDER — ONDANSETRON HCL 4 MG/2ML IJ SOLN
4.0000 mg | Freq: Three times a day (TID) | INTRAMUSCULAR | Status: DC | PRN
  Administered 2022-08-11 – 2022-08-12 (×3): 4 mg via INTRAVENOUS
  Filled 2022-08-11 (×3): qty 2

## 2022-08-11 MED ORDER — GABAPENTIN 300 MG PO CAPS
300.0000 mg | ORAL_CAPSULE | Freq: Three times a day (TID) | ORAL | Status: DC
  Administered 2022-08-11 – 2022-08-17 (×17): 300 mg via ORAL
  Filled 2022-08-11 (×17): qty 1

## 2022-08-11 NOTE — H&P (Signed)
History and Physical    Jodi Chavez ZOX:[id number] DOB: Aug 04, 1950 DOA: 08/11/2022  Referring MD/NP/PA:   PCP: Fleet Contras, MD   Patient coming from:  The patient is coming from home.     Chief Complaint: SOB  HPI: Jodi Chavez is a 72 y.o. 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  Patient states that she started having shortness of breath since this morning, which has been progressively worsening. She has worsening leg edema bilaterally and orthopnea.Patient has dry cough, no fever or chills.  Patient has central chest discomfort.  Patient has loose stool bowel movement, no nausea, vomiting or abdominal pain.  No symptoms of UTI.  Patient does not have recent fall or head injury.  No rectal bleeding or dark stool.  Data reviewed independently and ED Course: pt was found to have trop 162--> 234, BNP 1096, WBC 13.9, negative COVID PCR, sodium 129, renal function slightly worsening at baseline, temperature normal, blood pressure 132/102, heart rate 89, RR 30, 22, oxygen saturation 87% on room air, which improved to 97% on 2 L oxygen (patient is not wearing oxygen normally at home).  Patient is admitted to telemetry bed as inpatient.  Dr. Juliann Pares of cardiology is consulted.   EKG: I have personally reviewed.  Sinus rhythm, QTc 456, ventricular bigeminy, RAD.   Review of Systems:   General: no fevers, chills, no body weight gain,  has fatigue HEENT: no blurry vision, hearing changes or sore throat Respiratory: has dyspnea, coughing, no wheezing CV: has chest discomfort, no palpitations GI: no nausea, vomiting, abdominal pain, diarrhea, constipation GU: no dysuria, burning on urination, increased urinary frequency, hematuria  Ext: Has leg edema Neuro: no unilateral weakness, numbness, or tingling, no vision change or  hearing loss Skin: no rash, no skin tear. MSK: No muscle spasm, no deformity, no limitation of range of movement in spin Heme: No easy bruising.  Travel history: No recent long distant travel.   Allergy:  Allergies  Allergen Reactions   Naproxen Sodium Rash    anaprox    Past Medical History:  Diagnosis Date   Arthritis    hip   CHF (congestive heart failure) (HCC)    Constipation    uses laxatives several times a week   Depression    Diabetes mellitus    takes Metformin and Amaryl daily   Dizziness    occasionally and related to meds    Early cataracts, bilateral    History of bronchitis    last time several yrs ago   History of migraine    many yrs ago   Hyperlipidemia    takes Zetia and Zocor daily   Hypertension    takes Benazepril nightly and Propranolol tid and Clonidine daily   Insomnia    Low back pain    Peripheral edema    Peripheral neuropathy    PONV (postoperative nausea and vomiting)    Slow urinary stream    occasionally    Past Surgical History:  Procedure Laterality Date   COLONOSCOPY     d&c/hysteroscopy/ablation     DILATION AND CURETTAGE OF UTERUS     couple of times   ESOPHAGOGASTRODUODENOSCOPY     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 She has never used smokeless tobacco. She reports that she does not drink alcohol and does not use drugs.  Family History:  Family History  Problem Relation Age of Onset   Cancer Mother    Stroke Mother    Diabetes Mother    Hypertension Mother    Colon cancer Mother 70       died 08-28-2005   Gout Father    Heart disease Father       Prior to Admission medications   Medication Sig Start Date End Date Taking? Authorizing Provider  ascorbic acid (VITAMIN C) 500 MG tablet Take 500 mg by mouth daily. 10/13/19   [provider]  aspirin 81 MG EC tablet Take 81 mg by mouth daily.    [provider]  benazepril (LOTENSIN) 40 MG tablet Take 40 mg by mouth daily. 05/18/15   [provider]  calcium-vitamin D (OSCAL WITH D) 500-200 MG-UNIT TABS tablet Take 1 tablet by mouth daily.    [provider]  cloNIDine (CATAPRES) 0.1 MG tablet Take 0.1 mg by mouth at bedtime. 04/11/22   [provider]  diphenhydrAMINE (BENADRYL) 25 MG tablet Take 1 tablet (25 mg total) by mouth every 6 (six) hours as needed for itching. 11/25/14   Rai, Ripudeep K, MD  ezetimibe (ZETIA) 10 MG tablet Take 10 mg by mouth at bedtime. 05/18/15   [provider]  fluticasone (FLONASE) 50 MCG/ACT nasal spray Place 1 spray into both nostrils daily. 04/18/15   [provider]  furosemide (LASIX) 20 MG tablet Take 20 mg by mouth daily as needed for fluid or edema. 03/02/18   [provider]  gabapentin (NEURONTIN) 300 MG capsule Take 300 mg by mouth 3 (three) times daily.    [provider]  Garlic 1000 MG CAPS Take 5,000 mg by mouth daily after breakfast.     [provider]  Glucosamine-Chondroitin 500-400 MG CAPS Take 1 tablet by mouth daily.    [provider]  hydrochlorothiazide (MICROZIDE) 12.5 MG capsule Take 12.5 mg by mouth daily. 04/11/22   [provider]  magnesium oxide (MAG-OX) 400 (241.3 Mg) MG tablet Take 0.5 tablets (200 mg total) by mouth 2 (two) times daily. 10/12/19   Regalado, Belkys A, MD  metFORMIN (GLUCOPHAGE) 850 MG tablet Take 850 mg by mouth daily with breakfast.    [provider]  metoprolol succinate (TOPROL-XL) 50 MG 24 hr tablet Take 50 mg by mouth daily. 04/28/22   [provider]  Multiple Vitamin (MULITIVITAMIN WITH  MINERALS) TABS Take 1 tablet by mouth daily after breakfast.     [provider]  OVER THE COUNTER MEDICATION Apply 1 application topically daily. sween cream applied to groin area daily to prevent rash    [provider]  pantoprazole (PROTONIX) 40 MG tablet Take 1 tablet (40 mg total) by mouth daily. 11/25/14   Rai, Ripudeep K, MD  polyethylene glycol powder (GLYCOLAX/MIRALAX) 17 GM/SCOOP powder Take by mouth. 10/13/19   [provider]  promethazine (PHENERGAN) 25 MG tablet Take 1 tablet (25 mg total) by mouth every 6 (six) hours as needed for nausea. 07/20/17   Molpus, John, MD  simvastatin (ZOCOR) 40 MG tablet Take 40 mg by mouth every evening. 05/18/15  [provider]  zinc sulfate 220 (50 Zn) MG capsule Take 1 capsule by mouth daily. 10/13/19   [provider]  DOXEPIN HCL PO Take by mouth.    05/10/11  [provider]  GABAPENTIN, PHN, PO Take by mouth.    05/10/11  [provider]    Physical Exam: Vitals:   08/11/22 1810 08/11/22 1815 08/11/22 1820 08/11/22 1825  BP:      Pulse: 75 (!) 37 81 65  Resp: 18 15 18 16   Temp:      TempSrc:      SpO2: 95% 90% 97% 94%  Weight:      Height:       General: Not in acute distress HEENT:       Eyes: PERRL, EOMI, no jaundice       ENT: No discharge from the ears and nose, no pharynx injection, no tonsillar enlargement.        Neck: 2+ JVD, no bruit, no mass felt. Heme: No neck lymph node enlargement. Cardiac: S1/S2, RRR, No murmurs, No gallops or rubs. Respiratory: has fine crackles bilaterally GI: Soft, nondistended, nontender, no rebound pain, no organomegaly, BS present. GU: No hematuria Ext: 2+ pitting leg edema bilaterally. 1+DP/PT pulse bilaterally. Musculoskeletal: No joint deformities, No joint redness or warmth, no limitation of ROM in spin. Skin: No rashes.  Neuro: Alert, oriented X3, cranial nerves II-XII grossly intact, moves all extremities normally.   Psych:  Patient is not psychotic, no suicidal or hemocidal ideation.  Labs on Admission: I have personally reviewed following labs and imaging studies  CBC: Recent Labs  Lab 08/11/22 1430  WBC 13.9*  HGB 14.2  HCT 42.2  MCV 96.3  PLT 233   Basic Metabolic Panel: Recent Labs  Lab 08/11/22 1430  NA 129*  K 4.5  CL 91*  CO2 25  GLUCOSE 150*  BUN 24*  CREATININE 1.38*  CALCIUM 9.1   GFR: Estimated Creatinine Clearance: 40.4 mL/min (A) (by C-G formula based on SCr of 1.38 mg/dL (H)). Liver Function Tests: Recent Labs  Lab 08/11/22 1430  AST 29  ALT 17  ALKPHOS 64  BILITOT 0.6  PROT 7.0  ALBUMIN 4.1   No results for input(s): "LIPASE", "AMYLASE" in the last 168 hours. No results for input(s): "AMMONIA" in the last 168 hours. Coagulation Profile: Recent Labs  Lab 08/11/22 1649  INR 1.0   Cardiac Enzymes: No results for input(s): "CKTOTAL", "CKMB", "CKMBINDEX", "TROPONINI" in the last 168 hours. BNP (last 3 results) No results for input(s): "PROBNP" in the last 8760 hours. HbA1C: No results for input(s): "HGBA1C" in the last 72 hours. CBG: No results for input(s): "GLUCAP" in the last 168 hours. Lipid Profile: Recent Labs    08/11/22 1703  CHOL 102  HDL 32*  LDLCALC 33  TRIG 161*  CHOLHDL 3.2   Thyroid Function Tests: No results for input(s): "TSH", "T4TOTAL", "FREET4", "T3FREE", "THYROIDAB" in the last 72 hours. Anemia Panel: No results for input(s): "VITAMINB12", "FOLATE", "FERRITIN", "TIBC", "IRON", "RETICCTPCT" in the last 72 hours. Urine analysis:    Component Value Date/Time   COLORURINE YELLOW (A) 05/27/2022 1500   APPEARANCEUR CLEAR (A) 05/27/2022 1500   LABSPEC 1.009 05/27/2022 1500   PHURINE 7.0 05/27/2022 1500   GLUCOSEU NEGATIVE 05/27/2022 1500   HGBUR NEGATIVE 05/27/2022 1500   BILIRUBINUR NEGATIVE 05/27/2022 1500   KETONESUR NEGATIVE 05/27/2022 1500   PROTEINUR NEGATIVE 05/27/2022 1500   UROBILINOGEN 0.2 11/21/2014 1835   NITRITE  NEGATIVE 05/27/2022  1500   LEUKOCYTESUR NEGATIVE 05/27/2022 1500   Sepsis Labs: @LABRCNTIP (procalcitonin:4,lacticidven:4) ) Recent Results (from the past 240 hour(s))  SARS Coronavirus 2 by RT PCR (hospital order, performed in [facility] hospital lab) *cepheid single result test* Anterior Nasal Swab     Status: None   Collection Time: 08/11/22  2:45 PM   Specimen: Anterior Nasal Swab  Result Value Ref Range Status   SARS Coronavirus 2 by RT PCR NEGATIVE NEGATIVE Final    Comment: (NOTE) SARS-CoV-2 target nucleic acids are NOT DETECTED.  The SARS-CoV-2 RNA is generally detectable in upper and lower respiratory specimens during the acute phase of infection. The lowest concentration of SARS-CoV-2 viral copies this assay can detect is 250 copies / mL. A negative result does not preclude SARS-CoV-2 infection and should not be used as the sole basis for treatment or other patient management decisions.  A negative result may occur with improper specimen collection / handling, submission of specimen other than nasopharyngeal swab, presence of viral mutation(s) within the areas targeted by this assay, and inadequate number of viral copies (<250 copies / mL). A negative result must be combined with clinical observations, patient history, and epidemiological information.  Fact Sheet for Patients:   RoadLapTop.co.za  Fact Sheet for Healthcare Providers: http://kim-miller.com/  This test is not yet approved or  cleared by the [place] FDA and has been authorized for detection and/or diagnosis of SARS-CoV-2 by FDA under an Emergency Use Authorization (EUA).  This EUA will remain in effect (meaning this test can be used) for the duration of the COVID-19 declaration under Section 564(b)(1) of the Act, 21 U.S.C. section 360bbb-3(b)(1), unless the authorization is terminated or revoked sooner.  Performed at [facility], [address] [place], [place] [address]      Radiological Exams on Admission: DG Chest 2 View  Result Date: 08/11/2022 CLINICAL DATA:  Shortness of breath EXAM: CHEST - 2 VIEW COMPARISON:  Chest x-ray 05/27/2022 FINDINGS: Central there are minimal hazy and interstitial opacities in the left lung base. Costophrenic angles are clear. No pneumothorax. Cardiomediastinal silhouette is within normal limits. No fractures are identified. IMPRESSION: Minimal hazy and interstitial opacities in the left lung base, atelectasis versus infection. Electronically Signed   By: Darliss Cheney M.D.   On: 08/11/2022 15:07      Assessment/Plan Principal Problem:   Acute on chronic diastolic CHF (congestive heart failure) (HCC) Active Problems:   Myocardial injury   Essential hypertension   Hyponatremia   HLD (hyperlipidemia)   Type II diabetes mellitus with renal manifestations (HCC)   Chronic kidney disease, stage 3a (HCC)   Leukocytosis   Obesity (BMI 30-39.9)   Assessment and Plan:  Acute on chronic diastolic CHF (congestive heart failure) (HCC): 2D echo 10/07/2019 showed EF 50-55%.  Patient has a 2+ leg edema, positive JVD, elevated BNP 1096, clinically consistent with CHF exacerbation. Pt has hyponatremia with sodium 129,  will give one dose of 60 mg of Lasix now, and monitoring sodium level before giving further diuretics.  -Will admit to tele bed inpatient -Lasix 60 mg bid by IV x 1 dose -2d echo -Daily weights -strict I/O's -Fluid restriction -As needed bronchodilators for shortness of breath  Myocardial injury: trop 162 --> 234.  Patient has central chest discomfort, likely due to demand ischemia, but cannot completely rule out a non-STEMI.  IV heparin started in ED.  Consulted Dr. Juliann Pares of cardiology - IV heparin - Trend Trop - prn Nitroglycerin - aspirin, Zocor -  Risk factor stratification: will check FLP and A1C  - f/u 2d echo  Essential hypertension: -hold Lotensin and HCTZ due to  hyponatremia -IV hydralazine as needed -Continue amlodipine, clonidine, metoprolol  Hyponatremia: Sodium 129, mental status normal. -Hold HCTZ, Lotensin -Follow-up BMP every 8 hours -Fluid restriction  HLD (hyperlipidemia) -Zocor and Zetia  Type II diabetes mellitus with renal manifestations (HCC): Recent A1c 6.6, well-controlled.  Patient taking metformin -SSI  Chronic kidney disease, stage 3a [facility]): Renal function slightly worsening than baseline.  Her creatinine is 1.38, BUN 24, GFR 41.  Baseline creatinine 1.12-1.25 recently. -Follow-up renal function by BMI  Leukocytosis: WBC 13.9.  No fever.  Chest x-ray showed minimal hazy left basilar opacity, but procalcitonin<0.10.  Does not seem to have pneumonia clinically.  Likely reactive. -Follow-up with CBC  Obesity (BMI 30-39.9): Body weight 86.6 kg, BMI 32.79 -Encourage losing weight -Exercise and healthy diet        DVT ppx: on IV Heparin  Code Status: Full code    Family Communication:   Yes, patient's nephew by phone  Disposition Plan:  Anticipate discharge back to previous environment  Consults called:  Dr. Juliann Pares of card  Admission status and Level of care: Telemetry Cardiac:   as inpt       Dispo: The patient is from: Home              Anticipated d/c is to: Home              Anticipated d/c date is: 2 days              Patient currently is not medically stable to d/c.    Severity of Illness:  The appropriate patient status for this patient is INPATIENT. Inpatient status is judged to be reasonable and necessary in order to provide the required intensity of service to ensure the patient's safety. The patient's presenting symptoms, physical exam findings, and initial radiographic and laboratory data in the context of their chronic comorbidities is felt to place them at high risk for further clinical deterioration. Furthermore, it is not anticipated that the patient will be medically stable for discharge  from the hospital within 2 midnights of admission.   * I certify that at the point of admission it is my clinical judgment that the patient will require inpatient hospital care spanning beyond 2 midnights from the point of admission due to high intensity of service, high risk for further deterioration and high frequency of surveillance required.*       Date of Service 08/11/2022    Lorretta Harp [facility]   If 7PM-7AM, please contact night-coverage [email or web address] 08/11/2022, 6:31 PM

## 2022-08-11 NOTE — ED Notes (Signed)
Dr Niu at bedside 

## 2022-08-11 NOTE — ED Provider Notes (Signed)
[facility] Provider Note    Event Date/Time   First MD Initiated Contact with Patient 08/11/22 1456     (approximate)   History   Shortness of Breath   HPI  Jodi Chavez is a 72 y.o. 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  Does not wear home oxygen was found to be hypoxic on room air requiring supplemental oxygen.  Denies any pleuritic chest pain.  Is endorsing worsening orthopnea.  Has noted some worsening lower extremity swelling.     Physical Exam   Triage Vital Signs: ED Triage Vitals  Enc Vitals Group     BP 08/11/22 1418 (!) 157/63     Pulse Rate 08/11/22 1418 89     Resp 08/11/22 1418 20     Temp 08/11/22 1433 97.6 F (36.4 C)     Temp Source 08/11/22 1433 Oral     SpO2 08/11/22 1418 93 %     Weight 08/11/22 1418 191 lb (86.6 kg)     Height 08/11/22 1418 5\' 4"  (1.626 m)     Head Circumference --      Peak Flow --      Pain Score 08/11/22 1418 0     Pain Loc --      Pain Edu? --      Excl. in GC? --     Most recent vital signs: Vitals:   08/11/22 1630 08/11/22 1700  BP: (!) 132/102 (!) 141/105  Pulse:  (!) 39  Resp: (!) 22 20  Temp:    SpO2: 97% 96%     Constitutional: Alert  Eyes: Conjunctivae are normal.  Head: Atraumatic. Nose: No congestion/rhinnorhea. Mouth/Throat: Mucous membranes are moist.   Neck: Painless ROM.  Cardiovascular:   Good peripheral circulation. Respiratory: Normal respiratory effort.  No retractions.  Gastrointestinal: Soft and nontender.  Musculoskeletal:  no deformity Neurologic:  MAE spontaneously. No gross focal neurologic deficits are appreciated.  Skin:  Skin is warm, dry and intact. No rash noted. Psychiatric: Mood and affect are normal. Speech and behavior are normal.    ED Results / Procedures / Treatments   Labs (all labs ordered are listed, but  only abnormal results are displayed) Labs Reviewed  CBC - Abnormal; Notable for the following components:      Result Value   WBC 13.9 (*)    All other components within normal limits  COMPREHENSIVE METABOLIC PANEL - Abnormal; Notable for the following components:   Sodium 129 (*)    Chloride 91 (*)    Glucose, Bld 150 (*)    BUN 24 (*)    Creatinine, Ser 1.38 (*)    GFR, Estimated 41 (*)    All other components within normal limits  BRAIN NATRIURETIC PEPTIDE - Abnormal; Notable for the following components:   B Natriuretic Peptide 1,096.1 (*)    All other components within normal limits  TROPONIN I (HIGH SENSITIVITY) - Abnormal; Notable for the following components:   Troponin I (High Sensitivity) 162 (*)    All other components within normal limits  SARS CORONAVIRUS 2 BY RT PCR  PROCALCITONIN  APTT  PROTIME-INR  HEPARIN LEVEL (UNFRACTIONATED)  CBC  TROPONIN I (HIGH SENSITIVITY)     EKG  ED ECG REPORT I, Willy Eddy, the attending physician, personally viewed and interpreted this ECG.   Date: 08/11/2022  EKG Time: 14:26  Rate: 85  Rhythm: bigeminy  Axis: right  Intervals: normal qt  ST&T Change: no stemi, nodepression    RADIOLOGY Please see ED Course for my review and interpretation.  I personally reviewed all radiographic images ordered to evaluate for the above acute complaints and reviewed radiology reports and findings.  These findings were personally discussed with the patient.  Please see medical record for radiology report.    PROCEDURES:  Critical Care performed: Yes, see critical care procedure note(s)  .Critical Care  Performed by: Willy Eddy, MD Authorized by: Willy Eddy, MD   Critical care provider statement:    Critical care time (minutes):  35   Critical care was necessary to treat or prevent imminent or life-threatening deterioration of the following conditions:  Respiratory failure   Critical care was time spent  personally by me on the following activities:  Ordering and performing treatments and interventions, ordering and review of laboratory studies, ordering and review of radiographic studies, pulse oximetry, re-evaluation of patient's condition, review of old charts, obtaining history from patient or surrogate, examination of patient, evaluation of patient's response to treatment, discussions with primary provider, discussions with consultants and development of treatment plan with patient or surrogate    MEDICATIONS ORDERED IN ED: Medications  aspirin chewable tablet 324 mg (324 mg Oral Given 08/11/22 1650)  furosemide (LASIX) injection 60 mg (60 mg Intravenous Given 08/11/22 1703)     IMPRESSION / MDM / ASSESSMENT AND PLAN / ED COURSE  I reviewed the triage vital signs and the nursing notes.                              Differential diagnosis includes, but is not limited to, Asthma, copd, CHF, pna, ptx, malignancy, Pe, anemia  Patient presenting to the ER for evaluation of symptoms as described above.  Based on symptoms, risk factors and considered above differential, this presenting complaint could reflect a potentially life-threatening illness therefore the patient will be placed on continuous pulse oximetry and telemetry for monitoring.  Laboratory evaluation will be sent to evaluate for the above complaints.  Patient with evidence of acute respiratory failure with hypoxia.  Does show diffuse edema but not significantly increased weight compared to previous.  Chest x-ray on my review and interpretation shows possible mild edema or atelectasis versus pneumonia.  Will order procalcitonin to further stratify will add on BNP.  Will check troponin.    Clinical Course as of 08/11/22 1712  Sat Aug 11, 2022  1645 Procalcitonin is less than 10.  That this is more reflective of cardiac etiologies of mild edema will give IV Lasix.  Will heparinize given her elevated troponin with some chest pain.  Will  consult hospitalist for admission. [PR]    Clinical Course User Index [PR] Willy Eddy, MD     FINAL CLINICAL IMPRESSION(S) / ED DIAGNOSES   Final diagnoses:  Acute respiratory failure with hypoxia (HCC)  Acute on chronic congestive heart failure, unspecified heart failure type (HCC)     Rx / DC Orders   ED Discharge Orders     None        Note:  This document was prepared using Dragon voice recognition software and may include unintentional dictation errors.    Willy Eddy, MD 08/11/22 [phone]

## 2022-08-11 NOTE — ED Notes (Signed)
Assumed care of pt, she is on CCM, has call bell in bed, NAD, no needs at this time

## 2022-08-11 NOTE — ED Notes (Signed)
Helped pt out of bed to void. Pt walked with walker to toilet in room.

## 2022-08-11 NOTE — ED Triage Notes (Signed)
Pt to ED [facility] from home c/o SOB since a few hours ago, 12 lead with EMS showed bigeminy. Pt also feels like upper abdomen is full and feels nauseous. Does not wear home oxygen. Lungs clear. Hx CHF, both ankles swollen with +2 pitting to knees. Pt had myocarditis after covid with ongoing problems since then. Family told EMS pt "not all there". EMS unsure if hx dementia. Pt takes PO lasix, did not take today. Pt states cannot sleep lying flat and states she is NOT SOB, just nauseous.   150/72, 92% RA, HR 70 (ventricular rate, in bigeminy), CBG 173  EMS 22# to L lateral AC

## 2022-08-11 NOTE — ED Notes (Signed)
Pt to xray

## 2022-08-11 NOTE — ED Notes (Signed)
Moved from h/w 12 to room 11. Returned to monitor. VSS. SPO2 87% on RA. Applied 2L [place].

## 2022-08-11 NOTE — Progress Notes (Signed)
ANTICOAGULATION CONSULT NOTE - Initial Consult  Pharmacy Consult for IV heparin Indication: chest pain/ACS  Allergies  Allergen Reactions   Naproxen Sodium Rash    anaprox    Patient Measurements: Height: 5\' 4"  (162.6 cm) Weight: 86.6 kg (191 lb) IBW/kg (Calculated) : 54.7 Heparin Dosing Weight: 73.9 kg  Vital Signs: Temp: 97.6 F (36.4 C) (06/15 1433) Temp Source: Oral (06/15 1433) BP: 132/102 (06/15 1630) Pulse Rate: 73 (06/15 1545)  Labs: Recent Labs    08/11/22 1430  HGB 14.2  HCT 42.2  PLT 233  CREATININE 1.38*  TROPONINIHS 162*    Estimated Creatinine Clearance: 40.4 mL/min (A) (by C-G formula based on SCr of 1.38 mg/dL (H)).   Medical History: Past Medical History:  Diagnosis Date   Arthritis    hip   CHF (congestive heart failure) (HCC)    Constipation    uses laxatives several times a week   Depression    Diabetes mellitus    takes Metformin and Amaryl daily   Dizziness    occasionally and related to meds    Early cataracts, bilateral    History of bronchitis    last time several yrs ago   History of migraine    many yrs ago   Hyperlipidemia    takes Zetia and Zocor daily   Hypertension    takes Benazepril nightly and Propranolol tid and Clonidine daily   Insomnia    Low back pain    Peripheral edema    Peripheral neuropathy    PONV (postoperative nausea and vomiting)    Slow urinary stream    occasionally    Medications:  No anticoagulation prior to admission per my chart review  Assessment: 72 year old female presenting with chest pain.  Initial troponins 162. PT-INR and aPTT in process  Goal of Therapy:  Heparin level 0.3-0.7 units/ml Monitor platelets by anticoagulation protocol: Yes   Plan:  Give 4000 units bolus x 1 Start heparin infusion at 900 units/hr Check anti-Xa level in 8 hours and daily while on heparin Continue to monitor H&H and platelets  Elliot Gurney, PharmD, BCPS Clinical Pharmacist   08/11/2022 5:07 PM

## 2022-08-12 ENCOUNTER — Inpatient Hospital Stay
Admit: 2022-08-12 | Discharge: 2022-08-12 | Disposition: A | Discharge status: Home / Self Care | Payer: Medicare Other | Attending: Internal Medicine | Admitting: Internal Medicine

## 2022-08-12 DIAGNOSIS — I5033 Acute on chronic diastolic (congestive) heart failure: Secondary | ICD-10-CM

## 2022-08-12 LAB — ECHOCARDIOGRAM COMPLETE
AR max vel: 1.56 cm2
AV Area VTI: 0.97 cm2
AV Area mean vel: 0.99 cm2
AV Mean grad: 11 mmHg
AV Peak grad: 9.5 mmHg
Ao pk vel: 1.54 m/s
Area-P 1/2: 4.21 cm2
Calc EF: 55.9 %
Height: 64 in
S' Lateral: 3.3 cm
Single Plane A2C EF: 60.6 %
Single Plane A4C EF: 56.4 %
Weight: 3056 oz

## 2022-08-12 LAB — BASIC METABOLIC PANEL
Anion gap: 9 (ref 5–15)
Anion gap: 8 (ref 5–15)
Anion gap: 9 (ref 5–15)
BUN: 23 mg/dL (ref 8–23)
BUN: 25 mg/dL — ABNORMAL HIGH (ref 8–23)
BUN: 26 mg/dL — ABNORMAL HIGH (ref 8–23)
CO2: 30 mmol/L (ref 22–32)
CO2: 29 mmol/L (ref 22–32)
CO2: 28 mmol/L (ref 22–32)
Calcium: 9.4 mg/dL (ref 8.9–10.3)
Calcium: 9 mg/dL (ref 8.9–10.3)
Calcium: 9.1 mg/dL (ref 8.9–10.3)
Chloride: 93 mmol/L — ABNORMAL LOW (ref 98–111)
Chloride: 95 mmol/L — ABNORMAL LOW (ref 98–111)
Chloride: 95 mmol/L — ABNORMAL LOW (ref 98–111)
Creatinine, Ser: 1.34 mg/dL — ABNORMAL HIGH (ref 0.44–1.00)
Creatinine, Ser: 1.34 mg/dL — ABNORMAL HIGH (ref 0.44–1.00)
Creatinine, Ser: 1.61 mg/dL — ABNORMAL HIGH (ref 0.44–1.00)
GFR, Estimated: 43 mL/min — ABNORMAL LOW (ref >60)
GFR, Estimated: 43 mL/min — ABNORMAL LOW (ref >60)
GFR, Estimated: 34 mL/min — ABNORMAL LOW (ref >60)
Glucose, Bld: 118 mg/dL — ABNORMAL HIGH (ref 70–99)
Glucose, Bld: 144 mg/dL — ABNORMAL HIGH (ref 70–99)
Glucose, Bld: 176 mg/dL — ABNORMAL HIGH (ref 70–99)
Potassium: 4.6 mmol/L (ref 3.5–5.1)
Potassium: 4.7 mmol/L (ref 3.5–5.1)
Potassium: 4.7 mmol/L (ref 3.5–5.1)
Sodium: 132 mmol/L — ABNORMAL LOW (ref 135–145)
Sodium: 132 mmol/L — ABNORMAL LOW (ref 135–145)
Sodium: 132 mmol/L — ABNORMAL LOW (ref 135–145)

## 2022-08-12 LAB — CBC
HCT: 36.3 % (ref 36.0–46.0)
Hemoglobin: 12.1 g/dL (ref 12.0–15.0)
MCH: 32.5 pg (ref 26.0–34.0)
MCHC: 33.3 g/dL (ref 30.0–36.0)
MCV: 97.6 fL (ref 80.0–100.0)
Platelets: 201 10*3/uL (ref 150–400)
RBC: 3.72 MIL/uL — ABNORMAL LOW (ref 3.87–5.11)
RDW: 13 % (ref 11.5–15.5)
WBC: 9.9 10*3/uL (ref 4.0–10.5)
nRBC: 0 % (ref 0.0–0.2)

## 2022-08-12 LAB — HEPARIN LEVEL (UNFRACTIONATED)
Heparin Unfractionated: 0.16 IU/mL — ABNORMAL LOW (ref 0.30–0.70)
Heparin Unfractionated: 0.3 IU/mL (ref 0.30–0.70)
Heparin Unfractionated: 0.26 IU/mL — ABNORMAL LOW (ref 0.30–0.70)

## 2022-08-12 LAB — GLUCOSE, CAPILLARY
Glucose-Capillary: 131 mg/dL — ABNORMAL HIGH (ref 70–99)
Glucose-Capillary: 151 mg/dL — ABNORMAL HIGH (ref 70–99)

## 2022-08-12 LAB — HEMOGLOBIN A1C
Hgb A1c MFr Bld: 6.4 % — ABNORMAL HIGH (ref 4.8–5.6)
Mean Plasma Glucose: 136.98 mg/dL

## 2022-08-12 LAB — TROPONIN I (HIGH SENSITIVITY)
Troponin I (High Sensitivity): 627 ng/L (ref <18)
Troponin I (High Sensitivity): 636 ng/L (ref <18)

## 2022-08-12 LAB — CBG MONITORING, ED
Glucose-Capillary: 154 mg/dL — ABNORMAL HIGH (ref 70–99)
Glucose-Capillary: 170 mg/dL — ABNORMAL HIGH (ref 70–99)

## 2022-08-12 LAB — MAGNESIUM: Magnesium: 1.7 mg/dL (ref 1.7–2.4)

## 2022-08-12 MED ORDER — HEPARIN BOLUS VIA INFUSION
2200.0000 [IU] | Freq: Once | INTRAVENOUS | Status: AC
  Administered 2022-08-12: 2200 [IU] via INTRAVENOUS
  Filled 2022-08-12: qty 2200

## 2022-08-12 MED ORDER — PERFLUTREN LIPID MICROSPHERE
1.0000 mL | INTRAVENOUS | Status: AC | PRN
  Administered 2022-08-12: 4 mL via INTRAVENOUS

## 2022-08-12 MED ORDER — HEPARIN BOLUS VIA INFUSION
1100.0000 [IU] | Freq: Once | INTRAVENOUS | Status: AC
  Administered 2022-08-12: 1100 [IU] via INTRAVENOUS
  Filled 2022-08-12: qty 1100

## 2022-08-12 NOTE — Progress Notes (Signed)
ANTICOAGULATION CONSULT NOTE  Pharmacy Consult for IV heparin Indication: chest pain/ACS  Allergies  Allergen Reactions   Naproxen Sodium Rash    anaprox    Patient Measurements: Height: 5\' 4"  (162.6 cm) Weight: 86.6 kg (191 lb) IBW/kg (Calculated) : 54.7 Heparin Dosing Weight: 73.9 kg  Vital Signs: Temp: 97.7 F (36.5 C) (06/16 1112) Temp Source: Oral (06/16 1112) BP: 134/74 (06/16 1112) Pulse Rate: 79 (06/16 1112)  Labs: Recent Labs    08/11/22 1430 08/11/22 1649 08/11/22 2204 08/12/22 0117 08/12/22 0214 08/12/22 1040  HGB 14.2  --   --   --   --  12.1  HCT 42.2  --   --   --   --  36.3  PLT 233  --   --   --   --  201  APTT  --  31  --   --   --   --   LABPROT  --  13.3  --   --   --   --   INR  --  1.0  --   --   --   --   HEPARINUNFRC  --   --   --  0.16*  --  0.30  CREATININE 1.38*  --  1.41*  --  1.34*  --   TROPONINIHS 162* 234* 465* 627* 636*  --      Estimated Creatinine Clearance: 41.6 mL/min (A) (by C-G formula based on SCr of 1.34 mg/dL (H)).   Medical History: Past Medical History:  Diagnosis Date   Arthritis    hip   CHF (congestive heart failure) (HCC)    Constipation    uses laxatives several times a week   Depression    Diabetes mellitus    takes Metformin and Amaryl daily   Dizziness    occasionally and related to meds    Early cataracts, bilateral    History of bronchitis    last time several yrs ago   History of migraine    many yrs ago   Hyperlipidemia    takes Zetia and Zocor daily   Hypertension    takes Benazepril nightly and Propranolol tid and Clonidine daily   Insomnia    Low back pain    Peripheral edema    Peripheral neuropathy    PONV (postoperative nausea and vomiting)    Slow urinary stream    occasionally    Medications:  No anticoagulation prior to admission per my chart review  Assessment: 72 year old female presenting with chest pain.  Initial troponins 162. PT-INR and aPTT in process  Goal of  Therapy:  Heparin level 0.3-0.7 units/ml Monitor platelets by anticoagulation protocol: Yes   06/16 0117 HL 0.16, subtherapeutic 06/16 1040 HL 0.30  therapeutic x1   Plan: 06/16 1040 HL 0.30  therapeutic x1 (barely) Will slightly Increase heparin infusion to 1150 units/hr check confirmatory HL in 8 hrs after rate change CBC daily while on heparin   Bari Mantis PharmD Clinical Pharmacist 08/12/2022

## 2022-08-12 NOTE — Progress Notes (Signed)
  Echocardiogram 2D Echocardiogram has been performed. Definity IV ultrasound imaging agent used on this study.  Jodi Chavez 08/12/2022, 10:03 AM

## 2022-08-12 NOTE — Consult Note (Signed)
CARDIOLOGY CONSULT NOTE               Patient ID: Jodi Chavez MRN: [id number] DOB/AGE: 03/26/1950 72 y.o.  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  Patient states she started having shortness of breath yesterday morning and got progressively worse bilateral leg edema orthopnea patient had a dry cough but no fever chills or sweats she has had some vague central chest discomfort poss related to coughing she has had loose stools no nausea vomiting or diarrhea no other urinary symptoms when she came to emergency room she had an elevated BNP of over thousand borderline troponins and mild hyponatremia patient was hypoxic sats of 87 so she was placed on 2 L given diuretic treatment was placed on telemetry and admitted for further evaluation EKG was unremarkable Diagnostic  Review of systems complete and found to be negative unless listed above     Past Medical History:  Diagnosis Date   Arthritis    hip   CHF (congestive heart failure) (HCC)    Constipation    uses laxatives several times a week   Depression    Diabetes mellitus    takes Metformin and Amaryl daily   Dizziness    occasionally and related to meds    Early cataracts, bilateral    History of bronchitis    last time several yrs ago   History of migraine    many yrs ago   Hyperlipidemia    takes Zetia and Zocor daily   Hypertension    takes Benazepril nightly and Propranolol tid and Clonidine daily   Insomnia    Low back pain    Peripheral edema    Peripheral neuropathy    PONV (postoperative nausea and vomiting)    Slow urinary  stream    occasionally    Past Surgical History:  Procedure Laterality Date   COLONOSCOPY     d&c/hysteroscopy/ablation     DILATION AND CURETTAGE OF UTERUS     couple of times   ESOPHAGOGASTRODUODENOSCOPY     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   POSTERIOR CERVICAL FUSION/FORAMINOTOMY  10/15/2011   Procedure: POSTERIOR CERVICAL FUSION/FORAMINOTOMY LEVEL 2;  Surgeon: Kerrin Champagne, MD;  Location: [facility] OR;  Service: Orthopedics;  Laterality: N/A;  Right C6-7, C7-T1 Foraminotomy with excision HNP C7-T1   UPPER GASTROINTESTINAL ENDOSCOPY      (Not in a hospital admission)  Social History   Socioeconomic History   Marital status: Single    Spouse name: Not on file   Number of children: Not on file   Years of education: Not on file   Highest education level: Not on file  Occupational History   Not on file  Tobacco Use   Smoking status: Never   Smokeless tobacco: Never  Substance and Sexual Activity   Alcohol use: No   Drug use: No   Sexual activity: Never  Other Topics Concern   Not on file  Social History Narrative   Not on file   Social Determinants of Health   Financial Resource Strain: Not on file  Food Insecurity: No Food Insecurity (05/27/2022)   Hunger Vital Sign    Worried About Running Out  of Food in the Last Year: Never true    Ran Out of Food in the Last Year: Never true  Transportation Needs: No Transportation Needs (05/27/2022)   PRAPARE - Administrator, Civil Service (Medical): No    Lack of Transportation (Non-Medical): No  Physical Activity: Not on file  Stress: Not on file  Social Connections: Not on file  Intimate Partner Violence: Not At Risk (05/27/2022)   Humiliation, Afraid, Rape, and Kick questionnaire    Fear of Current or Ex-Partner: No    Emotionally Abused: No    Physically Abused: No    Sexually Abused: No    Family History  Problem Relation Age of Onset   Cancer Mother    Stroke Mother    Diabetes  Mother    Hypertension Mother    Colon cancer Mother 31       died 09/01/05   Gout Father    Heart disease Father       Review of systems complete and found to be negative unless listed above      PHYSICAL EXAM  General: Well developed, well nourished, in no acute distress HEENT:  Normocephalic and atramatic Neck:  No JVD.  Lungs: Clear bilaterally to auscultation and percussion. Heart: HRRR . Normal S1 and S2 without gallops or murmurs.  Abdomen: Bowel sounds are positive, abdomen soft and non-tender  Msk:  Back normal, normal gait. Normal strength and tone for age. Extremities: No clubbing, cyanosis or 2+edema.   Neuro: Alert and oriented X 3. Psych:  Good affect, responds appropriately  Labs:   Lab Results  Component Value Date   WBC 9.9 08/12/2022   HGB 12.1 08/12/2022   HCT 36.3 08/12/2022   MCV 97.6 08/12/2022   PLT 201 08/12/2022    Recent Labs  Lab 08/11/22 1430 08/11/22 02-Sep-2202 08/12/22 1259  NA 129*   < > 132*  K 4.5   < > 4.7  CL 91*   < > 95*  CO2 25   < > 29  BUN 24*   < > 25*  CREATININE 1.38*   < > 1.34*  CALCIUM 9.1   < > 9.0  PROT 7.0  --   --   BILITOT 0.6  --   --   ALKPHOS 64  --   --   ALT 17  --   --   AST 29  --   --   GLUCOSE 150*   < > 144*   < > = values in this interval not displayed.   Lab Results  Component Value Date   CKTOTAL 101 09/07/2016    Lab Results  Component Value Date   CHOL 102 08/11/2022   CHOL 132 03/26/2022   CHOL 125 05/06/2020   Lab Results  Component Value Date   HDL 32 (L) 08/11/2022   HDL 36 (L) 03/26/2022   HDL 35 (L) 05/06/2020   Lab Results  Component Value Date   LDLCALC 33 08/11/2022   LDLCALC 59 03/26/2022   LDLCALC 59 05/06/2020   Lab Results  Component Value Date   TRIG 186 (H) 08/11/2022   TRIG 349 (H) 03/26/2022   TRIG 297 (H) 05/06/2020   Lab Results  Component Value Date   CHOLHDL 3.2 08/11/2022   CHOLHDL 3.7 03/26/2022   CHOLHDL 3.6 05/06/2020   No results found for:  "LDLDIRECT"    Radiology: ECHOCARDIOGRAM COMPLETE  Result Date: 08/12/2022    ECHOCARDIOGRAM REPORT   Patient  Name:   Jodi Chavez Date of Exam: 08/12/2022 Medical Rec #:  [id number]      Height:       64.0 in Accession #:    [id number]     Weight:       191.0 lb Date of Birth:  01/14/1951      BSA:          1.918 m Patient Age:    71 years       BP:           108/59 mmHg Patient Gender: F              HR:           79 bpm. Exam Location:  [facility] Procedure: 2D Echo and Intracardiac Opacification Agent Indications:     CHF I50.31  History:         Patient has prior history of Echocardiogram examinations, most                  recent 10/08/2019.  Sonographer:     Overton Mam RDCS, FASE Referring Phys:  Wynona Neat NIU Diagnosing Phys: Alwyn Pea MD  Sonographer Comments: Technically difficult study due to poor echo windows, suboptimal apical window, no subcostal window and patient is obese. Image acquisition challenging due to COPD and Image acquisition challenging due to respiratory motion. IMPRESSIONS  1. Left ventricular ejection fraction, by estimation, is 50 to 55%. The left ventricle has low normal function. The left ventricle demonstrates regional wall motion abnormalities (see scoring diagram/findings for description). Left ventricular diastolic  parameters were normal.  2. Right ventricular systolic function is normal. The right ventricular size is normal.  3. The mitral valve is normal in structure. Trivial mitral valve regurgitation.  4. The aortic valve is grossly normal. Aortic valve regurgitation is not visualized. Moderate aortic valve stenosis. FINDINGS  Left Ventricle: Apical septal hypokinesis. Left ventricular ejection fraction, by estimation, is 50 to 55%. The left ventricle has low normal function. The left ventricle demonstrates regional wall motion abnormalities. Definity contrast agent was given  IV to delineate the left ventricular endocardial borders. The left ventricular internal  cavity size was normal in size. There is borderline left ventricular hypertrophy. Left ventricular diastolic parameters were normal. Right Ventricle: The right ventricular size is normal. No increase in right ventricular wall thickness. Right ventricular systolic function is normal. Left Atrium: Left atrial size was normal in size. Right Atrium: Right atrial size was normal in size. Pericardium: There is no evidence of pericardial effusion. Mitral Valve: The mitral valve is normal in structure. Trivial mitral valve regurgitation. Tricuspid Valve: The tricuspid valve is normal in structure. Tricuspid valve regurgitation is mild. Aortic Valve: The aortic valve is grossly normal. Aortic valve regurgitation is not visualized. Moderate aortic stenosis is present. Aortic valve mean gradient measures 11.0 mmHg. Aortic valve peak gradient measures 9.5 mmHg. Aortic valve area, by VTI measures 0.97 cm. Pulmonic Valve: The pulmonic valve was normal in structure. Pulmonic valve regurgitation is not visualized. Aorta: The ascending aorta was not well visualized. IAS/Shunts: No atrial level shunt detected by color flow Doppler.  LEFT VENTRICLE PLAX 2D LVIDd:         4.60 cm      Diastology LVIDs:         3.30 cm      LV e' medial:    9.68 cm/s LV PW:         1.10 cm  LV E/e' medial:  12.7 LV IVS:        0.95 cm      LV e' lateral:   7.07 cm/s LVOT diam:     1.90 cm      LV E/e' lateral: 17.4 LV SV:         49 LV SV Index:   26 LVOT Area:     2.84 cm  LV Volumes (MOD) LV vol d, MOD A2C: 112.0 ml LV vol d, MOD A4C: 85.5 ml LV vol s, MOD A2C: 44.1 ml LV vol s, MOD A4C: 37.3 ml LV SV MOD A2C:     67.9 ml LV SV MOD A4C:     85.5 ml LV SV MOD BP:      54.8 ml RIGHT VENTRICLE RV Basal diam:  2.80 cm RV S prime:     14.10 cm/s LEFT ATRIUM            Index        RIGHT ATRIUM          Index LA diam:      3.90 cm  2.03 cm/m   RA Area:     8.39 cm LA Vol (A2C): 102.0 ml 53.17 ml/m  RA Volume:   16.60 ml 8.65 ml/m  AORTIC VALVE                      PULMONIC VALVE AV Area (Vmax):    1.56 cm      PV Vmax:        0.90 m/s AV Area (Vmean):   0.99 cm      PV Peak grad:   3.2 mmHg AV Area (VTI):     0.97 cm      RVOT Peak grad: 3 mmHg AV Vmax:           154.20 cm/s AV Vmean:          157.000 cm/s AV VTI:            0.507 m AV Peak Grad:      9.5 mmHg AV Mean Grad:      11.0 mmHg LVOT Vmax:         84.90 cm/s LVOT Vmean:        54.700 cm/s LVOT VTI:          0.174 m LVOT/AV VTI ratio: 0.34  AORTA Ao Root diam: 2.50 cm Ao Asc diam:  3.10 cm MITRAL VALVE MV Area (PHT): 4.21 cm     SHUNTS MV Decel Time: 180 msec     Systemic VTI:  0.17 m MV E velocity: 123.00 cm/s  Systemic Diam: 1.90 cm MV A velocity: 124.00 cm/s MV E/A ratio:  0.99 Yoshi Vicencio D Clydine Parkison MD Electronically signed by Alwyn Pea MD Signature Date/Time: 08/12/2022/12:48:34 PM    Final    DG Chest 2 View  Result Date: 08/11/2022 CLINICAL DATA:  Shortness of breath EXAM: CHEST - 2 VIEW COMPARISON:  Chest x-ray 05/27/2022 FINDINGS: Central there are minimal hazy and interstitial opacities in the left lung base. Costophrenic angles are clear. No pneumothorax. Cardiomediastinal silhouette is within normal limits. No fractures are identified. IMPRESSION: Minimal hazy and interstitial opacities in the left lung base, atelectasis versus infection. Electronically Signed   By: Darliss Cheney M.D.   On: 08/11/2022 15:07    EKG: Normal sinus rhythm nonspecific ST-T wave changes rate of 75  ASSESSMENT AND PLAN:  Acute on chronic diastolic congestive heart failure  Hypertension Hyponatremia Hyperlipidemia Diabetes type 2 Chronic renal sufficiency stage IIIa Obesity Possible obstructive sleep apnea . Plan Agree with admit rule out microinfarction follow-up troponins EKGs Supplemental oxygen as necessary diuresis with Lasix inhalers Agree with echocardiogram for reassessment of left ventricular function wall motion Continue fluid restriction because of obesity and lower extremity  edema Sleep study for evaluation obstructive sleep apnea Continue hypertension management and control beta-blocker therapy Ox Consider adding Marcelline Deist because of heart failure and diabetes Borderline troponins probably demand ischemia Continue aspirin and statin beta-blocker ACE inhibitor if renal function tolerates IV heparin for 48 to 72 hours Hyponatremia mild to consider fluid restriction Obesity recommend weight loss exercise portion control Recommend conservative cardiac input at this stage no clear indication for invasive procedure  Signed: Alwyn Pea MD 08/12/2022, 1:51 PM

## 2022-08-12 NOTE — Progress Notes (Signed)
PROGRESS NOTE    Jodi Chavez  VHQ:[id number] DOB: 06/15/1950 DOA: 08/11/2022 PCP: Fleet Contras, MD   Assessment & Plan:   Principal Problem:   Acute on chronic diastolic CHF (congestive heart failure) (HCC) Active Problems:   Myocardial injury   Essential hypertension   Hyponatremia   HLD (hyperlipidemia)   Type II diabetes mellitus with renal manifestations (HCC)   Chronic kidney disease, stage 3a (HCC)   Leukocytosis   Obesity (BMI 30-39.9)  Assessment and Plan: Acute on chronic diastolic CHF: echo 10/07/2019 showed EF 50-55%.  Has a 2+ leg edema, positive JVD, elevated BNP 1096, clinically consistent with CHF exacerbation. Continue on IV lasix. Monitor I/Os and daily weights. Echo ordered. Cardio consulted    Myocardial injury: trop 162 --> 234.  Has central chest discomfort, likely due to demand ischemia, but cannot completely rule out a non-STEMI. Continue on IV heparin drip. Continue on tele. Continue on aspirin, statin. Cardio consulted    HTN: continue on amlodipine, metoprolol, clonidine. Holding benazepril, HCTZ  Hyponatremia: improving daily. Holding HCTZ, benazepril    HLD: continue on statin, zetia    DM2: well controlled, HbA1c 6.4. Continue on SSI w/ accuchecks   CKDIIIa: baseline Cr 1.12-1.25 recently. Cr is trending down slightly from day prior   Leukocytosis: likely reactive   Obesity: BMI 32.7. Would benefit from weight loss        DVT prophylaxis: IV heparin  Code Status: full  Family Communication:  Disposition Plan: depends on PT/OT recs   Level of care: Telemetry Cardiac Status is: Inpatient Remains inpatient appropriate because: severity of illness   Consultants:  Cardio   Procedures:   Antimicrobials:  Subjective: Pt c/o b/l LE swelling   Objective: Vitals:   08/12/22 0400 08/12/22 0500 08/12/22 0700 08/12/22 0726  BP: 120/67 (!) 108/58 (!) 108/59   Pulse: 69 62 62   Resp: 18 20 19    Temp:    97.7 F (36.5 C)   TempSrc:    Oral  SpO2: 94% 94% 95%   Weight:      Height:        Intake/Output Summary (Last 24 hours) at 08/12/2022 0848 Last data filed at 08/11/2022 1748 Gross per 24 hour  Intake --  Output 350 ml  Net -350 ml   Filed Weights   08/11/22 1418  Weight: 86.6 kg    Examination:  General exam: Appears calm and comfortable  Respiratory system: diminished breath sounds b/l  Cardiovascular system: S1 & S2 +. No rubs, gallops or clicks. 2+ b/l LE pitting edema  Gastrointestinal system: Abdomen is obese, soft and nontender. Normal bowel sounds heard. Central nervous system: Alert and oriented. Psychiatry: Judgement and insight appear normal. Mood & affect appropriate.     Data Reviewed: I have personally reviewed following labs and imaging studies  CBC: Recent Labs  Lab 08/11/22 1430  WBC 13.9*  HGB 14.2  HCT 42.2  MCV 96.3  PLT 233   Basic Metabolic Panel: Recent Labs  Lab 08/11/22 1430 08/11/22 2204 08/12/22 0214  NA 129* 130* 132*  K 4.5 4.5 4.6  CL 91* 91* 93*  CO2 25 28 30   GLUCOSE 150* 131* 118*  BUN 24* 24* 23  CREATININE 1.38* 1.41* 1.34*  CALCIUM 9.1 9.5 9.4  MG  --  1.7 1.7   GFR: Estimated Creatinine Clearance: 41.6 mL/min (A) (by C-G formula based on SCr of 1.34 mg/dL (H)). Liver Function Tests: Recent Labs  Lab 08/11/22 1430  AST  29  ALT 17  ALKPHOS 64  BILITOT 0.6  PROT 7.0  ALBUMIN 4.1   No results for input(s): "LIPASE", "AMYLASE" in the last 168 hours. No results for input(s): "AMMONIA" in the last 168 hours. Coagulation Profile: Recent Labs  Lab 08/11/22 1649  INR 1.0   Cardiac Enzymes: No results for input(s): "CKTOTAL", "CKMB", "CKMBINDEX", "TROPONINI" in the last 168 hours. BNP (last 3 results) No results for input(s): "PROBNP" in the last 8760 hours. HbA1C: Recent Labs    08/11/22 2204  HGBA1C 6.4*   CBG: Recent Labs  Lab 08/11/22 2211 08/12/22 0759  GLUCAP 134* 154*   Lipid Profile: Recent Labs     08/11/22 1703  CHOL 102  HDL 32*  LDLCALC 33  TRIG 161*  CHOLHDL 3.2   Thyroid Function Tests: No results for input(s): "TSH", "T4TOTAL", "FREET4", "T3FREE", "THYROIDAB" in the last 72 hours. Anemia Panel: No results for input(s): "VITAMINB12", "FOLATE", "FERRITIN", "TIBC", "IRON", "RETICCTPCT" in the last 72 hours. Sepsis Labs: Recent Labs  Lab 08/11/22 1430  PROCALCITON <0.10    Recent Results (from the past 240 hour(s))  SARS Coronavirus 2 by RT PCR (hospital order, performed in [facility] hospital lab) *cepheid single result test* Anterior Nasal Swab     Status: None   Collection Time: 08/11/22  2:45 PM   Specimen: Anterior Nasal Swab  Result Value Ref Range Status   SARS Coronavirus 2 by RT PCR NEGATIVE NEGATIVE Final    Comment: (NOTE) SARS-CoV-2 target nucleic acids are NOT DETECTED.  The SARS-CoV-2 RNA is generally detectable in upper and lower respiratory specimens during the acute phase of infection. The lowest concentration of SARS-CoV-2 viral copies this assay can detect is 250 copies / mL. A negative result does not preclude SARS-CoV-2 infection and should not be used as the sole basis for treatment or other patient management decisions.  A negative result may occur with improper specimen collection / handling, submission of specimen other than nasopharyngeal swab, presence of viral mutation(s) within the areas targeted by this assay, and inadequate number of viral copies (<250 copies / mL). A negative result must be combined with clinical observations, patient history, and epidemiological information.  Fact Sheet for Patients:   RoadLapTop.co.za  Fact Sheet for Healthcare Providers: http://kim-miller.com/  This test is not yet approved or  cleared by the [place] FDA and has been authorized for detection and/or diagnosis of SARS-CoV-2 by FDA under an Emergency Use Authorization (EUA).  This EUA will  remain in effect (meaning this test can be used) for the duration of the COVID-19 declaration under Section 564(b)(1) of the Act, 21 U.S.C. section 360bbb-3(b)(1), unless the authorization is terminated or revoked sooner.  Performed at [facility], [address] [place], [place] [address]          Radiology Studies: DG Chest 2 View  Result Date: 08/11/2022 CLINICAL DATA:  Shortness of breath EXAM: CHEST - 2 VIEW COMPARISON:  Chest x-ray 05/27/2022 FINDINGS: Central there are minimal hazy and interstitial opacities in the left lung base. Costophrenic angles are clear. No pneumothorax. Cardiomediastinal silhouette is within normal limits. No fractures are identified. IMPRESSION: Minimal hazy and interstitial opacities in the left lung base, atelectasis versus infection. Electronically Signed   By: Darliss Cheney M.D.   On: 08/11/2022 15:07        Scheduled Meds:  amLODipine  5 mg Oral Daily   aspirin EC  81 mg Oral Daily   calcium-vitamin D  1 tablet  Oral Daily   cloNIDine  0.1 mg Oral QHS   ezetimibe  10 mg Oral QHS   gabapentin  300 mg Oral TID   insulin aspart  0-5 Units Subcutaneous QHS   insulin aspart  0-9 Units Subcutaneous TID WC   metoprolol succinate  50 mg Oral Daily   multivitamin with minerals  1 tablet Oral QPC breakfast   pantoprazole  40 mg Oral Daily   simvastatin  40 mg Oral QPM   sodium chloride  1 g Oral BID WC   Continuous Infusions:  heparin 1,100 Units/hr (08/12/22 0200)     LOS: 1 day    Time spent: 35 mins     Charise Killian, MD [facility] Pager [phone]  If 7PM-7AM, please contact night-coverage [email or web address] 08/12/2022, 8:48 AM

## 2022-08-12 NOTE — Progress Notes (Signed)
ANTICOAGULATION CONSULT NOTE  Pharmacy Consult for IV heparin Indication: chest pain/ACS  Allergies  Allergen Reactions   Naproxen Sodium Rash    anaprox    Patient Measurements: Height: 5\' 4"  (162.6 cm) Weight: 86.6 kg (191 lb) IBW/kg (Calculated) : 54.7 Heparin Dosing Weight: 73.9 kg  Vital Signs: Temp: 97.6 F (36.4 C) (06/15 2217) Temp Source: Oral (06/15 2217) BP: 154/83 (06/15 2200) Pulse Rate: 68 (06/15 2200)  Labs: Recent Labs    08/11/22 1430 08/11/22 1649 08/11/22 2204 08/12/22 0117  HGB 14.2  --   --   --   HCT 42.2  --   --   --   PLT 233  --   --   --   APTT  --  31  --   --   LABPROT  --  13.3  --   --   INR  --  1.0  --   --   HEPARINUNFRC  --   --   --  0.16*  CREATININE 1.38*  --  1.41*  --   TROPONINIHS 162* 234* 465*  --      Estimated Creatinine Clearance: 39.6 mL/min (A) (by C-G formula based on SCr of 1.41 mg/dL (H)).   Medical History: Past Medical History:  Diagnosis Date   Arthritis    hip   CHF (congestive heart failure) (HCC)    Constipation    uses laxatives several times a week   Depression    Diabetes mellitus    takes Metformin and Amaryl daily   Dizziness    occasionally and related to meds    Early cataracts, bilateral    History of bronchitis    last time several yrs ago   History of migraine    many yrs ago   Hyperlipidemia    takes Zetia and Zocor daily   Hypertension    takes Benazepril nightly and Propranolol tid and Clonidine daily   Insomnia    Low back pain    Peripheral edema    Peripheral neuropathy    PONV (postoperative nausea and vomiting)    Slow urinary stream    occasionally    Medications:  No anticoagulation prior to admission per my chart review  Assessment: 72 year old female presenting with chest pain.  Initial troponins 162. PT-INR and aPTT in process  Goal of Therapy:  Heparin level 0.3-0.7 units/ml Monitor platelets by anticoagulation protocol: Yes   06/16 0117 HL 0.16,  subtherapeutic  Plan: Bolus 2200 units x 1 Increase heparin infusion to 1100 units/hr Recheck HL in 8 hrs after rate change CBC daily while on heparin   Otelia Sergeant, PharmD, [facility] 08/12/2022 1:49 AM

## 2022-08-12 NOTE — Progress Notes (Signed)
ANTICOAGULATION CONSULT NOTE  Pharmacy Consult for IV heparin Indication: chest pain/ACS  Allergies  Allergen Reactions   Naproxen Sodium Rash    anaprox    Patient Measurements: Height: 5\' 4"  (162.6 cm) Weight: 86.6 kg (191 lb) IBW/kg (Calculated) : 54.7 Heparin Dosing Weight: 73.9 kg  Vital Signs: Temp: 97.9 F (36.6 C) (06/16 2000) Temp Source: Oral (06/16 2000) BP: 102/89 (06/16 2000) Pulse Rate: 82 (06/16 2000)  Labs: Recent Labs    08/11/22 1430 08/11/22 1649 08/11/22 2204 08/12/22 0117 08/12/22 0214 08/12/22 1040 08/12/22 1259 08/12/22 2002  HGB 14.2  --   --   --   --  12.1  --   --   HCT 42.2  --   --   --   --  36.3  --   --   PLT 233  --   --   --   --  201  --   --   APTT  --  31  --   --   --   --   --   --   LABPROT  --  13.3  --   --   --   --   --   --   INR  --  1.0  --   --   --   --   --   --   HEPARINUNFRC  --   --   --  0.16*  --  0.30  --  0.26*  CREATININE 1.38*  --  1.41*  --  1.34*  --  1.34*  --   TROPONINIHS 162* 234* 465* 627* 636*  --   --   --      Estimated Creatinine Clearance: 41.6 mL/min (A) (by C-G formula based on SCr of 1.34 mg/dL (H)).   Medical History: Past Medical History:  Diagnosis Date   Arthritis    hip   CHF (congestive heart failure) (HCC)    Constipation    uses laxatives several times a week   Depression    Diabetes mellitus    takes Metformin and Amaryl daily   Dizziness    occasionally and related to meds    Early cataracts, bilateral    History of bronchitis    last time several yrs ago   History of migraine    many yrs ago   Hyperlipidemia    takes Zetia and Zocor daily   Hypertension    takes Benazepril nightly and Propranolol tid and Clonidine daily   Insomnia    Low back pain    Peripheral edema    Peripheral neuropathy    PONV (postoperative nausea and vomiting)    Slow urinary stream    occasionally    Medications:  No anticoagulation prior to admission per my chart  review  Assessment: 72 year old female presenting with chest pain.  Initial troponins 162. PT-INR and aPTT in process  Goal of Therapy:  Heparin level 0.3-0.7 units/ml Monitor platelets by anticoagulation protocol: Yes   06/16 0117 HL 0.16, subtherapeutic 06/16 1040 HL 0.30  therapeutic x1  06/16 2002 HL 0.26, subtherapeutic  Plan: 06/16 2002 HL 0.26 slighlty subtherapeutic Heparin 1100 units bolus and increase heparin infusion to 1300 units/hr check  HL in 8 hrs after rate change CBC daily while on heparin   Clovia Cuff, PharmD, BCPS 08/12/2022 9:05 PM

## 2022-08-13 ENCOUNTER — Other Ambulatory Visit (HOSPITAL_COMMUNITY): Payer: Self-pay

## 2022-08-13 DIAGNOSIS — I5033 Acute on chronic diastolic (congestive) heart failure: Secondary | ICD-10-CM | POA: Diagnosis not present

## 2022-08-13 LAB — HEPARIN LEVEL (UNFRACTIONATED)
Heparin Unfractionated: 0.42 IU/mL (ref 0.30–0.70)
Heparin Unfractionated: 0.42 IU/mL (ref 0.30–0.70)

## 2022-08-13 LAB — BASIC METABOLIC PANEL
Anion gap: 10 (ref 5–15)
BUN: 28 mg/dL — ABNORMAL HIGH (ref 8–23)
CO2: 28 mmol/L (ref 22–32)
Calcium: 8.9 mg/dL (ref 8.9–10.3)
Chloride: 92 mmol/L — ABNORMAL LOW (ref 98–111)
Creatinine, Ser: 1.39 mg/dL — ABNORMAL HIGH (ref 0.44–1.00)
GFR, Estimated: 41 mL/min — ABNORMAL LOW (ref >60)
Glucose, Bld: 182 mg/dL — ABNORMAL HIGH (ref 70–99)
Potassium: 4.5 mmol/L (ref 3.5–5.1)
Sodium: 130 mmol/L — ABNORMAL LOW (ref 135–145)

## 2022-08-13 LAB — CBC
HCT: 34.5 % — ABNORMAL LOW (ref 36.0–46.0)
Hemoglobin: 11.4 g/dL — ABNORMAL LOW (ref 12.0–15.0)
MCH: 32.5 pg (ref 26.0–34.0)
MCHC: 33 g/dL (ref 30.0–36.0)
MCV: 98.3 fL (ref 80.0–100.0)
Platelets: 193 10*3/uL (ref 150–400)
RBC: 3.51 MIL/uL — ABNORMAL LOW (ref 3.87–5.11)
RDW: 12.9 % (ref 11.5–15.5)
WBC: 10.4 10*3/uL (ref 4.0–10.5)
nRBC: 0 % (ref 0.0–0.2)

## 2022-08-13 LAB — GLUCOSE, CAPILLARY
Glucose-Capillary: 140 mg/dL — ABNORMAL HIGH (ref 70–99)
Glucose-Capillary: 135 mg/dL — ABNORMAL HIGH (ref 70–99)
Glucose-Capillary: 140 mg/dL — ABNORMAL HIGH (ref 70–99)
Glucose-Capillary: 179 mg/dL — ABNORMAL HIGH (ref 70–99)

## 2022-08-13 MED ORDER — FUROSEMIDE 10 MG/ML IJ SOLN
60.0000 mg | Freq: Once | INTRAMUSCULAR | Status: AC
  Administered 2022-08-13: 60 mg via INTRAVENOUS
  Filled 2022-08-13: qty 6

## 2022-08-13 MED ORDER — ALUM & MAG HYDROXIDE-SIMETH 200-200-20 MG/5ML PO SUSP
30.0000 mL | ORAL | Status: DC | PRN
  Administered 2022-08-14: 30 mL via ORAL
  Filled 2022-08-13: qty 30

## 2022-08-13 MED ORDER — ONDANSETRON HCL 4 MG/2ML IJ SOLN
4.0000 mg | Freq: Four times a day (QID) | INTRAMUSCULAR | Status: DC | PRN
  Administered 2022-08-13 – 2022-08-17 (×8): 4 mg via INTRAVENOUS
  Filled 2022-08-13 (×8): qty 2

## 2022-08-13 NOTE — Progress Notes (Addendum)
PROGRESS NOTE    Jodi Chavez  WUJ:[id number] DOB: [birth year] DOA: 08/11/2022 PCP: Fleet Contras, MD   Assessment & Plan:   Principal Problem:   Acute on chronic diastolic CHF (congestive heart failure) (HCC) Active Problems:   Myocardial injury   Essential hypertension   Hyponatremia   HLD (hyperlipidemia)   Type II diabetes mellitus with renal manifestations (HCC)   Chronic kidney disease, stage 3a (HCC)   Leukocytosis   Obesity (BMI 30-39.9)  Assessment and Plan: Acute on chronic diastolic CHF: echo 10/07/2019 showed EF 50-55%.  Has a 2+ leg edema, positive JVD, elevated BNP 1096, clinically consistent with CHF exacerbation. Continue on IV lasix. Monitor I/Os and daily weights. Echo shows EF 50-55%, LV has regional wall motion abnormalities, diastolic function was normal & mod AS. Cardio following and recs apprec    Myocardial injury: trop 162 --> 234.  Has central chest discomfort, likely due to demand ischemia, r/o NSTEMI as per cardio. Continue on IV heparin & can d/c tomorrow morning as per cardio. Continue on aspirin, statin. Continue on tele. Cardio following and recs apprec    HTN: continue on metoprolol, amlodipine, clonidine. Holding hydrochlorothiazide, benazepril   Hyponatremia: stable. Holding HCTZ   HLD: continue on zetia, statin     DM2: HbA1c 6.4, well controlled. Continue on SSI w/ accuchecks   CKDIIIa: baseline Cr 1.12-1.25 recently. Cr is trending up from day prior. Holding ACE-I   Leukocytosis: resolved    Obesity: BMI 32.7. Would benefit from weight loss         DVT prophylaxis: IV heparin  Code Status: full  Family Communication:  Disposition Plan: depends on PT/OT recs   Level of care: Telemetry Cardiac Status is: Inpatient Remains inpatient appropriate because: severity of illness   Consultants:  Cardio   Procedures:   Antimicrobials:  Subjective: Pt c/o nausea   Objective: Vitals:   08/12/22 2000 08/13/22 0100 08/13/22  0500 08/13/22 0506  BP: 102/89 124/60  98/71  Pulse: 82 81  76  Resp:      Temp: 97.9 F (36.6 C) 98.2 F (36.8 C)  98.2 F (36.8 C)  TempSrc: Oral Oral  Oral  SpO2: 91% 94%  94%  Weight:   90.5 kg   Height:        Intake/Output Summary (Last 24 hours) at 08/13/2022 0721 Last data filed at 08/13/2022 0600 Gross per 24 hour  Intake 445.9 ml  Output --  Net 445.9 ml   Filed Weights   08/11/22 1418 08/13/22 0500  Weight: 86.6 kg 90.5 kg    Examination:  General exam: appears uncomfortable  Respiratory system: decreased breath sounds b/l  Cardiovascular system: S1/S2+. No rubs or clicks. 2+ pitting edema of b/l LE  Gastrointestinal system: Abd is soft, NT, obese & hypoactive bowel sounds  Central nervous system: Alert and oriented Psychiatry: Judgement and insight appears at baseline. Flat mood and affect    Data Reviewed: I have personally reviewed following labs and imaging studies  CBC: Recent Labs  Lab 08/11/22 1430 08/12/22 1040 08/13/22 0352  WBC 13.9* 9.9 10.4  HGB 14.2 12.1 11.4*  HCT 42.2 36.3 34.5*  MCV 96.3 97.6 98.3  PLT 233 201 193   Basic Metabolic Panel: Recent Labs  Lab 08/11/22 1430 08/11/22 2204 08/12/22 0214 08/12/22 1259 08/12/22 2130  NA 129* 130* 132* 132* 132*  K 4.5 4.5 4.6 4.7 4.7  CL 91* 91* 93* 95* 95*  CO2 25 28 30 29 28   GLUCOSE  150* 131* 118* 144* 176*  BUN 24* 24* 23 25* 26*  CREATININE 1.38* 1.41* 1.34* 1.34* 1.61*  CALCIUM 9.1 9.5 9.4 9.0 9.1  MG  --  1.7 1.7  --   --    GFR: Estimated Creatinine Clearance: 35.4 mL/min (A) (by C-G formula based on SCr of 1.61 mg/dL (H)). Liver Function Tests: Recent Labs  Lab 08/11/22 1430  AST 29  ALT 17  ALKPHOS 64  BILITOT 0.6  PROT 7.0  ALBUMIN 4.1   No results for input(s): "LIPASE", "AMYLASE" in the last 168 hours. No results for input(s): "AMMONIA" in the last 168 hours. Coagulation Profile: Recent Labs  Lab 08/11/22 1649  INR 1.0   Cardiac Enzymes: No results  for input(s): "CKTOTAL", "CKMB", "CKMBINDEX", "TROPONINI" in the last 168 hours. BNP (last 3 results) No results for input(s): "PROBNP" in the last 8760 hours. HbA1C: Recent Labs    08/11/22 2204  HGBA1C 6.4*   CBG: Recent Labs  Lab 08/11/22 2211 08/12/22 0759 08/12/22 1143 08/12/22 1651 08/12/22 2046  GLUCAP 134* 154* 170* 131* 151*   Lipid Profile: Recent Labs    08/11/22 1703  CHOL 102  HDL 32*  LDLCALC 33  TRIG 161*  CHOLHDL 3.2   Thyroid Function Tests: No results for input(s): "TSH", "T4TOTAL", "FREET4", "T3FREE", "THYROIDAB" in the last 72 hours. Anemia Panel: No results for input(s): "VITAMINB12", "FOLATE", "FERRITIN", "TIBC", "IRON", "RETICCTPCT" in the last 72 hours. Sepsis Labs: Recent Labs  Lab 08/11/22 1430  PROCALCITON <0.10    Recent Results (from the past 240 hour(s))  SARS Coronavirus 2 by RT PCR (hospital order, performed in [facility] hospital lab) *cepheid single result test* Anterior Nasal Swab     Status: None   Collection Time: 08/11/22  2:45 PM   Specimen: Anterior Nasal Swab  Result Value Ref Range Status   SARS Coronavirus 2 by RT PCR NEGATIVE NEGATIVE Final    Comment: (NOTE) SARS-CoV-2 target nucleic acids are NOT DETECTED.  The SARS-CoV-2 RNA is generally detectable in upper and lower respiratory specimens during the acute phase of infection. The lowest concentration of SARS-CoV-2 viral copies this assay can detect is 250 copies / mL. A negative result does not preclude SARS-CoV-2 infection and should not be used as the sole basis for treatment or other patient management decisions.  A negative result may occur with improper specimen collection / handling, submission of specimen other than nasopharyngeal swab, presence of viral mutation(s) within the areas targeted by this assay, and inadequate number of viral copies (<250 copies / mL). A negative result must be combined with clinical observations, patient history, and  epidemiological information.  Fact Sheet for Patients:   RoadLapTop.co.za  Fact Sheet for Healthcare Providers: http://kim-miller.com/  This test is not yet approved or  cleared by the [place] FDA and has been authorized for detection and/or diagnosis of SARS-CoV-2 by FDA under an Emergency Use Authorization (EUA).  This EUA will remain in effect (meaning this test can be used) for the duration of the COVID-19 declaration under Section 564(b)(1) of the Act, 21 U.S.C. section 360bbb-3(b)(1), unless the authorization is terminated or revoked sooner.  Performed at [facility], [address], [place], [place] [address]          Radiology Studies: ECHOCARDIOGRAM COMPLETE  Result Date: 08/12/2022    ECHOCARDIOGRAM REPORT   Patient Name:   MALITA FUNES Date of Exam: 08/12/2022 Medical Rec #:  [id number]      Height:  64.0 in Accession #:    [id number]     Weight:       191.0 lb Date of Birth:  Jan 07, 1951      BSA:          1.918 m Patient Age:    71 years       BP:           108/59 mmHg Patient Gender: F              HR:           79 bpm. Exam Location:  [facility] Procedure: 2D Echo and Intracardiac Opacification Agent Indications:     CHF I50.31  History:         Patient has prior history of Echocardiogram examinations, most                  recent 10/08/2019.  Sonographer:     Overton Mam RDCS, FASE Referring Phys:  Wynona Neat NIU Diagnosing Phys: Alwyn Pea MD  Sonographer Comments: Technically difficult study due to poor echo windows, suboptimal apical window, no subcostal window and patient is obese. Image acquisition challenging due to COPD and Image acquisition challenging due to respiratory motion. IMPRESSIONS  1. Left ventricular ejection fraction, by estimation, is 50 to 55%. The left ventricle has low normal function. The left ventricle demonstrates regional wall motion abnormalities (see scoring diagram/findings  for description). Left ventricular diastolic  parameters were normal.  2. Right ventricular systolic function is normal. The right ventricular size is normal.  3. The mitral valve is normal in structure. Trivial mitral valve regurgitation.  4. The aortic valve is grossly normal. Aortic valve regurgitation is not visualized. Moderate aortic valve stenosis. FINDINGS  Left Ventricle: Apical septal hypokinesis. Left ventricular ejection fraction, by estimation, is 50 to 55%. The left ventricle has low normal function. The left ventricle demonstrates regional wall motion abnormalities. Definity contrast agent was given  IV to delineate the left ventricular endocardial borders. The left ventricular internal cavity size was normal in size. There is borderline left ventricular hypertrophy. Left ventricular diastolic parameters were normal. Right Ventricle: The right ventricular size is normal. No increase in right ventricular wall thickness. Right ventricular systolic function is normal. Left Atrium: Left atrial size was normal in size. Right Atrium: Right atrial size was normal in size. Pericardium: There is no evidence of pericardial effusion. Mitral Valve: The mitral valve is normal in structure. Trivial mitral valve regurgitation. Tricuspid Valve: The tricuspid valve is normal in structure. Tricuspid valve regurgitation is mild. Aortic Valve: The aortic valve is grossly normal. Aortic valve regurgitation is not visualized. Moderate aortic stenosis is present. Aortic valve mean gradient measures 11.0 mmHg. Aortic valve peak gradient measures 9.5 mmHg. Aortic valve area, by VTI measures 0.97 cm. Pulmonic Valve: The pulmonic valve was normal in structure. Pulmonic valve regurgitation is not visualized. Aorta: The ascending aorta was not well visualized. IAS/Shunts: No atrial level shunt detected by color flow Doppler.  LEFT VENTRICLE PLAX 2D LVIDd:         4.60 cm      Diastology LVIDs:         3.30 cm      LV e' medial:     9.68 cm/s LV PW:         1.10 cm      LV E/e' medial:  12.7 LV IVS:        0.95 cm      LV e' lateral:  7.07 cm/s LVOT diam:     1.90 cm      LV E/e' lateral: 17.4 LV SV:         49 LV SV Index:   26 LVOT Area:     2.84 cm  LV Volumes (MOD) LV vol d, MOD A2C: 112.0 ml LV vol d, MOD A4C: 85.5 ml LV vol s, MOD A2C: 44.1 ml LV vol s, MOD A4C: 37.3 ml LV SV MOD A2C:     67.9 ml LV SV MOD A4C:     85.5 ml LV SV MOD BP:      54.8 ml RIGHT VENTRICLE RV Basal diam:  2.80 cm RV S prime:     14.10 cm/s LEFT ATRIUM            Index        RIGHT ATRIUM          Index LA diam:      3.90 cm  2.03 cm/m   RA Area:     8.39 cm LA Vol (A2C): 102.0 ml 53.17 ml/m  RA Volume:   16.60 ml 8.65 ml/m  AORTIC VALVE                     PULMONIC VALVE AV Area (Vmax):    1.56 cm      PV Vmax:        0.90 m/s AV Area (Vmean):   0.99 cm      PV Peak grad:   3.2 mmHg AV Area (VTI):     0.97 cm      RVOT Peak grad: 3 mmHg AV Vmax:           154.20 cm/s AV Vmean:          157.000 cm/s AV VTI:            0.507 m AV Peak Grad:      9.5 mmHg AV Mean Grad:      11.0 mmHg LVOT Vmax:         84.90 cm/s LVOT Vmean:        54.700 cm/s LVOT VTI:          0.174 m LVOT/AV VTI ratio: 0.34  AORTA Ao Root diam: 2.50 cm Ao Asc diam:  3.10 cm MITRAL VALVE MV Area (PHT): 4.21 cm     SHUNTS MV Decel Time: 180 msec     Systemic VTI:  0.17 m MV E velocity: 123.00 cm/s  Systemic Diam: 1.90 cm MV A velocity: 124.00 cm/s MV E/A ratio:  0.99 Dwayne D Callwood MD Electronically signed by Alwyn Pea MD Signature Date/Time: 08/12/2022/12:48:34 PM    Final    DG Chest 2 View  Result Date: 08/11/2022 CLINICAL DATA:  Shortness of breath EXAM: CHEST - 2 VIEW COMPARISON:  Chest x-ray 05/27/2022 FINDINGS: Central there are minimal hazy and interstitial opacities in the left lung base. Costophrenic angles are clear. No pneumothorax. Cardiomediastinal silhouette is within normal limits. No fractures are identified. IMPRESSION: Minimal hazy and interstitial  opacities in the left lung base, atelectasis versus infection. Electronically Signed   By: Darliss Cheney M.D.   On: 08/11/2022 15:07        Scheduled Meds:  amLODipine  5 mg Oral Daily   aspirin EC  81 mg Oral Daily   calcium-vitamin D  1 tablet Oral Daily   cloNIDine  0.1 mg Oral QHS   ezetimibe  10 mg Oral QHS  gabapentin  300 mg Oral TID   insulin aspart  0-5 Units Subcutaneous QHS   insulin aspart  0-9 Units Subcutaneous TID WC   metoprolol succinate  50 mg Oral Daily   multivitamin with minerals  1 tablet Oral QPC breakfast   pantoprazole  40 mg Oral Daily   simvastatin  40 mg Oral QPM   sodium chloride  1 g Oral BID WC   Continuous Infusions:  heparin 1,300 Units/hr (08/13/22 0600)     LOS: 2 days    Time spent: 35 mins     Charise Killian, MD [facility] Pager [phone]  If 7PM-7AM, please contact night-coverage [email or web address] 08/13/2022, 7:21 AM

## 2022-08-13 NOTE — TOC CM/SW Note (Signed)
Transition of Care [facility]) - Inpatient Brief Assessment   Patient Details  Name: Jodi Chavez MRN: [id number] Date of Birth: May 14, 1950  Transition of [facility]) CM/SW Contact:    Margarito Liner, LCSW Phone Number: 08/13/2022, 11:26 AM   Clinical Narrative: CSW reviewed chart. She is currently on acute oxygen. No other TOC needs identified so far. CSW will continue to follow progress.  Transition of Care Asessment: Insurance and Status: Insurance coverage has been reviewed Patient has primary care physician: Yes Home environment has been reviewed: Single family home Prior level of function:: No recent falls per H&P Prior/Current Home Services: No current home services Social Determinants of Health Reivew: SDOH reviewed no interventions necessary Readmission risk has been reviewed: Yes Transition of care needs: no transition of care needs at this time

## 2022-08-13 NOTE — Progress Notes (Signed)
ANTICOAGULATION CONSULT NOTE  Pharmacy Consult for IV heparin Indication: chest pain/ACS  Allergies  Allergen Reactions   Naproxen Sodium Rash    anaprox    Patient Measurements: Height: 5\' 4"  (162.6 cm) Weight: 90.5 kg (199 lb 8.3 oz) IBW/kg (Calculated) : 54.7 Heparin Dosing Weight: 73.9 kg  Vital Signs: Temp: 98.5 F (36.9 C) (06/17 0807) Temp Source: Oral (06/17 0807) BP: 141/76 (06/17 0807) Pulse Rate: 84 (06/17 0807)  Labs: Recent Labs    08/11/22 1430 08/11/22 1649 08/11/22 2204 08/11/22 2204 08/12/22 0117 08/12/22 0214 08/12/22 1040 08/12/22 1259 08/12/22 2002 08/12/22 2130 08/13/22 0352 08/13/22 1029  HGB 14.2  --   --   --   --   --  12.1  --   --   --  11.4*  --   HCT 42.2  --   --   --   --   --  36.3  --   --   --  34.5*  --   PLT 233  --   --   --   --   --  201  --   --   --  193  --   APTT  --  31  --   --   --   --   --   --   --   --   --   --   LABPROT  --  13.3  --   --   --   --   --   --   --   --   --   --   INR  --  1.0  --   --   --   --   --   --   --   --   --   --   HEPARINUNFRC  --   --   --    < > 0.16*  --  0.30  --  0.26*  --  0.42 0.42  CREATININE 1.38*  --  1.41*  --   --  1.34*  --  1.34*  --  1.61*  --   --   TROPONINIHS 162* 234* 465*  --  627* 636*  --   --   --   --   --   --    < > = values in this interval not displayed.     Estimated Creatinine Clearance: 35.4 mL/min (A) (by C-G formula based on SCr of 1.61 mg/dL (H)).   Medical History: Past Medical History:  Diagnosis Date   Arthritis    hip   CHF (congestive heart failure) (HCC)    Constipation    uses laxatives several times a week   Depression    Diabetes mellitus    takes Metformin and Amaryl daily   Dizziness    occasionally and related to meds    Early cataracts, bilateral    History of bronchitis    last time several yrs ago   History of migraine    many yrs ago   Hyperlipidemia    takes Zetia and Zocor daily   Hypertension    takes  Benazepril nightly and Propranolol tid and Clonidine daily   Insomnia    Low back pain    Peripheral edema    Peripheral neuropathy    PONV (postoperative nausea and vomiting)    Slow urinary stream    occasionally    Medications:  No anticoagulation prior to admission per my chart review  Assessment: 72 year old Chavez presenting with chest pain.  Initial troponins 162. PT-INR and aPTT in process  Goal of Therapy:  Heparin level 0.3-0.7 units/ml Monitor platelets by anticoagulation protocol: Yes   06/16 0117 HL 0.16, subtherapeutic 06/16 1040 HL 0.30  therapeutic x1  06/16 2002 HL 0.26, subtherapeutic] 06/17 0352 HL 0.42, therapeutic X 1  06/17 1029 HL 0.42  Plan: Heparin level is therapeutic. Will continue heparin infusion at  1300 units/hr. Recheck heparin level and CBC with AM labs.    Ronnald Ramp, PharmD, BCPS  08/13/2022 11:29 AM

## 2022-08-13 NOTE — TOC Benefit Eligibility Note (Signed)
Pharmacy Patient Advocate Encounter  Insurance verification completed.    The patient is insured through [employer] test claim for [employer] and the current 30 day co-pay is $4.60. Pt has met deductible   This test claim was processed through [facility]- copay amounts may vary at other pharmacies due to [employer], or as the patient moves through the different stages of their insurance plan.

## 2022-08-13 NOTE — Progress Notes (Signed)
ANTICOAGULATION CONSULT NOTE  Pharmacy Consult for IV heparin Indication: chest pain/ACS  Allergies  Allergen Reactions   Naproxen Sodium Rash    anaprox    Patient Measurements: Height: 5\' 4"  (162.6 cm) Weight: 86.6 kg (191 lb) IBW/kg (Calculated) : 54.7 Heparin Dosing Weight: 73.9 kg  Vital Signs: Temp: 98.2 F (36.8 C) (06/17 0100) Temp Source: Oral (06/17 0100) BP: 124/60 (06/17 0100) Pulse Rate: 81 (06/17 0100)  Labs: Recent Labs    08/11/22 1430 08/11/22 1649 08/11/22 2204 08/11/22 2204 08/12/22 0117 08/12/22 0214 08/12/22 1040 08/12/22 1259 08/12/22 2002 08/12/22 2130 08/13/22 0352  HGB 14.2  --   --   --   --   --  12.1  --   --   --  11.4*  HCT 42.2  --   --   --   --   --  36.3  --   --   --  34.5*  PLT 233  --   --   --   --   --  201  --   --   --  193  APTT  --  31  --   --   --   --   --   --   --   --   --   LABPROT  --  13.3  --   --   --   --   --   --   --   --   --   INR  --  1.0  --   --   --   --   --   --   --   --   --   HEPARINUNFRC  --   --   --    < > 0.16*  --  0.30  --  0.26*  --  0.42  CREATININE 1.38*  --  1.41*  --   --  1.34*  --  1.34*  --  1.61*  --   TROPONINIHS 162* 234* 465*  --  627* 636*  --   --   --   --   --    < > = values in this interval not displayed.     Estimated Creatinine Clearance: 34.6 mL/min (A) (by C-G formula based on SCr of 1.61 mg/dL (H)).   Medical History: Past Medical History:  Diagnosis Date   Arthritis    hip   CHF (congestive heart failure) (HCC)    Constipation    uses laxatives several times a week   Depression    Diabetes mellitus    takes Metformin and Amaryl daily   Dizziness    occasionally and related to meds    Early cataracts, bilateral    History of bronchitis    last time several yrs ago   History of migraine    many yrs ago   Hyperlipidemia    takes Zetia and Zocor daily   Hypertension    takes Benazepril nightly and Propranolol tid and Clonidine daily   Insomnia     Low back pain    Peripheral edema    Peripheral neuropathy    PONV (postoperative nausea and vomiting)    Slow urinary stream    occasionally    Medications:  No anticoagulation prior to admission per my chart review  Assessment: 72 year old female presenting with chest pain.  Initial troponins 162. PT-INR and aPTT in process  Goal of Therapy:  Heparin level 0.3-0.7 units/ml Monitor  platelets by anticoagulation protocol: Yes   06/16 0117 HL 0.16, subtherapeutic 06/16 1040 HL 0.30  therapeutic x1  06/16 2002 HL 0.26, subtherapeutic] 06/17 0352 HL 0.42, therapeutic X 1   Plan: 6/17:  HL @ 0352 = 0.42, therapeutic X 1 - Will continue pt on current rate and recheck HL in 6 hrs on 6/17 @ 1000.  CBC daily while on heparin  Precilla Purnell D 08/13/2022 5:02 AM

## 2022-08-13 NOTE — Progress Notes (Signed)
[facility] CLINIC CARDIOLOGY PROGRESS NOTE   Patient ID: Jodi Chavez MRN: [id number] DOB/AGE: June 10, 1950 72 y.o.  Admit date: 08/11/2022 Referring Physician Dr. Lorretta Harp  Primary Physician Dr. Fleet Contras Primary Cardiologist Marijo Conception, NP Reason for Consultation AoCHF  HPI: Jodi Chavez is a 72 y.o. female with a past medical history of diastolic congestive heart failure, COVID-related myocarditis, chronic renal sufficiency stage IIIa, hypertension, hyperlipidemia, diabetes, obesity, diabetic neuropathy  who presented to the ED on 08/11/2022 for shortness of breath. Cardiology consulted for further assistance.  Interval History:  -Patient reports she is feeling better this AM, states SOB has improved. Remains on 2L [place]. Denies CP.  -Did not receive any Lasix yesterday, net positive since admission.  -Slight bump in Cr late yesterday to 1.61 from 1.34 yesterday morning.   Review of systems complete and found to be negative unless listed above    Vitals:   08/13/22 0100 08/13/22 0500 08/13/22 0506 08/13/22 0807  BP: 124/60  98/71 (!) 141/76  Pulse: 81  76 84  Resp:    16  Temp: 98.2 F (36.8 C)  98.2 F (36.8 C) 98.5 F (36.9 C)  TempSrc: Oral  Oral Oral  SpO2: 94%  94% 96%  Weight:  90.5 kg    Height:         Intake/Output Summary (Last 24 hours) at 08/13/2022 1105 Last data filed at 08/13/2022 0600 Gross per 24 hour  Intake 445.9 ml  Output --  Net 445.9 ml     PHYSICAL EXAM General: Elderly female, well nourished, in no acute distress sitting upright in hospital bed eating breakfast. HEENT: Normocephalic and atraumatic. Neck: No JVD.  Lungs: Normal respiratory effort on 2L. Clear bilaterally to auscultation. No wheezes, rhonchi. +crackles R lung base  Heart: HRRR. Normal S1 and S2 without gallops or murmurs. Radial & DP pulses 2+ bilaterally. Abdomen: Non-distended appearing.  Msk: Normal strength and tone for age. Extremities: No clubbing, cyanosis. 2+  pitting edema bilaterally   Neuro: Alert and oriented X 3. Psych: Mood appropriate, affect congruent.    LABS: Basic Metabolic Panel: Recent Labs    08/11/22 2204 08/12/22 0214 08/12/22 1259 08/12/22 2130  NA 130* 132* 132* 132*  K 4.5 4.6 4.7 4.7  CL 91* 93* 95* 95*  CO2 28 30 29 28   GLUCOSE 131* 118* 144* 176*  BUN 24* 23 25* 26*  CREATININE 1.41* 1.34* 1.34* 1.61*  CALCIUM 9.5 9.4 9.0 9.1  MG 1.7 1.7  --   --    Liver Function Tests: Recent Labs    08/11/22 1430  AST 29  ALT 17  ALKPHOS 64  BILITOT 0.6  PROT 7.0  ALBUMIN 4.1   No results for input(s): "LIPASE", "AMYLASE" in the last 72 hours. CBC: Recent Labs    08/12/22 1040 08/13/22 0352  WBC 9.9 10.4  HGB 12.1 11.4*  HCT 36.3 34.5*  MCV 97.6 98.3  PLT 201 193   Cardiac Enzymes: Recent Labs    08/11/22 2204 08/12/22 0117 08/12/22 0214  TROPONINIHS 465* 627* 636*   BNP: Recent Labs    08/11/22 1430  BNP 1,096.1*   D-Dimer: No results for input(s): "DDIMER" in the last 72 hours. Hemoglobin A1C: Recent Labs    08/11/22 2204  HGBA1C 6.4*   Fasting Lipid Panel: Recent Labs    08/11/22 1703  CHOL 102  HDL 32*  LDLCALC 33  TRIG 409*  CHOLHDL 3.2   Thyroid Function Tests: No results for input(s): "  TSH", "T4TOTAL", "T3FREE", "THYROIDAB" in the last 72 hours.  Invalid input(s): "FREET3" Anemia Panel: No results for input(s): "VITAMINB12", "FOLATE", "FERRITIN", "TIBC", "IRON", "RETICCTPCT" in the last 72 hours.  ECHOCARDIOGRAM COMPLETE  Result Date: 08/12/2022    ECHOCARDIOGRAM REPORT   Patient Name:   Jodi Chavez Date of Exam: 08/12/2022 Medical Rec #:  [id number]      Height:       64.0 in Accession #:    [id number]     Weight:       191.0 lb Date of Birth:  1950-07-23      BSA:          1.918 m Patient Age:    72 years       BP:           108/59 mmHg Patient Gender: F              HR:           79 bpm. Exam Location:  [facility] Procedure: 2D Echo and Intracardiac Opacification Agent  Indications:     CHF I50.31  History:         Patient has prior history of Echocardiogram examinations, most                  recent 10/08/2019.  Sonographer:     Overton Mam RDCS, FASE Referring Phys:  Wynona Neat NIU Diagnosing Phys: Alwyn Pea MD  Sonographer Comments: Technically difficult study due to poor echo windows, suboptimal apical window, no subcostal window and patient is obese. Image acquisition challenging due to COPD and Image acquisition challenging due to respiratory motion. IMPRESSIONS  1. Left ventricular ejection fraction, by estimation, is 50 to 55%. The left ventricle has low normal function. The left ventricle demonstrates regional wall motion abnormalities (see scoring diagram/findings for description). Left ventricular diastolic  parameters were normal.  2. Right ventricular systolic function is normal. The right ventricular size is normal.  3. The mitral valve is normal in structure. Trivial mitral valve regurgitation.  4. The aortic valve is grossly normal. Aortic valve regurgitation is not visualized. Moderate aortic valve stenosis. FINDINGS  Left Ventricle: Apical septal hypokinesis. Left ventricular ejection fraction, by estimation, is 50 to 55%. The left ventricle has low normal function. The left ventricle demonstrates regional wall motion abnormalities. Definity contrast agent was given  IV to delineate the left ventricular endocardial borders. The left ventricular internal cavity size was normal in size. There is borderline left ventricular hypertrophy. Left ventricular diastolic parameters were normal. Right Ventricle: The right ventricular size is normal. No increase in right ventricular wall thickness. Right ventricular systolic function is normal. Left Atrium: Left atrial size was normal in size. Right Atrium: Right atrial size was normal in size. Pericardium: There is no evidence of pericardial effusion. Mitral Valve: The mitral valve is normal in structure. Trivial  mitral valve regurgitation. Tricuspid Valve: The tricuspid valve is normal in structure. Tricuspid valve regurgitation is mild. Aortic Valve: The aortic valve is grossly normal. Aortic valve regurgitation is not visualized. Moderate aortic stenosis is present. Aortic valve mean gradient measures 11.0 mmHg. Aortic valve peak gradient measures 9.5 mmHg. Aortic valve area, by VTI measures 0.97 cm. Pulmonic Valve: The pulmonic valve was normal in structure. Pulmonic valve regurgitation is not visualized. Aorta: The ascending aorta was not well visualized. IAS/Shunts: No atrial level shunt detected by color flow Doppler.  LEFT VENTRICLE PLAX 2D LVIDd:         4.60  cm      Diastology LVIDs:         3.30 cm      LV e' medial:    9.68 cm/s LV PW:         1.10 cm      LV E/e' medial:  12.7 LV IVS:        0.95 cm      LV e' lateral:   7.07 cm/s LVOT diam:     1.90 cm      LV E/e' lateral: 17.4 LV SV:         49 LV SV Index:   26 LVOT Area:     2.84 cm  LV Volumes (MOD) LV vol d, MOD A2C: 112.0 ml LV vol d, MOD A4C: 85.5 ml LV vol s, MOD A2C: 44.1 ml LV vol s, MOD A4C: 37.3 ml LV SV MOD A2C:     67.9 ml LV SV MOD A4C:     85.5 ml LV SV MOD BP:      54.8 ml RIGHT VENTRICLE RV Basal diam:  2.80 cm RV S prime:     14.10 cm/s LEFT ATRIUM            Index        RIGHT ATRIUM          Index LA diam:      3.90 cm  2.03 cm/m   RA Area:     8.39 cm LA Vol (A2C): 102.0 ml 53.17 ml/m  RA Volume:   16.60 ml 8.65 ml/m  AORTIC VALVE                     PULMONIC VALVE AV Area (Vmax):    1.56 cm      PV Vmax:        0.90 m/s AV Area (Vmean):   0.99 cm      PV Peak grad:   3.2 mmHg AV Area (VTI):     0.97 cm      RVOT Peak grad: 3 mmHg AV Vmax:           154.20 cm/s AV Vmean:          157.000 cm/s AV VTI:            0.507 m AV Peak Grad:      9.5 mmHg AV Mean Grad:      11.0 mmHg LVOT Vmax:         84.90 cm/s LVOT Vmean:        54.700 cm/s LVOT VTI:          0.174 m LVOT/AV VTI ratio: 0.34  AORTA Ao Root diam: 2.50 cm Ao Asc diam:   3.10 cm MITRAL VALVE MV Area (PHT): 4.21 cm     SHUNTS MV Decel Time: 180 msec     Systemic VTI:  0.17 m MV E velocity: 123.00 cm/s  Systemic Diam: 1.90 cm MV A velocity: 124.00 cm/s MV E/A ratio:  0.99 Dwayne D Callwood MD Electronically signed by Alwyn Pea MD Signature Date/Time: 08/12/2022/12:48:34 PM    Final    DG Chest 2 View  Result Date: 08/11/2022 CLINICAL DATA:  Shortness of breath EXAM: CHEST - 2 VIEW COMPARISON:  Chest x-ray 05/27/2022 FINDINGS: Central there are minimal hazy and interstitial opacities in the left lung base. Costophrenic angles are clear. No pneumothorax. Cardiomediastinal silhouette is within normal limits. No fractures are identified. IMPRESSION: Minimal hazy and interstitial opacities  in the left lung base, atelectasis versus infection. Electronically Signed   By: Darliss Cheney M.D.   On: 08/11/2022 15:07     ECHO 08/12/2022:  1. Left ventricular ejection fraction, by estimation, is 50 to 55%. The left ventricle has low normal function. The left ventricle demonstrates regional wall motion abnormalities (see scoring diagram/findings for description). Left ventricular diastolic parameters were normal.   2. Right ventricular systolic function is normal. The right ventricular size is normal.   3. The mitral valve is normal in structure. Trivial mitral valve regurgitation.   4. The aortic valve is grossly normal. Aortic valve regurgitation is not visualized. Moderate aortic valve stenosis.   TELEMETRY reviewed by me 08/13/22: NSR rate 80s  EKG reviewed by me 08/13/22: NSR bigeminy  DATA reviewed by me 08/13/22: admission H&P, hospitalist progress note, last 24h vitals tele labs imaging I/O   ASSESSMENT AND PLAN: Jodi Chavez is a 72 y.o. female with a past medical history of diastolic congestive heart failure, COVID-related myocarditis, chronic renal sufficiency stage IIIa, hypertension, hyperlipidemia, diabetes, obesity, diabetic neuropathy  who presented to  the ED on 08/11/2022 for shortness of breath. Cardiology consulted for further assistance.  # Acute on Chronic HFpEF # Hypertension Patient presented with worsening SOB and LE edema. Currently on 2L [place] - no oxygen requirement at baseline. Net positive since admission.  -S/p IV Lasix 60 mg x1 on 6/15.  -IV lasix 60 mg ordered for today, will assess response.  -Echo reviewed -Continue Amlodipine, Metoprolol, clonidine. Home hydrochlorothiazide and benazapril currently held given hyponatremia.   # Demand Ischemia # Hyperlipidemia Patient with central chest discomfort on admission, trops trended 162 (6/15) > 234 > 465 > 627 > 636 (6/16) -Patient denies chest pain this AM.  -Continue heparin infusion x 48-72 hours. Plan to discontinue tomorrow morning.  -No plan for additional cardiac diagnostics.  -Continue simvastatin and zetia, cholesterol well controlled on lipid panel.   # Chronic Kidney Disease Stage IIIa # Hyponatremia Cr 1.61 yesterday evening,   -Monitor renal function closely.  -Continue holding hydrochlorothiazide, benazepril.  -Consider farxiga for diastolic HF, diabetes pending improvement of renal function.    Principal Problem:   Acute on chronic diastolic CHF (congestive heart failure) (HCC) Active Problems:   Essential hypertension   HLD (hyperlipidemia)   Myocardial injury   Type II diabetes mellitus with renal manifestations (HCC)   Chronic kidney disease, stage 3a (HCC)   Leukocytosis   Hyponatremia   Obesity (BMI 30-39.9)    This patient's case was discussed and created with Dr. Juliann Pares and he is in agreement.  Jayro Mcmath [place], PA-C 08/13/2022, 11:05 AM [facility]

## 2022-08-14 ENCOUNTER — Other Ambulatory Visit (HOSPITAL_COMMUNITY): Payer: Self-pay

## 2022-08-14 ENCOUNTER — Inpatient Hospital Stay: Payer: Medicare Other

## 2022-08-14 DIAGNOSIS — I5033 Acute on chronic diastolic (congestive) heart failure: Secondary | ICD-10-CM | POA: Diagnosis not present

## 2022-08-14 LAB — BASIC METABOLIC PANEL
Anion gap: 9 (ref 5–15)
BUN: 26 mg/dL — ABNORMAL HIGH (ref 8–23)
CO2: 28 mmol/L (ref 22–32)
Calcium: 9 mg/dL (ref 8.9–10.3)
Chloride: 92 mmol/L — ABNORMAL LOW (ref 98–111)
Creatinine, Ser: 1.34 mg/dL — ABNORMAL HIGH (ref 0.44–1.00)
GFR, Estimated: 43 mL/min — ABNORMAL LOW (ref >60)
Glucose, Bld: 130 mg/dL — ABNORMAL HIGH (ref 70–99)
Potassium: 4.5 mmol/L (ref 3.5–5.1)
Sodium: 129 mmol/L — ABNORMAL LOW (ref 135–145)

## 2022-08-14 LAB — CBC
HCT: 33.1 % — ABNORMAL LOW (ref 36.0–46.0)
Hemoglobin: 11.4 g/dL — ABNORMAL LOW (ref 12.0–15.0)
MCH: 33.4 pg (ref 26.0–34.0)
MCHC: 34.4 g/dL (ref 30.0–36.0)
MCV: 97.1 fL (ref 80.0–100.0)
Platelets: 214 10*3/uL (ref 150–400)
RBC: 3.41 MIL/uL — ABNORMAL LOW (ref 3.87–5.11)
RDW: 12.9 % (ref 11.5–15.5)
WBC: 11.7 10*3/uL — ABNORMAL HIGH (ref 4.0–10.5)
nRBC: 0 % (ref 0.0–0.2)

## 2022-08-14 LAB — GLUCOSE, CAPILLARY
Glucose-Capillary: 151 mg/dL — ABNORMAL HIGH (ref 70–99)
Glucose-Capillary: 133 mg/dL — ABNORMAL HIGH (ref 70–99)
Glucose-Capillary: 130 mg/dL — ABNORMAL HIGH (ref 70–99)
Glucose-Capillary: 137 mg/dL — ABNORMAL HIGH (ref 70–99)

## 2022-08-14 LAB — HEPARIN LEVEL (UNFRACTIONATED): Heparin Unfractionated: 0.37 IU/mL (ref 0.30–0.70)

## 2022-08-14 LAB — MAGNESIUM: Magnesium: 1.5 mg/dL — ABNORMAL LOW (ref 1.7–2.4)

## 2022-08-14 MED ORDER — MAGNESIUM SULFATE 2 GM/50ML IV SOLN
2.0000 g | Freq: Once | INTRAVENOUS | Status: AC
  Administered 2022-08-14: 2 g via INTRAVENOUS
  Filled 2022-08-14: qty 50

## 2022-08-14 MED ORDER — SODIUM CHLORIDE 0.9 % IV SOLN
12.5000 mg | Freq: Four times a day (QID) | INTRAVENOUS | Status: DC | PRN
  Administered 2022-08-14 – 2022-08-17 (×5): 12.5 mg via INTRAVENOUS
  Filled 2022-08-14 (×5): qty 12.5

## 2022-08-14 MED ORDER — FUROSEMIDE 10 MG/ML IJ SOLN
40.0000 mg | Freq: Two times a day (BID) | INTRAMUSCULAR | Status: AC
  Administered 2022-08-14 (×2): 40 mg via INTRAVENOUS
  Filled 2022-08-14 (×2): qty 4

## 2022-08-14 MED ORDER — ENOXAPARIN SODIUM 40 MG/0.4ML IJ SOSY
40.0000 mg | PREFILLED_SYRINGE | INTRAMUSCULAR | Status: DC
  Administered 2022-08-14 – 2022-08-16 (×3): 40 mg via SUBCUTANEOUS
  Filled 2022-08-14 (×3): qty 0.4

## 2022-08-14 MED ORDER — FUROSEMIDE 10 MG/ML IJ SOLN: 40.0000 mg | Freq: Two times a day (BID) | INTRAMUSCULAR | Status: DC

## 2022-08-14 MED ORDER — EMPAGLIFLOZIN 10 MG PO TABS
10.0000 mg | ORAL_TABLET | Freq: Every day | ORAL | Status: DC
  Administered 2022-08-14 – 2022-08-17 (×4): 10 mg via ORAL
  Filled 2022-08-14 (×4): qty 1

## 2022-08-14 MED ORDER — BISACODYL 10 MG RE SUPP
10.0000 mg | Freq: Once | RECTAL | Status: AC
  Administered 2022-08-14: 10 mg via RECTAL
  Filled 2022-08-14: qty 1

## 2022-08-14 NOTE — Progress Notes (Signed)
ANTICOAGULATION CONSULT NOTE  Pharmacy Consult for IV heparin Indication: chest pain/ACS  Allergies  Allergen Reactions   Naproxen Sodium Rash    anaprox    Patient Measurements: Height: 5\' 4"  (162.6 cm) Weight: 87.7 kg (193 lb 5.5 oz) IBW/kg (Calculated) : 54.7 Heparin Dosing Weight: 73.9 kg  Vital Signs: Temp: 98.4 F (36.9 C) (06/18 0419) BP: 129/65 (06/18 0419) Pulse Rate: 87 (06/18 0419)  Labs: Recent Labs    08/11/22 1649 08/11/22 2204 08/11/22 2204 08/12/22 0117 08/12/22 0214 08/12/22 1040 08/12/22 1259 08/12/22 2130 08/13/22 0352 08/13/22 1029 08/13/22 1322 08/14/22 0442  HGB  --   --   --   --   --  12.1  --   --  11.4*  --   --  11.4*  HCT  --   --   --   --   --  36.3  --   --  34.5*  --   --  33.1*  PLT  --   --   --   --   --  201  --   --  193  --   --  214  APTT 31  --   --   --   --   --   --   --   --   --   --   --   LABPROT 13.3  --   --   --   --   --   --   --   --   --   --   --   INR 1.0  --   --   --   --   --   --   --   --   --   --   --   HEPARINUNFRC  --   --    < > 0.16*  --  0.30   < >  --  0.42 0.42  --  0.37  CREATININE  --  1.41*  --   --  1.34*  --    < > 1.61*  --   --  1.39* 1.34*  TROPONINIHS 234* 465*  --  627* 636*  --   --   --   --   --   --   --    < > = values in this interval not displayed.     Estimated Creatinine Clearance: 41.9 mL/min (A) (by C-G formula based on SCr of 1.34 mg/dL (H)).   Medical History: Past Medical History:  Diagnosis Date   Arthritis    hip   CHF (congestive heart failure) (HCC)    Constipation    uses laxatives several times a week   Depression    Diabetes mellitus    takes Metformin and Amaryl daily   Dizziness    occasionally and related to meds    Early cataracts, bilateral    History of bronchitis    last time several yrs ago   History of migraine    many yrs ago   Hyperlipidemia    takes Zetia and Zocor daily   Hypertension    takes Benazepril nightly and Propranolol  tid and Clonidine daily   Insomnia    Low back pain    Peripheral edema    Peripheral neuropathy    PONV (postoperative nausea and vomiting)    Slow urinary stream    occasionally    Medications:  No anticoagulation prior to admission per my chart review  Assessment: 72 year old female presenting with chest pain.  Initial troponins 162. PT-INR and aPTT in process  Goal of Therapy:  Heparin level 0.3-0.7 units/ml Monitor platelets by anticoagulation protocol: Yes   06/16 0117 HL 0.16, subtherapeutic 06/16 1040 HL 0.30  therapeutic x1  06/16 2002 HL 0.26, subtherapeutic] 06/17 0352 HL 0.42, therapeutic X 1  06/17 1029 HL 0.42 06/18 0442 HL 0.37, therapeutic X 3  Plan: Heparin level is therapeutic. Will continue heparin infusion at  1300 units/hr. Recheck heparin level and CBC with AM labs.    Tomothy Eddins D, PharmD 08/14/2022 5:48 AM

## 2022-08-14 NOTE — Progress Notes (Addendum)
[facility] CLINIC CARDIOLOGY PROGRESS NOTE   Patient ID: Jodi Chavez MRN: [id number] DOB/AGE: 71-Apr-1952 71 y.o.  Admit date: 08/11/2022 Referring Physician Dr. Lorretta Harp  Primary Physician Dr. Fleet Contras Primary Cardiologist Marijo Conception, NP Reason for Consultation AoCHF  HPI: Jodi Chavez is a 72 y.o. female with a past medical history of diastolic congestive heart failure, COVID-related myocarditis, chronic renal sufficiency stage IIIa, hypertension, hyperlipidemia, diabetes, obesity, diabetic neuropathy  who presented to the ED on 08/11/2022 for shortness of breath. Cardiology consulted for further assistance.  Interval History:  -Patient states she is not feeling great this AM, endorses some nausea that is worse after eating. This first began yesterday.  -Denies SOB but remains on 2L [place] and LE edema without improvement. Renal function stable, diuresed well yesterday.  -Mild dry cough noted today.   Review of systems complete and found to be negative unless listed above    Vitals:   08/13/22 2331 08/14/22 0418 08/14/22 0419 08/14/22 0828  BP: 101/80  129/65 135/76  Pulse: 92  87 (!) 103  Resp: 18  18 (!) 22  Temp: (!) 97.4 F (36.3 C)  98.4 F (36.9 C) 98.8 F (37.1 C)  TempSrc:      SpO2: 93%  91% 91%  Weight:  87.7 kg    Height:         Intake/Output Summary (Last 24 hours) at 08/14/2022 0957 Last data filed at 08/13/2022 1600 Gross per 24 hour  Intake 114.73 ml  Output 1000 ml  Net -885.27 ml     PHYSICAL EXAM General: Elderly female, well nourished, in no acute distress laying at an incline in hospital bed. HEENT: Normocephalic and atraumatic. Neck: No JVD.  Lungs: Normal respiratory effort on 2L. Clear bilaterally to auscultation. No wheezes, rhonchi. +crackles R lung base  Heart: HRRR. Normal S1 and S2 without gallops or murmurs. Radial & DP pulses 2+ bilaterally. Abdomen: Non-distended appearing.  Msk: Normal strength and tone for age. Extremities:  No clubbing, cyanosis. 2+ pitting edema bilaterally   Neuro: Alert and oriented X 3. Psych: Mood appropriate, affect congruent.    LABS: Basic Metabolic Panel: Recent Labs    08/12/22 0214 08/12/22 1259 08/13/22 1322 08/14/22 0442  NA 132*   < > 130* 129*  K 4.6   < > 4.5 4.5  CL 93*   < > 92* 92*  CO2 30   < > 28 28  GLUCOSE 118*   < > 182* 130*  BUN 23   < > 28* 26*  CREATININE 1.34*   < > 1.39* 1.34*  CALCIUM 9.4   < > 8.9 9.0  MG 1.7  --   --  1.5*   < > = values in this interval not displayed.   Liver Function Tests: Recent Labs    08/11/22 1430  AST 29  ALT 17  ALKPHOS 64  BILITOT 0.6  PROT 7.0  ALBUMIN 4.1   No results for input(s): "LIPASE", "AMYLASE" in the last 72 hours. CBC: Recent Labs    08/13/22 0352 08/14/22 0442  WBC 10.4 11.7*  HGB 11.4* 11.4*  HCT 34.5* 33.1*  MCV 98.3 97.1  PLT 193 214   Cardiac Enzymes: Recent Labs    08/11/22 2204 08/12/22 0117 08/12/22 0214  TROPONINIHS 465* 627* 636*   BNP: Recent Labs    08/11/22 1430  BNP 1,096.1*   D-Dimer: No results for input(s): "DDIMER" in the last 72 hours. Hemoglobin A1C: Recent Labs  08/11/22 2204  HGBA1C 6.4*   Fasting Lipid Panel: Recent Labs    08/11/22 1703  CHOL 102  HDL 32*  LDLCALC 33  TRIG 161*  CHOLHDL 3.2   Thyroid Function Tests: No results for input(s): "TSH", "T4TOTAL", "T3FREE", "THYROIDAB" in the last 72 hours.  Invalid input(s): "FREET3" Anemia Panel: No results for input(s): "VITAMINB12", "FOLATE", "FERRITIN", "TIBC", "IRON", "RETICCTPCT" in the last 72 hours.  No results found.   ECHO 08/12/2022:  1. Left ventricular ejection fraction, by estimation, is 50 to 55%. The left ventricle has low normal function. The left ventricle demonstrates regional wall motion abnormalities (see scoring diagram/findings for description). Left ventricular diastolic parameters were normal.   2. Right ventricular systolic function is normal. The right ventricular  size is normal.   3. The mitral valve is normal in structure. Trivial mitral valve regurgitation.   4. The aortic valve is grossly normal. Aortic valve regurgitation is not visualized. Moderate aortic valve stenosis.   TELEMETRY reviewed by me 08/14/22: NSR rate 90-100s  EKG reviewed by me 08/14/22: NSR bigeminy  DATA reviewed by me 08/14/22: hospitalist progress note, last 24h vitals tele labs imaging I/O   ASSESSMENT AND PLAN: Jodi Chavez is a 72 y.o. female with a past medical history of diastolic congestive heart failure, COVID-related myocarditis, chronic renal sufficiency stage IIIa, hypertension, hyperlipidemia, diabetes, obesity, diabetic neuropathy  who presented to the ED on 08/11/2022 for shortness of breath. Cardiology consulted for further assistance.  # Nausea Patient complaining this AM of nausea worse after eating that began yesterday morning after breakfast. Denies emesis. -Zofran 4 mg q6 prn  -Management per primary.   # Acute on Chronic HFpEF # Hypertension Patient presented with worsening SOB and LE edema. Currently on 2L [place] - no oxygen requirement at baseline. Net negative 789 mL since admission. -S/p IV Lasix 60 mg x1 on 6/15, 6/17.  -IV lasix 40 mg bid today  -Repeat CXR ordered for additional evaluation -Echo reviewed -Continue Amlodipine, Metoprolol, clonidine. Home hydrochlorothiazide and benazapril currently held given hyponatremia.   # Demand Ischemia # Hyperlipidemia Patient with central chest discomfort on admission, trops trended 162 (6/15) > 234 > 465 > 627 > 636 (6/16) -Patient denies chest pain this AM.  -S/p heparin infusion x 64 hours. Discontinued today, patient to be put on lovenox for DVT ppx.  -No plan for additional cardiac diagnostics.  -Continue simvastatin and zetia, cholesterol well controlled on lipid panel.   # Chronic Kidney Disease Stage IIIa # Hyponatremia Cr 1.34 this AM from 1.39 yesterday -Monitor renal function closely.   -Continue holding hydrochlorothiazide, benazepril.  -Consider farxiga for diastolic HF, diabetes pending improvement of renal function.    Principal Problem:   Acute on chronic diastolic CHF (congestive heart failure) (HCC) Active Problems:   Essential hypertension   HLD (hyperlipidemia)   Myocardial injury   Type II diabetes mellitus with renal manifestations (HCC)   Chronic kidney disease, stage 3a (HCC)   Leukocytosis   Hyponatremia   Obesity (BMI 30-39.9)    This patient's case was discussed and created with Dr. Juliann Pares and he is in agreement.  Chaska Hagger [place], PA-C 08/14/2022, 9:57 AM [facility]

## 2022-08-14 NOTE — TOC Initial Note (Signed)
Transition of Care [facility]) - Initial/Assessment Note    Patient Details  Name: Jodi Chavez MRN: [id number] Date of Birth: 11/07/1950  Transition of Care [facility]) CM/SW Contact:    Margarito Liner, LCSW Phone Number: 08/14/2022, 4:03 PM  Clinical Narrative:  CSW met with patient. No supports at bedside. CSW introduced role and explained that PT recommendations would be discussed. Patient is agreeable to SNF placement. Will follow up once bed offers are available. No further concerns. CSW encouraged patient to contact CSW as needed. CSW will continue to follow patient for support and facilitate discharge to SNF once medically stable.                Expected Discharge Plan: Skilled Nursing Facility Barriers to Discharge: Continued Medical Work up   Patient Goals and CMS Choice   CMS Medicare.gov Compare Post Acute Care list provided to:: Patient        Expected Discharge Plan and Services     Post Acute Care Choice: Skilled Nursing Facility Living arrangements for the past 2 months: Single Family Home                                      Prior Living Arrangements/Services Living arrangements for the past 2 months: Single Family Home Lives with:: Siblings, Relatives Patient language and need for interpreter reviewed:: Yes Do you feel safe going back to the place where you live?: Yes      Need for Family Participation in Patient Care: Yes (Comment) Care giver support system in place?: Yes (comment)   Criminal Activity/Legal Involvement Pertinent to Current Situation/Hospitalization: No - Comment as needed  Activities of Daily Living Home Assistive Devices/Equipment: Dan Humphreys (specify type) ADL Screening (condition at time of admission) Patient's cognitive ability adequate to safely complete daily activities?: Yes Is the patient deaf or have difficulty hearing?: No Does the patient have difficulty seeing, even when wearing glasses/contacts?: No Does the patient have  difficulty concentrating, remembering, or making decisions?: No Patient able to express need for assistance with ADLs?: Yes Does the patient have difficulty dressing or bathing?: No Independently performs ADLs?: Yes (appropriate for developmental age) Does the patient have difficulty walking or climbing stairs?: Yes Weakness of Legs: Both Weakness of Arms/Hands: None  Permission Sought/Granted Permission sought to share information with : Facility Industrial/product designer granted to share information with : Yes, Verbal Permission Granted     Permission granted to share info w AGENCY: SNF's        Emotional Assessment Appearance:: Appears stated age Attitude/Demeanor/Rapport: Engaged, Gracious Affect (typically observed): Accepting, Appropriate, Calm, Pleasant Orientation: : Oriented to Place, Oriented to Self, Oriented to  Time, Oriented to Situation Alcohol / Substance Use: Not Applicable Psych Involvement: No (comment)  Admission diagnosis:  Acute respiratory failure with hypoxia (HCC) [J96.01] Acute on chronic diastolic CHF (congestive heart failure) (HCC) [I50.33] Acute on chronic congestive heart failure, unspecified heart failure type [facility]) [I50.9] Patient Active Problem List   Diagnosis Date Noted   Acute on chronic diastolic CHF (congestive heart failure) (HCC) 08/11/2022   Myocardial injury 08/11/2022   Type II diabetes mellitus with renal manifestations (HCC) 08/11/2022   Chronic kidney disease, stage 3a (HCC) 08/11/2022   Leukocytosis 08/11/2022   Hyponatremia 08/11/2022   Obesity (BMI 30-39.9) 08/11/2022   Pneumonia 05/27/2022   Near syncope 05/27/2022   Acute respiratory failure with hypoxia (HCC) 05/27/2022   Bilateral  carotid artery disease (HCC) 08/08/2020   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   Posterior vitreous detachment of left eye 02/01/2020   Posterior subcapsular age-related cataract of right eye 02/01/2020   Posterior vitreous  detachment of right eye 02/01/2020   Nuclear sclerotic cataract of both eyes 02/01/2020   Posterior subcapsular polar age-related cataract of left eye 02/01/2020   Aortic atherosclerosis (HCC) 12/04/2019   Breakthrough COVID-19 10/07/2019   Elevated troponin 10/07/2019   Left knee pain 09/22/2015   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 06/23/2015   HLD (hyperlipidemia) 11/21/2014   Depression 11/21/2014   Nausea & vomiting 11/21/2014   Sepsis (HCC) 11/21/2014   Coughing 11/21/2014   UTI (lower urinary tract infection) 11/21/2014   CKD (chronic kidney disease), stage III (HCC) 11/21/2014   Nausea with vomiting    Cervical spondylosis with radiculopathy 10/15/2011    Class: Chronic   HNP (herniated nucleus pulposus), cervical 10/15/2011    Class: Chronic   Diabetes mellitus without complication (HCC) 09/30/2006   Essential hypertension 09/30/2006   DILATION AND CURETTAGE, HX OF 09/30/2006   PCP:  Fleet Contras, MD Pharmacy:   [facility] [address], [place] - [address] [place] [address] Phone: [phone] Fax: [phone]  [facility] [address] [place] - [address] [place] [address] Phone: [phone] Fax: [phone]     Social Determinants of Health (SDOH) Social History: SDOH Screenings   Food Insecurity: No Food Insecurity (08/12/2022)  Housing: Low Risk  (08/12/2022)  Transportation Needs: No Transportation Needs (08/12/2022)  Utilities: Not At Risk (08/12/2022)  Tobacco Use: Low Risk  (08/11/2022)   SDOH Interventions:     Readmission Risk Interventions     No data to display

## 2022-08-14 NOTE — [place] FL2 (Signed)
[place] MEDICAID FL2 LEVEL OF CARE FORM     IDENTIFICATION  Patient Name: Jodi Chavez Birthdate: 1950/10/15 Sex: female Admission Date (Current Location): 08/11/2022  [facility] and [place] Number:  Chiropodist and Address:  [facility], [address] [place], [place] [address]      Provider Number: [id number]  Attending Physician Name and Address:  Charise Killian, MD  Relative Name and Phone Number:       Current Level of Care: Hospital Recommended Level of Care: Skilled Nursing Facility Prior Approval Number:    Date Approved/Denied:   PASRR Number: [id number]  Discharge Plan: SNF    Current Diagnoses: Patient Active Problem List   Diagnosis Date Noted   Acute on chronic diastolic CHF (congestive heart failure) (HCC) 08/11/2022   Myocardial injury 08/11/2022   Type II diabetes mellitus with renal manifestations (HCC) 08/11/2022   Chronic kidney disease, stage 3a (HCC) 08/11/2022   Leukocytosis 08/11/2022   Hyponatremia 08/11/2022   Obesity (BMI 30-39.9) 08/11/2022   Pneumonia 05/27/2022   Near syncope 05/27/2022   Acute respiratory failure with hypoxia (HCC) 05/27/2022   Bilateral carotid artery disease (HCC) 08/08/2020   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   Posterior vitreous detachment of left eye 02/01/2020   Posterior subcapsular age-related cataract of right eye 02/01/2020   Posterior vitreous detachment of right eye 02/01/2020   Nuclear sclerotic cataract of both eyes 02/01/2020   Posterior subcapsular polar age-related cataract of left eye 02/01/2020   Aortic atherosclerosis (HCC) 12/04/2019   Breakthrough COVID-19 10/07/2019   Elevated troponin 10/07/2019   Left knee pain 09/22/2015   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 06/23/2015   HLD (hyperlipidemia) 11/21/2014   Depression 11/21/2014   Nausea & vomiting 11/21/2014   Sepsis (HCC) 11/21/2014   Coughing 11/21/2014   UTI  (lower urinary tract infection) 11/21/2014   CKD (chronic kidney disease), stage III (HCC) 11/21/2014   Nausea with vomiting    Cervical spondylosis with radiculopathy 10/15/2011   HNP (herniated nucleus pulposus), cervical 10/15/2011   Diabetes mellitus without complication (HCC) 09/30/2006   Essential hypertension 09/30/2006   DILATION AND CURETTAGE, HX OF 09/30/2006    Orientation RESPIRATION BLADDER Height & Weight     Self, Time, Situation, Place  O2 (Nasal Cannula 1 L) Continent Weight: 193 lb 5.5 oz (87.7 kg) Height:  5\' 4"  (162.6 cm)  BEHAVIORAL SYMPTOMS/MOOD NEUROLOGICAL BOWEL NUTRITION STATUS   (None)  (None) Continent Diet (Heart healthy/carb modified. Fluid restriction 1200 mL.)  AMBULATORY STATUS COMMUNICATION OF NEEDS Skin   Limited Assist Verbally Other (Comment) (Erythema/redness.)                       Personal Care Assistance Level of Assistance  Bathing, Feeding, Dressing Bathing Assistance: Limited assistance Feeding assistance: Independent Dressing Assistance: Limited assistance     Functional Limitations Info  Sight, Hearing, Speech Sight Info: Adequate Hearing Info: Adequate Speech Info: Adequate    SPECIAL CARE FACTORS FREQUENCY  PT (By licensed PT), OT (By licensed OT)     PT Frequency: 5 x week OT Frequency: 5 x week            Contractures Contractures Info: Not present    Additional Factors Info  Code Status, Allergies Code Status Info: Full code Allergies Info: Naproxen Sodium           Current Medications (08/14/2022):  This is the current hospital active medication list  Current Facility-Administered Medications  Medication Dose Route Frequency Provider Last Rate Last Admin   acetaminophen (TYLENOL) tablet 650 mg  650 mg Oral Q6H PRN Lorretta Harp, MD       albuterol (PROVENTIL) (2.5 MG/3ML) 0.083% nebulizer solution 2.5 mg  2.5 mg Inhalation Q4H PRN Lorretta Harp, MD       alum & mag hydroxide-simeth (MAALOX/MYLANTA) 200-200-20  MG/5ML suspension 30 mL  30 mL Oral Q4H PRN Charise Killian, MD   30 mL at 08/14/22 0611   amLODipine (NORVASC) tablet 5 mg  5 mg Oral Daily Lorretta Harp, MD   5 mg at 08/14/22 1610   aspirin EC tablet 81 mg  81 mg Oral Daily Lorretta Harp, MD   81 mg at 08/14/22 0912   calcium-vitamin D (OSCAL WITH D) 500-5 MG-MCG per tablet 1 tablet  1 tablet Oral Daily Lorretta Harp, MD   1 tablet at 08/14/22 0912   cloNIDine (CATAPRES) tablet 0.1 mg  0.1 mg Oral QHS Lorretta Harp, MD   0.1 mg at 08/13/22 2237   dextromethorphan-guaiFENesin (MUCINEX DM) 30-600 MG per 12 hr tablet 1 tablet  1 tablet Oral BID PRN Lorretta Harp, MD   1 tablet at 08/11/22 2227   empagliflozin (JARDIANCE) tablet 10 mg  10 mg Oral Daily Hudson, Caralyn, PA-C   10 mg at 08/14/22 1135   enoxaparin (LOVENOX) injection 40 mg  40 mg Subcutaneous Q24H Charise Killian, MD       ezetimibe (ZETIA) tablet 10 mg  10 mg Oral QHS Lorretta Harp, MD   10 mg at 08/13/22 2237   furosemide (LASIX) injection 40 mg  40 mg Intravenous BID Hudson, Caralyn, PA-C   40 mg at 08/14/22 1003   gabapentin (NEURONTIN) capsule 300 mg  300 mg Oral TID Lorretta Harp, MD   300 mg at 08/14/22 9604   hydrALAZINE (APRESOLINE) injection 5 mg  5 mg Intravenous Q2H PRN Lorretta Harp, MD       insulin aspart (novoLOG) injection 0-5 Units  0-5 Units Subcutaneous QHS Lorretta Harp, MD       insulin aspart (novoLOG) injection 0-9 Units  0-9 Units Subcutaneous TID WC Lorretta Harp, MD   1 Units at 08/14/22 1255   loratadine (CLARITIN) tablet 10 mg  10 mg Oral Daily PRN Lorretta Harp, MD   10 mg at 08/14/22 0929   metoprolol succinate (TOPROL-XL) 24 hr tablet 50 mg  50 mg Oral Daily Lorretta Harp, MD   50 mg at 08/14/22 5409   multivitamin with minerals tablet 1 tablet  1 tablet Oral QPC breakfast Lorretta Harp, MD   1 tablet at 08/14/22 0912   nitroGLYCERIN (NITROSTAT) SL tablet 0.4 mg  0.4 mg Sublingual Q5 min PRN Lorretta Harp, MD       ondansetron [facility]) injection 4 mg  4 mg Intravenous Q6H PRN Charise Killian, MD   4 mg at 08/14/22 0423   pantoprazole (PROTONIX) EC tablet 40 mg  40 mg Oral Daily Lorretta Harp, MD   40 mg at 08/14/22 0912   promethazine (PHENERGAN) 12.5 mg in sodium chloride 0.9 % 50 mL IVPB  12.5 mg Intravenous Q6H PRN Charise Killian, MD   Stopped at 08/14/22 1153   simvastatin (ZOCOR) tablet 40 mg  40 mg Oral QPM Lorretta Harp, MD   40 mg at 08/13/22 1632   sodium chloride tablet 1 g  1 g Oral BID WC Lorretta Harp, MD   1 g at 08/14/22 [phone]  Discharge Medications: Please see discharge summary for a list of discharge medications.  Relevant Imaging Results:  Relevant Lab Results:   Additional Information SS#: [id number]  Margarito Liner, LCSW

## 2022-08-14 NOTE — Progress Notes (Signed)
PROGRESS NOTE   HPI was taken from Dr. Clyde Lundborg: Jodi Chavez is a 72 y.o. 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   Patient states that she started having shortness of breath since this morning, which has been progressively worsening. She has worsening leg edema bilaterally and orthopnea.Patient has dry cough, no fever or chills.  Patient has central chest discomfort.  Patient has loose stool bowel movement, no nausea, vomiting or abdominal pain.  No symptoms of UTI.  Patient does not have recent fall or head injury.  No rectal bleeding or dark stool.   Data reviewed independently and ED Course: pt was found to have trop 162--> 234, BNP 1096, WBC 13.9, negative COVID PCR, sodium 129, renal function slightly worsening at baseline, temperature normal, blood pressure 132/102, heart rate 89, RR 30, 22, oxygen saturation 87% on room air, which improved to 97% on 2 L oxygen (patient is not wearing oxygen normally at home).  Patient is admitted to telemetry bed as inpatient.  Dr. Juliann Pares of cardiology is consulted.     EKG: I have personally reviewed.  Sinus rhythm, QTc 456, ventricular bigeminy, RAD.      Jodi Chavez  ZOX:[id number] DOB: 06/16/1950 DOA: 08/11/2022 PCP: Jodi Contras, MD   Assessment & Plan:   Principal Problem:   Acute on chronic diastolic CHF (congestive heart failure) (HCC) Active Problems:   Myocardial injury   Essential hypertension   Hyponatremia   HLD (hyperlipidemia)   Type II diabetes mellitus with renal manifestations (HCC)   Chronic kidney disease, stage 3a (HCC)   Leukocytosis   Obesity (BMI 30-39.9)  Assessment and Plan: Acute on chronic diastolic CHF: echo 10/07/2019 showed EF 50-55%.  Has a 2+ leg edema, positive JVD, elevated BNP 1096, clinically consistent with CHF exacerbation. Continue on  IV lasix & monitor I/Os. Echo shows EF 50-55%, LV has regional wall motion abnormalities, diastolic function was normal & mod AS. Cardio following and recs apprec    Myocardial injury: trop 162 --> 234.  Has central chest discomfort, likely due to demand ischemia, r/o NSTEMI as per cardio. D/c IV heparin today as per cardio. Continue on aspirin, statin. No plan for addition cardiac work-up as per cardio    HTN: continue on amlodipine, clonidine, metoprolol. Holding benazepril, HCTZ  Hyponatremia: trending down slightly today    HLD: continue on statin, zetia    DM2: well controlled, HbA1c 6.4. Continue on SSI w/ accuchecks   CKDIIIa: baseline Cr 1.12-1.25 recently. Holding ACE-I. Cr is trending down slightly today   Leukocytosis: labile  Obesity: BMI 32.7.Would benefit from weight loss       DVT prophylaxis: lovenox  Code Status: full  Family Communication:  Disposition Plan: depends on PT/OT recs   Level of care: Telemetry Cardiac Status is: Inpatient Remains inpatient appropriate because: severity of illness   Consultants:  Cardio   Procedures:   Antimicrobials:  Subjective: Pt still c/o nausea    Objective: Vitals:   08/13/22 2012 08/13/22 2331 08/14/22 0418 08/14/22 0419  BP: 135/64 101/80  129/65  Pulse: 89 92  87  Resp: 20 18  18   Temp: 97.9 F (36.6 C) (!) 97.4 F (36.3 C)  98.4 F (36.9 C)  TempSrc:      SpO2: 90% 93%  91%  Weight:   87.7 kg   Height:  Intake/Output Summary (Last 24 hours) at 08/14/2022 0823 Last data filed at 08/13/2022 1600 Gross per 24 hour  Intake 114.73 ml  Output 1000 ml  Net -885.27 ml   Filed Weights   08/11/22 1418 08/13/22 0500 08/14/22 0418  Weight: 86.6 kg 90.5 kg 87.7 kg    Examination:  General exam: appears calm but uncomfortable  Respiratory system: diminished breath sounds b/l  Cardiovascular system: S1 & S2+. 2+ pitting edema of b/l LE Gastrointestinal system: abd is soft, NT, obese & hypoactive  bowel sounds  Central nervous system: alert and awake  Psychiatry: Judgement and insight appears at baseline. Flat mood and affect     Data Reviewed: I have personally reviewed following labs and imaging studies  CBC: Recent Labs  Lab 08/11/22 1430 08/12/22 1040 08/13/22 0352 08/14/22 0442  WBC 13.9* 9.9 10.4 11.7*  HGB 14.2 12.1 11.4* 11.4*  HCT 42.2 36.3 34.5* 33.1*  MCV 96.3 97.6 98.3 97.1  PLT 233 201 193 214   Basic Metabolic Panel: Recent Labs  Lab 08/11/22 2204 08/12/22 0214 08/12/22 1259 08/12/22 2130 08/13/22 1322 08/14/22 0442  NA 130* 132* 132* 132* 130* 129*  K 4.5 4.6 4.7 4.7 4.5 4.5  CL 91* 93* 95* 95* 92* 92*  CO2 28 30 29 28 28 28   GLUCOSE 131* 118* 144* 176* 182* 130*  BUN 24* 23 25* 26* 28* 26*  CREATININE 1.41* 1.34* 1.34* 1.61* 1.39* 1.34*  CALCIUM 9.5 9.4 9.0 9.1 8.9 9.0  MG 1.7 1.7  --   --   --  1.5*   GFR: Estimated Creatinine Clearance: 41.9 mL/min (A) (by C-G formula based on SCr of 1.34 mg/dL (H)). Liver Function Tests: Recent Labs  Lab 08/11/22 1430  AST 29  ALT 17  ALKPHOS 64  BILITOT 0.6  PROT 7.0  ALBUMIN 4.1   No results for input(s): "LIPASE", "AMYLASE" in the last 168 hours. No results for input(s): "AMMONIA" in the last 168 hours. Coagulation Profile: Recent Labs  Lab 08/11/22 1649  INR 1.0   Cardiac Enzymes: No results for input(s): "CKTOTAL", "CKMB", "CKMBINDEX", "TROPONINI" in the last 168 hours. BNP (last 3 results) No results for input(s): "PROBNP" in the last 8760 hours. HbA1C: Recent Labs    08/11/22 2204  HGBA1C 6.4*   CBG: Recent Labs  Lab 08/12/22 2046 08/13/22 0806 08/13/22 1141 08/13/22 1542 08/13/22 2013  GLUCAP 151* 140* 135* 140* 179*   Lipid Profile: Recent Labs    08/11/22 1703  CHOL 102  HDL 32*  LDLCALC 33  TRIG 161*  CHOLHDL 3.2   Thyroid Function Tests: No results for input(s): "TSH", "T4TOTAL", "FREET4", "T3FREE", "THYROIDAB" in the last 72 hours. Anemia Panel: No  results for input(s): "VITAMINB12", "FOLATE", "FERRITIN", "TIBC", "IRON", "RETICCTPCT" in the last 72 hours. Sepsis Labs: Recent Labs  Lab 08/11/22 1430  PROCALCITON <0.10    Recent Results (from the past 240 hour(s))  SARS Coronavirus 2 by RT PCR (hospital order, performed in [facility] hospital lab) *cepheid single result test* Anterior Nasal Swab     Status: None   Collection Time: 08/11/22  2:45 PM   Specimen: Anterior Nasal Swab  Result Value Ref Range Status   SARS Coronavirus 2 by RT PCR NEGATIVE NEGATIVE Final    Comment: (NOTE) SARS-CoV-2 target nucleic acids are NOT DETECTED.  The SARS-CoV-2 RNA is generally detectable in upper and lower respiratory specimens during the acute phase of infection. The lowest concentration of SARS-CoV-2 viral copies this assay can detect  is 250 copies / mL. A negative result does not preclude SARS-CoV-2 infection and should not be used as the sole basis for treatment or other patient management decisions.  A negative result may occur with improper specimen collection / handling, submission of specimen other than nasopharyngeal swab, presence of viral mutation(s) within the areas targeted by this assay, and inadequate number of viral copies (<250 copies / mL). A negative result must be combined with clinical observations, patient history, and epidemiological information.  Fact Sheet for Patients:   RoadLapTop.co.za  Fact Sheet for Healthcare Providers: http://kim-miller.com/  This test is not yet approved or  cleared by the [place] FDA and has been authorized for detection and/or diagnosis of SARS-CoV-2 by FDA under an Emergency Use Authorization (EUA).  This EUA will remain in effect (meaning this test can be used) for the duration of the COVID-19 declaration under Section 564(b)(1) of the Act, 21 U.S.C. section 360bbb-3(b)(1), unless the authorization is terminated or revoked  sooner.  Performed at [facility], [address], [place], [place] [address]          Radiology Studies: ECHOCARDIOGRAM COMPLETE  Result Date: 08/12/2022    ECHOCARDIOGRAM REPORT   Patient Name:   BRETT KITCH Date of Exam: 08/12/2022 Medical Rec #:  [id number]      Height:       64.0 in Accession #:    [id number]     Weight:       191.0 lb Date of Birth:  April 29, 1950      BSA:          1.918 m Patient Age:    71 years       BP:           108/59 mmHg Patient Gender: F              HR:           79 bpm. Exam Location:  [facility] Procedure: 2D Echo and Intracardiac Opacification Agent Indications:     CHF I50.31  History:         Patient has prior history of Echocardiogram examinations, most                  recent 10/08/2019.  Sonographer:     Overton Mam RDCS, FASE Referring Phys:  Wynona Neat NIU Diagnosing Phys: Alwyn Pea MD  Sonographer Comments: Technically difficult study due to poor echo windows, suboptimal apical window, no subcostal window and patient is obese. Image acquisition challenging due to COPD and Image acquisition challenging due to respiratory motion. IMPRESSIONS  1. Left ventricular ejection fraction, by estimation, is 50 to 55%. The left ventricle has low normal function. The left ventricle demonstrates regional wall motion abnormalities (see scoring diagram/findings for description). Left ventricular diastolic  parameters were normal.  2. Right ventricular systolic function is normal. The right ventricular size is normal.  3. The mitral valve is normal in structure. Trivial mitral valve regurgitation.  4. The aortic valve is grossly normal. Aortic valve regurgitation is not visualized. Moderate aortic valve stenosis. FINDINGS  Left Ventricle: Apical septal hypokinesis. Left ventricular ejection fraction, by estimation, is 50 to 55%. The left ventricle has low normal function. The left ventricle demonstrates regional wall motion abnormalities. Definity contrast  agent was given  IV to delineate the left ventricular endocardial borders. The left ventricular internal cavity size was normal in size. There is borderline left ventricular hypertrophy. Left ventricular diastolic parameters were normal. Right  Ventricle: The right ventricular size is normal. No increase in right ventricular wall thickness. Right ventricular systolic function is normal. Left Atrium: Left atrial size was normal in size. Right Atrium: Right atrial size was normal in size. Pericardium: There is no evidence of pericardial effusion. Mitral Valve: The mitral valve is normal in structure. Trivial mitral valve regurgitation. Tricuspid Valve: The tricuspid valve is normal in structure. Tricuspid valve regurgitation is mild. Aortic Valve: The aortic valve is grossly normal. Aortic valve regurgitation is not visualized. Moderate aortic stenosis is present. Aortic valve mean gradient measures 11.0 mmHg. Aortic valve peak gradient measures 9.5 mmHg. Aortic valve area, by VTI measures 0.97 cm. Pulmonic Valve: The pulmonic valve was normal in structure. Pulmonic valve regurgitation is not visualized. Aorta: The ascending aorta was not well visualized. IAS/Shunts: No atrial level shunt detected by color flow Doppler.  LEFT VENTRICLE PLAX 2D LVIDd:         4.60 cm      Diastology LVIDs:         3.30 cm      LV e' medial:    9.68 cm/s LV PW:         1.10 cm      LV E/e' medial:  12.7 LV IVS:        0.95 cm      LV e' lateral:   7.07 cm/s LVOT diam:     1.90 cm      LV E/e' lateral: 17.4 LV SV:         49 LV SV Index:   26 LVOT Area:     2.84 cm  LV Volumes (MOD) LV vol d, MOD A2C: 112.0 ml LV vol d, MOD A4C: 85.5 ml LV vol s, MOD A2C: 44.1 ml LV vol s, MOD A4C: 37.3 ml LV SV MOD A2C:     67.9 ml LV SV MOD A4C:     85.5 ml LV SV MOD BP:      54.8 ml RIGHT VENTRICLE RV Basal diam:  2.80 cm RV S prime:     14.10 cm/s LEFT ATRIUM            Index        RIGHT ATRIUM          Index LA diam:      3.90 cm  2.03 cm/m   RA  Area:     8.39 cm LA Vol (A2C): 102.0 ml 53.17 ml/m  RA Volume:   16.60 ml 8.65 ml/m  AORTIC VALVE                     PULMONIC VALVE AV Area (Vmax):    1.56 cm      PV Vmax:        0.90 m/s AV Area (Vmean):   0.99 cm      PV Peak grad:   3.2 mmHg AV Area (VTI):     0.97 cm      RVOT Peak grad: 3 mmHg AV Vmax:           154.20 cm/s AV Vmean:          157.000 cm/s AV VTI:            0.507 m AV Peak Grad:      9.5 mmHg AV Mean Grad:      11.0 mmHg LVOT Vmax:         84.90 cm/s LVOT Vmean:  54.700 cm/s LVOT VTI:          0.174 m LVOT/AV VTI ratio: 0.34  AORTA Ao Root diam: 2.50 cm Ao Asc diam:  3.10 cm MITRAL VALVE MV Area (PHT): 4.21 cm     SHUNTS MV Decel Time: 180 msec     Systemic VTI:  0.17 m MV E velocity: 123.00 cm/s  Systemic Diam: 1.90 cm MV A velocity: 124.00 cm/s MV E/A ratio:  0.99 Dwayne D Callwood MD Electronically signed by Alwyn Pea MD Signature Date/Time: 08/12/2022/12:48:34 PM    Final         Scheduled Meds:  amLODipine  5 mg Oral Daily   aspirin EC  81 mg Oral Daily   calcium-vitamin D  1 tablet Oral Daily   cloNIDine  0.1 mg Oral QHS   ezetimibe  10 mg Oral QHS   gabapentin  300 mg Oral TID   insulin aspart  0-5 Units Subcutaneous QHS   insulin aspart  0-9 Units Subcutaneous TID WC   metoprolol succinate  50 mg Oral Daily   multivitamin with minerals  1 tablet Oral QPC breakfast   pantoprazole  40 mg Oral Daily   simvastatin  40 mg Oral QPM   sodium chloride  1 g Oral BID WC   Continuous Infusions:  heparin 1,300 Units/hr (08/14/22 0610)   magnesium sulfate bolus IVPB       LOS: 3 days    Time spent: 35 mins     Charise Killian, MD [facility] Pager [phone]  If 7PM-7AM, please contact night-coverage [email or web address] 08/14/2022, 8:23 AM

## 2022-08-14 NOTE — Evaluation (Signed)
Physical Therapy Evaluation Patient Details Name: Jodi Chavez MRN: [id number] DOB: [birth year] Today's Date: 08/14/2022  History of Present Illness  Pt is a 72 y/o F admitted on 08/11/22 after presenting with c/o SOB. Pt is being treated for acute on chronic diastolic CHF. PMH: dCHF, myocarditis 2/2 COVID, CKD3A, HTN, HLD, DM, depression, migraine HA, diabetic neuropathy, obesity  Clinical Impression  Pt seen for PT evaluation with pt agreeable. Pt reports prior to admission she was living with 91 y/o sister & pt's nephew, but they cannot provide physical assistance at d/c. Pt notes she was ambulating household distances with RW. On this date, pt requires min assist for STS & min assist to take a few steps in room with RW. Pt endorses BLE feet soreness & fatigue with mobility. Attempted to wean pt to room air but SPO2 drops to 88% after gait on room air, recovers with ~1 minute rest. SpO2 drops to 89% when ambulating on 1L/min but recovers a lot quicker with seated rest. PT provides education re: pursed lip breathing but fair<>poor return demo from pt. Pt left on 1L/min via nasal cannula.  Pt would benefit from ongoing PT services to address strengthening, balance, and endurance to increase independence & reduce fall risk with mobility.     Recommendations for follow up therapy are one component of a multi-disciplinary discharge planning process, led by the attending physician.  Recommendations may be updated based on patient status, additional functional criteria and insurance authorization.  Follow Up Recommendations Can patient physically be transported by private vehicle: Yes     Assistance Recommended at Discharge Intermittent Supervision/Assistance  Patient can return home with the following  A little help with walking and/or transfers;A little help with bathing/dressing/bathroom;Assist for transportation;Assistance with cooking/housework;Help with stairs or ramp for entrance    Equipment  Recommendations None recommended by PT  Recommendations for Other Services       Functional Status Assessment Patient has had a recent decline in their functional status and demonstrates the ability to make significant improvements in function in a reasonable and predictable amount of time.     Precautions / Restrictions Precautions Precautions: Fall Restrictions Weight Bearing Restrictions: No      Mobility  Bed Mobility               General bed mobility comments: not tested, pt received & left sitting in recliner    Transfers Overall transfer level: Needs assistance Equipment used: Rolling walker (2 wheels) Transfers: Sit to/from Stand Sit to Stand: Min assist           General transfer comment: PT provides education to scoot out to edge of seat, normal vs narrow BOS, and provides min assist for anterior weight shifting as pt is able to power up to standing with supervision & extra time.    Ambulation/Gait Ambulation/Gait assistance: Min assist Gait Distance (Feet):  (4 ft forwards + 4 ft backwards x 2) Assistive device: Rolling walker (2 wheels) Gait Pattern/deviations: Decreased step length - right, Decreased step length - left, Decreased stride length, Decreased dorsiflexion - right, Decreased dorsiflexion - left Gait velocity: decreased     General Gait Details: Pt initially holds to front bar of RW with PT providing education to hold to side of RW. Pt able to do so but still with forward trunk flexion. Gait limited by fatigue & pt reporting BLE foot soreness/pain.  Stairs            Psychologist, prison and probation services  Modified Rankin (Stroke Patients Only)       Balance Overall balance assessment: Needs assistance Sitting-balance support: Feet supported Sitting balance-Leahy Scale: Fair     Standing balance support: During functional activity, Bilateral upper extremity supported, Reliant on assistive device for balance Standing balance-Leahy Scale:  Fair                               Pertinent Vitals/Pain Pain Assessment Pain Assessment: Faces Faces Pain Scale: Hurts little more Pain Location: BLE feet Pain Descriptors / Indicators: Sore Pain Intervention(s): Monitored during session, Limited activity within patient's tolerance, Repositioned    Home Living Family/patient expects to be discharged to:: Private residence Living Arrangements: Other relatives (sister & nephew) Available Help at Discharge: Family (cannot provide physical assistance) Type of Home: House Home Access: Ramped entrance       Home Layout: One level Home Equipment: Rolling Walker (2 wheels) Additional Comments: Pt reports she moved in with her sister & nephew in 2016.    Prior Function               Mobility Comments: Pt reports she ambulates household distances with RW, denies falls. ADLs Comments: Sponge bathes as she has started having trouble stepping over into walk in shower.     Hand Dominance        Extremity/Trunk Assessment   Upper Extremity Assessment Upper Extremity Assessment: Generalized weakness    Lower Extremity Assessment Lower Extremity Assessment: Generalized weakness       Communication   Communication: No difficulties  Cognition Arousal/Alertness: Awake/alert Behavior During Therapy: WFL for tasks assessed/performed Overall Cognitive Status: Within Functional Limits for tasks assessed                                          General Comments      Exercises     Assessment/Plan    PT Assessment Patient needs continued PT services  PT Problem List Decreased strength;Decreased mobility;Decreased activity tolerance;Decreased balance;Decreased knowledge of use of DME;Cardiopulmonary status limiting activity       PT Treatment Interventions DME instruction;Therapeutic activities;Modalities;Gait training;Therapeutic exercise;Patient/family education;Balance training;Functional  mobility training;Neuromuscular re-education;Manual techniques    PT Goals (Current goals can be found in the Care Plan section)  Acute Rehab PT Goals Patient Stated Goal: get stronger PT Goal Formulation: With patient Time For Goal Achievement: 08/28/22 Potential to Achieve Goals: Good    Frequency Min 3X/week     Co-evaluation               AM-PAC PT "6 Clicks" Mobility  Outcome Measure Help needed turning from your back to your side while in a flat bed without using bedrails?: A Little Help needed moving from lying on your back to sitting on the side of a flat bed without using bedrails?: A Little Help needed moving to and from a bed to a chair (including a wheelchair)?: A Little Help needed standing up from a chair using your arms (e.g., wheelchair or bedside chair)?: A Little Help needed to walk in hospital room?: A Little Help needed climbing 3-5 steps with a railing? : A Lot 6 Click Score: 17    End of Session Equipment Utilized During Treatment: Gait belt;Oxygen Activity Tolerance: Patient tolerated treatment well Patient left: in chair (in handoff to OT)   PT Visit Diagnosis:  Muscle weakness (generalized) (M62.81);Difficulty in walking, not elsewhere classified (R26.2)    Time: 1610-9604 PT Time Calculation (min) (ACUTE ONLY): 23 min   Charges:   PT Evaluation $PT Eval Low Complexity: 1 Low          Aleda Grana, PT, DPT 08/14/22, 2:36 PM   Sandi Mariscal 08/14/2022, 2:34 PM

## 2022-08-14 NOTE — Evaluation (Signed)
Occupational Therapy Evaluation Patient Details Name: Jodi Chavez MRN: [id number] DOB: 1951-02-09 Today's Date: 08/14/2022   History of Present Illness Pt is a 72 y/o F admitted on 08/11/22 after presenting with c/o SOB. Pt is being treated for acute on chronic diastolic CHF. 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 Patient report living with with sister ( 52 y/o) and nephew. She endorses being mod I at baseline for mobility. Patient currently functioning at min A for sit <>stand and short distance mobility with RW to [facility]. Pt needing assistance for hygiene after urinating. Pt fatigues quickly.  Patient will benefit from acute OT to increase overall independence in the areas of ADLs, functional mobility, and safety awareness in order to safely discharge.     Recommendations for follow up therapy are one component of a multi-disciplinary discharge planning process, led by the attending physician.  Recommendations may be updated based on patient status, additional functional criteria and insurance authorization.   Assistance Recommended at Discharge Intermittent Supervision/Assistance  Patient can return home with the following A little help with walking and/or transfers;A little help with bathing/dressing/bathroom;Assistance with cooking/housework;Assist for transportation;Help with stairs or ramp for entrance    Functional Status Assessment  Patient has had a recent decline in their functional status and demonstrates the ability to make significant improvements in function in a reasonable and predictable amount of time.  Equipment Recommendations  Other (comment) (defer to next venue of care)    Recommendations for Other Services       Precautions / Restrictions  Precautions Precautions: Fall Restrictions Weight Bearing Restrictions: No      Mobility Bed Mobility               General bed mobility comments: not tested, pt received & left sitting in recliner    Transfers Overall transfer level: Needs assistance Equipment used: Rolling walker (2 wheels) Transfers: Sit to/from Stand Sit to Stand: Min assist                  Balance Overall balance assessment: Needs assistance Sitting-balance support: Feet supported Sitting balance-Leahy Scale: Fair     Standing balance support: During functional activity, Bilateral upper extremity supported, Reliant on assistive device for balance Standing balance-Leahy Scale: Fair                             ADL either performed or assessed with clinical judgement   ADL Overall ADL's : Needs assistance/impaired     Grooming: Wash/dry hands;Wash/dry face;Sitting;Set up                   Toilet Transfer: BSC/3in1;Rolling walker (2 wheels);Ambulation;Minimal assistance   Toileting- Clothing Manipulation and Hygiene: Minimal assistance;Sit to/from stand               Vision Patient Visual Report: No change from baseline              Pertinent Vitals/Pain Pain Assessment Pain Assessment: Faces Faces Pain Scale: Hurts little more Pain Location: BLE feet Pain Descriptors / Indicators: Sore Pain Intervention(s): Monitored during session, Repositioned     Hand Dominance Right   Extremity/Trunk Assessment Upper Extremity Assessment Upper Extremity Assessment: Generalized weakness   Lower Extremity Assessment Lower Extremity Assessment: Generalized weakness   Cervical /  Trunk Assessment Cervical / Trunk Assessment: Normal   Communication Communication Communication: No difficulties   Cognition Arousal/Alertness: Awake/alert Behavior During Therapy: WFL for tasks assessed/performed Overall Cognitive Status: Within Functional Limits for tasks  assessed                                                  Home Living Family/patient expects to be discharged to:: Private residence Living Arrangements: Other relatives (sister and nephew) Available Help at Discharge: Family Type of Home: House Home Access: Ramped entrance     Home Layout: One level         Bathroom Toilet: Standard     Home Equipment: Agricultural consultant (2 wheels)   Additional Comments: Pt reports she moved in with her sister & nephew in 2016.      Prior Functioning/Environment Prior Level of Function : Independent/Modified Independent             Mobility Comments: Pt reports she ambulates household distances with RW, denies falls. ADLs Comments: Sponge bathes as she has started having trouble stepping over into walk in shower.        OT Problem List: Decreased strength;Decreased activity tolerance;Decreased safety awareness;Impaired balance (sitting and/or standing);Decreased knowledge of use of DME or AE      OT Treatment/Interventions: Self-care/ADL training;Therapeutic exercise;Therapeutic activities;Energy conservation;DME and/or AE instruction;Patient/family education;Balance training    OT Goals(Current goals can be found in the care plan section) Acute Rehab OT Goals Patient Stated Goal: to go home OT Goal Formulation: With patient Time For Goal Achievement: 08/28/22 Potential to Achieve Goals: Fair ADL Goals Pt Will Perform Grooming: with supervision;standing Pt Will Perform Lower Body Dressing: with supervision;sit to/from stand Pt Will Transfer to Toilet: with supervision;ambulating Pt Will Perform Toileting - Clothing Manipulation and hygiene: with supervision;sit to/from stand  OT Frequency: Min 2X/week       AM-PAC OT "6 Clicks" Daily Activity     Outcome Measure Help from another person eating meals?: None Help from another person taking care of personal grooming?: A Little Help from another person  toileting, which includes using toliet, bedpan, or urinal?: A Lot Help from another person bathing (including washing, rinsing, drying)?: A Lot Help from another person to put on and taking off regular upper body clothing?: None Help from another person to put on and taking off regular lower body clothing?: A Lot 6 Click Score: 17   End of Session Equipment Utilized During Treatment: Rolling walker (2 wheels) Nurse Communication: Mobility status  Activity Tolerance: Patient tolerated treatment well Patient left: in bed;with call bell/phone within reach;with bed alarm set  OT Visit Diagnosis: Unsteadiness on feet (R26.81);Repeated falls (R29.6);Muscle weakness (generalized) (M62.81)                Time: 1610-9604 OT Time Calculation (min): 28 min Charges:  OT General Charges $OT Visit: 1 Visit OT Evaluation $OT Eval Moderate Complexity: 1 Mod OT Treatments $Self Care/Home Management : 8-22 mins  Jackquline Denmark, MS, OTR/L , CBIS ascom [phone]  08/14/22, 3:55 PM

## 2022-08-15 DIAGNOSIS — I5033 Acute on chronic diastolic (congestive) heart failure: Secondary | ICD-10-CM | POA: Diagnosis not present

## 2022-08-15 LAB — CBC
HCT: 33.7 % — ABNORMAL LOW (ref 36.0–46.0)
Hemoglobin: 11.2 g/dL — ABNORMAL LOW (ref 12.0–15.0)
MCH: 32.5 pg (ref 26.0–34.0)
MCHC: 33.2 g/dL (ref 30.0–36.0)
MCV: 97.7 fL (ref 80.0–100.0)
Platelets: 216 10*3/uL (ref 150–400)
RBC: 3.45 MIL/uL — ABNORMAL LOW (ref 3.87–5.11)
RDW: 13.2 % (ref 11.5–15.5)
WBC: 10.9 10*3/uL — ABNORMAL HIGH (ref 4.0–10.5)
nRBC: 0 % (ref 0.0–0.2)

## 2022-08-15 LAB — BASIC METABOLIC PANEL
Anion gap: 12 (ref 5–15)
BUN: 27 mg/dL — ABNORMAL HIGH (ref 8–23)
CO2: 29 mmol/L (ref 22–32)
Calcium: 9.3 mg/dL (ref 8.9–10.3)
Chloride: 91 mmol/L — ABNORMAL LOW (ref 98–111)
Creatinine, Ser: 1.34 mg/dL — ABNORMAL HIGH (ref 0.44–1.00)
GFR, Estimated: 43 mL/min — ABNORMAL LOW (ref >60)
Glucose, Bld: 117 mg/dL — ABNORMAL HIGH (ref 70–99)
Potassium: 4.5 mmol/L (ref 3.5–5.1)
Sodium: 132 mmol/L — ABNORMAL LOW (ref 135–145)

## 2022-08-15 LAB — MAGNESIUM: Magnesium: 1.9 mg/dL (ref 1.7–2.4)

## 2022-08-15 LAB — GLUCOSE, CAPILLARY
Glucose-Capillary: 142 mg/dL — ABNORMAL HIGH (ref 70–99)
Glucose-Capillary: 146 mg/dL — ABNORMAL HIGH (ref 70–99)
Glucose-Capillary: 125 mg/dL — ABNORMAL HIGH (ref 70–99)
Glucose-Capillary: 142 mg/dL — ABNORMAL HIGH (ref 70–99)

## 2022-08-15 LAB — HEPARIN LEVEL (UNFRACTIONATED): Heparin Unfractionated: 0.17 IU/mL — ABNORMAL LOW (ref 0.30–0.70)

## 2022-08-15 MED ORDER — SPIRONOLACTONE 12.5 MG HALF TABLET
12.5000 mg | ORAL_TABLET | Freq: Every day | ORAL | Status: DC
  Administered 2022-08-15 – 2022-08-17 (×3): 12.5 mg via ORAL
  Filled 2022-08-15 (×3): qty 1

## 2022-08-15 MED ORDER — FUROSEMIDE 10 MG/ML IJ SOLN
40.0000 mg | Freq: Once | INTRAMUSCULAR | Status: AC
  Administered 2022-08-15: 40 mg via INTRAVENOUS
  Filled 2022-08-15: qty 4

## 2022-08-15 MED ORDER — POLYETHYLENE GLYCOL 3350 17 G PO PACK
17.0000 g | PACK | Freq: Every day | ORAL | Status: DC
  Administered 2022-08-16 – 2022-08-17 (×2): 17 g via ORAL
  Filled 2022-08-15 (×3): qty 1

## 2022-08-15 MED ORDER — FUROSEMIDE 10 MG/ML IJ SOLN
60.0000 mg | Freq: Once | INTRAMUSCULAR | Status: AC
  Administered 2022-08-15: 60 mg via INTRAVENOUS
  Filled 2022-08-15: qty 6

## 2022-08-15 NOTE — Progress Notes (Signed)
OT Cancellation Note  Patient Details Name: DEZTINEE AIKEN MRN: [id number] DOB: 1950-05-30   Cancelled Treatment:    Reason Eval/Treat Not Completed: Fatigue/lethargy limiting ability to participate;Other (comment) (Pt received on BSC, reciving IV medication for c/o nausea. Requests OT return when IV has been disconnected. RN aware of pt's position.) Chandan Fly L. Hershell Brandl, OTR/L  08/15/22, 1:58 PM

## 2022-08-15 NOTE — Progress Notes (Addendum)
Progress Note    Jodi Chavez  ZOX:[id number] DOB: 02-May-1950  DOA: 08/11/2022 PCP: Fleet Contras, MD      Brief Narrative:    Medical records reviewed and are as summarized below:  Jodi Chavez is a 72 y.o. 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  Resident saturation dropped to 87% on room air and is improved to 97% on 2 L/min oxygen.  Troponins went from 1 62-234 and BNP was 1,096 on admission.   She was admitted to the hospital for acute on chronic diastolic CHF complicated by acute hypoxic respiratory failure.  She was treated with IV Lasix.      Assessment/Plan:   Principal Problem:   Acute on chronic diastolic CHF (congestive heart failure) (HCC) Active Problems:   Myocardial injury   Essential hypertension   Hyponatremia   HLD (hyperlipidemia)   Type II diabetes mellitus with renal manifestations (HCC)   Chronic kidney disease, stage 3a (HCC)   Leukocytosis   Obesity (BMI 30-39.9)   Body mass index is 33.38 kg/m.  (Obesity)   Acute on chronic diastolic CHF: Continue IV Lasix.  Follow BMP, daily weight and urine output.  Continue empagliflozin, Toprol and Aldactone.  Follow-up with cardiologist.  2D echo showed EF estimated at 50 to 55%, normal LV diastolic parameters.   Elevated troponins: This is likely due to demand ischemia.  S/p treatment with IV heparin drip.   Acute hypoxic respiratory failure: She is on 2 L/min oxygen via [place].  Oxygen saturation dropped to 82% on room air today.  Wean off oxygen as able.   Nausea: IV Zofran and Phenergan as needed.   CKD stage IIIa: Creatinine is stable.   Hyponatremia: Chart review shows patient has chronic hyponatremia.     Leukocytosis:  Improved   Other comorbidities include hypertension, hyperlipidemia, type II DM (hemoglobin A1c 6.4)  Diet Order             Diet heart healthy/carb modified Room service appropriate? Yes; Fluid consistency: Thin; Fluid restriction: 1200 mL Fluid  Diet effective now                            Consultants: Cardiologist  Procedures: None    Medications:    aspirin EC  81 mg Oral Daily   calcium-vitamin D  1 tablet Oral Daily   cloNIDine  0.1 mg Oral QHS   empagliflozin  10 mg Oral Daily   enoxaparin (LOVENOX) injection  40 mg Subcutaneous Q24H   ezetimibe  10 mg Oral QHS   gabapentin  300 mg Oral TID   insulin aspart  0-5 Units Subcutaneous QHS   insulin aspart  0-9 Units Subcutaneous TID WC   metoprolol succinate  50 mg Oral Daily   multivitamin with minerals  1 tablet Oral QPC breakfast   pantoprazole  40 mg Oral Daily   polyethylene glycol  17 g Oral Daily   simvastatin  40 mg Oral QPM   sodium chloride  1 g Oral BID WC   spironolactone  12.5 mg Oral Daily   Continuous Infusions:  promethazine (PHENERGAN) injection (IM or IVPB) 12.5 mg (08/15/22 0343)     Anti-infectives (From admission, onward)    None  Family Communication/Anticipated D/C date and plan/Code Status   DVT prophylaxis: enoxaparin (LOVENOX) injection 40 mg Start: 08/14/22 2200     Code Status: Full Code  Family Communication: None Disposition Plan: Plan to discharge to SNF   Status is: Inpatient Remains inpatient appropriate because: IV Lasix for CHF exacerbation       Subjective:   Interval events noted.  She complains of nausea.  No vomiting, shortness of breath or chest pain. Shemia, RN, was at the bedside  Objective:    Vitals:   08/14/22 2109 08/14/22 2325 08/15/22 0328 08/15/22 0900  BP: (!) 140/83 129/67 131/67 (!) 111/93  Pulse: 87 82 91 (!) 106  Resp:  18 18 18   Temp:  98.3 F (36.8 C) 98.2 F (36.8 C) 98.3 F (36.8 C)   TempSrc:  Oral Oral   SpO2:  95% 91% 90%  Weight:   88.2 kg   Height:       No data found.   Intake/Output Summary (Last 24 hours) at 08/15/2022 1238 Last data filed at 08/15/2022 1059 Gross per 24 hour  Intake 300.61 ml  Output 1800 ml  Net -1499.39 ml   Filed Weights   08/13/22 0500 08/14/22 0418 08/15/22 0328  Weight: 90.5 kg 87.7 kg 88.2 kg    Exam:  GEN: NAD SKIN: Warm and dry EYES: EOMI ENT: MMM CV: RRR PULM: Bibasilar rales, no wheezing ABD: soft, ND, NT, +BS CNS: AAO x 3, non focal EXT: Bilateral leg edema, no erythema or tenderness        Data Reviewed:   I have personally reviewed following labs and imaging studies:  Labs: Labs show the following:   Basic Metabolic Panel: Recent Labs  Lab 08/11/22 2204 08/12/22 0214 08/12/22 1259 08/12/22 2130 08/13/22 1322 08/14/22 0442 08/15/22 0403  NA 130* 132* 132* 132* 130* 129* 132*  K 4.5 4.6 4.7 4.7 4.5 4.5 4.5  CL 91* 93* 95* 95* 92* 92* 91*  CO2 28 30 29 28 28 28 29   GLUCOSE 131* 118* 144* 176* 182* 130* 117*  BUN 24* 23 25* 26* 28* 26* 27*  CREATININE 1.41* 1.34* 1.34* 1.61* 1.39* 1.34* 1.34*  CALCIUM 9.5 9.4 9.0 9.1 8.9 9.0 9.3  MG 1.7 1.7  --   --   --  1.5* 1.9   GFR Estimated Creatinine Clearance: 42 mL/min (A) (by C-G formula based on SCr of 1.34 mg/dL (H)). Liver Function Tests: Recent Labs  Lab 08/11/22 1430  AST 29  ALT 17  ALKPHOS 64  BILITOT 0.6  PROT 7.0  ALBUMIN 4.1   No results for input(s): "LIPASE", "AMYLASE" in the last 168 hours. No results for input(s): "AMMONIA" in the last 168 hours. Coagulation profile Recent Labs  Lab 08/11/22 1649  INR 1.0    CBC: Recent Labs  Lab 08/11/22 1430 08/12/22 1040 08/13/22 0352 08/14/22 0442 08/15/22 0403  WBC 13.9* 9.9 10.4 11.7* 10.9*  HGB 14.2 12.1 11.4* 11.4* 11.2*  HCT 42.2 36.3 34.5* 33.1* 33.7*  MCV 96.3 97.6 98.3 97.1 97.7  PLT 233 201 193 214 216   Cardiac Enzymes: No results for input(s): "CKTOTAL",  "CKMB", "CKMBINDEX", "TROPONINI" in the last 168 hours. BNP (last 3 results) No results for input(s): "PROBNP" in the last 8760 hours. CBG: Recent Labs  Lab 08/14/22 0830 08/14/22 1228 08/14/22 1604 08/14/22 2100 08/15/22 0902  GLUCAP 151* 133* 130* 137* 142*   D-Dimer: No results for input(s): "DDIMER" in the last 72 hours. Hgb A1c: No results  for input(s): "HGBA1C" in the last 72 hours. Lipid Profile: No results for input(s): "CHOL", "HDL", "LDLCALC", "TRIG", "CHOLHDL", "LDLDIRECT" in the last 72 hours. Thyroid function studies: No results for input(s): "TSH", "T4TOTAL", "T3FREE", "THYROIDAB" in the last 72 hours.  Invalid input(s): "FREET3" Anemia work up: No results for input(s): "VITAMINB12", "FOLATE", "FERRITIN", "TIBC", "IRON", "RETICCTPCT" in the last 72 hours. Sepsis Labs: Recent Labs  Lab 08/11/22 1430 08/12/22 1040 08/13/22 0352 08/14/22 0442 08/15/22 0403  PROCALCITON <0.10  --   --   --   --   WBC 13.9* 9.9 10.4 11.7* 10.9*    Microbiology Recent Results (from the past 240 hour(s))  SARS Coronavirus 2 by RT PCR (hospital order, performed in [facility] hospital lab) *cepheid single result test* Anterior Nasal Swab     Status: None   Collection Time: 08/11/22  2:45 PM   Specimen: Anterior Nasal Swab  Result Value Ref Range Status   SARS Coronavirus 2 by RT PCR NEGATIVE NEGATIVE Final    Comment: (NOTE) SARS-CoV-2 target nucleic acids are NOT DETECTED.  The SARS-CoV-2 RNA is generally detectable in upper and lower respiratory specimens during the acute phase of infection. The lowest concentration of SARS-CoV-2 viral copies this assay can detect is 250 copies / mL. A negative result does not preclude SARS-CoV-2 infection and should not be used as the sole basis for treatment or other patient management decisions.  A negative result may occur with improper specimen collection / handling, submission of specimen other than nasopharyngeal swab, presence of  viral mutation(s) within the areas targeted by this assay, and inadequate number of viral copies (<250 copies / mL). A negative result must be combined with clinical observations, patient history, and epidemiological information.  Fact Sheet for Patients:   RoadLapTop.co.za  Fact Sheet for Healthcare Providers: http://kim-miller.com/  This test is not yet approved or  cleared by the [place] FDA and has been authorized for detection and/or diagnosis of SARS-CoV-2 by FDA under an Emergency Use Authorization (EUA).  This EUA will remain in effect (meaning this test can be used) for the duration of the COVID-19 declaration under Section 564(b)(1) of the Act, 21 U.S.C. section 360bbb-3(b)(1), unless the authorization is terminated or revoked sooner.  Performed at [facility], [address], [place], [place] [address]     Procedures and diagnostic studies:  DG Chest 2 View  Result Date: 08/14/2022 CLINICAL DATA:  Shortness of breath. EXAM: CHEST - 2 VIEW COMPARISON:  August 11, 2022. FINDINGS: Stable cardiomediastinal silhouette. Mild diffuse interstitial densities are noted throughout both lungs which may represent pulmonary edema. Hypoinflation of the lungs is noted. Small pleural effusions are present. Bony thorax is unremarkable. IMPRESSION: Hypoinflation of the lungs. Probable mild bilateral pulmonary edema with small pleural effusions. Electronically Signed   By: Lupita Raider M.D.   On: 08/14/2022 15:02               LOS: 4 days   Zaydn Gutridge  [facility]   Pager on [email or web address]. If 7PM-7AM, please contact night-coverage at [email or web address]     08/15/2022, 12:38 PM

## 2022-08-15 NOTE — Care Management Important Message (Signed)
Important Message  Patient Details  Name: Jodi Chavez MRN: [id number] Date of Birth: 02-Jun-1950   Medicare Important Message Given:  N/A - LOS <3 / Initial given by admissions     Johnell Comings 08/15/2022, 8:14 AM

## 2022-08-15 NOTE — Progress Notes (Addendum)
[facility] CLINIC CARDIOLOGY PROGRESS NOTE   Patient ID: Jodi Chavez MRN: [id number] DOB/AGE: February 05, 1951 72 y.o.  Admit date: 08/11/2022 Referring Physician Dr. Lorretta Harp  Primary Physician Dr. Fleet Contras Primary Cardiologist Marijo Conception, NP Reason for Consultation AoCHF  HPI: Jodi Chavez is a 72 y.o. female with a past medical history of diastolic congestive heart failure, COVID-related myocarditis, chronic renal sufficiency stage IIIa, hypertension, hyperlipidemia, diabetes, obesity, diabetic neuropathy  who presented to the ED on 08/11/2022 for shortness of breath. Cardiology consulted for further assistance with her CHF. Echo this admission revealed low normal EF 50-55%, mild-moderate aortic stenosis.   Interval History:  - feels constipated and nauseous, had a BM last night and nausea improved some  - no chest pain, dyspnea, or palpitations - peripheral edema improving - discussed echo and current treatment/medications in detail   Review of systems complete and found to be negative unless listed above    Vitals:   08/14/22 2109 08/14/22 2325 08/15/22 0328 08/15/22 0900  BP: (!) 140/83 129/67 131/67 (!) 111/93  Pulse: 87 82 91 (!) 106  Resp:  18 18 18   Temp:  98.3 F (36.8 C) 98.2 F (36.8 C) 98.3 F (36.8 C)  TempSrc:  Oral Oral   SpO2:  95% 91% 90%  Weight:   88.2 kg   Height:         Intake/Output Summary (Last 24 hours) at 08/15/2022 1237 Last data filed at 08/15/2022 1059 Gross per 24 hour  Intake 300.61 ml  Output 1800 ml  Net -1499.39 ml    PHYSICAL EXAM General: Elderly female, well nourished, in no acute distress laying at an incline in hospital bed. HEENT: Normocephalic and atraumatic. Neck: No JVD.  Lungs: Normal respiratory effort on 1L. Clear bilaterally to auscultation. No wheezes, rhonchi. +crackles R lung base  Heart: HRRR. Normal S1 and S2 without gallops or murmurs. Radial & DP pulses 2+ bilaterally. Abdomen: Non-distended appearing.   Msk: Normal strength and tone for age. Extremities: No clubbing, cyanosis. Trace bilateral LE edema bilaterally   Neuro: Alert and oriented X 3. Psych: Mood appropriate, affect congruent.    LABS: Basic Metabolic Panel: Recent Labs    08/14/22 0442 08/15/22 0403  NA 129* 132*  K 4.5 4.5  CL 92* 91*  CO2 28 29  GLUCOSE 130* 117*  BUN 26* 27*  CREATININE 1.34* 1.34*  CALCIUM 9.0 9.3  MG 1.5* 1.9    Liver Function Tests: No results for input(s): "AST", "ALT", "ALKPHOS", "BILITOT", "PROT", "ALBUMIN" in the last 72 hours.  No results for input(s): "LIPASE", "AMYLASE" in the last 72 hours. CBC: Recent Labs    08/14/22 0442 08/15/22 0403  WBC 11.7* 10.9*  HGB 11.4* 11.2*  HCT 33.1* 33.7*  MCV 97.1 97.7  PLT 214 216    Cardiac Enzymes: No results for input(s): "CKTOTAL", "CKMB", "CKMBINDEX", "TROPONINIHS" in the last 72 hours.  BNP: No results for input(s): "BNP" in the last 72 hours.  D-Dimer: No results for input(s): "DDIMER" in the last 72 hours. Hemoglobin A1C: No results for input(s): "HGBA1C" in the last 72 hours.  Fasting Lipid Panel: No results for input(s): "CHOL", "HDL", "LDLCALC", "TRIG", "CHOLHDL", "LDLDIRECT" in the last 72 hours.  Thyroid Function Tests: No results for input(s): "TSH", "T4TOTAL", "T3FREE", "THYROIDAB" in the last 72 hours.  Invalid input(s): "FREET3" Anemia Panel: No results for input(s): "VITAMINB12", "FOLATE", "FERRITIN", "TIBC", "IRON", "RETICCTPCT" in the last 72 hours.  DG Chest 2 View  Result Date: 08/14/2022  CLINICAL DATA:  Shortness of breath. EXAM: CHEST - 2 VIEW COMPARISON:  August 11, 2022. FINDINGS: Stable cardiomediastinal silhouette. Mild diffuse interstitial densities are noted throughout both lungs which may represent pulmonary edema. Hypoinflation of the lungs is noted. Small pleural effusions are present. Bony thorax is unremarkable. IMPRESSION: Hypoinflation of the lungs. Probable mild bilateral pulmonary edema  with small pleural effusions. Electronically Signed   By: Lupita Raider M.D.   On: 08/14/2022 15:02     ECHO 08/12/2022:  1. Left ventricular ejection fraction, by estimation, is 50 to 55%. The left ventricle has low normal function. The left ventricle demonstrates regional wall motion abnormalities (see scoring diagram/findings for description). Left ventricular diastolic parameters were normal.   2. Right ventricular systolic function is normal. The right ventricular size is normal.   3. The mitral valve is normal in structure. Trivial mitral valve regurgitation.   4. The aortic valve is grossly normal. Aortic valve regurgitation is not visualized. Moderate aortic valve stenosis.   TELEMETRY reviewed by me 08/15/22: NSR - sinus tach rate 80-100s  EKG reviewed by me 08/15/22: NSR bigeminy  DATA reviewed by me 08/15/22: hospitalist progress note, last 24h vitals tele labs imaging I/O  Principal Problem:   Acute on chronic diastolic CHF (congestive heart failure) (HCC) Active Problems:   Essential hypertension   HLD (hyperlipidemia)   Myocardial injury   Type II diabetes mellitus with renal manifestations (HCC)   Chronic kidney disease, stage 3a (HCC)   Leukocytosis   Hyponatremia   Obesity (BMI 30-39.9)  ASSESSMENT AND PLAN: Jodi Chavez is a 72 y.o. female with a past medical history of diastolic congestive heart failure, COVID-related myocarditis, chronic renal sufficiency stage IIIa, hypertension, hyperlipidemia, diabetes, obesity, diabetic neuropathy  who presented to the ED on 08/11/2022 for shortness of breath. Cardiology consulted for further assistance with her CHF. Echo this admission revealed low normal EF 50-55%, mild-moderate aortic stenosis.   # Nausea # constipation - nausea yesterday AM following a meal that resolved after having a BM. Recurrent nausea this AM, miralax pending (daily home med per patient)  -Management per primary.   # Acute on Chronic HFpEF # mild  - moderate aortic stenosis  Patient presented with worsening SOB and LE edema. Currently on 1L  - no oxygen requirement at baseline. Net negative 2.2L since admission. Volume status clinically improving from admission but remains with R base crackles, and small pleural effusions on repeat CXR 6/18 -S/p IV Lasix 60 mg x1 on 6/15, 6/17.  -IV lasix 40 mg this AM, dose another 60mg  IV this PM - Continue GDMT with metoprolol XL 50mg  daily, jardiance 10mg  daily - start spironolactone 12.5mg  daily   -- likely d/c home hydrochlorothiazide at discharge in favor of lasix and d/c home amlodipine in favor of other GDMT titration. Ideally restart benazepril at discharge for GDMT.  - needs to mobilize, into chair/OOB as tolerated   # Demand Ischemia # Hyperlipidemia Patient with central chest discomfort on admission, trops trended 162 (6/15) > 234 > 465 > 627 > 636 (6/16) -Patient continues to deny chest pain this AM.  -S/p heparin infusion x 64 hours. Discontinued today, patient to be put on lovenox for DVT ppx.  -No plan for additional cardiac diagnostics.  -Continue simvastatin and zetia, cholesterol well controlled on lipid panel.   # Chronic Kidney Disease Stage IIIa # Hyponatremia Cr stable at 1.34, GFR 43. -continue to monitor closely.   This patient's case was discussed and created  with Dr. Juliann Pares and he is in agreement.  Rebeca Allegra, PA-C 08/15/2022, 12:37 PM [facility] Cardiology

## 2022-08-15 NOTE — TOC Progression Note (Signed)
Transition of Care [facility]) - CM/SW Discharge Note   Patient Details  Name: Jodi Chavez MRN: [id number] Date of Birth: 03-20-1950  [facility]) CM/SW Contact:  Garret Reddish, RN Phone Number: 08/15/2022, 2:35 PM   Clinical Narrative:   I have reviewed bed offers with Mrs. Stirewalt. She reports that she would like to think about the offers and follow back up with TOC.  Noted that patient was admitted with Acute on chronic diastolic CHF (congestive heart failure).  Patient is currently receiving IV Lasix and on 02 at 2L per [place].  Mrs. Helder is complaining of nausea and poor po intake.  She reports that she is not feeling too well today.  PT/OT will continue to work with patient for mobilization.    TOC will continue to follow for discharge planing.        Barriers to Discharge: Continued Medical Work up   Patient Goals and CMS Choice CMS Medicare.gov Compare Post Acute Care list provided to:: Patient    Discharge Placement                         Discharge Plan and Services Additional resources added to the After Visit Summary for       Post Acute Care Choice: Skilled Nursing Facility                               Social Determinants of Health (SDOH) Interventions SDOH Screenings   Food Insecurity: No Food Insecurity (08/12/2022)  Housing: Low Risk  (08/12/2022)  Transportation Needs: No Transportation Needs (08/12/2022)  Utilities: Not At Risk (08/12/2022)  Tobacco Use: Low Risk  (08/11/2022)     Readmission Risk Interventions     No data to display

## 2022-08-15 NOTE — Progress Notes (Signed)
PT Cancellation Note  Patient Details Name: Jodi Chavez MRN: [id number] DOB: 1950-10-17   Cancelled Treatment:    Reason Eval/Treat Not Completed: Other (comment) Patient reports nausea, states she just received zofran. Will re-attempt later today.    Jayquon Theiler 08/15/2022, 9:40 AM

## 2022-08-16 DIAGNOSIS — I5033 Acute on chronic diastolic (congestive) heart failure: Secondary | ICD-10-CM | POA: Diagnosis not present

## 2022-08-16 LAB — CBC
HCT: 33.8 % — ABNORMAL LOW (ref 36.0–46.0)
Hemoglobin: 11.3 g/dL — ABNORMAL LOW (ref 12.0–15.0)
MCH: 32.6 pg (ref 26.0–34.0)
MCHC: 33.4 g/dL (ref 30.0–36.0)
MCV: 97.4 fL (ref 80.0–100.0)
Platelets: 246 10*3/uL (ref 150–400)
RBC: 3.47 MIL/uL — ABNORMAL LOW (ref 3.87–5.11)
RDW: 13.2 % (ref 11.5–15.5)
WBC: 12.9 10*3/uL — ABNORMAL HIGH (ref 4.0–10.5)
nRBC: 0 % (ref 0.0–0.2)

## 2022-08-16 LAB — BASIC METABOLIC PANEL
Anion gap: 13 (ref 5–15)
BUN: 33 mg/dL — ABNORMAL HIGH (ref 8–23)
CO2: 30 mmol/L (ref 22–32)
Calcium: 9.5 mg/dL (ref 8.9–10.3)
Chloride: 91 mmol/L — ABNORMAL LOW (ref 98–111)
Creatinine, Ser: 1.5 mg/dL — ABNORMAL HIGH (ref 0.44–1.00)
GFR, Estimated: 37 mL/min — ABNORMAL LOW (ref >60)
Glucose, Bld: 118 mg/dL — ABNORMAL HIGH (ref 70–99)
Potassium: 4.3 mmol/L (ref 3.5–5.1)
Sodium: 134 mmol/L — ABNORMAL LOW (ref 135–145)

## 2022-08-16 LAB — GLUCOSE, CAPILLARY
Glucose-Capillary: 142 mg/dL — ABNORMAL HIGH (ref 70–99)
Glucose-Capillary: 128 mg/dL — ABNORMAL HIGH (ref 70–99)
Glucose-Capillary: 153 mg/dL — ABNORMAL HIGH (ref 70–99)
Glucose-Capillary: 158 mg/dL — ABNORMAL HIGH (ref 70–99)

## 2022-08-16 LAB — MAGNESIUM: Magnesium: 1.8 mg/dL (ref 1.7–2.4)

## 2022-08-16 NOTE — Progress Notes (Signed)
[facility] CLINIC CARDIOLOGY PROGRESS NOTE   Patient ID: Jodi Chavez MRN: [id number] DOB/AGE: Sep 01, 1950 72 y.o.  Admit date: 08/11/2022 Referring Physician Dr. Lorretta Harp  Primary Physician Dr. Fleet Contras Primary Cardiologist Marijo Conception, NP Reason for Consultation AoCHF  HPI: EVALISE DELLY is a 72 y.o. female with a past medical history of diastolic congestive heart failure, COVID-related myocarditis, chronic renal sufficiency stage IIIa, hypertension, hyperlipidemia, diabetes, obesity, diabetic neuropathy  who presented to the ED on 08/11/2022 for shortness of breath. Cardiology consulted for further assistance with her CHF. Echo this admission revealed low normal EF 50-55%, mild-moderate aortic stenosis.   Interval History:  - main complaint is knee and hip joint pain - continues to deny chest pain, dyspnea, or palpitations - peripheral edema resolved   Review of systems complete and found to be negative unless listed above    Vitals:   08/16/22 0018 08/16/22 0337 08/16/22 0806 08/16/22 1242  BP: 126/72 129/62 135/70 127/71  Pulse: 88 92 (!) 101 94  Resp: 18 20 18 18   Temp: 98 F (36.7 C) 98.5 F (36.9 C) 98.3 F (36.8 C) 99.6 F (37.6 C)  TempSrc:      SpO2: 93% 96% 95% 97%  Weight:  89 kg    Height:         Intake/Output Summary (Last 24 hours) at 08/16/2022 1404 Last data filed at 08/16/2022 1100 Gross per 24 hour  Intake 495 ml  Output 2000 ml  Net -1505 ml    PHYSICAL EXAM General: Elderly female, well nourished, in no acute distress laying at an incline in hospital bed. HEENT: Normocephalic and atraumatic. Neck: No JVD.  Lungs: Normal respiratory effort on 1L. Clear bilaterally to auscultation with decreased breath sounds in bases Heart: HRRR. Normal S1 and S2 without gallops or murmurs. Radial & DP pulses 2+ bilaterally. Abdomen: Non-distended appearing.  Msk: Normal strength and tone for age. Deconditioned.  Extremities: No clubbing, cyanosis.  Trace bilateral LE edema bilaterally   Neuro: Alert and oriented X 3. Psych: Mood appropriate, affect congruent.    LABS: Basic Metabolic Panel: Recent Labs    08/15/22 0403 08/16/22 0414  NA 132* 134*  K 4.5 4.3  CL 91* 91*  CO2 29 30  GLUCOSE 117* 118*  BUN 27* 33*  CREATININE 1.34* 1.50*  CALCIUM 9.3 9.5  MG 1.9 1.8    Liver Function Tests: No results for input(s): "AST", "ALT", "ALKPHOS", "BILITOT", "PROT", "ALBUMIN" in the last 72 hours.  No results for input(s): "LIPASE", "AMYLASE" in the last 72 hours. CBC: Recent Labs    08/15/22 0403 08/16/22 0414  WBC 10.9* 12.9*  HGB 11.2* 11.3*  HCT 33.7* 33.8*  MCV 97.7 97.4  PLT 216 246    Cardiac Enzymes: No results for input(s): "CKTOTAL", "CKMB", "CKMBINDEX", "TROPONINIHS" in the last 72 hours.  BNP: No results for input(s): "BNP" in the last 72 hours.  D-Dimer: No results for input(s): "DDIMER" in the last 72 hours. Hemoglobin A1C: No results for input(s): "HGBA1C" in the last 72 hours.  Fasting Lipid Panel: No results for input(s): "CHOL", "HDL", "LDLCALC", "TRIG", "CHOLHDL", "LDLDIRECT" in the last 72 hours.  Thyroid Function Tests: No results for input(s): "TSH", "T4TOTAL", "T3FREE", "THYROIDAB" in the last 72 hours.  Invalid input(s): "FREET3" Anemia Panel: No results for input(s): "VITAMINB12", "FOLATE", "FERRITIN", "TIBC", "IRON", "RETICCTPCT" in the last 72 hours.  No results found.   ECHO 08/12/2022:  1. Left ventricular ejection fraction, by estimation, is 50 to 55%.  The left ventricle has low normal function. The left ventricle demonstrates regional wall motion abnormalities (see scoring diagram/findings for description). Left ventricular diastolic parameters were normal.   2. Right ventricular systolic function is normal. The right ventricular size is normal.   3. The mitral valve is normal in structure. Trivial mitral valve regurgitation.   4. The aortic valve is grossly normal. Aortic  valve regurgitation is not visualized. Moderate aortic valve stenosis.   TELEMETRY reviewed by me 08/16/22: NSR - sinus tach rate 80-100s  EKG reviewed by me 08/16/22: NSR bigeminy  DATA reviewed by me 08/16/22: hospitalist progress note, last 24h vitals tele labs imaging I/O  Principal Problem:   Acute on chronic diastolic CHF (congestive heart failure) (HCC) Active Problems:   Essential hypertension   HLD (hyperlipidemia)   Myocardial injury   Type II diabetes mellitus with renal manifestations (HCC)   Chronic kidney disease, stage 3a (HCC)   Leukocytosis   Hyponatremia   Obesity (BMI 30-39.9)  ASSESSMENT AND PLAN: Jodi Chavez is a 72 y.o. female with a past medical history of diastolic congestive heart failure, COVID-related myocarditis, chronic renal sufficiency stage IIIa, hypertension, hyperlipidemia, diabetes, obesity, diabetic neuropathy  who presented to the ED on 08/11/2022 for shortness of breath. Cardiology consulted for further assistance with her CHF. Echo this admission revealed low normal EF 50-55%, mild-moderate aortic stenosis.   # Nausea # constipation - intermittent nausea following a meal that resolved after having a BM.  - Management per primary.   # Acute on Chronic HFpEF # mild - moderate aortic stenosis  Patient presented with worsening SOB and LE edema. Currently on 1L [place] - no oxygen requirement at baseline. Net negative 2.2L since admission. Volume status clinically improving from admission but remains with R base crackles, and small pleural effusions on repeat CXR 6/18 -S/p IV Lasix 60 mg x1 on 6/15, 6/17.  -IV lasix 40 mg this AM, dose another 60mg  IV this PM - Continue GDMT with metoprolol XL 50mg  daily, jardiance 10mg  daily, spironolactone 12.5mg  daily   -- d/c home hydrochlorothiazide at discharge in favor of lasix and d/c home amlodipine in favor of other GDMT titration. Ideally restart benazepril at discharge for GDMT.  - needs to mobilize, into  chair/OOB as tolerated  - appreciate PT/OT assistance   # Demand Ischemia # Hyperlipidemia Patient with central chest discomfort on admission, trops trended 162 (6/15) > 234 > 465 > 627 > 636 (6/16) -Patient continues to deny chest pain this AM.  -S/p heparin infusion x 64 hours. Discontinued today, patient to be put on lovenox for DVT ppx.  -No plan for additional cardiac diagnostics.  -Continue simvastatin and zetia, cholesterol well controlled on lipid panel.   # Chronic Kidney Disease Stage IIIa # Hyponatremia Cr trend 1.34 - 1.50 , GFR 37. -continue to monitor closely.   Cardiology will sign off. Please haiku with questions or re-engage if needed. Will arrange for follow up in clinic with Marijo Conception, NP at [facility] in 1-2 weeks.    This patient's case was discussed and created with Dr. Juliann Pares and he is in agreement.  [address] PA-C 08/16/2022, 2:04 PM [facility]

## 2022-08-16 NOTE — Progress Notes (Signed)
Progress Note    Jodi Chavez  ZOX:[id number] DOB: 1950-07-17  DOA: 08/11/2022 PCP: Fleet Contras, MD      Brief Narrative:    Medical records reviewed and are as summarized below:  Jodi Chavez is a 72 y.o. 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  Resident saturation dropped to 87% on room air and is improved to 97% on 2 L/min oxygen.  Troponins went from 1 62-234 and BNP was 1,096 on admission.   She was admitted to the hospital for acute on chronic diastolic CHF complicated by acute hypoxic respiratory failure.  She was treated with IV Lasix.      Assessment/Plan:   Principal Problem:   Acute on chronic diastolic CHF (congestive heart failure) (HCC) Active Problems:   Myocardial injury   Essential hypertension   Hyponatremia   HLD (hyperlipidemia)   Type II diabetes mellitus with renal manifestations (HCC)   Chronic kidney disease, stage 3a (HCC)   Leukocytosis   Obesity (BMI 30-39.9)   Body mass index is 33.68 kg/m.  (Obesity)   Acute on chronic diastolic CHF: Improving.  IV Lasix has been discontinued.  Plan to start oral Lasix tomorrow.  Follow BMP, daily weight and urine output.  Continue empagliflozin, Toprol and Aldactone.  Follow-up with cardiologist.  2D echo showed EF estimated at 50 to 55%, normal LV diastolic parameters.   Elevated troponins: This is likely due to demand ischemia.  S/p treatment with IV heparin drip.   Acute hypoxic respiratory failure: She is on 2 L/min oxygen via .  Try to wean off oxygen again today.   Nausea: Improved.  IV Zofran and Phenergan as needed.   CKD stage IIIa: Creatinine is stable.   Hyponatremia: Chart review shows patient has chronic  hyponatremia.     Leukocytosis: Improved   Other comorbidities include hypertension, hyperlipidemia, type II DM (hemoglobin A1c 6.4)  Diet Order             Diet heart healthy/carb modified Room service appropriate? Yes; Fluid consistency: Thin; Fluid restriction: 1200 mL Fluid  Diet effective now                            Consultants: Cardiologist  Procedures: None    Medications:    aspirin EC  81 mg Oral Daily   calcium-vitamin D  1 tablet Oral Daily   cloNIDine  0.1 mg Oral QHS   empagliflozin  10 mg Oral Daily   enoxaparin (LOVENOX) injection  40 mg Subcutaneous Q24H   ezetimibe  10 mg Oral QHS   gabapentin  300 mg Oral TID   insulin aspart  0-5 Units Subcutaneous QHS   insulin aspart  0-9 Units Subcutaneous TID WC   metoprolol succinate  50 mg Oral Daily   multivitamin with minerals  1 tablet Oral QPC breakfast   pantoprazole  40 mg Oral Daily   polyethylene glycol  17 g Oral Daily   simvastatin  40 mg Oral QPM   sodium chloride  1 g Oral BID WC   spironolactone  12.5 mg Oral Daily   Continuous Infusions:  promethazine (PHENERGAN) injection (IM or IVPB) 12.5 mg (08/16/22 0645)     Anti-infectives (From admission, onward)  None              Family Communication/Anticipated D/C date and plan/Code Status   DVT prophylaxis: enoxaparin (LOVENOX) injection 40 mg Start: 08/14/22 2200     Code Status: Full Code  Family Communication: None Disposition Plan: Plan to discharge to SNF   Status is: Inpatient Remains inpatient appropriate because: IV Lasix for CHF exacerbation       Subjective:   Interval events noted.  Breathing is better.  She still has a cough.  Objective:    Vitals:   08/16/22 0018 08/16/22 0337 08/16/22 0806 08/16/22 1242  BP: 126/72 129/62 135/70 127/71  Pulse: 88 92 (!) 101 94  Resp: 18 20 18 18   Temp: 98 F (36.7 C) 98.5 F (36.9 C) 98.3 F (36.8 C) 99.6 F (37.6 C)  TempSrc:      SpO2:  93% 96% 95% 97%  Weight:  89 kg    Height:       No data found.   Intake/Output Summary (Last 24 hours) at 08/16/2022 1410 Last data filed at 08/16/2022 1100 Gross per 24 hour  Intake 495 ml  Output 2000 ml  Net -1505 ml   Filed Weights   08/14/22 0418 08/15/22 0328 08/16/22 0337  Weight: 87.7 kg 88.2 kg 89 kg    Exam:  GEN: NAD SKIN: Warm and dry EYES: No pallor or icterus ENT: MMM CV: RRR PULM: No wheezing or rales heard ABD: soft, ND, NT, +BS CNS: AAO x 3, non focal EXT: Bilateral leg edema, no tenderness       Data Reviewed:   I have personally reviewed following labs and imaging studies:  Labs: Labs show the following:   Basic Metabolic Panel: Recent Labs  Lab 08/11/22 2204 08/12/22 0214 08/12/22 1259 08/12/22 2130 08/13/22 1322 08/14/22 0442 08/15/22 0403 08/16/22 0414  NA 130* 132*   < > 132* 130* 129* 132* 134*  K 4.5 4.6   < > 4.7 4.5 4.5 4.5 4.3  CL 91* 93*   < > 95* 92* 92* 91* 91*  CO2 28 30   < > 28 28 28 29 30   GLUCOSE 131* 118*   < > 176* 182* 130* 117* 118*  BUN 24* 23   < > 26* 28* 26* 27* 33*  CREATININE 1.41* 1.34*   < > 1.61* 1.39* 1.34* 1.34* 1.50*  CALCIUM 9.5 9.4   < > 9.1 8.9 9.0 9.3 9.5  MG 1.7 1.7  --   --   --  1.5* 1.9 1.8   < > = values in this interval not displayed.   GFR Estimated Creatinine Clearance: 37.7 mL/min (A) (by C-G formula based on SCr of 1.5 mg/dL (H)). Liver Function Tests: Recent Labs  Lab 08/11/22 1430  AST 29  ALT 17  ALKPHOS 64  BILITOT 0.6  PROT 7.0  ALBUMIN 4.1   No results for input(s): "LIPASE", "AMYLASE" in the last 168 hours. No results for input(s): "AMMONIA" in the last 168 hours. Coagulation profile Recent Labs  Lab 08/11/22 1649  INR 1.0    CBC: Recent Labs  Lab 08/12/22 1040 08/13/22 0352 08/14/22 0442 08/15/22 0403 08/16/22 0414  WBC 9.9 10.4 11.7* 10.9* 12.9*  HGB 12.1 11.4* 11.4* 11.2* 11.3*  HCT 36.3 34.5* 33.1* 33.7* 33.8*  MCV 97.6 98.3 97.1 97.7 97.4  PLT  201 193 214 216 246   Cardiac Enzymes: No results for input(s): "CKTOTAL", "CKMB", "CKMBINDEX", "TROPONINI" in the last 168 hours. BNP (  last 3 results) No results for input(s): "PROBNP" in the last 8760 hours. CBG: Recent Labs  Lab 08/15/22 1257 08/15/22 1743 08/15/22 2151 08/16/22 0808 08/16/22 1244  GLUCAP 146* 125* 142* 142* 128*   D-Dimer: No results for input(s): "DDIMER" in the last 72 hours. Hgb A1c: No results for input(s): "HGBA1C" in the last 72 hours. Lipid Profile: No results for input(s): "CHOL", "HDL", "LDLCALC", "TRIG", "CHOLHDL", "LDLDIRECT" in the last 72 hours. Thyroid function studies: No results for input(s): "TSH", "T4TOTAL", "T3FREE", "THYROIDAB" in the last 72 hours.  Invalid input(s): "FREET3" Anemia work up: No results for input(s): "VITAMINB12", "FOLATE", "FERRITIN", "TIBC", "IRON", "RETICCTPCT" in the last 72 hours. Sepsis Labs: Recent Labs  Lab 08/11/22 1430 08/12/22 1040 08/13/22 0352 08/14/22 0442 08/15/22 0403 08/16/22 0414  PROCALCITON <0.10  --   --   --   --   --   WBC 13.9*   < > 10.4 11.7* 10.9* 12.9*   < > = values in this interval not displayed.    Microbiology Recent Results (from the past 240 hour(s))  SARS Coronavirus 2 by RT PCR (hospital order, performed in [facility] hospital lab) *cepheid single result test* Anterior Nasal Swab     Status: None   Collection Time: 08/11/22  2:45 PM   Specimen: Anterior Nasal Swab  Result Value Ref Range Status   SARS Coronavirus 2 by RT PCR NEGATIVE NEGATIVE Final    Comment: (NOTE) SARS-CoV-2 target nucleic acids are NOT DETECTED.  The SARS-CoV-2 RNA is generally detectable in upper and lower respiratory specimens during the acute phase of infection. The lowest concentration of SARS-CoV-2 viral copies this assay can detect is 250 copies / mL. A negative result does not preclude SARS-CoV-2 infection and should not be used as the sole basis for treatment or other patient management  decisions.  A negative result may occur with improper specimen collection / handling, submission of specimen other than nasopharyngeal swab, presence of viral mutation(s) within the areas targeted by this assay, and inadequate number of viral copies (<250 copies / mL). A negative result must be combined with clinical observations, patient history, and epidemiological information.  Fact Sheet for Patients:   RoadLapTop.co.za  Fact Sheet for Healthcare Providers: http://kim-miller.com/  This test is not yet approved or  cleared by the [place] FDA and has been authorized for detection and/or diagnosis of SARS-CoV-2 by FDA under an Emergency Use Authorization (EUA).  This EUA will remain in effect (meaning this test can be used) for the duration of the COVID-19 declaration under Section 564(b)(1) of the Act, 21 U.S.C. section 360bbb-3(b)(1), unless the authorization is terminated or revoked sooner.  Performed at [facility], [address] [place], [place] [address]     Procedures and diagnostic studies:  No results found.             LOS: 5 days   Jaileigh Weimer  [facility]   Pager on [email or web address]. If 7PM-7AM, please contact night-coverage at [email or web address]     08/16/2022, 2:10 PM

## 2022-08-16 NOTE — Progress Notes (Signed)
Physical Therapy Treatment Patient Details Name: Jodi Chavez MRN: [id number] DOB: 08-24-1950 Today's Date: 08/16/2022   History of Present Illness Pt is a 72 y/o F admitted on 08/11/22 after presenting with c/o SOB. Pt is being treated for acute on chronic diastolic CHF. PMH: dCHF, myocarditis 2/2 COVID, CKD3A, HTN, HLD, DM, depression, migraine HA, diabetic neuropathy, obesity    PT Comments    Patient received in bed. She requires encouragement to participate and do for herself. Patient requires mod/max +2 for bed mobility. She is able to stand with +2 min A. Cues for safety, hand placement. Patient transferred to [facility] and then ambulated a few feet to recliner with RW. She needs cues for pursed lip breathing. Increased time needed for all mobility this session. She will continue to benefit from skilled PT to improve strength and independence.           Recommendations for follow up therapy are one component of a multi-disciplinary discharge planning process, led by the attending physician.  Recommendations may be updated based on patient status, additional functional criteria and insurance authorization.  Follow Up Recommendations  Can patient physically be transported by private vehicle: No    Assistance Recommended at Discharge Intermittent Supervision/Assistance  Patient can return home with the following A lot of help with walking and/or transfers;A lot of help with bathing/dressing/bathroom;Help with stairs or ramp for entrance;Assist for transportation;Assistance with cooking/housework   Equipment Recommendations  None recommended by PT (TBD next venue)    Recommendations for Other Services       Precautions / Restrictions Precautions Precautions: Fall Restrictions Weight Bearing Restrictions: No     Mobility  Bed Mobility Overal bed mobility: Needs Assistance Bed Mobility: Supine to Sit     Supine to sit: Mod assist, +2 for physical assistance, Max assist, HOB  elevated     General bed mobility comments: incr time/effort, increased assist required    Transfers Overall transfer level: Needs assistance Equipment used: Rolling walker (2 wheels) Transfers: Sit to/from Stand Sit to Stand: Min assist, +2 physical assistance           General transfer comment: VC for hand and feet placement    Ambulation/Gait Ambulation/Gait assistance: Min assist Gait Distance (Feet): 4 Feet Assistive device: Rolling walker (2 wheels) Gait Pattern/deviations: Step-to pattern, Decreased step length - right, Decreased step length - left, Shuffle, Trunk flexed Gait velocity: decreased     General Gait Details: Patient with slow small steps, Cues for safety, posture, technique.   Stairs             Wheelchair Mobility    Modified Rankin (Stroke Patients Only)       Balance Overall balance assessment: Needs assistance Sitting-balance support: Feet supported Sitting balance-Leahy Scale: Good     Standing balance support: Bilateral upper extremity supported, During functional activity, Reliant on assistive device for balance Standing balance-Leahy Scale: Fair Standing balance comment: reliant on B UE support and min A                            Cognition Arousal/Alertness: Awake/alert Behavior During Therapy: WFL for tasks assessed/performed Overall Cognitive Status: No family/caregiver present to determine baseline cognitive functioning                                 General Comments: easily distracted requiring VC to redirect, encouragement to  participate        Exercises      General Comments General comments (skin integrity, edema, etc.): SpO2 88-93% on 2L, improves with VC for PLB      Pertinent Vitals/Pain Pain Assessment Pain Assessment: 0-10 Pain Score: 3  Pain Location: BLE Pain Descriptors / Indicators: Discomfort, Aching Pain Intervention(s): Monitored during session, Repositioned     Home Living                          Prior Function            PT Goals (current goals can now be found in the care plan section) Acute Rehab PT Goals Patient Stated Goal: get stronger PT Goal Formulation: With patient Time For Goal Achievement: 08/28/22 Potential to Achieve Goals: Good Progress towards PT goals: Progressing toward goals    Frequency    Min 3X/week      PT Plan Current plan remains appropriate    Co-evaluation PT/OT/SLP Co-Evaluation/Treatment: Yes Reason for Co-Treatment: To address functional/ADL transfers;For patient/therapist safety PT goals addressed during session: Mobility/safety with mobility;Balance;Proper use of DME OT goals addressed during session: ADL's and self-care;Proper use of Adaptive equipment and DME      AM-PAC PT "6 Clicks" Mobility   Outcome Measure  Help needed turning from your back to your side while in a flat bed without using bedrails?: A Lot Help needed moving from lying on your back to sitting on the side of a flat bed without using bedrails?: A Lot Help needed moving to and from a bed to a chair (including a wheelchair)?: A Lot Help needed standing up from a chair using your arms (e.g., wheelchair or bedside chair)?: A Lot Help needed to walk in hospital room?: A Lot Help needed climbing 3-5 steps with a railing? : Total 6 Click Score: 11    End of Session Equipment Utilized During Treatment: Gait belt;Oxygen Activity Tolerance: Patient tolerated treatment well Patient left: in chair;with chair alarm set Nurse Communication: Mobility status PT Visit Diagnosis: Muscle weakness (generalized) (M62.81);Difficulty in walking, not elsewhere classified (R26.2)     Time: 1610-9604 PT Time Calculation (min) (ACUTE ONLY): 23 min  Charges:  $Gait Training: 8-22 mins                     Albin Duckett, PT, GCS 08/16/22,12:33 PM

## 2022-08-16 NOTE — Progress Notes (Signed)
Occupational Therapy Treatment Patient Details Name: Jodi Chavez MRN: [id number] DOB: Jul 17, 1950 Today's Date: 08/16/2022   History of present illness Pt is a 72 y/o F admitted on 08/11/22 after presenting with c/o SOB. Pt is being treated for acute on chronic diastolic CHF. 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 Pt required MOD-MAX A +2 for bed mobility, MIN A +2 for STS and short step pivot to [facility], a few steps from BSC>recliner, with VC throughout for sequencing and hands/feet placement. MAX A for pericare in standing and clothing mgt. Pt very chatty and easily distracted requiring cues to redirect to task. SpO2 88-93%, better with VC for PLB after additional instruction. Continue with POC.    Recommendations for follow up therapy are one component of a multi-disciplinary discharge planning process, led by the attending physician.  Recommendations may be updated based on patient status, additional functional criteria and insurance authorization.    Assistance Recommended at Discharge Intermittent Supervision/Assistance  Patient can return home with the following  Assistance with cooking/housework;Assist for transportation;Help with stairs or ramp for entrance;A lot of help with walking and/or transfers;A lot of help with bathing/dressing/bathroom   Equipment Recommendations  Other (comment) (defer to next venue)    Recommendations for Other Services      Precautions / Restrictions Precautions Precautions: Fall Restrictions Weight Bearing Restrictions: No       Mobility Bed Mobility Overal bed mobility: Needs Assistance Bed Mobility: Supine to Sit     Supine to sit: Mod assist, Max assist, +2 for physical assistance     General bed mobility comments: incr time/effort, increased assist required    Transfers Overall transfer  level: Needs assistance Equipment used: Rolling walker (2 wheels) Transfers: Sit to/from Stand Sit to Stand: Min assist, +2 physical assistance           General transfer comment: VC for hand and feet placement     Balance   Sitting-balance support: Feet supported, Single extremity supported Sitting balance-Leahy Scale: Fair     Standing balance support: During functional activity, Bilateral upper extremity supported, Reliant on assistive device for balance Standing balance-Leahy Scale: Fair                             ADL either performed or assessed with clinical judgement   ADL Overall ADL's : Needs assistance/impaired                     Lower Body Dressing: Maximal assistance;Bed level Lower Body Dressing Details (indicate cue type and reason): MAX A don socks Toilet Transfer: BSC/3in1;Rolling walker (2 wheels);Ambulation;Min guard;+2 for physical assistance Toilet Transfer Details (indicate cue type and reason): MAX VC for sequencing, RW mgt, and PLB                Extremity/Trunk Assessment              Vision       Perception     Praxis      Cognition Arousal/Alertness: Awake/alert Behavior During Therapy: WFL for tasks assessed/performed Overall Cognitive Status: No family/caregiver present to determine baseline cognitive functioning  General Comments: easily distracted requiring VC to redirect, encouragement to participate        Exercises Other Exercises Other Exercises: pt instructed in PLB    Shoulder Instructions       General Comments SpO2 88-93% on 2L, improves with VC for PLB    Pertinent Vitals/ Pain       Pain Assessment Pain Assessment: 0-10 Pain Score: 3  Pain Location: BLE Pain Descriptors / Indicators: Aching Pain Intervention(s): Limited activity within patient's tolerance, Monitored during session, Repositioned  Home Living                                           Prior Functioning/Environment              Frequency  Min 2X/week        Progress Toward Goals  OT Goals(current goals can now be found in the care plan section)  Progress towards OT goals: Progressing toward goals  Acute Rehab OT Goals Patient Stated Goal: to go home OT Goal Formulation: With patient Time For Goal Achievement: 08/28/22 Potential to Achieve Goals: Fair  Plan Discharge plan remains appropriate;Frequency remains appropriate    Co-evaluation    PT/OT/SLP Co-Evaluation/Treatment: Yes Reason for Co-Treatment: To address functional/ADL transfers;For patient/therapist safety PT goals addressed during session: Mobility/safety with mobility;Proper use of DME;Balance OT goals addressed during session: ADL's and self-care;Proper use of Adaptive equipment and DME      AM-PAC OT "6 Clicks" Daily Activity     Outcome Measure   Help from another person eating meals?: None Help from another person taking care of personal grooming?: A Little Help from another person toileting, which includes using toliet, bedpan, or urinal?: A Lot Help from another person bathing (including washing, rinsing, drying)?: A Lot Help from another person to put on and taking off regular upper body clothing?: None Help from another person to put on and taking off regular lower body clothing?: A Lot 6 Click Score: 17    End of Session Equipment Utilized During Treatment: Rolling walker (2 wheels)  OT Visit Diagnosis: Unsteadiness on feet (R26.81);Repeated falls (R29.6);Muscle weakness (generalized) (M62.81)   Activity Tolerance Patient tolerated treatment well   Patient Left in chair;with call bell/phone within reach;with chair alarm set   Nurse Communication          Time: 1130-1155 OT Time Calculation (min): 25 min  Charges: OT General Charges $OT Visit: 1 Visit OT Treatments $Self Care/Home Management : 8-22 mins  Arman Filter., MPH, MS,  OTR/L ascom [phone] 08/16/22, 12:03 PM

## 2022-08-16 NOTE — TOC Progression Note (Addendum)
Transition of Care [facility]) - Progression Note    Patient Details  Name: Jodi Chavez MRN: [id number] Date of Birth: Apr 20, 1950  [facility] CM/SW Contact  Margarito Liner, LCSW Phone Number: 08/16/2022, 11:36 AM  Clinical Narrative:  [name] has not responded to referral  yet but this is patient's first preference. CSW left message for admissions coordinator asking her to review. If they cannot accept her, patient wants to accept offer from [facility].   4:14 pm: [name] offered a bed. Left message for admissions coordinator to see if they will have a bed tomorrow if she's stable.  Expected Discharge Plan: Skilled Nursing Facility Barriers to Discharge: Continued Medical Work up  Expected Discharge Plan and Services     Post Acute Care Choice: Skilled Nursing Facility Living arrangements for the past 2 months: Single Family Home                                       Social Determinants of Health (SDOH) Interventions SDOH Screenings   Food Insecurity: No Food Insecurity (08/12/2022)  Housing: Low Risk  (08/12/2022)  Transportation Needs: No Transportation Needs (08/12/2022)  Utilities: Not At Risk (08/12/2022)  Tobacco Use: Low Risk  (08/11/2022)    Readmission Risk Interventions     No data to display

## 2022-08-17 DIAGNOSIS — I5033 Acute on chronic diastolic (congestive) heart failure: Secondary | ICD-10-CM | POA: Diagnosis not present

## 2022-08-17 LAB — GLUCOSE, CAPILLARY
Glucose-Capillary: 117 mg/dL — ABNORMAL HIGH (ref 70–99)
Glucose-Capillary: 154 mg/dL — ABNORMAL HIGH (ref 70–99)

## 2022-08-17 MED ORDER — SPIRONOLACTONE 25 MG PO TABS: 12.5000 mg | ORAL_TABLET | Freq: Every day | ORAL | Status: DC

## 2022-08-17 MED ORDER — HYDROCOD POLI-CHLORPHE POLI ER 10-8 MG/5ML PO SUER
5.0000 mL | Freq: Two times a day (BID) | ORAL | Status: DC | PRN
  Administered 2022-08-17: 5 mL via ORAL
  Filled 2022-08-17: qty 5

## 2022-08-17 MED ORDER — EMPAGLIFLOZIN 10 MG PO TABS: 10.0000 mg | ORAL_TABLET | Freq: Every day | ORAL | Status: DC

## 2022-08-17 MED ORDER — FUROSEMIDE 10 MG/ML IJ SOLN
20.0000 mg | Freq: Once | INTRAMUSCULAR | Status: AC
  Administered 2022-08-17: 20 mg via INTRAVENOUS
  Filled 2022-08-17: qty 2

## 2022-08-17 MED ORDER — FUROSEMIDE 20 MG PO TABS: 20.0000 mg | ORAL_TABLET | Freq: Every day | ORAL | Status: DC

## 2022-08-17 NOTE — TOC Progression Note (Addendum)
Transition of Care [facility]) - Progression Note    Patient Details  Name: Jodi Chavez MRN: [id number] Date of Birth: 12-02-1950  Transition of Care [facility]) CM/SW Contact  Liliana Cline, LCSW Phone Number: 08/17/2022, 10:25 AM  Clinical Narrative:    [name] at [facility] has a bed for this patient today. If not medically ready today, will have to be Monday due to no weekend admissions. MD updated.   11:15- Notified MD that [name] will need DC Summary by 2pm if patient is going today.    Expected Discharge Plan: Skilled Nursing Facility Barriers to Discharge: Continued Medical Work up  Expected Discharge Plan and Services     Post Acute Care Choice: Skilled Nursing Facility Living arrangements for the past 2 months: Single Family Home                                       Social Determinants of Health (SDOH) Interventions SDOH Screenings   Food Insecurity: No Food Insecurity (08/12/2022)  Housing: Low Risk  (08/12/2022)  Transportation Needs: No Transportation Needs (08/12/2022)  Utilities: Not At Risk (08/12/2022)  Tobacco Use: Low Risk  (08/11/2022)    Readmission Risk Interventions     No data to display

## 2022-08-17 NOTE — Discharge Summary (Signed)
Physician Discharge Summary   Patient: Jodi Chavez MRN: [id number] DOB: 1950/10/24  Admit date:     08/11/2022  Discharge date: 08/17/22  Discharge Physician: Lurene Shadow   PCP: Fleet Contras, MD   Recommendations at discharge:   Follow-up with Marijo Conception, NP at [facility] in 1-2 weeks Follow-up with your physician at the nursing home within 3 days of discharge   Discharge Diagnoses: Principal Problem:   Acute on chronic diastolic CHF (congestive heart failure) (HCC) Active Problems:   Myocardial injury   Essential hypertension   Hyponatremia   HLD (hyperlipidemia)   Type II diabetes mellitus with renal manifestations (HCC)   Chronic kidney disease, stage 3a (HCC)   Leukocytosis   Obesity (BMI 30-39.9)  Resolved Problems:   * No resolved hospital problems. [facility] Course:  Jodi Chavez is a 72 y.o. 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  Resident saturation dropped to 87% on room air and is improved to 97% on 2 L/min oxygen.  Troponins went from 1 62-234 and BNP was 1,096 on admission.     She was admitted to the hospital for acute on chronic diastolic CHF complicated by acute hypoxic respiratory failure.  She was treated with IV Lasix.     Assessment and Plan:   Acute on chronic diastolic CHF: Improved.  She will be discharged on oral Lasix, Aldactone, benazepril, Toprol-XL and empagliflozin.  2D echo showed EF estimated at 50 to 55%, normal LV diastolic parameters.     Elevated troponins: This is likely due to demand ischemia.  S/p treatment with IV heparin drip.     Acute hypoxic respiratory failure: Resolved.  She is tolerating room air.       Nausea: Improved.     CKD stage  IIIa: Creatinine is stable.     Hyponatremia: Chart review shows patient has chronic hyponatremia.       Leukocytosis: Improved     Other comorbidities include hypertension, hyperlipidemia, type II DM (hemoglobin A1c 6.4)   Her condition has improved and she is deemed stable for discharge to SNF today.         Consultants: Cardiologist Procedures performed: None Disposition: Skilled nursing facility Diet recommendation:  Cardiac and Carb modified diet DISCHARGE MEDICATION: Allergies as of 08/17/2022       Reactions   Naproxen Sodium Rash   anaprox        Medication List     STOP taking these medications    amLODipine 5 MG tablet Commonly known as: NORVASC   cloNIDine 0.1 MG tablet Commonly known as: CATAPRES       TAKE these medications    ascorbic acid 500 MG tablet Commonly known as: VITAMIN C Take 500 mg by mouth daily.   aspirin EC 81 MG tablet Take 81 mg by mouth daily.   benazepril 40 MG tablet Commonly known as: LOTENSIN Take 40 mg by mouth daily.   calcium-vitamin D 500-200 MG-UNIT Tabs tablet Commonly known as: OSCAL WITH D Take 1 tablet by mouth daily.   empagliflozin 10 MG Tabs tablet Commonly known as: JARDIANCE Take 1 tablet (10 mg total) by mouth daily. Start taking on: August 18, 2022   ezetimibe 10 MG tablet Commonly known as: ZETIA Take 10 mg by mouth at  bedtime.   furosemide 20 MG tablet Commonly known as: LASIX Take 1 tablet (20 mg total) by mouth daily. What changed:  when to take this reasons to take this   gabapentin 300 MG capsule Commonly known as: NEURONTIN Take 300 mg by mouth 3 (three) times daily.   Garlic 1000 MG Caps Take 5,000 mg by mouth daily after breakfast.   Glucosamine-Chondroitin 500-400 MG Caps Take 1 tablet by mouth daily.   metFORMIN 850 MG tablet Commonly known as: GLUCOPHAGE Take 850 mg by mouth daily with breakfast.   metoprolol succinate 50 MG 24 hr tablet Commonly known as:  TOPROL-XL Take 50 mg by mouth daily.   multivitamin with minerals Tabs tablet Take 1 tablet by mouth daily after breakfast.   pantoprazole 40 MG tablet Commonly known as: PROTONIX Take 1 tablet (40 mg total) by mouth daily.   promethazine 25 MG tablet Commonly known as: PHENERGAN Take 1 tablet (25 mg total) by mouth every 6 (six) hours as needed for nausea.   simvastatin 40 MG tablet Commonly known as: ZOCOR Take 40 mg by mouth every evening.   spironolactone 25 MG tablet Commonly known as: ALDACTONE Take 0.5 tablets (12.5 mg total) by mouth daily. Start taking on: August 18, 2022   zinc sulfate 220 (50 Zn) MG capsule Take 220 mg by mouth daily.        Follow-up Information     Loistine Chance, NP. Go in 1 week(s).   Specialty: Nurse Practitioner Contact information: [address] [place] [address] [phone]                Discharge Exam: Ceasar Mons Weights   08/15/22 0328 08/16/22 0337 08/17/22 0401  Weight: 88.2 kg 89 kg 88.6 kg   GEN: NAD SKIN: Warm and dry EYES: No pallor or icterus ENT: MMM CV: RRR PULM: Occasional wheezing.  No rales heard. ABD: soft, obese, NT, +BS CNS: AAO x 3, non focal EXT: Mild bilateral leg edema, no erythema or tenderness   Condition at discharge: good  The results of significant diagnostics from this hospitalization (including imaging, microbiology, ancillary and laboratory) are listed below for reference.   Imaging Studies: DG Chest 2 View  Result Date: 08/14/2022 CLINICAL DATA:  Shortness of breath. EXAM: CHEST - 2 VIEW COMPARISON:  August 11, 2022. FINDINGS: Stable cardiomediastinal silhouette. Mild diffuse interstitial densities are noted throughout both lungs which may represent pulmonary edema. Hypoinflation of the lungs is noted. Small pleural effusions are present. Bony thorax is unremarkable. IMPRESSION: Hypoinflation of the lungs. Probable mild bilateral pulmonary edema with small pleural effusions.  Electronically Signed   By: Lupita Raider M.D.   On: 08/14/2022 15:02   ECHOCARDIOGRAM COMPLETE  Result Date: 08/12/2022    ECHOCARDIOGRAM REPORT   Patient Name:   Jodi Chavez Date of Exam: 08/12/2022 Medical Rec #:  [id number]      Height:       64.0 in Accession #:    [id number]     Weight:       191.0 lb Date of Birth:  [birth year]      BSA:          1.918 m Patient Age:    71 years       BP:           108/59 mmHg Patient Gender: F              HR:  79 bpm. Exam Location:  [facility] Procedure: 2D Echo and Intracardiac Opacification Agent Indications:     CHF I50.31  History:         Patient has prior history of Echocardiogram examinations, most                  recent 10/08/2019.  Sonographer:     Overton Mam RDCS, FASE Referring Phys:  Wynona Neat NIU Diagnosing Phys: Alwyn Pea MD  Sonographer Comments: Technically difficult study due to poor echo windows, suboptimal apical window, no subcostal window and patient is obese. Image acquisition challenging due to COPD and Image acquisition challenging due to respiratory motion. IMPRESSIONS  1. Left ventricular ejection fraction, by estimation, is 50 to 55%. The left ventricle has low normal function. The left ventricle demonstrates regional wall motion abnormalities (see scoring diagram/findings for description). Left ventricular diastolic  parameters were normal.  2. Right ventricular systolic function is normal. The right ventricular size is normal.  3. The mitral valve is normal in structure. Trivial mitral valve regurgitation.  4. The aortic valve is grossly normal. Aortic valve regurgitation is not visualized. Moderate aortic valve stenosis. FINDINGS  Left Ventricle: Apical septal hypokinesis. Left ventricular ejection fraction, by estimation, is 50 to 55%. The left ventricle has low normal function. The left ventricle demonstrates regional wall motion abnormalities. Definity contrast agent was given  IV to delineate the left ventricular  endocardial borders. The left ventricular internal cavity size was normal in size. There is borderline left ventricular hypertrophy. Left ventricular diastolic parameters were normal. Right Ventricle: The right ventricular size is normal. No increase in right ventricular wall thickness. Right ventricular systolic function is normal. Left Atrium: Left atrial size was normal in size. Right Atrium: Right atrial size was normal in size. Pericardium: There is no evidence of pericardial effusion. Mitral Valve: The mitral valve is normal in structure. Trivial mitral valve regurgitation. Tricuspid Valve: The tricuspid valve is normal in structure. Tricuspid valve regurgitation is mild. Aortic Valve: The aortic valve is grossly normal. Aortic valve regurgitation is not visualized. Moderate aortic stenosis is present. Aortic valve mean gradient measures 11.0 mmHg. Aortic valve peak gradient measures 9.5 mmHg. Aortic valve area, by VTI measures 0.97 cm. Pulmonic Valve: The pulmonic valve was normal in structure. Pulmonic valve regurgitation is not visualized. Aorta: The ascending aorta was not well visualized. IAS/Shunts: No atrial level shunt detected by color flow Doppler.  LEFT VENTRICLE PLAX 2D LVIDd:         4.60 cm      Diastology LVIDs:         3.30 cm      LV e' medial:    9.68 cm/s LV PW:         1.10 cm      LV E/e' medial:  12.7 LV IVS:        0.95 cm      LV e' lateral:   7.07 cm/s LVOT diam:     1.90 cm      LV E/e' lateral: 17.4 LV SV:         49 LV SV Index:   26 LVOT Area:     2.84 cm  LV Volumes (MOD) LV vol d, MOD A2C: 112.0 ml LV vol d, MOD A4C: 85.5 ml LV vol s, MOD A2C: 44.1 ml LV vol s, MOD A4C: 37.3 ml LV SV MOD A2C:     67.9 ml LV SV MOD A4C:     85.5  ml LV SV MOD BP:      54.8 ml RIGHT VENTRICLE RV Basal diam:  2.80 cm RV S prime:     14.10 cm/s LEFT ATRIUM            Index        RIGHT ATRIUM          Index LA diam:      3.90 cm  2.03 cm/m   RA Area:     8.39 cm LA Vol (A2C): 102.0 ml 53.17 ml/m   RA Volume:   16.60 ml 8.65 ml/m  AORTIC VALVE                     PULMONIC VALVE AV Area (Vmax):    1.56 cm      PV Vmax:        0.90 m/s AV Area (Vmean):   0.99 cm      PV Peak grad:   3.2 mmHg AV Area (VTI):     0.97 cm      RVOT Peak grad: 3 mmHg AV Vmax:           154.20 cm/s AV Vmean:          157.000 cm/s AV VTI:            0.507 m AV Peak Grad:      9.5 mmHg AV Mean Grad:      11.0 mmHg LVOT Vmax:         84.90 cm/s LVOT Vmean:        54.700 cm/s LVOT VTI:          0.174 m LVOT/AV VTI ratio: 0.34  AORTA Ao Root diam: 2.50 cm Ao Asc diam:  3.10 cm MITRAL VALVE MV Area (PHT): 4.21 cm     SHUNTS MV Decel Time: 180 msec     Systemic VTI:  0.17 m MV E velocity: 123.00 cm/s  Systemic Diam: 1.90 cm MV A velocity: 124.00 cm/s MV E/A ratio:  0.99 Dwayne D Callwood MD Electronically signed by Alwyn Pea MD Signature Date/Time: 08/12/2022/12:48:34 PM    Final    DG Chest 2 View  Result Date: 08/11/2022 CLINICAL DATA:  Shortness of breath EXAM: CHEST - 2 VIEW COMPARISON:  Chest x-ray 05/27/2022 FINDINGS: Central there are minimal hazy and interstitial opacities in the left lung base. Costophrenic angles are clear. No pneumothorax. Cardiomediastinal silhouette is within normal limits. No fractures are identified. IMPRESSION: Minimal hazy and interstitial opacities in the left lung base, atelectasis versus infection. Electronically Signed   By: Darliss Cheney M.D.   On: 08/11/2022 15:07    Microbiology: Results for orders placed or performed during the hospital encounter of 08/11/22  SARS Coronavirus 2 by RT PCR (hospital order, performed in [facility] hospital lab) *cepheid single result test* Anterior Nasal Swab     Status: None   Collection Time: 08/11/22  2:45 PM   Specimen: Anterior Nasal Swab  Result Value Ref Range Status   SARS Coronavirus 2 by RT PCR NEGATIVE NEGATIVE Final    Comment: (NOTE) SARS-CoV-2 target nucleic acids are NOT DETECTED.  The SARS-CoV-2 RNA is generally detectable in  upper and lower respiratory specimens during the acute phase of infection. The lowest concentration of SARS-CoV-2 viral copies this assay can detect is 250 copies / mL. A negative result does not preclude SARS-CoV-2 infection and should not be used as the sole basis for treatment or other patient management decisions.  A negative result may occur with improper specimen collection / handling, submission of specimen other than nasopharyngeal swab, presence of viral mutation(s) within the areas targeted by this assay, and inadequate number of viral copies (<250 copies / mL). A negative result must be combined with clinical observations, patient history, and epidemiological information.  Fact Sheet for Patients:   RoadLapTop.co.za  Fact Sheet for Healthcare Providers: http://kim-miller.com/  This test is not yet approved or  cleared by the [place] FDA and has been authorized for detection and/or diagnosis of SARS-CoV-2 by FDA under an Emergency Use Authorization (EUA).  This EUA will remain in effect (meaning this test can be used) for the duration of the COVID-19 declaration under Section 564(b)(1) of the Act, 21 U.S.C. section 360bbb-3(b)(1), unless the authorization is terminated or revoked sooner.  Performed at [facility], [address] [place], [place] [address]     Labs: CBC: Recent Labs  Lab 08/12/22 1040 08/13/22 0352 08/14/22 0442 08/15/22 0403 08/16/22 0414  WBC 9.9 10.4 11.7* 10.9* 12.9*  HGB 12.1 11.4* 11.4* 11.2* 11.3*  HCT 36.3 34.5* 33.1* 33.7* 33.8*  MCV 97.6 98.3 97.1 97.7 97.4  PLT 201 193 214 216 246   Basic Metabolic Panel: Recent Labs  Lab 08/11/22 2204 08/12/22 0214 08/12/22 1259 08/12/22 2130 08/13/22 1322 08/14/22 0442 08/15/22 0403 08/16/22 0414  NA 130* 132*   < > 132* 130* 129* 132* 134*  K 4.5 4.6   < > 4.7 4.5 4.5 4.5 4.3  CL 91* 93*   < > 95* 92* 92* 91* 91*  CO2 28 30    < > 28 28 28 29 30   GLUCOSE 131* 118*   < > 176* 182* 130* 117* 118*  BUN 24* 23   < > 26* 28* 26* 27* 33*  CREATININE 1.41* 1.34*   < > 1.61* 1.39* 1.34* 1.34* 1.50*  CALCIUM 9.5 9.4   < > 9.1 8.9 9.0 9.3 9.5  MG 1.7 1.7  --   --   --  1.5* 1.9 1.8   < > = values in this interval not displayed.   Liver Function Tests: Recent Labs  Lab 08/11/22 1430  AST 29  ALT 17  ALKPHOS 64  BILITOT 0.6  PROT 7.0  ALBUMIN 4.1   CBG: Recent Labs  Lab 08/16/22 1244 08/16/22 1635 08/16/22 2103 08/17/22 0848 08/17/22 1214  GLUCAP 128* 153* 158* 117* 154*    Discharge time spent: greater than 30 minutes.  Signed: Lurene Shadow, MD [facility] 08/17/2022

## 2022-08-17 NOTE — TOC Transition Note (Addendum)
Transition of Care [facility]) - CM/SW Discharge Note   Patient Details  Name: Jodi Chavez MRN: [id number] Date of Birth: 1950/11/22  Transition of Care [facility]) CM/SW Contact:  Liliana Cline, LCSW Phone Number: 08/17/2022, 1:46 PM   Clinical Narrative:    Discharge to [facility] today. Room 511. Confirmed with Admissions Worker Tiffany. Updated MD, RN, and patient. Patient says she notified her sister. Asked RN to call report. EMS paperwork completed. [facility] called for transport to [name] - Room 511. Patient is 5th on the list. RN notified.     Final next level of care: Skilled Nursing Facility Barriers to Discharge: Barriers Resolved   Patient Goals and CMS Choice CMS Medicare.gov Compare Post Acute Care list provided to:: Patient Choice offered to / list presented to : Patient  Discharge Placement                Patient chooses bed at: [facility] Patient to be transferred to facility by: [facility] Name of family member notified: patient Patient and family notified of of transfer: 08/17/22  Discharge Plan and Services Additional resources added to the After Visit Summary for       Post Acute Care Choice: Skilled Nursing Facility                               Social Determinants of Health (SDOH) Interventions SDOH Screenings   Food Insecurity: No Food Insecurity (08/12/2022)  Housing: Low Risk  (08/12/2022)  Transportation Needs: No Transportation Needs (08/12/2022)  Utilities: Not At Risk (08/12/2022)  Tobacco Use: Low Risk  (08/11/2022)     Readmission Risk Interventions     No data to display

## 2022-08-17 NOTE — Care Management Important Message (Signed)
Important Message  Patient Details  Name: Jodi Chavez MRN: [id number] Date of Birth: 1950-07-13   Medicare Important Message Given:  Yes     Johnell Comings 08/17/2022, 10:06 AM

## 2022-08-27 ENCOUNTER — Other Ambulatory Visit
Admission: RE | Admit: 2022-08-27 | Discharge: 2022-08-27 | Disposition: A | Discharge status: Home / Self Care | Payer: Medicare Other | Source: Ambulatory Visit | Attending: Nurse Practitioner | Admitting: Nurse Practitioner

## 2022-08-27 DIAGNOSIS — I5033 Acute on chronic diastolic (congestive) heart failure: Secondary | ICD-10-CM | POA: Insufficient documentation

## 2022-08-27 LAB — BRAIN NATRIURETIC PEPTIDE: B Natriuretic Peptide: 1299.1 pg/mL — ABNORMAL HIGH (ref 0.0–100.0)

## 2022-08-31 NOTE — Progress Notes (Deleted)
Advanced Heart Failure Clinic Note   Referring Physician: PCP: Fleet Contras, MD PCP-Cardiologist: Dorothyann Peng, MD (last seen 07/24)  HPI:  Jodi Chavez is a 72 y/o female with a history of  Admitted 08/11/22 due to chest discomfort, shortness of breath, orthopnea and bilateral leg edema. Oxygen saturation dropped to 87% on room air and is improved to 97% on 2 L/min oxygen. Given IV lasix with transition to oral diuretics.   Echo 10/07/22: EF 50-55%. Echo 08/12/22: EF 50-55% along with trivial MR.   She presents today for her initial visit with a chief complaint of   Review of Systems: [y] = yes, [ ]  = no   General: Weight gain [ ] ; Weight loss [ ] ; Anorexia [ ] ; Fatigue [ ] ; Fever [ ] ; Chills [ ] ; Weakness [ ]   Cardiac: Chest pain/pressure [ ] ; Resting SOB [ ] ; Exertional SOB [ ] ; Orthopnea [ ] ; Pedal Edema [ ] ; Palpitations [ ] ; Syncope [ ] ; Presyncope [ ] ; Paroxysmal nocturnal dyspnea[ ]   Pulmonary: Cough [ ] ; Wheezing[ ] ; Hemoptysis[ ] ; Sputum [ ] ; Snoring [ ]   GI: Vomiting[ ] ; Dysphagia[ ] ; Melena[ ] ; Hematochezia [ ] ; Heartburn[ ] ; Abdominal pain [ ] ; Constipation [ ] ; Diarrhea [ ] ; BRBPR [ ]   GU: Hematuria[ ] ; Dysuria [ ] ; Nocturia[ ]   Vascular: Pain in legs with walking [ ] ; Pain in feet with lying flat [ ] ; Non-healing sores [ ] ; Stroke [ ] ; TIA [ ] ; Slurred speech [ ] ;  Neuro: Headaches[ ] ; Vertigo[ ] ; Seizures[ ] ; Paresthesias[ ] ;Blurred vision [ ] ; Diplopia [ ] ; Vision changes [ ]   Ortho/Skin: Arthritis [ ] ; Joint pain [ ] ; Muscle pain [ ] ; Joint swelling [ ] ; Back Pain [ ] ; Rash [ ]   Psych: Depression[ ] ; Anxiety[ ]   Heme: Bleeding problems [ ] ; Clotting disorders [ ] ; Anemia [ ]   Endocrine: Diabetes [ ] ; Thyroid dysfunction[ ]    Past Medical History:  Diagnosis Date   Arthritis    hip   CHF (congestive heart failure) (HCC)    Constipation    uses laxatives several times a week   Depression    Diabetes mellitus    takes Metformin and Amaryl daily   Dizziness     occasionally and related to meds    Early cataracts, bilateral    History of bronchitis    last time several yrs ago   History of migraine    many yrs ago   Hyperlipidemia    takes Zetia and Zocor daily   Hypertension    takes Benazepril nightly and Propranolol tid and Clonidine daily   Insomnia    Low back pain    Peripheral edema    Peripheral neuropathy    PONV (postoperative nausea and vomiting)    Slow urinary stream    occasionally    Current Outpatient Medications  Medication Sig Dispense Refill   ascorbic acid (VITAMIN C) 500 MG tablet Take 500 mg by mouth daily.     aspirin 81 MG EC tablet Take 81 mg by mouth daily.     benazepril (LOTENSIN) 40 MG tablet Take 40 mg by mouth daily.     calcium-vitamin D (OSCAL WITH D) 500-200 MG-UNIT TABS tablet Take 1 tablet by mouth daily.     empagliflozin (JARDIANCE) 10 MG TABS tablet Take 1 tablet (10 mg total) by mouth daily.     ezetimibe (ZETIA) 10 MG tablet Take 10 mg by mouth at bedtime.     furosemide (LASIX) 20  MG tablet Take 1 tablet (20 mg total) by mouth daily.     gabapentin (NEURONTIN) 300 MG capsule Take 300 mg by mouth 3 (three) times daily.     Garlic 1000 MG CAPS Take 5,000 mg by mouth daily after breakfast.      Glucosamine-Chondroitin 500-400 MG CAPS Take 1 tablet by mouth daily.     metFORMIN (GLUCOPHAGE) 850 MG tablet Take 850 mg by mouth daily with breakfast.     metoprolol succinate (TOPROL-XL) 50 MG 24 hr tablet Take 50 mg by mouth daily.     Multiple Vitamin (MULITIVITAMIN WITH MINERALS) TABS Take 1 tablet by mouth daily after breakfast.      pantoprazole (PROTONIX) 40 MG tablet Take 1 tablet (40 mg total) by mouth daily. 30 tablet 2   promethazine (PHENERGAN) 25 MG tablet Take 1 tablet (25 mg total) by mouth every 6 (six) hours as needed for nausea. 12 tablet 0   simvastatin (ZOCOR) 40 MG tablet Take 40 mg by mouth every evening.     spironolactone (ALDACTONE) 25 MG tablet Take 0.5 tablets (12.5 mg total)  by mouth daily.     zinc sulfate 220 (50 Zn) MG capsule Take 220 mg by mouth daily.     No current facility-administered medications for this visit.    Allergies  Allergen Reactions   Naproxen Sodium Rash    anaprox      Social History   Socioeconomic History   Marital status: Single    Spouse name: Not on file   Number of children: Not on file   Years of education: Not on file   Highest education level: Not on file  Occupational History   Not on file  Tobacco Use   Smoking status: Never   Smokeless tobacco: Never  Substance and Sexual Activity   Alcohol use: No   Drug use: No   Sexual activity: Never  Other Topics Concern   Not on file  Social History Narrative   Not on file   Social Determinants of Health   Financial Resource Strain: Not on file  Food Insecurity: No Food Insecurity (08/12/2022)   Hunger Vital Sign    Worried About Running Out of Food in the Last Year: Never true    Ran Out of Food in the Last Year: Never true  Transportation Needs: No Transportation Needs (08/12/2022)   PRAPARE - Administrator, Civil Service (Medical): No    Lack of Transportation (Non-Medical): No  Physical Activity: Not on file  Stress: Not on file  Social Connections: Not on file  Intimate Partner Violence: Not At Risk (08/12/2022)   Humiliation, Afraid, Rape, and Kick questionnaire    Fear of Current or Ex-Partner: No    Emotionally Abused: No    Physically Abused: No    Sexually Abused: No      Family History  Problem Relation Age of Onset   Cancer Mother    Stroke Mother    Diabetes Mother    Hypertension Mother    Colon cancer Mother 10       died 2005/10/04   Gout Father    Heart disease Father        PHYSICAL EXAM: General:  Well appearing. No respiratory difficulty HEENT: normal Neck: supple. no JVD. Carotids 2+ bilat; no bruits. No lymphadenopathy or thyromegaly appreciated. Cor: PMI nondisplaced. Regular rate & rhythm. No rubs, gallops or  murmurs. Lungs: clear Abdomen: soft, nontender, nondistended. No hepatosplenomegaly. No bruits or  masses. Good bowel sounds. Extremities: no cyanosis, clubbing, rash, edema Neuro: alert & oriented x 3, cranial nerves grossly intact. moves all 4 extremities w/o difficulty. Affect pleasant.  ECG:   ASSESSMENT & PLAN:  1: Chronic heart failure with preserved ejection fraction- - suspect due to  - NYHA class - euvolemic - weighing daily; reminded to call for an overnight weight gain of > 2 pounds or a weekly weight gain of > 5 pounds - Echo 10/07/22: EF 50-55%.  - Echo 08/12/22: EF 50-55% along with trivial MR.  - continue - BNP 08/27/22 was 1299.1  2: HTN- - BP - saw PCP (Avbuere) - BMP 08/27/22 showed sodium 136, potassium 5.6, creatinine 1.2 & GFR 49  3: DM- - A1c 08/11/22 was 6.4%  4:CAD- - chest CT 10/07/19: Extensive coronary artery calcifications are present. Atherosclerotic changes are present in the aorta and at the origins the great vessels without significant stenosis. - saw cardiology Ann Maki) 07/24   Delma Freeze, FNP 08/31/22

## 2022-09-03 ENCOUNTER — Encounter: Payer: Medicare Other | Admitting: Family

## 2022-09-03 ENCOUNTER — Telehealth: Payer: Self-pay | Admitting: Family

## 2022-09-03 NOTE — Telephone Encounter (Signed)
Patient did not show for her initial Heart Failure Clinic appointment on 09/03/22.

## 2022-09-08 ENCOUNTER — Emergency Department: Payer: Medicare Other

## 2022-09-08 ENCOUNTER — Observation Stay
Admission: EM | Admit: 2022-09-08 | Discharge: 2022-09-11 | Disposition: A | Discharge status: Skilled Nursing Facility | Payer: Medicare Other | Attending: Internal Medicine | Admitting: Internal Medicine

## 2022-09-08 ENCOUNTER — Observation Stay: Payer: Medicare Other

## 2022-09-08 ENCOUNTER — Other Ambulatory Visit: Payer: Self-pay

## 2022-09-08 DIAGNOSIS — Z8616 Personal history of COVID-19: Secondary | ICD-10-CM | POA: Insufficient documentation

## 2022-09-08 DIAGNOSIS — R4182 Altered mental status, unspecified: Secondary | ICD-10-CM | POA: Diagnosis not present

## 2022-09-08 DIAGNOSIS — R55 Syncope and collapse: Secondary | ICD-10-CM | POA: Diagnosis not present

## 2022-09-08 DIAGNOSIS — J9601 Acute respiratory failure with hypoxia: Secondary | ICD-10-CM | POA: Diagnosis present

## 2022-09-08 DIAGNOSIS — R2681 Unsteadiness on feet: Secondary | ICD-10-CM | POA: Insufficient documentation

## 2022-09-08 DIAGNOSIS — Z7984 Long term (current) use of oral hypoglycemic drugs: Secondary | ICD-10-CM | POA: Diagnosis not present

## 2022-09-08 DIAGNOSIS — M6281 Muscle weakness (generalized): Secondary | ICD-10-CM | POA: Diagnosis not present

## 2022-09-08 DIAGNOSIS — Z7982 Long term (current) use of aspirin: Secondary | ICD-10-CM | POA: Insufficient documentation

## 2022-09-08 DIAGNOSIS — I5033 Acute on chronic diastolic (congestive) heart failure: Secondary | ICD-10-CM | POA: Diagnosis not present

## 2022-09-08 DIAGNOSIS — I130 Hypertensive heart and chronic kidney disease with heart failure and stage 1 through stage 4 chronic kidney disease, or unspecified chronic kidney disease: Secondary | ICD-10-CM | POA: Diagnosis not present

## 2022-09-08 DIAGNOSIS — I5032 Chronic diastolic (congestive) heart failure: Secondary | ICD-10-CM | POA: Diagnosis present

## 2022-09-08 DIAGNOSIS — F418 Other specified anxiety disorders: Secondary | ICD-10-CM | POA: Diagnosis present

## 2022-09-08 DIAGNOSIS — E669 Obesity, unspecified: Secondary | ICD-10-CM | POA: Diagnosis present

## 2022-09-08 DIAGNOSIS — R531 Weakness: Principal | ICD-10-CM

## 2022-09-08 DIAGNOSIS — N1831 Chronic kidney disease, stage 3a: Secondary | ICD-10-CM | POA: Diagnosis present

## 2022-09-08 DIAGNOSIS — K8010 Calculus of gallbladder with chronic cholecystitis without obstruction: Secondary | ICD-10-CM | POA: Insufficient documentation

## 2022-09-08 DIAGNOSIS — I959 Hypotension, unspecified: Secondary | ICD-10-CM | POA: Diagnosis not present

## 2022-09-08 DIAGNOSIS — Z79899 Other long term (current) drug therapy: Secondary | ICD-10-CM | POA: Insufficient documentation

## 2022-09-08 DIAGNOSIS — I5A Non-ischemic myocardial injury (non-traumatic): Secondary | ICD-10-CM | POA: Diagnosis present

## 2022-09-08 DIAGNOSIS — E1122 Type 2 diabetes mellitus with diabetic chronic kidney disease: Secondary | ICD-10-CM | POA: Diagnosis not present

## 2022-09-08 DIAGNOSIS — E785 Hyperlipidemia, unspecified: Secondary | ICD-10-CM | POA: Diagnosis present

## 2022-09-08 DIAGNOSIS — Z1152 Encounter for screening for COVID-19: Secondary | ICD-10-CM | POA: Insufficient documentation

## 2022-09-08 DIAGNOSIS — I10 Essential (primary) hypertension: Secondary | ICD-10-CM | POA: Diagnosis present

## 2022-09-08 DIAGNOSIS — E1129 Type 2 diabetes mellitus with other diabetic kidney complication: Secondary | ICD-10-CM | POA: Diagnosis present

## 2022-09-08 LAB — URINALYSIS, W/ REFLEX TO CULTURE (INFECTION SUSPECTED)
Bacteria, UA: NONE SEEN
Bilirubin Urine: NEGATIVE
Glucose, UA: >=500 mg/dL — AB
Hgb urine dipstick: NEGATIVE
Ketones, ur: NEGATIVE mg/dL
Leukocytes,Ua: NEGATIVE
Nitrite: NEGATIVE
Protein, ur: NEGATIVE mg/dL
Specific Gravity, Urine: 1.015 (ref 1.005–1.030)
pH: 5 (ref 5.0–8.0)

## 2022-09-08 LAB — COMPREHENSIVE METABOLIC PANEL
ALT: 16 U/L (ref 0–44)
AST: 20 U/L (ref 15–41)
Albumin: 4 g/dL (ref 3.5–5.0)
Alkaline Phosphatase: 54 U/L (ref 38–126)
Anion gap: 10 (ref 5–15)
BUN: 28 mg/dL — ABNORMAL HIGH (ref 8–23)
CO2: 23 mmol/L (ref 22–32)
Calcium: 9.7 mg/dL (ref 8.9–10.3)
Chloride: 99 mmol/L (ref 98–111)
Creatinine, Ser: 1.41 mg/dL — ABNORMAL HIGH (ref 0.44–1.00)
GFR, Estimated: 40 mL/min — ABNORMAL LOW (ref >60)
Glucose, Bld: 157 mg/dL — ABNORMAL HIGH (ref 70–99)
Potassium: 3.9 mmol/L (ref 3.5–5.1)
Sodium: 132 mmol/L — ABNORMAL LOW (ref 135–145)
Total Bilirubin: 0.7 mg/dL (ref 0.3–1.2)
Total Protein: 7 g/dL (ref 6.5–8.1)

## 2022-09-08 LAB — URINE DRUG SCREEN, QUALITATIVE (ARMC ONLY)
Amphetamines, Ur Screen: NOT DETECTED
Barbiturates, Ur Screen: NOT DETECTED
Benzodiazepine, Ur Scrn: NOT DETECTED
Cannabinoid 50 Ng, Ur ~~LOC~~: NOT DETECTED
Cocaine Metabolite,Ur ~~LOC~~: NOT DETECTED
MDMA (Ecstasy)Ur Screen: NOT DETECTED
Methadone Scn, Ur: NOT DETECTED
Opiate, Ur Screen: NOT DETECTED
Phencyclidine (PCP) Ur S: NOT DETECTED
Tricyclic, Ur Screen: NOT DETECTED

## 2022-09-08 LAB — CBG MONITORING, ED
Glucose-Capillary: 139 mg/dL — ABNORMAL HIGH (ref 70–99)
Glucose-Capillary: 132 mg/dL — ABNORMAL HIGH (ref 70–99)

## 2022-09-08 LAB — CBC WITH DIFFERENTIAL/PLATELET
Abs Immature Granulocytes: 0.05 10*3/uL (ref 0.00–0.07)
Basophils Absolute: 0.1 10*3/uL (ref 0.0–0.1)
Basophils Relative: 1 %
Eosinophils Absolute: 0.1 10*3/uL (ref 0.0–0.5)
Eosinophils Relative: 1 %
HCT: 34.3 % — ABNORMAL LOW (ref 36.0–46.0)
Hemoglobin: 11.2 g/dL — ABNORMAL LOW (ref 12.0–15.0)
Immature Granulocytes: 1 %
Lymphocytes Relative: 22 %
Lymphs Abs: 2.1 10*3/uL (ref 0.7–4.0)
MCH: 31.5 pg (ref 26.0–34.0)
MCHC: 32.7 g/dL (ref 30.0–36.0)
MCV: 96.6 fL (ref 80.0–100.0)
Monocytes Absolute: 0.8 10*3/uL (ref 0.1–1.0)
Monocytes Relative: 8 %
Neutro Abs: 6.6 10*3/uL (ref 1.7–7.7)
Neutrophils Relative %: 67 %
Platelets: 306 10*3/uL (ref 150–400)
RBC: 3.55 MIL/uL — ABNORMAL LOW (ref 3.87–5.11)
RDW: 13.3 % (ref 11.5–15.5)
WBC: 9.7 10*3/uL (ref 4.0–10.5)
nRBC: 0 % (ref 0.0–0.2)

## 2022-09-08 LAB — LACTIC ACID, PLASMA
Lactic Acid, Venous: 1.5 mmol/L (ref 0.5–1.9)
Lactic Acid, Venous: 1.5 mmol/L (ref 0.5–1.9)

## 2022-09-08 LAB — GLUCOSE, CAPILLARY: Glucose-Capillary: 111 mg/dL — ABNORMAL HIGH (ref 70–99)

## 2022-09-08 LAB — TROPONIN I (HIGH SENSITIVITY)
Troponin I (High Sensitivity): 28 ng/L — ABNORMAL HIGH (ref <18)
Troponin I (High Sensitivity): 28 ng/L — ABNORMAL HIGH (ref <18)

## 2022-09-08 LAB — BRAIN NATRIURETIC PEPTIDE: B Natriuretic Peptide: 927.4 pg/mL — ABNORMAL HIGH (ref 0.0–100.0)

## 2022-09-08 LAB — D-DIMER, QUANTITATIVE: D-Dimer, Quant: 1.38 ug/mL-FEU — ABNORMAL HIGH (ref 0.00–0.50)

## 2022-09-08 LAB — SARS CORONAVIRUS 2 BY RT PCR: SARS Coronavirus 2 by RT PCR: NEGATIVE

## 2022-09-08 MED ORDER — METOPROLOL SUCCINATE ER 50 MG PO TB24
50.0000 mg | ORAL_TABLET | Freq: Every day | ORAL | Status: DC
  Administered 2022-09-08 – 2022-09-11 (×4): 50 mg via ORAL
  Filled 2022-09-08 (×4): qty 1

## 2022-09-08 MED ORDER — ONDANSETRON HCL 4 MG/2ML IJ SOLN
4.0000 mg | Freq: Four times a day (QID) | INTRAMUSCULAR | Status: DC | PRN
  Administered 2022-09-08 – 2022-09-09 (×2): 4 mg via INTRAVENOUS
  Filled 2022-09-08 (×2): qty 2

## 2022-09-08 MED ORDER — ONDANSETRON HCL 4 MG/2ML IJ SOLN
4.0000 mg | Freq: Once | INTRAMUSCULAR | Status: AC
  Administered 2022-09-08: 4 mg via INTRAVENOUS
  Filled 2022-09-08: qty 2

## 2022-09-08 MED ORDER — GLUCOSAMINE-CHONDROITIN 500-400 MG PO CAPS: 1.0000 | ORAL_CAPSULE | Freq: Every day | ORAL | Status: DC

## 2022-09-08 MED ORDER — EZETIMIBE 10 MG PO TABS
10.0000 mg | ORAL_TABLET | Freq: Every day | ORAL | Status: DC
  Administered 2022-09-08 – 2022-09-10 (×3): 10 mg via ORAL
  Filled 2022-09-08 (×3): qty 1

## 2022-09-08 MED ORDER — SPIRONOLACTONE 12.5 MG HALF TABLET
12.5000 mg | ORAL_TABLET | Freq: Every day | ORAL | Status: DC
  Administered 2022-09-08 – 2022-09-09 (×2): 12.5 mg via ORAL
  Filled 2022-09-08 (×3): qty 1

## 2022-09-08 MED ORDER — TORSEMIDE 20 MG PO TABS
20.0000 mg | ORAL_TABLET | Freq: Every day | ORAL | Status: DC
  Administered 2022-09-08 – 2022-09-09 (×2): 20 mg via ORAL
  Filled 2022-09-08 (×2): qty 1

## 2022-09-08 MED ORDER — ALBUTEROL SULFATE HFA 108 (90 BASE) MCG/ACT IN AERS: 2.0000 | INHALATION_SPRAY | RESPIRATORY_TRACT | Status: DC | PRN

## 2022-09-08 MED ORDER — INSULIN ASPART 100 UNIT/ML IJ SOLN: 0.0000 [IU] | Freq: Every day | INTRAMUSCULAR | Status: DC

## 2022-09-08 MED ORDER — ACETAMINOPHEN 325 MG PO TABS
650.0000 mg | ORAL_TABLET | Freq: Four times a day (QID) | ORAL | Status: DC | PRN
  Administered 2022-09-08 – 2022-09-11 (×6): 650 mg via ORAL
  Filled 2022-09-08 (×6): qty 2

## 2022-09-08 MED ORDER — ACETAMINOPHEN 650 MG RE SUPP: 650.0000 mg | Freq: Four times a day (QID) | RECTAL | Status: DC | PRN

## 2022-09-08 MED ORDER — MAGNESIUM OXIDE -MG SUPPLEMENT 400 (240 MG) MG PO TABS
400.0000 mg | ORAL_TABLET | Freq: Every day | ORAL | Status: DC
  Administered 2022-09-08 – 2022-09-11 (×4): 400 mg via ORAL
  Filled 2022-09-08 (×5): qty 1

## 2022-09-08 MED ORDER — LACTATED RINGERS IV BOLUS
1000.0000 mL | Freq: Once | INTRAVENOUS | Status: AC
  Administered 2022-09-08: 1000 mL via INTRAVENOUS

## 2022-09-08 MED ORDER — ZINC SULFATE 220 (50 ZN) MG PO CAPS
220.0000 mg | ORAL_CAPSULE | Freq: Every day | ORAL | Status: DC
  Administered 2022-09-08 – 2022-09-11 (×4): 220 mg via ORAL
  Filled 2022-09-08 (×4): qty 1

## 2022-09-08 MED ORDER — IOHEXOL 350 MG/ML SOLN
75.0000 mL | Freq: Once | INTRAVENOUS | Status: AC | PRN
  Administered 2022-09-08: 75 mL via INTRAVENOUS

## 2022-09-08 MED ORDER — DM-GUAIFENESIN ER 30-600 MG PO TB12
1.0000 | ORAL_TABLET | Freq: Two times a day (BID) | ORAL | Status: DC | PRN
  Administered 2022-09-08 – 2022-09-11 (×3): 1 via ORAL
  Filled 2022-09-08 (×4): qty 1

## 2022-09-08 MED ORDER — SODIUM CHLORIDE 0.9 % IV SOLN: INTRAVENOUS | Status: DC

## 2022-09-08 MED ORDER — GARLIC 1000 MG PO CAPS: 5000.0000 mg | ORAL_CAPSULE | Freq: Every day | ORAL | Status: DC

## 2022-09-08 MED ORDER — GABAPENTIN 300 MG PO CAPS
300.0000 mg | ORAL_CAPSULE | Freq: Three times a day (TID) | ORAL | Status: DC
  Administered 2022-09-08 – 2022-09-11 (×11): 300 mg via ORAL
  Filled 2022-09-08 (×11): qty 1

## 2022-09-08 MED ORDER — HYDRALAZINE HCL 20 MG/ML IJ SOLN: 5.0000 mg | INTRAMUSCULAR | Status: DC | PRN

## 2022-09-08 MED ORDER — ORAL CARE MOUTH RINSE: 15.0000 mL | OROMUCOSAL | Status: DC | PRN

## 2022-09-08 MED ORDER — ASPIRIN 81 MG PO TBEC
81.0000 mg | DELAYED_RELEASE_TABLET | Freq: Every day | ORAL | Status: DC
  Administered 2022-09-08 – 2022-09-11 (×4): 81 mg via ORAL
  Filled 2022-09-08 (×4): qty 1

## 2022-09-08 MED ORDER — SODIUM CHLORIDE 0.9% FLUSH
3.0000 mL | Freq: Two times a day (BID) | INTRAVENOUS | Status: DC
  Administered 2022-09-08 – 2022-09-11 (×7): 3 mL via INTRAVENOUS

## 2022-09-08 MED ORDER — OYSTER SHELL CALCIUM/D3 500-5 MG-MCG PO TABS
1.0000 | ORAL_TABLET | Freq: Every day | ORAL | Status: DC
  Administered 2022-09-08 – 2022-09-11 (×4): 1 via ORAL
  Filled 2022-09-08 (×4): qty 1

## 2022-09-08 MED ORDER — INSULIN ASPART 100 UNIT/ML IJ SOLN
0.0000 [IU] | Freq: Three times a day (TID) | INTRAMUSCULAR | Status: DC
  Administered 2022-09-08 – 2022-09-09 (×3): 1 [IU] via SUBCUTANEOUS
  Administered 2022-09-09: 2 [IU] via SUBCUTANEOUS
  Administered 2022-09-10 (×2): 1 [IU] via SUBCUTANEOUS
  Administered 2022-09-11: 2 [IU] via SUBCUTANEOUS
  Filled 2022-09-08 (×6): qty 1

## 2022-09-08 MED ORDER — SIMVASTATIN 20 MG PO TABS
40.0000 mg | ORAL_TABLET | Freq: Every evening | ORAL | Status: DC
  Administered 2022-09-08 – 2022-09-11 (×4): 40 mg via ORAL
  Filled 2022-09-08 (×3): qty 2

## 2022-09-08 MED ORDER — TRAZODONE HCL 50 MG PO TABS
25.0000 mg | ORAL_TABLET | Freq: Every evening | ORAL | Status: DC | PRN
  Administered 2022-09-08 – 2022-09-09 (×2): 25 mg via ORAL
  Filled 2022-09-08 (×2): qty 1

## 2022-09-08 MED ORDER — LORATADINE 10 MG PO TABS
10.0000 mg | ORAL_TABLET | Freq: Every day | ORAL | Status: DC
  Administered 2022-09-08 – 2022-09-11 (×4): 10 mg via ORAL
  Filled 2022-09-08 (×4): qty 1

## 2022-09-08 MED ORDER — ALPRAZOLAM 0.25 MG PO TABS: 0.2500 mg | ORAL_TABLET | Freq: Every evening | ORAL | Status: DC | PRN

## 2022-09-08 MED ORDER — PANTOPRAZOLE SODIUM 40 MG PO TBEC
40.0000 mg | DELAYED_RELEASE_TABLET | Freq: Every day | ORAL | Status: DC
  Administered 2022-09-08 – 2022-09-11 (×4): 40 mg via ORAL
  Filled 2022-09-08 (×4): qty 1

## 2022-09-08 MED ORDER — ALBUTEROL SULFATE (2.5 MG/3ML) 0.083% IN NEBU
2.5000 mg | INHALATION_SOLUTION | RESPIRATORY_TRACT | Status: DC | PRN
  Administered 2022-09-11: 2.5 mg via RESPIRATORY_TRACT
  Filled 2022-09-08: qty 3

## 2022-09-08 MED ORDER — MAGNESIUM HYDROXIDE 400 MG/5ML PO SUSP: 30.0000 mL | Freq: Every day | ORAL | Status: DC | PRN

## 2022-09-08 MED ORDER — ADULT MULTIVITAMIN W/MINERALS CH
1.0000 | ORAL_TABLET | Freq: Every day | ORAL | Status: DC
  Administered 2022-09-08 – 2022-09-11 (×4): 1 via ORAL
  Filled 2022-09-08 (×4): qty 1

## 2022-09-08 MED ORDER — ENOXAPARIN SODIUM 60 MG/0.6ML IJ SOSY
45.0000 mg | PREFILLED_SYRINGE | INTRAMUSCULAR | Status: DC
  Administered 2022-09-08: 45 mg via SUBCUTANEOUS
  Filled 2022-09-08 (×2): qty 0.6

## 2022-09-08 MED ORDER — VITAMIN C 500 MG PO TABS
500.0000 mg | ORAL_TABLET | Freq: Every day | ORAL | Status: DC
  Administered 2022-09-08 – 2022-09-11 (×4): 500 mg via ORAL
  Filled 2022-09-08 (×4): qty 1

## 2022-09-08 MED ORDER — EMPAGLIFLOZIN 10 MG PO TABS
10.0000 mg | ORAL_TABLET | Freq: Every day | ORAL | Status: DC
  Administered 2022-09-08 – 2022-09-11 (×4): 10 mg via ORAL
  Filled 2022-09-08 (×4): qty 1

## 2022-09-08 MED ORDER — ONDANSETRON HCL 4 MG PO TABS
4.0000 mg | ORAL_TABLET | Freq: Four times a day (QID) | ORAL | Status: DC | PRN
  Administered 2022-09-09 – 2022-09-11 (×5): 4 mg via ORAL
  Filled 2022-09-08 (×5): qty 1

## 2022-09-08 NOTE — ED Notes (Signed)
Called to set up transport to the floor at this time.

## 2022-09-08 NOTE — ED Triage Notes (Signed)
Pt BIB from home via EMS for near syncopal event.  She was just released to home from a rehab facility.  No obvious injuries

## 2022-09-08 NOTE — Progress Notes (Signed)
PHARMACIST - PHYSICIAN COMMUNICATION  CONCERNING:  Enoxaparin (Lovenox) for DVT Prophylaxis    RECOMMENDATION: Patient was prescribed enoxaprin 40mg  q24 hours for VTE prophylaxis.   Filed Weights   09/08/22 0600  Weight: 86.6 kg (190 lb 14.7 oz)    Body mass index is 32.77 kg/m.  Estimated Creatinine Clearance: 39.6 mL/min (A) (by C-G formula based on SCr of 1.41 mg/dL (H)).   Based on [facility] policy patient is candidate for enoxaparin 0.5mg /kg TBW SQ every 24 hours based on BMI being >30.  DESCRIPTION: Pharmacy has adjusted enoxaparin dose per [facility] policy.  Patient is now receiving enoxaparin 0.5 mg/kg every 24 hours   Otelia Sergeant, PharmD, [facility] 09/08/2022 6:02 AM

## 2022-09-08 NOTE — ED Notes (Signed)
Advised nurse that patient has ready bed 

## 2022-09-08 NOTE — H&P (Addendum)
History and Physical    Jodi Chavez BJY:[id number] DOB: 1950-11-18 DOA: 09/08/2022  Referring MD/NP/PA:   PCP: Fleet Contras, MD   Patient coming from:  The patient is coming from home.     Chief Complaint: syncope  HPI: Jodi Chavez is a 72 y.o. 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  Patient was recently hospitalized from 6/15 - 6/21 due to CHF exacerbation.  Patient was discharged to rehab facility, just released to home from rehab. Per report, pt was on her couch when she passed out at about 11:00 PM last night.  Patient has shortness of breath, no chest pain, cough.  Patient has nausea, no vomiting, diarrhea or abdominal pain.  Denies symptoms of UTI.  She states that she passed out for approximately 2 minutes.  She does not have unilateral numbness or tinglings in extremities.  No facial droop or slurred speech.  Patient moves all extremities normally.  EMS reports initial blood pressure was in 70s at the scene.  She received 1 L LR bolus in ED.  Blood pressure is 90/49 and  117/51 when I saw patient in ED.  Data reviewed independently and ED Course: pt was found to have trop 28 --> 28, BNP 927, Positive D-dimer 1.38, WBC 9.7, stable renal function, negative COVID PCR, temperature normal, heart rate 88, RR 19, blood pressure 117/51 currently, oxygen saturation 96% on 3 L oxygen (no documented oxygen desaturation).  X-ray negative.  CT head negative for acute intracranial abnormalities.  CTA negative for PE.  Lower extremity venous Doppler negative for DVT.  Patient is placed on telemetry bed for observation.   EKG: I have personally reviewed.  Sinus rhythm, QTc 439, RAD, anteroseptal infarction pattern, mild ST depression in inferior leads and V4-V6.   Review of Systems:   General: no fevers, chills, no body weight gain, has  fatigue HEENT: no blurry vision, hearing changes or sore throat Respiratory: has dyspnea, no coughing, wheezing CV: no chest pain, no palpitations GI: has nausea, no vomiting, abdominal pain, diarrhea, constipation GU: no dysuria, burning on urination, increased urinary frequency, hematuria  Ext: has trace leg leg edema Neuro: no unilateral weakness, numbness, or tingling, no vision change or hearing loss. Has syncope Skin: no rash, no skin tear. MSK: No muscle spasm, no deformity, no limitation of range of movement in spin Heme: No easy bruising.  Travel history: No recent long distant travel.   Allergy:  Allergies  Allergen Reactions   Naproxen Sodium Rash    anaprox    Past Medical History:  Diagnosis Date   Arthritis    hip   CHF (congestive heart failure) (HCC)    Constipation    uses laxatives several times a week   Depression    Diabetes mellitus    takes Metformin and Amaryl daily   Dizziness    occasionally and related to meds    Early cataracts, bilateral    History of bronchitis    last time several yrs ago   History of migraine    many yrs ago   Hyperlipidemia    takes Zetia and Zocor daily   Hypertension    takes Benazepril nightly and Propranolol tid and Clonidine daily   Insomnia    Low back pain    Peripheral edema    Peripheral neuropathy    PONV (postoperative nausea and vomiting)  Slow urinary stream    occasionally    Past Surgical History:  Procedure Laterality Date   COLONOSCOPY     d&c/hysteroscopy/ablation     DILATION AND CURETTAGE OF UTERUS     couple of times   ESOPHAGOGASTRODUODENOSCOPY     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 She has never used smokeless tobacco. She reports that she does not drink alcohol and does not use drugs.  Family History:  Family History  Problem Relation Age of Onset   Cancer Mother    Stroke Mother    Diabetes Mother    Hypertension Mother    Colon cancer Mother 53       died 09/15/2005   Gout Father    Heart disease Father      Prior to Admission medications   Medication Sig Start Date End Date Taking? Authorizing Provider  ALPRAZolam (XANAX) 0.25 MG tablet Take 0.25 mg by mouth at bedtime as needed. 08/31/22  Yes [provider]  ascorbic acid (VITAMIN C) 500 MG tablet Take 500 mg by mouth daily.   Yes [provider]  aspirin 81 MG EC tablet Take 81 mg by mouth daily.   Yes [provider]  benazepril (LOTENSIN) 40 MG tablet Take 40 mg by mouth daily.   Yes [provider]  calcium-vitamin D (OSCAL WITH D) 500-200 MG-UNIT TABS tablet Take 1 tablet by mouth daily.   Yes [provider]  empagliflozin (JARDIANCE) 10 MG TABS tablet Take 1 tablet (10 mg total) by mouth daily. 08/18/22  Yes Lurene Shadow, MD  ezetimibe (ZETIA) 10 MG tablet Take 10 mg by mouth at bedtime.   Yes [provider]  gabapentin (NEURONTIN) 300 MG capsule Take 300 mg by mouth 3 (three) times daily.   Yes [provider]  Garlic 1000 MG CAPS Take 5,000 mg by mouth daily after breakfast.    Yes [provider]  Glucosamine-Chondroitin 500-400 MG CAPS Take 1 tablet by mouth daily.   Yes [provider]  magnesium oxide (MAG-OX) 400 MG tablet Take 400 mg by mouth daily. 08/27/22  Yes [provider]  metFORMIN (GLUCOPHAGE) 850 MG tablet Take 850 mg by mouth daily with breakfast.   Yes [provider]  metoprolol succinate (TOPROL-XL) 50 MG 24 hr tablet Take 50 mg by mouth daily.   Yes [provider]  Multiple Vitamin (MULITIVITAMIN WITH MINERALS)  TABS Take 1 tablet by mouth daily after breakfast.    Yes [provider]  simvastatin (ZOCOR) 40 MG tablet Take 40 mg by mouth every evening.   Yes [provider]  torsemide (DEMADEX) 20 MG tablet Take 20 mg by mouth daily. 08/28/22 08/28/23 Yes [provider]  zinc sulfate 220 (50 Zn) MG capsule Take 220 mg by mouth daily.   Yes [provider]  furosemide (LASIX) 20 MG tablet Take 1 tablet (20 mg total) by mouth daily. Patient not taking: Reported on 09/08/2022 08/17/22   Lurene Shadow, MD  pantoprazole (PROTONIX) 40 MG tablet Take 1 tablet (40 mg total) by mouth daily. Patient not taking: Reported on 09/08/2022 11/25/14  Rai, Delene Ruffini, MD  promethazine (PHENERGAN) 25 MG tablet Take 1 tablet (25 mg total) by mouth every 6 (six) hours as needed for nausea. Patient not taking: Reported on 09/08/2022 07/20/17   Molpus, Jonny Ruiz, MD  spironolactone (ALDACTONE) 25 MG tablet Take 0.5 tablets (12.5 mg total) by mouth daily. Patient not taking: Reported on 09/08/2022 08/18/22   Lurene Shadow, MD  DOXEPIN HCL PO Take by mouth.    05/10/11  [provider]  GABAPENTIN, PHN, PO Take by mouth.    05/10/11  [provider]    Physical Exam: Vitals:   09/08/22 0800 09/08/22 0900 09/08/22 0940 09/08/22 1000  BP: 119/68 124/63  (!) 118/58  Pulse: 70 74  77  Resp: 14 14  18   Temp:   98.3 F (36.8 C)   TempSrc:   Oral   SpO2: 95% 97%  100%  Weight:       General: Not in acute distress HEENT:       Eyes: PERRL, EOMI, no jaundice       ENT: No discharge from the ears and nose, no pharynx injection, no tonsillar enlargement.        Neck: No JVD, no bruit, no mass felt. Heme: No neck lymph node enlargement. Cardiac: S1/S2, RRR, No murmurs, No gallops or rubs. Respiratory: No rales, wheezing, rhonchi or rubs. GI: Soft, nondistended, nontender, no rebound pain, no organomegaly, BS present. GU: No hematuria Ext: has trace leg edema bilaterally. 1+DP/PT pulse  bilaterally. Musculoskeletal: No joint deformities, No joint redness or warmth, no limitation of ROM in spin. Skin: No rashes.  Neuro: Alert, oriented X3, cranial nerves II-XII grossly intact, moves all extremities normally.  Psych: Patient is not psychotic, no suicidal or hemocidal ideation.  Labs on Admission: I have personally reviewed following labs and imaging studies  CBC: Recent Labs  Lab 09/08/22 0035  WBC 9.7  NEUTROABS 6.6  HGB 11.2*  HCT 34.3*  MCV 96.6  PLT 306   Basic Metabolic Panel: Recent Labs  Lab 09/08/22 0035  NA 132*  K 3.9  CL 99  CO2 23  GLUCOSE 157*  BUN 28*  CREATININE 1.41*  CALCIUM 9.7   GFR: Estimated Creatinine Clearance: 39.6 mL/min (A) (by C-G formula based on SCr of 1.41 mg/dL (H)). Liver Function Tests: Recent Labs  Lab 09/08/22 0035  AST 20  ALT 16  ALKPHOS 54  BILITOT 0.7  PROT 7.0  ALBUMIN 4.0   No results for input(s): "LIPASE", "AMYLASE" in the last 168 hours. No results for input(s): "AMMONIA" in the last 168 hours. Coagulation Profile: No results for input(s): "INR", "PROTIME" in the last 168 hours. Cardiac Enzymes: No results for input(s): "CKTOTAL", "CKMB", "CKMBINDEX", "TROPONINI" in the last 168 hours. BNP (last 3 results) No results for input(s): "PROBNP" in the last 8760 hours. HbA1C: No results for input(s): "HGBA1C" in the last 72 hours. CBG: Recent Labs  Lab 09/08/22 1209  GLUCAP 139*   Lipid Profile: No results for input(s): "CHOL", "HDL", "LDLCALC", "TRIG", "CHOLHDL", "LDLDIRECT" in the last 72 hours. Thyroid Function Tests: No results for input(s): "TSH", "T4TOTAL", "FREET4", "T3FREE", "THYROIDAB" in the last 72 hours. Anemia Panel: No results for input(s): "VITAMINB12", "FOLATE", "FERRITIN", "TIBC", "IRON", "RETICCTPCT" in the last 72 hours. Urine analysis:    Component Value Date/Time   COLORURINE YELLOW (A) 09/08/2022 0238   APPEARANCEUR CLEAR (A) 09/08/2022 0238   LABSPEC 1.015 09/08/2022  0238   PHURINE 5.0 09/08/2022 0238   GLUCOSEU >=500 (A) 09/08/2022  0238   HGBUR NEGATIVE 09/08/2022 0238   BILIRUBINUR NEGATIVE 09/08/2022 0238   KETONESUR NEGATIVE 09/08/2022 0238   PROTEINUR NEGATIVE 09/08/2022 0238   UROBILINOGEN 0.2 11/21/2014 1835   NITRITE NEGATIVE 09/08/2022 0238   LEUKOCYTESUR NEGATIVE 09/08/2022 0238   Sepsis Labs: @LABRCNTIP (procalcitonin:4,lacticidven:4) ) Recent Results (from the past 240 hour(s))  Culture, blood (routine x 2)     Status: None (Preliminary result)   Collection Time: 09/08/22 12:35 AM   Specimen: BLOOD LEFT HAND  Result Value Ref Range Status   Specimen Description BLOOD LEFT HAND  Final   Special Requests   Final    BOTTLES DRAWN AEROBIC AND ANAEROBIC Blood Culture results may not be optimal due to an excessive volume of blood received in culture bottles   Culture   Final    NO GROWTH < 12 HOURS Performed at [facility], [address] [place], [place] [address]    Report Status PENDING  Incomplete  Culture, blood (routine x 2)     Status: None (Preliminary result)   Collection Time: 09/08/22 12:35 AM   Specimen: BLOOD RIGHT HAND  Result Value Ref Range Status   Specimen Description BLOOD RIGHT HAND  Final   Special Requests   Final    BOTTLES DRAWN AEROBIC AND ANAEROBIC Blood Culture adequate volume   Culture   Final    NO GROWTH < 12 HOURS Performed at [facility], [address], [place], [place] [address]    Report Status PENDING  Incomplete  SARS Coronavirus 2 by RT PCR (hospital order, performed in [facility] hospital lab) *cepheid single result test* Anterior Nasal Swab     Status: None   Collection Time: 09/08/22  1:02 AM   Specimen: Anterior Nasal Swab  Result Value Ref Range Status   SARS Coronavirus 2 by RT PCR NEGATIVE NEGATIVE Final    Comment: (NOTE) SARS-CoV-2 target nucleic acids are NOT DETECTED.  The SARS-CoV-2 RNA is generally detectable in upper and lower respiratory specimens  during the acute phase of infection. The lowest concentration of SARS-CoV-2 viral copies this assay can detect is 250 copies / mL. A negative result does not preclude SARS-CoV-2 infection and should not be used as the sole basis for treatment or other patient management decisions.  A negative result may occur with improper specimen collection / handling, submission of specimen other than nasopharyngeal swab, presence of viral mutation(s) within the areas targeted by this assay, and inadequate number of viral copies (<250 copies / mL). A negative result must be combined with clinical observations, patient history, and epidemiological information.  Fact Sheet for Patients:   RoadLapTop.co.za  Fact Sheet for Healthcare Providers: http://kim-miller.com/  This test is not yet approved or  cleared by the [place] FDA and has been authorized for detection and/or diagnosis of SARS-CoV-2 by FDA under an Emergency Use Authorization (EUA).  This EUA will remain in effect (meaning this test can be used) for the duration of the COVID-19 declaration under Section 564(b)(1) of the Act, 21 U.S.C. section 360bbb-3(b)(1), unless the authorization is terminated or revoked sooner.  Performed at [facility], [address], [place], [place] [address]      Radiological Exams on Admission: US Venous Img Lower Bilateral (DVT)  Result Date: 09/08/2022 CLINICAL DATA:  Elevated D-dimer.  Evaluate for DVT. EXAM: BILATERAL LOWER EXTREMITY VENOUS DOPPLER ULTRASOUND TECHNIQUE: Gray-scale sonography with graded compression, as well as color Doppler and duplex ultrasound were performed to evaluate the lower extremity  deep venous systems from the level of the common femoral vein and including the common femoral, femoral, profunda femoral, popliteal and calf veins including the posterior tibial, peroneal and gastrocnemius veins when visible. The superficial  great saphenous vein was also interrogated. Spectral Doppler was utilized to evaluate flow at rest and with distal augmentation maneuvers in the common femoral, femoral and popliteal veins. COMPARISON:  Chest CTA-earlier same day (negative for pulmonary embolism). FINDINGS: RIGHT LOWER EXTREMITY Common Femoral Vein: No evidence of thrombus. Normal compressibility, respiratory phasicity and response to augmentation. Saphenofemoral Junction: No evidence of thrombus. Normal compressibility and flow on color Doppler imaging. Profunda Femoral Vein: No evidence of thrombus. Normal compressibility and flow on color Doppler imaging. Femoral Vein: No evidence of thrombus. Normal compressibility, respiratory phasicity and response to augmentation. Popliteal Vein: No evidence of thrombus. Normal compressibility, respiratory phasicity and response to augmentation. Calf Veins: No evidence of thrombus. Normal compressibility and flow on color Doppler imaging. Superficial Great Saphenous Vein: No evidence of thrombus. Normal compressibility. Other Findings:  None. LEFT LOWER EXTREMITY Common Femoral Vein: No evidence of thrombus. Normal compressibility, respiratory phasicity and response to augmentation. Saphenofemoral Junction: No evidence of thrombus. Normal compressibility and flow on color Doppler imaging. Profunda Femoral Vein: No evidence of thrombus. Normal compressibility and flow on color Doppler imaging. Femoral Vein: No evidence of thrombus. Normal compressibility, respiratory phasicity and response to augmentation. Popliteal Vein: No evidence of thrombus. Normal compressibility, respiratory phasicity and response to augmentation. Calf Veins: No evidence of thrombus. Normal compressibility and flow on color Doppler imaging. Superficial Great Saphenous Vein: No evidence of thrombus. Normal compressibility. Other Findings: There is a moderate amount of subcutaneous edema at the level of the left lower leg and calf.  IMPRESSION: No evidence of DVT within either lower extremity. Electronically Signed   By: Simonne Come M.D.   On: 09/08/2022 12:12   CT Angio Chest Pulmonary Embolism (PE) W or WO Contrast  Result Date: 09/08/2022 CLINICAL DATA:  Syncope, high clinical suspicion for PE EXAM: CT ANGIOGRAPHY CHEST WITH CONTRAST TECHNIQUE: Multidetector CT imaging of the chest was performed using the standard protocol during bolus administration of intravenous contrast. Multiplanar CT image reconstructions and MIPs were obtained to evaluate the vascular anatomy. RADIATION DOSE REDUCTION: This exam was performed according to the departmental dose-optimization program which includes automated exposure control, adjustment of the mA and/or kV according to patient size and/or use of iterative reconstruction technique. CONTRAST:  75mL OMNIPAQUE IOHEXOL 350 MG/ML SOLN COMPARISON:  CT done on 10/07/2019, chest radiograph done today FINDINGS: Cardiovascular: There are no intraluminal filling defects seen pulmonary artery branches. There is homogeneous enhancement in thoracic aorta. Calcifications are seen thoracic aorta and its major branches. Coronary artery calcifications are seen. Minimal pericardial effusion is seen. Mediastinum/Nodes: There are slightly enlarged lymph nodes in mediastinum and hilar regions. Lungs/Pleura: Extrinsic patchy ground-glass densities are seen in both lungs. There is no focal consolidation. There is no pleural effusion or pneumothorax. Upper Abdomen: There are calcified gallbladder stones. There are no signs of acute cholecystitis. Musculoskeletal: Degenerative changes are noted in both shoulders. Degenerative changes are noted in thoracic spine with posterior bony spurs causing extrinsic pressure over the ventral margin of thecal sac at multiple levels. Review of the MIP images confirms the above findings. IMPRESSION: There is no evidence of pulmonary embolism. There is no evidence of thoracic aortic  dissection. Aortic atherosclerosis. Coronary artery calcifications are seen. Extensive patchy ground-glass densities are seen in both lungs suggesting pulmonary edema or  multifocal pneumonia. There is no focal pulmonary consolidation. There is no pleural effusion or pneumothorax. Gallbladder stones. Degenerative changes are noted in thoracic spine and both shoulders. Electronically Signed   By: Ernie Avena M.D.   On: 09/08/2022 11:14   DG Chest Port 1 View  Result Date: 09/08/2022 CLINICAL DATA:  Altered mental status EXAM: PORTABLE CHEST 1 VIEW COMPARISON:  Radiographs 08/14/2022 FINDINGS: The lung volumes accentuate cardiomediastinal silhouette and pulmonary vascularity. Aortic atherosclerotic calcification. No focal consolidation, pleural effusion, or pneumothorax. No displaced rib fractures. IMPRESSION: Low lung volumes.  No active disease. Electronically Signed   By: Minerva Fester M.D.   On: 09/08/2022 01:06   CT Head Wo Contrast  Result Date: 09/08/2022 CLINICAL DATA:  Mental status change EXAM: CT HEAD WITHOUT CONTRAST TECHNIQUE: Contiguous axial images were obtained from the base of the skull through the vertex without intravenous contrast. RADIATION DOSE REDUCTION: This exam was performed according to the departmental dose-optimization program which includes automated exposure control, adjustment of the mA and/or kV according to patient size and/or use of iterative reconstruction technique. COMPARISON:  CT head 05/27/2022 FINDINGS: Brain: No intracranial hemorrhage, mass effect, or evidence of acute infarct. No hydrocephalus. No extra-axial fluid collection. Vascular: No hyperdense vessel or unexpected calcification. Skull: No fracture or focal lesion. Sinuses/Orbits: No acute finding. Paranasal sinuses and mastoid air cells are well aerated. Other: None. IMPRESSION: No acute intracranial abnormality. Electronically Signed   By: Minerva Fester M.D.   On: 09/08/2022 00:49       Assessment/Plan Principal Problem:   Syncope Active Problems:   Myocardial injury   Essential hypertension   HLD (hyperlipidemia)   Chronic diastolic CHF (congestive heart failure) (HCC)   Type II diabetes mellitus with renal manifestations (HCC)   Chronic kidney disease, stage 3a (HCC)   Depression with anxiety   Obesity (BMI 30-39.9)   Assessment and Plan:  Syncope: Etiology is not clear. The differential diagnosis is broad.  CT head negative.  No focal neurodeficit on physical examination, low suspicions for stroke.  Pt has SOB and reportedly patient had hypotension, PE is a potential differential diagnosis, but CTA negative for PE.  Patient likely had orthostatic hypotension  - Place on tele for obs - Orthostatic vital signs  - Neuro checks  - IVF: 1L of NS - PT/OT eval and treat  Myocardial injury: trop 28 --> 28, no CP -Observe closely -Patient is on Zocor, zetia and ASA  Essential hypertension -IV hydralazine as needed -Metoprolol -Torsemide, spironolactone which are for CHF -Hold Lotensin due to hypotension  HLD (hyperlipidemia) -Zocor and zetia  Chronic diastolic CHF (congestive heart failure) (HCC):: 2D echo on 08/12/2022 showed EF of 50-50%.  Patient has an elevated BNP 927, but only has trace leg edema, No pulmonary on chest x-ray.  Does not seem to have acute CHF exacerbation -Continue home torsemide and spironolactone -Will not give IV diuretics due to hypotension  Type II diabetes mellitus with renal manifestations [facility]): Recent A1c 6.4, well-controlled.  Patient is taking Jardiance at home -Sliding scale insulin  Chronic kidney disease, stage 3a (HCC):: Stable.  Recent baseline creatinine 1.3-1.6.  Her creatinine is 1.41, BUN 28, GFR 40 -Follow-up with BMP  Depression with anxiety -Continue home medications  Obesity (BMI 30-39.9): Body weight 88.6 kg, BMI 32.77 -Encourage losing weight -Exercise and healthy diet      DVT ppx: SQ  Lovenox  Code Status: Full code per pt  Family Communication: Yes, patient's nephew and sister by phone  Disposition Plan:  Anticipate discharge back to previous environment  Consults called:  none  Admission status and Level of care: Telemetry Cardiac:     for obs    Dispo: The patient is from: Home              Anticipated d/c is to: Home              Anticipated d/c date is: 1 day              Patient currently is not medically stable to d/c.    Severity of Illness:  The appropriate patient status for this patient is OBSERVATION. Observation status is judged to be reasonable and necessary in order to provide the required intensity of service to ensure the patient's safety. The patient's presenting symptoms, physical exam findings, and initial radiographic and laboratory data in the context of their medical condition is felt to place them at decreased risk for further clinical deterioration. Furthermore, it is anticipated that the patient will be medically stable for discharge from the hospital within 2 midnights of admission.        Date of Service 09/08/2022    Lorretta Harp [facility]   If 7PM-7AM, please contact night-coverage [email or web address] 09/08/2022, 12:40 PM

## 2022-09-08 NOTE — ED Provider Notes (Signed)
[facility] Provider Note    Event Date/Time   First MD Initiated Contact with Patient 09/08/22 0021     (approximate)   History   Syncope   HPI  Level V caveat: Limited by altered mentation  Jodi Chavez is a 72 y.o. female brought to the ED via EMS from home with a chief complaint of syncope.  Patient states she recently got out of the hospital for "low oxygen".  Was on her couch when she passed out.  Does not remember the event.  EMS reports initial blood pressure 70s at the scene.  Patient complains of nausea.  Denies headache, chest pain, shortness of breath, abdominal pain, vomiting or diarrhea.     Past Medical History   Past Medical History:  Diagnosis Date   Arthritis    hip   CHF (congestive heart failure) (HCC)    Constipation    uses laxatives several times a week   Depression    Diabetes mellitus    takes Metformin and Amaryl daily   Dizziness    occasionally and related to meds    Early cataracts, bilateral    History of bronchitis    last time several yrs ago   History of migraine    many yrs ago   Hyperlipidemia    takes Zetia and Zocor daily   Hypertension    takes Benazepril nightly and Propranolol tid and Clonidine daily   Insomnia    Low back pain    Peripheral edema    Peripheral neuropathy    PONV (postoperative nausea and vomiting)    Slow urinary stream    occasionally     Active Problem List   Patient Active Problem List   Diagnosis Date Noted   Syncope 09/08/2022   Acute on chronic diastolic CHF (congestive heart failure) (HCC) 08/11/2022   Myocardial injury 08/11/2022   Type II diabetes mellitus with renal manifestations (HCC) 08/11/2022   Chronic kidney disease, stage 3a (HCC) 08/11/2022   Leukocytosis 08/11/2022   Hyponatremia 08/11/2022   Obesity (BMI 30-39.9) 08/11/2022   Pneumonia 05/27/2022   Near syncope 05/27/2022   Acute respiratory failure with hypoxia (HCC) 05/27/2022   Bilateral  carotid artery disease (HCC) 08/08/2020   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   Posterior vitreous detachment of left eye 02/01/2020   Posterior subcapsular age-related cataract of right eye 02/01/2020   Posterior vitreous detachment of right eye 02/01/2020   Nuclear sclerotic cataract of both eyes 02/01/2020   Posterior subcapsular polar age-related cataract of left eye 02/01/2020   Aortic atherosclerosis (HCC) 12/04/2019   Breakthrough COVID-19 10/07/2019   Elevated troponin 10/07/2019   Left knee pain 09/22/2015   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 06/23/2015   HLD (hyperlipidemia) 11/21/2014   Depression 11/21/2014   Nausea & vomiting 11/21/2014   Sepsis (HCC) 11/21/2014   Coughing 11/21/2014   UTI (lower urinary tract infection) 11/21/2014   CKD (chronic kidney disease), stage III (HCC) 11/21/2014   Nausea with vomiting    Cervical spondylosis with radiculopathy 10/15/2011    Class: Chronic   HNP (herniated nucleus pulposus), cervical 10/15/2011    Class: Chronic   Diabetes mellitus without complication (HCC) 09/30/2006   Essential hypertension 09/30/2006   DILATION AND CURETTAGE, HX OF 09/30/2006     Past Surgical History   Past Surgical History:  Procedure Laterality Date   COLONOSCOPY     d&c/hysteroscopy/ablation     DILATION AND CURETTAGE  OF UTERUS     couple of times   ESOPHAGOGASTRODUODENOSCOPY     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   POSTERIOR CERVICAL FUSION/FORAMINOTOMY  10/15/2011   Procedure: POSTERIOR CERVICAL FUSION/FORAMINOTOMY LEVEL 2;  Surgeon: Kerrin Champagne, MD;  Location: [facility] OR;  Service: Orthopedics;  Laterality: N/A;  Right C6-7, C7-T1 Foraminotomy with excision HNP C7-T1   UPPER GASTROINTESTINAL ENDOSCOPY       Home Medications   Prior to Admission medications   Medication Sig Start Date End Date Taking? Authorizing Provider  ALPRAZolam (XANAX) 0.25 MG tablet Take 0.25 mg by mouth at  bedtime as needed. 08/31/22  Yes [provider]  ascorbic acid (VITAMIN C) 500 MG tablet Take 500 mg by mouth daily.   Yes [provider]  aspirin 81 MG EC tablet Take 81 mg by mouth daily.   Yes [provider]  benazepril (LOTENSIN) 40 MG tablet Take 40 mg by mouth daily.   Yes [provider]  calcium-vitamin D (OSCAL WITH D) 500-200 MG-UNIT TABS tablet Take 1 tablet by mouth daily.   Yes [provider]  empagliflozin (JARDIANCE) 10 MG TABS tablet Take 1 tablet (10 mg total) by mouth daily. 08/18/22  Yes Lurene Shadow, MD  ezetimibe (ZETIA) 10 MG tablet Take 10 mg by mouth at bedtime.   Yes [provider]  gabapentin (NEURONTIN) 300 MG capsule Take 300 mg by mouth 3 (three) times daily.   Yes [provider]  Garlic 1000 MG CAPS Take 5,000 mg by mouth daily after breakfast.    Yes [provider]  Glucosamine-Chondroitin 500-400 MG CAPS Take 1 tablet by mouth daily.   Yes [provider]  magnesium oxide (MAG-OX) 400 MG tablet Take 400 mg by mouth daily. 08/27/22  Yes [provider]  metFORMIN (GLUCOPHAGE) 850 MG tablet Take 850 mg by mouth daily with breakfast.   Yes [provider]  metoprolol succinate (TOPROL-XL) 50 MG 24 hr tablet Take 50 mg by mouth daily.   Yes [provider]  Multiple Vitamin (MULITIVITAMIN WITH MINERALS) TABS Take 1 tablet by mouth daily after breakfast.    Yes [provider]  simvastatin (ZOCOR) 40 MG tablet Take 40 mg by mouth every evening.   Yes [provider]  torsemide (DEMADEX) 20 MG tablet Take 20 mg by mouth daily. 08/28/22 08/28/23 Yes [provider]  zinc sulfate 220 (50 Zn) MG capsule Take 220 mg by mouth daily.   Yes [provider]  furosemide (LASIX) 20 MG tablet Take 1 tablet (20 mg total) by mouth daily. Patient not taking: Reported on 09/08/2022 08/17/22   Lurene Shadow, MD  pantoprazole (PROTONIX) 40 MG  tablet Take 1 tablet (40 mg total) by mouth daily. Patient not taking: Reported on 09/08/2022 11/25/14   Cathren Harsh, MD  promethazine (PHENERGAN) 25 MG tablet Take 1 tablet (25 mg total) by mouth every 6 (six) hours as needed for nausea. Patient not taking: Reported on 09/08/2022 07/20/17   Molpus, Jonny Ruiz, MD  spironolactone (ALDACTONE) 25 MG tablet Take 0.5 tablets (12.5 mg total) by mouth daily. Patient not taking: Reported on 09/08/2022 08/18/22   Lurene Shadow, MD  DOXEPIN HCL PO Take by mouth.    05/10/11  [provider]  GABAPENTIN, PHN, PO Take by mouth.    05/10/11  [provider]     Allergies  Naproxen sodium   Family History   Family History  Problem Relation  Age of Onset   Cancer Mother    Stroke Mother    Diabetes Mother    Hypertension Mother    Colon cancer Mother 49       died 09-15-2005   Gout Father    Heart disease Father      Physical Exam  Triage Vital Signs: ED Triage Vitals  Encounter Vitals Group     BP      Systolic BP Percentile      Diastolic BP Percentile      Pulse      Resp      Temp      Temp src      SpO2      Weight      Height      Head Circumference      Peak Flow      Pain Score      Pain Loc      Pain Education      Exclude from Growth Chart     Updated Vital Signs: BP (!) 111/47   Pulse 80   Temp 97.7 F (36.5 C) (Oral)   Resp 19   Wt 86.6 kg Comment: From O/V note 08/31/22  SpO2 95%   BMI 32.77 kg/m    General: Awake, mild distress.  CV:  RRR.  Good peripheral perfusion.  Resp:  Normal effort.  CTAB. Abd:  Nontender.  No distention.  Other:  Alert and oriented to person and place.  Seems confused, groggy.  CN II-XII grossly intact.  5/5 motor strength and sensation all extremities.  MAE x 4.   ED Results / Procedures / Treatments  Labs (all labs ordered are listed, but only abnormal results are displayed) Labs Reviewed  CBC WITH DIFFERENTIAL/PLATELET - Abnormal; Notable for the following  components:      Result Value   RBC 3.55 (*)    Hemoglobin 11.2 (*)    HCT 34.3 (*)    All other components within normal limits  COMPREHENSIVE METABOLIC PANEL - Abnormal; Notable for the following components:   Sodium 132 (*)    Glucose, Bld 157 (*)    BUN 28 (*)    Creatinine, Ser 1.41 (*)    GFR, Estimated 40 (*)    All other components within normal limits  URINALYSIS, W/ REFLEX TO CULTURE (INFECTION SUSPECTED) - Abnormal; Notable for the following components:   Color, Urine YELLOW (*)    APPearance CLEAR (*)    Glucose, UA >=500 (*)    All other components within normal limits  BRAIN NATRIURETIC PEPTIDE - Abnormal; Notable for the following components:   B Natriuretic Peptide 927.4 (*)    All other components within normal limits  TROPONIN I (HIGH SENSITIVITY) - Abnormal; Notable for the following components:   Troponin I (High Sensitivity) 28 (*)    All other components within normal limits  TROPONIN I (HIGH SENSITIVITY) - Abnormal; Notable for the following components:   Troponin I (High Sensitivity) 28 (*)    All other components within normal limits  CULTURE, BLOOD (ROUTINE X 2)  CULTURE, BLOOD (ROUTINE X 2)  SARS CORONAVIRUS 2 BY RT PCR  LACTIC ACID, PLASMA  LACTIC ACID, PLASMA     EKG  ED ECG REPORT I, Kaiesha Tonner J, the attending physician, personally viewed and interpreted this ECG.   Date: 09/08/2022  EKG Time: 0024  Rate: 79  Rhythm: normal sinus rhythm  Axis: RAD  Intervals:none  ST&T Change: Nonspecific  RADIOLOGY I have independently visualized and interpreted patient's CT head and chest x-ray as well as noted the radiology interpretation:  CT head: No ICH  Chest x-ray: No acute cardiopulmonary process  Official radiology report(s): DG Chest Port 1 View  Result Date: 09/08/2022 CLINICAL DATA:  Altered mental status EXAM: PORTABLE CHEST 1 VIEW COMPARISON:  Radiographs 08/14/2022 FINDINGS: The lung volumes accentuate cardiomediastinal  silhouette and pulmonary vascularity. Aortic atherosclerotic calcification. No focal consolidation, pleural effusion, or pneumothorax. No displaced rib fractures. IMPRESSION: Low lung volumes.  No active disease. Electronically Signed   By: Minerva Fester M.D.   On: 09/08/2022 01:06   CT Head Wo Contrast  Result Date: 09/08/2022 CLINICAL DATA:  Mental status change EXAM: CT HEAD WITHOUT CONTRAST TECHNIQUE: Contiguous axial images were obtained from the base of the skull through the vertex without intravenous contrast. RADIATION DOSE REDUCTION: This exam was performed according to the departmental dose-optimization program which includes automated exposure control, adjustment of the mA and/or kV according to patient size and/or use of iterative reconstruction technique. COMPARISON:  CT head 05/27/2022 FINDINGS: Brain: No intracranial hemorrhage, mass effect, or evidence of acute infarct. No hydrocephalus. No extra-axial fluid collection. Vascular: No hyperdense vessel or unexpected calcification. Skull: No fracture or focal lesion. Sinuses/Orbits: No acute finding. Paranasal sinuses and mastoid air cells are well aerated. Other: None. IMPRESSION: No acute intracranial abnormality. Electronically Signed   By: Minerva Fester M.D.   On: 09/08/2022 00:49     PROCEDURES:  Critical Care performed: Yes, see critical care procedure note(s)  CRITICAL CARE Performed by: Irean Hong   Total critical care time: 30 minutes  Critical care time was exclusive of separately billable procedures and treating other patients.  Critical care was necessary to treat or prevent imminent or life-threatening deterioration.  Critical care was time spent personally by me on the following activities: development of treatment plan with patient and/or surrogate as well as nursing, discussions with consultants, evaluation of patient's response to treatment, examination of patient, obtaining history from patient or surrogate,  ordering and performing treatments and interventions, ordering and review of laboratory studies, ordering and review of radiographic studies, pulse oximetry and re-evaluation of patient's condition.   Marland Kitchen1-3 Lead EKG Interpretation  Performed by: Irean Hong, MD Authorized by: Irean Hong, MD     Interpretation: normal     ECG rate:  80   ECG rate assessment: normal     Rhythm: sinus rhythm     Ectopy: none     Conduction: normal   Comments:     Placed on cardiac monitor to evaluate for arrhythmias    MEDICATIONS ORDERED IN ED: Medications  ezetimibe (ZETIA) tablet 10 mg (has no administration in time range)  metoprolol succinate (TOPROL-XL) 24 hr tablet 50 mg (has no administration in time range)  simvastatin (ZOCOR) tablet 40 mg (has no administration in time range)  ALPRAZolam (XANAX) tablet 0.25 mg (has no administration in time range)  torsemide (DEMADEX) tablet 20 mg (has no administration in time range)  spironolactone (ALDACTONE) tablet 12.5 mg (has no administration in time range)  empagliflozin (JARDIANCE) tablet 10 mg (has no administration in time range)  magnesium oxide (MAG-OX) tablet 400 mg (has no administration in time range)  pantoprazole (PROTONIX) EC tablet 40 mg (has no administration in time range)  gabapentin (NEURONTIN) capsule 300 mg (has no administration in time range)  ascorbic acid (VITAMIN C) tablet 500 mg (has no administration in time range)  calcium-vitamin D (OSCAL  WITH D) 500-5 MG-MCG per tablet 1 tablet (has no administration in time range)  multivitamin with minerals tablet 1 tablet (has no administration in time range)  zinc sulfate capsule 220 mg (has no administration in time range)  sodium chloride flush (NS) 0.9 % injection 3 mL (has no administration in time range)  enoxaparin (LOVENOX) injection 45 mg (has no administration in time range)  0.9 %  sodium chloride infusion ( Intravenous New Bag/Given 09/08/22 0601)  acetaminophen (TYLENOL)  tablet 650 mg (has no administration in time range)    Or  acetaminophen (TYLENOL) suppository 650 mg (has no administration in time range)  traZODone (DESYREL) tablet 25 mg (has no administration in time range)  magnesium hydroxide (MILK OF MAGNESIA) suspension 30 mL (has no administration in time range)  ondansetron (ZOFRAN) tablet 4 mg (has no administration in time range)    Or  ondansetron (ZOFRAN) injection 4 mg (has no administration in time range)  lactated ringers bolus 1,000 mL (1,000 mLs Intravenous New Bag/Given 09/08/22 0059)  ondansetron (ZOFRAN) injection 4 mg (4 mg Intravenous Given 09/08/22 0059)     IMPRESSION / MDM / ASSESSMENT AND PLAN / ED COURSE  I reviewed the triage vital signs and the nursing notes.                             72 year old female brought for weakness, syncope. Differential diagnosis includes, but is not limited to, alcohol, illicit or prescription medications, or other toxic ingestion; intracranial pathology such as stroke or intracerebral hemorrhage; fever or infectious causes including sepsis; hypoxemia and/or hypercarbia; uremia; trauma; endocrine related disorders such as diabetes, hypoglycemia, and thyroid-related diseases; hypertensive encephalopathy; etc. personally reviewed patient's records and note a PCP office visit from 08/31/2022 for transient insomnia, CAD, CHF.  The hospitalization she mentioned was from 6/15 - 08/17/2022 for respiratory failure secondary to acute on chronic CHF.  Patient's presentation is most consistent with acute presentation with potential threat to life or bodily function.  The patient is on the cardiac monitor to evaluate for evidence of arrhythmia and/or significant heart rate changes.  Will obtain sepsis and cardiac panels, CT head, chest x-ray.  Initiate IV fluid resuscitation.  Anticipate hospitalization.  Clinical Course as of 09/08/22 0603  Sat Sep 08, 2022  0310 Afebrile, UA negative.  Blood pressure stable.   Will consult hospital services for evaluation and admission. [JS]    Clinical Course User Index [JS] Irean Hong, MD     FINAL CLINICAL IMPRESSION(S) / ED DIAGNOSES   Final diagnoses:  Weakness generalized  Syncope, unspecified syncope type  Altered mental status, unspecified altered mental status type     Rx / DC Orders   ED Discharge Orders     None        Note:  This document was prepared using Dragon voice recognition software and may include unintentional dictation errors.   Irean Hong, MD 09/08/22 [phone]

## 2022-09-09 DIAGNOSIS — J9601 Acute respiratory failure with hypoxia: Secondary | ICD-10-CM

## 2022-09-09 DIAGNOSIS — R55 Syncope and collapse: Secondary | ICD-10-CM | POA: Diagnosis not present

## 2022-09-09 DIAGNOSIS — I10 Essential (primary) hypertension: Secondary | ICD-10-CM | POA: Diagnosis not present

## 2022-09-09 DIAGNOSIS — I5032 Chronic diastolic (congestive) heart failure: Secondary | ICD-10-CM | POA: Diagnosis not present

## 2022-09-09 LAB — BLOOD CULTURE ID PANEL (REFLEXED) - BCID2

## 2022-09-09 LAB — CBC
HCT: 31.2 % — ABNORMAL LOW (ref 36.0–46.0)
Hemoglobin: 10.3 g/dL — ABNORMAL LOW (ref 12.0–15.0)
MCH: 32.2 pg (ref 26.0–34.0)
MCHC: 33 g/dL (ref 30.0–36.0)
MCV: 97.5 fL (ref 80.0–100.0)
Platelets: 225 10*3/uL (ref 150–400)
RBC: 3.2 MIL/uL — ABNORMAL LOW (ref 3.87–5.11)
RDW: 13.5 % (ref 11.5–15.5)
WBC: 8.3 10*3/uL (ref 4.0–10.5)
nRBC: 0 % (ref 0.0–0.2)

## 2022-09-09 LAB — GLUCOSE, CAPILLARY
Glucose-Capillary: 110 mg/dL — ABNORMAL HIGH (ref 70–99)
Glucose-Capillary: 96 mg/dL (ref 70–99)
Glucose-Capillary: 159 mg/dL — ABNORMAL HIGH (ref 70–99)
Glucose-Capillary: 135 mg/dL — ABNORMAL HIGH (ref 70–99)
Glucose-Capillary: 113 mg/dL — ABNORMAL HIGH (ref 70–99)

## 2022-09-09 LAB — BASIC METABOLIC PANEL
Anion gap: 8 (ref 5–15)
BUN: 25 mg/dL — ABNORMAL HIGH (ref 8–23)
CO2: 28 mmol/L (ref 22–32)
Calcium: 9.4 mg/dL (ref 8.9–10.3)
Chloride: 98 mmol/L (ref 98–111)
Creatinine, Ser: 1.33 mg/dL — ABNORMAL HIGH (ref 0.44–1.00)
GFR, Estimated: 43 mL/min — ABNORMAL LOW (ref >60)
Glucose, Bld: 113 mg/dL — ABNORMAL HIGH (ref 70–99)
Potassium: 4.3 mmol/L (ref 3.5–5.1)
Sodium: 134 mmol/L — ABNORMAL LOW (ref 135–145)

## 2022-09-09 MED ORDER — POLYETHYLENE GLYCOL 3350 17 G PO PACK
17.0000 g | PACK | Freq: Every day | ORAL | Status: DC
  Administered 2022-09-09 – 2022-09-10 (×2): 17 g via ORAL
  Filled 2022-09-09 (×2): qty 1

## 2022-09-09 MED ORDER — ENOXAPARIN SODIUM 40 MG/0.4ML IJ SOSY
40.0000 mg | PREFILLED_SYRINGE | INTRAMUSCULAR | Status: DC
  Administered 2022-09-09 – 2022-09-11 (×3): 40 mg via SUBCUTANEOUS
  Filled 2022-09-09 (×2): qty 0.4

## 2022-09-09 MED ORDER — MUSCLE RUB 10-15 % EX CREA
TOPICAL_CREAM | CUTANEOUS | Status: DC | PRN
  Filled 2022-09-09: qty 85

## 2022-09-09 NOTE — Progress Notes (Signed)
PHARMACY - PHYSICIAN COMMUNICATION CRITICAL VALUE ALERT - BLOOD CULTURE IDENTIFICATION (BCID)  Jodi Chavez is an 71 y.o. female who presented to [facility] on 09/08/2022 with a chief complaint of syncopal episode  Assessment:  1/4 (aerobic) GPC: MRSE    Name of physician (or Provider) Contacted: Dr. Denton Lank, Tresa Endo  Current antibiotics: none  Changes to prescribed antibiotics recommended: Suspected contaminant, IF wanting to treat recommend adding Vancomycin Pending: MD to assess patient first   Results for orders placed or performed during the hospital encounter of 09/08/22  Blood Culture ID Panel (Reflexed) (Collected: 09/08/2022 12:35 AM)  Result Value Ref Range   Enterococcus faecalis NOT DETECTED NOT DETECTED   Enterococcus Faecium NOT DETECTED NOT DETECTED   Listeria monocytogenes NOT DETECTED NOT DETECTED   Staphylococcus species DETECTED (A) NOT DETECTED   Staphylococcus aureus (BCID) NOT DETECTED NOT DETECTED   Staphylococcus epidermidis DETECTED (A) NOT DETECTED   Staphylococcus lugdunensis NOT DETECTED NOT DETECTED   Streptococcus species NOT DETECTED NOT DETECTED   Streptococcus agalactiae NOT DETECTED NOT DETECTED   Streptococcus pneumoniae NOT DETECTED NOT DETECTED   Streptococcus pyogenes NOT DETECTED NOT DETECTED   A.calcoaceticus-baumannii NOT DETECTED NOT DETECTED   Bacteroides fragilis NOT DETECTED NOT DETECTED   Enterobacterales NOT DETECTED NOT DETECTED   Enterobacter cloacae complex NOT DETECTED NOT DETECTED   Escherichia coli NOT DETECTED NOT DETECTED   Klebsiella aerogenes NOT DETECTED NOT DETECTED   Klebsiella oxytoca NOT DETECTED NOT DETECTED   Klebsiella pneumoniae NOT DETECTED NOT DETECTED   Proteus species NOT DETECTED NOT DETECTED   Salmonella species NOT DETECTED NOT DETECTED   Serratia marcescens NOT DETECTED NOT DETECTED   Haemophilus influenzae NOT DETECTED NOT DETECTED   Neisseria meningitidis NOT DETECTED NOT DETECTED   Pseudomonas  aeruginosa NOT DETECTED NOT DETECTED   Stenotrophomonas maltophilia NOT DETECTED NOT DETECTED   Candida albicans NOT DETECTED NOT DETECTED   Candida auris NOT DETECTED NOT DETECTED   Candida glabrata NOT DETECTED NOT DETECTED   Candida krusei NOT DETECTED NOT DETECTED   Candida parapsilosis NOT DETECTED NOT DETECTED   Candida tropicalis NOT DETECTED NOT DETECTED   Cryptococcus neoformans/gattii NOT DETECTED NOT DETECTED   Methicillin resistance mecA/C DETECTED (A) NOT DETECTED    Sharen Hones, PharmD, BCPS Clinical Pharmacist   09/09/2022  8:07 AM

## 2022-09-09 NOTE — Assessment & Plan Note (Signed)
-   Continue home medications 

## 2022-09-09 NOTE — Evaluation (Signed)
Occupational Therapy Evaluation Patient Details Name: Jodi Chavez MRN: [id number] DOB: [birth year] Today's Date: 09/09/2022   History of Present Illness Jodi Chavez is a 72 y.o. 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 Patient was recently hospitalized from 6/15 - 6/21 due to CHF exacerbation. Patient was discharged to rehab facility, just released to home from rehab. Per report, pt was on her couch when she passed out at about 11:00 PM last night.  Patient has shortness of breath, no chest pain, cough.  Patient has nausea, no vomiting, diarrhea or abdominal pain.  Denies symptoms of UTI.  She states that she passed out for approximately 2 minutes.  She does not have unilateral numbness or tinglings in extremities.  No facial droop or slurred speech.  Patient moves all extremities normally.   Clinical Impression   Jodi Chavez presents with generalized weakness and limited endurance. She returns to the hospital within a few hours of discharging to home from [name], following a hospitalization from 6/15-6/21. During today's session, pt requires Min A for bed mobility; however, she reports she sleeps in a recliner at home and has long found it difficult to get out of a bed. She requires Max A for donning socks, reports that at home she is able to do this with Mod I, using a sock aide. Pt demonstrates good standing balance, no SOB on 2 L O2 and then without O2. She is able to perform self-feeding and grooming in sitting with Mod I. With her DC from the SNL, pt had HH set to follow her at home. Recommend to continue with this plan following hospital DC.   Recommendations for follow up therapy are one component of a multi-disciplinary discharge planning process, led by the attending physician.  Recommendations may be updated based on patient  status, additional functional criteria and insurance authorization.   Assistance Recommended at Discharge Intermittent Supervision/Assistance  Patient can return home with the following A little help with bathing/dressing/bathroom;Assistance with cooking/housework;Assist for transportation    Functional Status Assessment  Patient has had a recent decline in their functional status and demonstrates the ability to make significant improvements in function in a reasonable and predictable amount of time.  Equipment Recommendations  None recommended by OT    Recommendations for Other Services       Precautions / Restrictions Precautions Precautions: Fall Restrictions Weight Bearing Restrictions: No      Mobility Bed Mobility Overal bed mobility: Needs Assistance Bed Mobility: Supine to Sit, Sit to Supine     Supine to sit: Min assist Sit to supine: Supervision   General bed mobility comments: Pt reports she sleeps in recliner at home; she requires Min A for moving supine<sit and endorses back pain with this movement    Transfers Overall transfer level: Needs assistance Equipment used: Rolling walker (2 wheels) Transfers: Sit to/from Stand Sit to Stand: Supervision                  Balance Overall balance assessment: Needs assistance Sitting-balance support: Bilateral upper extremity supported Sitting balance-Leahy Scale: Good     Standing balance support: Bilateral upper extremity supported Standing balance-Leahy Scale: Good                             ADL either performed or assessed with clinical judgement  ADL Overall ADL's : Needs assistance/impaired Eating/Feeding: Modified independent Eating/Feeding Details (indicate cue type and reason): pt requires increased time for swallowing Grooming: Sitting;Modified independent               Lower Body Dressing: Maximal assistance Lower Body Dressing Details (indicate cue type and reason): pt  reports using sock aide at home                     Vision Patient Visual Report: No change from baseline       Perception     Praxis      Pertinent Vitals/Pain Pain Assessment Pain Assessment: Faces Faces Pain Scale: Hurts little more Pain Location: lower back Pain Descriptors / Indicators: Aching Pain Intervention(s): Monitored during session, Repositioned, Other (comment) (pt requests pain patch or lotion, RN notified)     Hand Dominance Right   Extremity/Trunk Assessment Upper Extremity Assessment Upper Extremity Assessment: Overall WFL for tasks assessed   Lower Extremity Assessment Lower Extremity Assessment: Overall WFL for tasks assessed       Communication Communication Communication: No difficulties   Cognition Arousal/Alertness: Awake/alert Behavior During Therapy: WFL for tasks assessed/performed Overall Cognitive Status: Within Functional Limits for tasks assessed                                       General Comments       Exercises Other Exercises Other Exercises: Educ re: DC recs, ECS, falls prevention   Shoulder Instructions      Home Living Family/patient expects to be discharged to:: Private residence Living Arrangements: Other relatives Available Help at Discharge: Family Type of Home: House Home Access: Ramped entrance     Home Layout: One level     Bathroom Shower/Tub: Chief Strategy Officer: Handicapped height Bathroom Accessibility: No   Home Equipment: Agricultural consultant (2 wheels)   Additional Comments: Pt reports she moved in with her sister & nephew in 2016.      Prior Functioning/Environment Prior Level of Function : Needs assist             Mobility Comments: Pt reports she ambulates household distances with RW, denies falls. ADLs Comments: Sponge bathes as she has started having trouble stepping over into shower, sleeps in recliner        OT Problem List: Decreased  strength;Decreased activity tolerance;Impaired balance (sitting and/or standing)      OT Treatment/Interventions:      OT Goals(Current goals can be found in the care plan section) Acute Rehab OT Goals Patient Stated Goal: to go back to her sister's house OT Goal Formulation: With patient Time For Goal Achievement: 09/23/22 Potential to Achieve Goals: Good  OT Frequency:      Co-evaluation              AM-PAC OT "6 Clicks" Daily Activity     Outcome Measure Help from another person eating meals?: None Help from another person taking care of personal grooming?: A Little Help from another person toileting, which includes using toliet, bedpan, or urinal?: A Little Help from another person bathing (including washing, rinsing, drying)?: A Little Help from another person to put on and taking off regular upper body clothing?: None Help from another person to put on and taking off regular lower body clothing?: A Lot 6 Click Score: 19   End of Session Equipment Utilized During  Treatment: Rolling walker (2 wheels)  Activity Tolerance: Patient tolerated treatment well Patient left: in bed;with nursing/sitter in room;Other (comment) (w/ PT in room)  OT Visit Diagnosis: Unsteadiness on feet (R26.81);Muscle weakness (generalized) (M62.81)                Time: 4098-1191 OT Time Calculation (min): 35 min Charges:  OT Evaluation $OT Eval Low Complexity: 1 Low OT Treatments $Self Care/Home Management : 8-22 mins Latina Craver, PhD, MS, OTR/L 09/09/22, 11:00 AM

## 2022-09-09 NOTE — Assessment & Plan Note (Addendum)
Echo on 08/12/2022 showed EF of 50-50%.  BNP 927, no lower extremity edema, clear chest x-ray.  Seems euvolemic to dry on exam. --HOLD torsemide and spironolactone due to rising Cr today (7/15) --No need for IV diuretics, monitor closely  --Daily weights --Will need close cardiology follow up

## 2022-09-09 NOTE — Assessment & Plan Note (Signed)
--  Zocor and zetia

## 2022-09-09 NOTE — Hospital Course (Addendum)
HPI on admission 09/08/22 by Dr. Clyde Lundborg: "Jodi Chavez is a 72 y.o. 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   Patient was recently hospitalized from 6/15 - 6/21 due to CHF exacerbation.  Patient was discharged to rehab facility, just released to home from rehab. Per report, pt was on her couch when she passed out at about 11:00 PM last night.  Patient has shortness of breath, no chest pain, cough.  Patient has nausea, no vomiting, diarrhea or abdominal pain.  Denies symptoms of UTI.  She states that she passed out for approximately 2 minutes.  She does not have unilateral numbness or tinglings in extremities.  No facial droop or slurred speech.  Patient moves all extremities normally.   EMS reports initial blood pressure was in 70s at the scene.  She received 1 L LR bolus in ED.  Blood pressure is 90/49 and  117/51 when I saw patient in ED.   Data reviewed independently and ED Course: pt was found to have trop 28 --> 28, BNP 927, Positive D-dimer 1.38, WBC 9.7, stable renal function, negative COVID PCR, temperature normal, heart rate 88, RR 19, blood pressure 117/51 currently, oxygen saturation 96% on 3 L oxygen (no documented oxygen desaturation).  X-ray negative.  CT head negative for acute intracranial abnormalities.  CTA negative for PE.  Lower extremity venous Doppler negative for DVT.  Patient is placed on telemetry bed for observation."  Further hospital course & management as outlined below.  7/14 -- O2 desaturations with PT, as low as 79% on room air, needing 2 L/min. Lightheadedness with activity.

## 2022-09-09 NOTE — Assessment & Plan Note (Signed)
Trop 28 --> 28, no CP.  Not consistent with ACS. --stat EKG & repeat troponin if chest pain

## 2022-09-09 NOTE — Assessment & Plan Note (Signed)
Recent A1c 6.4, well-controlled.  Patient is taking Jardiance at home -Sliding scale insulin

## 2022-09-09 NOTE — Evaluation (Signed)
Physical Therapy Evaluation Patient Details Name: Jodi Chavez MRN: [id number] DOB: 20-Jan-1951 Today's Date: 09/09/2022  History of Present Illness  Jodi Chavez is a 72 y.o. 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 Patient was recently hospitalized from 6/15 - 6/21 due to CHF exacerbation.  Patient was discharged to rehab facility, just released to home from rehab. Per report, pt was on her couch when she passed out at about 11:00 PM at night. CT head negative.  No focal neurodeficit on physical examination, low suspicions for stroke.  Pt has SOB and reportedly patient had hypotension, PE is a potential differential diagnosis, but CTA negative for PE.   Clinical Impression  Patient received sitting at edge of bed after finishing OT evaluation. OT communicated that she experienced orthostatic hypotension when measured going from sit to stand. Patient requesting to go to the bathroom. Patient on room air that RN said they were trialing during OT evaluation. Patient reports she lives with her sister and nephew. Her nephew takes care of her sister and they both assist with housework, meals, and getting groceries. Prior to hospitalization, patient was at a rehab facility for 3 weeks getting stronger. She was able to ambulate 100 feet with a RW and her SpO2 was in the 90s on room air for at least 2 days prior to discharging home which resulted in her losing consciousness and returning to the hospital. Upon PT evaluation, patient needed supervision with cuing to improve body and RW positioning during transfers and ambulation mobility. She fatigued quickly and complained of feeling light headed. She was found to have SpO2 down to 79% directly after mobility to bathroom that including patient completing her own pericare and handwashing. She was  unable to let go of UE support while standing and continued to lean heavily on the sink while washing her hands. PT provided 2L/min O2 (pt's request to be 2L/min instead of 1L/min) for the rest of the session including ambulation in the hallway which resulted in O2 sat being in the high 90s. Patient did not have any syncopal episodes during session but did continue to complain of feeling lightheaded. She frequently expressed apprehension about going home without understanding "what is wrong with me" and why she passed out at home. She does not want O2 at home but noted that her SpO2 was low when she passed out at home. Patient demonstrates a decline in functional independence and strength. Patient would benefit from skilled physical therapy to address impairments and functional limitations (see PT Problem List below) to work towards stated goals and return to PLOF or maximal functional independence.     Assistance Recommended at Discharge Frequent or constant Supervision/Assistance  If plan is discharge home, recommend the following:  Can travel by private vehicle  A little help with walking and/or transfers;A little help with bathing/dressing/bathroom;Assistance with cooking/housework;Help with stairs or ramp for entrance        Equipment Recommendations BSC/3in1  Recommendations for Other Services       Functional Status Assessment Patient has had a recent decline in their functional status and demonstrates the ability to make significant improvements in function in a reasonable and predictable amount of time.     Precautions / Restrictions Precautions Precautions: Fall Restrictions Weight Bearing Restrictions: No      Mobility  Bed Mobility  General bed mobility comments: Bed mobility not observed by PT as patient is already sitting at edge of bed after OT session. OT documentation reports pt needs min A for bed mobiltiy but usually sleeps in recliner.     Transfers Overall transfer level: Needs assistance Equipment used: Rolling walker (2 wheels) Transfers: Sit to/from Stand Sit to Stand: Supervision           General transfer comment: patient complains of feeling dizzy and light headed, takes prolonged time to transfer    Ambulation/Gait Ambulation/Gait assistance: Supervision Gait Distance (Feet): 80 Feet Assistive device: Rolling walker (2 wheels) Gait Pattern/deviations: Decreased stride length, Trunk flexed Gait velocity: very slow     General Gait Details: Patient ambulates slowly 80 feet with RW, supervision, and 2L/min O2. Patient requires cuing to continue walking and monitor fatigue level. 2L/min O2 applied prior to walk when SpO2 was found to be 79-83% on room air after mobility to bathroom. Pateint continued to report feeling dizzy and a tightness in her chest after taking medications. SpO2 stayed in high 90s with 2L/min O2. PT to try 1L/min but pt requested to stay at 2L/min despite saying she did not want it at home.  Stairs            Wheelchair Mobility     Tilt Bed    Modified Rankin (Stroke Patients Only)       Balance Overall balance assessment: Needs assistance Sitting-balance support: Bilateral upper extremity supported Sitting balance-Leahy Scale: Good     Standing balance support: Bilateral upper extremity supported Standing balance-Leahy Scale: Fair Standing balance comment: dependent on B UE support on RW or sink for balance during standing, leans on sink while washing hands.                             Pertinent Vitals/Pain Pain Assessment Pain Assessment: Faces Faces Pain Scale: Hurts a little bit Pain Location: states back and knees hurt at times Pain Descriptors / Indicators: Discomfort Pain Intervention(s): Limited activity within patient's tolerance, Monitored during session, Repositioned    Home Living Family/patient expects to be discharged to:: Private  residence Living Arrangements: Other relatives (sister who is 2 years older and nephew who works and takes care of patient's sister) Available Help at Discharge: Family;Available PRN/intermittently Type of Home: House Home Access: Ramped entrance       Home Layout: One level Home Equipment: Rolling Walker (2 wheels) Additional Comments: Pt reports she moved in with her sister & nephew in 2016.    Prior Function Prior Level of Function : Needs assist             Mobility Comments: Pt reports she ambulates household distances with RW, denies falls. She states [facility]. She was last walking 100 feet with RW there and her SpO2 was in the 90s on room air for the last couple of days before going home. ADLs Comments: Sponge bathes as she has started having trouble stepping over into shower, sleeps in recliner.     Hand Dominance   Dominant Hand: Right    Extremity/Trunk Assessment   Upper Extremity Assessment Upper Extremity Assessment: Generalized weakness    Lower Extremity Assessment Lower Extremity Assessment: Generalized weakness    Cervical / Trunk Assessment Cervical / Trunk Assessment: Normal  Communication   Communication: No difficulties  Cognition Arousal/Alertness: Awake/alert Behavior During Therapy: WFL for  tasks assessed/performed, Anxious Overall Cognitive Status: Within Functional Limits for tasks assessed                                 General Comments: patient anxious at times and requests items be positioned certain way or asks for further rest time or time to keep wiping after toileting even when without checking wipe and it is clean per PT observation.        General Comments General comments (skin integrity, edema, etc.): Pateint's SpO2 found to be 79-83% on room air after bathroom mobility, so 2L/min was applied for remainder of session with RN verbal approval and patient's SpO2  stayed in high 90s%.    Exercises Other Exercises Other Exercises: supervision assistance with transfer from bed to toilet and back, pericare, and washing hands. Education on role of PT in acute care setting.   Assessment/Plan    PT Assessment Patient needs continued PT services  PT Problem List Decreased strength;Decreased mobility;Decreased safety awareness;Decreased activity tolerance;Cardiopulmonary status limiting activity;Decreased balance;Decreased knowledge of use of DME;Pain       PT Treatment Interventions DME instruction;Therapeutic exercise;Gait training;Balance training;Stair training;Neuromuscular re-education;Functional mobility training;Therapeutic activities;Patient/family education    PT Goals (Current goals can be found in the Care Plan section)  Acute Rehab PT Goals Patient Stated Goal: to find out what is wrong with her and why she passed out before going home PT Goal Formulation: With patient Time For Goal Achievement: 09/23/22 Potential to Achieve Goals: Good    Frequency Min 1X/week     Co-evaluation               AM-PAC PT "6 Clicks" Mobility  Outcome Measure Help needed turning from your back to your side while in a flat bed without using bedrails?: A Little Help needed moving from lying on your back to sitting on the side of a flat bed without using bedrails?: A Little Help needed moving to and from a bed to a chair (including a wheelchair)?: A Little Help needed standing up from a chair using your arms (e.g., wheelchair or bedside chair)?: A Little Help needed to walk in hospital room?: A Little Help needed climbing 3-5 steps with a railing? : A Lot 6 Click Score: 17    End of Session Equipment Utilized During Treatment: Gait belt;Oxygen Activity Tolerance: Patient limited by fatigue;Patient tolerated treatment well Patient left: in chair;with call bell/phone within reach;with chair alarm set Nurse Communication: Mobility status PT Visit  Diagnosis: Difficulty in walking, not elsewhere classified (R26.2);Muscle weakness (generalized) (M62.81)    Time: 4098-1191 PT Time Calculation (min) (ACUTE ONLY): 50 min   Charges:   PT Evaluation $PT Eval Moderate Complexity: 1 Mod PT Treatments $Gait Training: 8-22 mins $Therapeutic Activity: 8-22 mins PT General Charges $$ ACUTE PT VISIT: 1 Visit         Huntley Dec R. Ilsa Iha, PT, DPT 09/09/22, 11:46 AM

## 2022-09-09 NOTE — Assessment & Plan Note (Addendum)
Hypotension - EMS found BP in 70's  --IV hydralazine as needed --Continue metoprolol --Holding torsemide, spironolactone today with Cr incrase --Hold benazepril and resume when appropriate (initially held for soft BP's,now rising Cr)

## 2022-09-09 NOTE — Assessment & Plan Note (Signed)
Body mass index is 35.41 kg/m. Complicates overall care and prognosis.  Recommend lifestyle modifications including physical activity and diet for weight loss and overall long-term health.

## 2022-09-09 NOTE — Assessment & Plan Note (Addendum)
Etiology is not clear, broad differential. Most likely hypotension or orthostatic hypotension given EMS found pt's BP in 70's on their arrival.   CT head negative.   No focal neurodeficit on physical examination, low suspicions for stroke.  Pt has SOB and reportedly patient had hypotension, PE is a potential differential diagnosis, but CTA negative for PE.    7/14 - orthostatics show BP drops but HR does not increase (144/58 HR 91, 141/64, HR 91, 115/67 HR 95)  -- Daily orthostatic vital signs  -- Neuro checks  -- s/p IVF: 1L of NS -- PT/OT eval and treat -- Fall precautions  Will d/c on Torsemide AS NEEDED -- pt was hypotensive and syncopal. --Close Cardiology follow up recommended

## 2022-09-09 NOTE — Assessment & Plan Note (Signed)
7/14: O2 sats drop to 79% on room air with activity during PT (see PT note). Requiring 2 L/min O2 with activity. No baseline O2 requirement. Low lung volumes on CXR. PE ruled out by CTA chest. CTA did show some ground glass opacities - pulmonary edema vs multifocal infection, but pt afebrile, no leukocytosis, no respiratory symptoms. --Supplement O2 --Target O2 sat > 90% --Incentive spirometer

## 2022-09-09 NOTE — Progress Notes (Addendum)
Progress Note   Patient: Jodi Chavez NWG:[id number] DOB: April 16, 1950 DOA: 09/08/2022     0 DOS: the patient was seen and examined on 09/09/2022   Brief hospital course: HPI on admission 09/08/22 by Dr. Clyde Lundborg: "Jodi Chavez is a 72 y.o. 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   Patient was recently hospitalized from 6/15 - 6/21 due to CHF exacerbation.  Patient was discharged to rehab facility, just released to home from rehab. Per report, pt was on her couch when she passed out at about 11:00 PM last night.  Patient has shortness of breath, no chest pain, cough.  Patient has nausea, no vomiting, diarrhea or abdominal pain.  Denies symptoms of UTI.  She states that she passed out for approximately 2 minutes.  She does not have unilateral numbness or tinglings in extremities.  No facial droop or slurred speech.  Patient moves all extremities normally.   EMS reports initial blood pressure was in 70s at the scene.  She received 1 L LR bolus in ED.  Blood pressure is 90/49 and  117/51 when I saw patient in ED.   Data reviewed independently and ED Course: pt was found to have trop 28 --> 28, BNP 927, Positive D-dimer 1.38, WBC 9.7, stable renal function, negative COVID PCR, temperature normal, heart rate 88, RR 19, blood pressure 117/51 currently, oxygen saturation 96% on 3 L oxygen (no documented oxygen desaturation).  X-ray negative.  CT head negative for acute intracranial abnormalities.  CTA negative for PE.  Lower extremity venous Doppler negative for DVT.  Patient is placed on telemetry bed for observation."  Further hospital course & management as outlined below.  7/14 -- O2 desaturations with PT, as low as 79% on room air, needing 2 L/min. Lightheadedness with activity.  Assessment and Plan: * Syncope Etiology is not clear, broad  differential. Most likely hypotension or orthostatic hypotension given EMS found pt's BP in 70's on their arrival.   CT head negative.   No focal neurodeficit on physical examination, low suspicions for stroke.  Pt has SOB and reportedly patient had hypotension, PE is a potential differential diagnosis, but CTA negative for PE.    7/14 - orthostatics show BP drops but HR does not increase (144/58 HR 91, 141/64, HR 91, 115/67 HR 95)  -- Daily orthostatic vital signs  -- Neuro checks  -- s/p IVF: 1L of NS -- PT/OT eval and treat -- Fall precautions  Acute respiratory failure with hypoxia (HCC) 7/14: O2 sats drop to 79% on room air with activity during PT (see PT note). Requiring 2 L/min O2 with activity. No baseline O2 requirement. Low lung volumes on CXR. PE ruled out by CTA chest. CTA did show some ground glass opacities - pulmonary edema vs multifocal infection, but pt afebrile, no leukocytosis, no respiratory symptoms. --Supplement O2 --Target O2 sat > 90% --Incentive spirometer  Myocardial injury Trop 28 --> 28, no CP.  Not consistent with ACS. --stat EKG & repeat troponin if chest pain  Essential hypertension Hypotension - EMS found BP in 70's  --IV hydralazine as needed --Continue metoprolol, torsemide, spironolactone  --Hold Lotensin until BP improves  Chronic diastolic CHF (congestive heart failure) (HCC) Echo on 08/12/2022 showed EF of 50-50%.  BNP 927, no lower extremity edema, clear chest x-ray.  Seems compensated to dry on exam. --Continue home torsemide and spironolactone --No need  for IV diuretics, monitor closely  --Daily weights  HLD (hyperlipidemia) --Zocor and zetia   Chronic kidney disease, stage 3a (HCC) Stable.  Recent baseline Cr 1.3-1.6.   Cr on admission 1.41 --Monitor BMP  Type II diabetes mellitus with renal manifestations (HCC) Recent A1c 6.4, well-controlled.  Patient is taking Jardiance at home -Sliding scale insulin  Depression with  anxiety --Continue home medications   Obesity (BMI 30-39.9) Body mass index is 35.41 kg/m. Complicates overall care and prognosis.  Recommend lifestyle modifications including physical activity and diet for weight loss and overall long-term health.        Subjective: Pt seated edge of bed this AM.  She reports feeling okay.  Asking about her cardiologist seeing her here today.  Denies any chest pain or SOB at rest.  Denies dizziness or lightheadedness but has not been up to attempt ambulation yet.  She requests cream or bio freeze for her low back pain and also asks for miralax.  She denies acute complaints.  Physical Exam: Vitals:   09/09/22 1016 09/09/22 1018 09/09/22 1020 09/09/22 1208  BP: (!) 144/58 (!) 141/64 115/67 (!) 122/53  Pulse: 91 91 95 80  Resp:    18  Temp:    98.7 F (37.1 C)  TempSrc:      SpO2: 96% 98% 98% 99%  Weight:      Height:       General exam: awake, alert, no acute distress, obese, chronically ill appearing HEENT: wearing glasses, moist mucus membranes, hearing grossly normal  Respiratory system: CTAB, no wheezes, rales or rhonchi, normal respiratory effort. Cardiovascular system: normal S1/S2, RRR, no JVD, murmurs, rubs, gallops, no pedal edema.   Gastrointestinal system: soft, NT, ND, no HSM felt, +bowel sounds. Central nervous system: A&O x3. no gross focal neurologic deficits, normal speech Extremities: moves all, no edema, normal tone Skin: dry, intact, normal temperature Psychiatry: normal mood, congruent affect   Data Reviewed:  Notable labs ---  Na 134, glucose 113, BUN 25, Cr improved 1.41 >> 1.33, Hbg stable 10.3  Family Communication: None  Disposition: Status is: Observation The patient remains OBS appropriate and will d/c before 2 midnights. Remains admitted for further observation and due to O2 desaturations with activity and no need for O2 at her baseline.   Planned Discharge Destination: Home with Home Health vs SNF (just  dc'd from SNF)    Time spent: 45 minutes  Author: Pennie Banter, DO 09/09/2022 2:04 PM  For on call review [email or web address].

## 2022-09-09 NOTE — Assessment & Plan Note (Addendum)
Stable.  Recent baseline Cr 1.3-1.6.   Cr on admission 1.41 >> 1.33 >> 1.62 today --Hold torsemide and spironolactone for today --Monitor BMP

## 2022-09-10 DIAGNOSIS — I5032 Chronic diastolic (congestive) heart failure: Secondary | ICD-10-CM | POA: Diagnosis not present

## 2022-09-10 DIAGNOSIS — R55 Syncope and collapse: Secondary | ICD-10-CM

## 2022-09-10 DIAGNOSIS — J9601 Acute respiratory failure with hypoxia: Secondary | ICD-10-CM | POA: Diagnosis not present

## 2022-09-10 DIAGNOSIS — N1831 Chronic kidney disease, stage 3a: Secondary | ICD-10-CM | POA: Diagnosis not present

## 2022-09-10 LAB — CULTURE, BLOOD (ROUTINE X 2): Special Requests: ADEQUATE

## 2022-09-10 LAB — BASIC METABOLIC PANEL
Anion gap: 8 (ref 5–15)
BUN: 30 mg/dL — ABNORMAL HIGH (ref 8–23)
CO2: 31 mmol/L (ref 22–32)
Calcium: 9.5 mg/dL (ref 8.9–10.3)
Chloride: 95 mmol/L — ABNORMAL LOW (ref 98–111)
Creatinine, Ser: 1.62 mg/dL — ABNORMAL HIGH (ref 0.44–1.00)
GFR, Estimated: 34 mL/min — ABNORMAL LOW (ref >60)
Glucose, Bld: 118 mg/dL — ABNORMAL HIGH (ref 70–99)
Potassium: 4.7 mmol/L (ref 3.5–5.1)
Sodium: 134 mmol/L — ABNORMAL LOW (ref 135–145)

## 2022-09-10 LAB — TROPONIN I (HIGH SENSITIVITY): Troponin I (High Sensitivity): 22 ng/L — ABNORMAL HIGH (ref <18)

## 2022-09-10 LAB — GLUCOSE, CAPILLARY
Glucose-Capillary: 125 mg/dL — ABNORMAL HIGH (ref 70–99)
Glucose-Capillary: 99 mg/dL (ref 70–99)
Glucose-Capillary: 135 mg/dL — ABNORMAL HIGH (ref 70–99)
Glucose-Capillary: 139 mg/dL — ABNORMAL HIGH (ref 70–99)
Glucose-Capillary: 146 mg/dL — ABNORMAL HIGH (ref 70–99)

## 2022-09-10 MED ORDER — POLYETHYLENE GLYCOL 3350 17 G PO PACK
17.0000 g | PACK | Freq: Two times a day (BID) | ORAL | Status: DC
  Administered 2022-09-10 – 2022-09-11 (×2): 17 g via ORAL
  Filled 2022-09-10 (×2): qty 1

## 2022-09-10 NOTE — Plan of Care (Signed)
  Problem: Education: Goal: Knowledge of condition and prescribed therapy will improve Outcome: Progressing   Problem: Cardiac: Goal: Will achieve and/or maintain adequate cardiac output Outcome: Progressing   Problem: Coping: Goal: Ability to adjust to condition or change in health will improve Outcome: Progressing   Problem: Fluid Volume: Goal: Ability to maintain a balanced intake and output will improve Outcome: Progressing

## 2022-09-10 NOTE — Progress Notes (Signed)
Progress Note   Patient: Jodi Chavez:[id number] DOB: Aug 15, 1950 DOA: 09/08/2022     0 DOS: the patient was seen and examined on 09/10/2022   Brief hospital course: HPI on admission 09/08/22 by Dr. Clyde Chavez: "Jodi Chavez is a 72 y.o. 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   Patient was recently hospitalized from 6/15 - 6/21 due to CHF exacerbation.  Patient was discharged to rehab facility, just released to home from rehab. Per report, pt was on her couch when she passed out at about 11:00 PM last night.  Patient has shortness of breath, no chest pain, cough.  Patient has nausea, no vomiting, diarrhea or abdominal pain.  Denies symptoms of UTI.  She states that she passed out for approximately 2 minutes.  She does not have unilateral numbness or tinglings in extremities.  No facial droop or slurred speech.  Patient moves all extremities normally.   EMS reports initial blood pressure was in 70s at the scene.  She received 1 L LR bolus in ED.  Blood pressure is 90/49 and  117/51 when I saw patient in ED.   Data reviewed independently and ED Course: pt was found to have trop 28 --> 28, BNP 927, Positive D-dimer 1.38, WBC 9.7, stable renal function, negative COVID PCR, temperature normal, heart rate 88, RR 19, blood pressure 117/51 currently, oxygen saturation 96% on 3 L oxygen (no documented oxygen desaturation).  X-ray negative.  CT head negative for acute intracranial abnormalities.  CTA negative for PE.  Lower extremity venous Doppler negative for DVT.  Patient is placed on telemetry bed for observation."  Further hospital course & management as outlined below.  7/14 -- O2 desaturations with PT, as low as 79% on room air, needing 2 L/min. Lightheadedness with activity.  Assessment and Plan: * Syncope Etiology is not clear, broad  differential. Most likely hypotension or orthostatic hypotension given EMS found pt's BP in 70's on their arrival.   CT head negative.   No focal neurodeficit on physical examination, low suspicions for stroke.  Pt has SOB and reportedly patient had hypotension, PE is a potential differential diagnosis, but CTA negative for PE.    7/14 - orthostatics show BP drops but HR does not increase (144/58 HR 91, 141/64, HR 91, 115/67 HR 95)  -- Daily orthostatic vital signs  -- Neuro checks  -- s/p IVF: 1L of NS -- PT/OT eval and treat -- Fall precautions  Acute respiratory failure with hypoxia (HCC) 7/14: O2 sats drop to 79% on room air with activity during PT (see PT note). Requiring 2 L/min O2 with activity. No baseline O2 requirement. Low lung volumes on CXR. PE ruled out by CTA chest. CTA did show some ground glass opacities - pulmonary edema vs multifocal infection, but pt afebrile, no leukocytosis, no respiratory symptoms. --Supplement O2 --Target O2 sat > 90% --Incentive spirometer  Myocardial injury Trop 28 --> 28, no CP.  Not consistent with ACS. --stat EKG & repeat troponin if chest pain  Essential hypertension Hypotension - EMS found BP in 70's  --IV hydralazine as needed --Continue metoprolol --Holding torsemide, spironolactone today with Cr incrase --Hold benazepril and resume when appropriate (initially held for soft BP's,now rising Cr)  Chronic diastolic CHF (congestive heart failure) (HCC) Echo on 08/12/2022 showed EF of 50-50%.  BNP 927, no lower extremity edema, clear chest x-ray.  Seems  euvolemic to dry on exam. --HOLD torsemide and spironolactone due to rising Cr today (7/15) --No need for IV diuretics, monitor closely  --Daily weights  HLD (hyperlipidemia) --Zocor and zetia   Chronic kidney disease, stage 3a (HCC) Stable.  Recent baseline Cr 1.3-1.6.   Cr on admission 1.41 >> 1.33 >> 1.62 today --Hold torsemide and spironolactone for today --Monitor  BMP  Type II diabetes mellitus with renal manifestations (HCC) Recent A1c 6.4, well-controlled.  Patient is taking Jardiance at home -Sliding scale insulin  Depression with anxiety --Continue home medications   Obesity (BMI 30-39.9) Body mass index is 35.41 kg/m. Complicates overall care and prognosis.  Recommend lifestyle modifications including physical activity and diet for weight loss and overall long-term health.        Subjective: Pt up in recliner when seen this AM.  She reports having no BM, requests miralax be made twice daily.  She complains of some lower/mid chest or epigastric discomfort this AM since getting up.  Denies dizziness, lightheadedness, nausea, SOB.    Physical Exam: Vitals:   09/10/22 0312 09/10/22 0404 09/10/22 0750 09/10/22 1228  BP: (!) 124/57  (!) 115/52 (!) 121/58  Pulse: 74  74 69  Resp: 20  20 18   Temp: 98.3 F (36.8 C)  98.6 F (37 C) 98.4 F (36.9 C)  TempSrc: Oral     SpO2: 95%  97% 95%  Weight:  85.6 kg    Height:       General exam: awake, alert, no acute distress, obese, chronically ill appearing HEENT: wearing glasses, moist mucus membranes, hearing grossly normal  Respiratory system: CTAB, no wheezes, rales or rhonchi, normal respiratory effort. Cardiovascular system: normal S1/S2, RRR, no edema.   Gastrointestinal system: soft, NT, ND Central nervous system: A&O x3. no gross focal neurologic deficits, normal speech Extremities: moves all, no edema, normal tone Skin: dry, intact, normal temperature Psychiatry: normal mood, congruent affect   Data Reviewed:  Notable labs ---  Na 134, Cl 95, glucose 118, BUN 25>>30, Cr improved 1.41 >> 1.33>>1.62  Family Communication: None  Disposition: Status is: Observation The patient remains OBS appropriate and will d/c before 2 midnights. Remains admitted for further observation and due to O2 desaturations with activity and no need for O2 at her baseline.  Needs SNF placement or set  up of HH if dc to home is safe.   Planned Discharge Destination: Home with Home Health vs SNF (just dc'd from SNF)    Time spent: 42 minutes  Author: Pennie Banter, DO 09/10/2022 2:01 PM  For on call review [email or web address].

## 2022-09-10 NOTE — TOC Initial Note (Signed)
Transition of Care [facility]) - Initial/Assessment Note    Patient Details  Name: Jodi Chavez MRN: [id number] Date of Birth: Jul 27, 1950  Transition of Care [facility]) CM/SW Contact:    Margarito Liner, LCSW Phone Number: 09/10/2022, 4:07 PM  Clinical Narrative:   CSW met with patient. No supports at bedside. CSW introduced role and explained that therapy recommendations would be discussed. Patient is currently observation status and has Medicare. She did have her qualifying inpatient stay for SNF during admission 6/15-6/21. She went to [name] 6/21-7/12 (21 days) and readmitted to the hospital on 7/13. Patient is agreeable to SNF again. CSW made her aware of copay days and she confirmed she cannot afford to pay up front. Will look for a facility that can work out a payment plan after rehab. CSW encouraged patient to work with therapy as much as possible to maybe be able to return home with home health at discharge. She said she is active with [facility]. No further concerns. CSW encouraged patient to contact CSW as needed. CSW will continue to follow patient for support and facilitate discharge once medically stable.               Expected Discharge Plan: Skilled Nursing Facility Barriers to Discharge: Continued Medical Work up   Patient Goals and CMS Choice            Expected Discharge Plan and Services     Post Acute Care Choice: Skilled Nursing Facility Living arrangements for the past 2 months: Single Family Home                                      Prior Living Arrangements/Services Living arrangements for the past 2 months: Single Family Home Lives with:: Relatives, Siblings Patient language and need for interpreter reviewed:: Yes Do you feel safe going back to the place where you live?: Yes      Need for Family Participation in Patient Care: Yes (Comment) Care giver support system in place?: Yes (comment)   Criminal Activity/Legal Involvement  Pertinent to Current Situation/Hospitalization: No - Comment as needed  Activities of Daily Living Home Assistive Devices/Equipment: Eyeglasses, Dan Humphreys (specify type) ADL Screening (condition at time of admission) Patient's cognitive ability adequate to safely complete daily activities?: Yes Is the patient deaf or have difficulty hearing?: No Does the patient have difficulty seeing, even when wearing glasses/contacts?: No Does the patient have difficulty concentrating, remembering, or making decisions?: No Patient able to express need for assistance with ADLs?: Yes Does the patient have difficulty dressing or bathing?: No Independently performs ADLs?: Yes (appropriate for developmental age) Does the patient have difficulty walking or climbing stairs?: No Weakness of Legs: Both Weakness of Arms/Hands: None  Permission Sought/Granted Permission sought to share information with : Facility Industrial/product designer granted to share information with : Yes, Verbal Permission Granted     Permission granted to share info w AGENCY: SNF's        Emotional Assessment Appearance:: Appears stated age Attitude/Demeanor/Rapport: Engaged, Gracious Affect (typically observed): Accepting, Appropriate, Calm, Pleasant Orientation: : Oriented to Self, Oriented to Place, Oriented to  Time, Oriented to Situation Alcohol / Substance Use: Not Applicable Psych Involvement: No (comment)  Admission diagnosis:  Syncope [R55] Weakness generalized [R53.1] Altered mental status, unspecified altered mental status type [R41.82] Syncope, unspecified syncope type [R55] Patient Active Problem List   Diagnosis Date Noted  Syncope 09/08/2022   Chronic diastolic CHF (congestive heart failure) (HCC) 09/08/2022   Depression with anxiety 09/08/2022   Acute on chronic diastolic CHF (congestive heart failure) (HCC) 08/11/2022   Myocardial injury 08/11/2022   Type II diabetes mellitus with renal manifestations  (HCC) 08/11/2022   Chronic kidney disease, stage 3a (HCC) 08/11/2022   Leukocytosis 08/11/2022   Hyponatremia 08/11/2022   Obesity (BMI 30-39.9) 08/11/2022   Pneumonia 05/27/2022   Near syncope 05/27/2022   Acute respiratory failure with hypoxia (HCC) 05/27/2022   Bilateral carotid artery disease (HCC) 08/08/2020   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   Posterior vitreous detachment of left eye 02/01/2020   Posterior subcapsular age-related cataract of right eye 02/01/2020   Posterior vitreous detachment of right eye 02/01/2020   Nuclear sclerotic cataract of both eyes 02/01/2020   Posterior subcapsular polar age-related cataract of left eye 02/01/2020   Aortic atherosclerosis (HCC) 12/04/2019   Breakthrough COVID-19 10/07/2019   Elevated troponin 10/07/2019   Left knee pain 09/22/2015   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 06/23/2015   HLD (hyperlipidemia) 11/21/2014   Depression 11/21/2014   Nausea & vomiting 11/21/2014   Sepsis (HCC) 11/21/2014   Coughing 11/21/2014   UTI (lower urinary tract infection) 11/21/2014   CKD (chronic kidney disease), stage III (HCC) 11/21/2014   Nausea with vomiting    Cervical spondylosis with radiculopathy 10/15/2011    Class: Chronic   HNP (herniated nucleus pulposus), cervical 10/15/2011    Class: Chronic   Diabetes mellitus without complication (HCC) 09/30/2006   Essential hypertension 09/30/2006   DILATION AND CURETTAGE, HX OF 09/30/2006   PCP:  Fleet Contras, MD Pharmacy:   [facility] [address], [place] - [address] [place] [address] Phone: [phone] Fax: [phone]  [facility] [address], [place] - [address] [place] [address] Phone: [phone] Fax: [phone]     Social Determinants of Health (SDOH) Social History: SDOH Screenings   Food Insecurity: No Food Insecurity (09/08/2022)  Housing: Low Risk   (09/08/2022)  Transportation Needs: No Transportation Needs (09/08/2022)  Utilities: Not At Risk (09/08/2022)  Tobacco Use: Low Risk  (09/08/2022)   SDOH Interventions:     Readmission Risk Interventions     No data to display

## 2022-09-10 NOTE — Progress Notes (Signed)
Physical Therapy Treatment Patient Details Name: Jodi Chavez MRN: [id number] DOB: 03-19-1950 Today's Date: 09/10/2022   History of Present Illness Jodi Chavez is a 72 y.o. 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 Patient was recently hospitalized from 6/15 - 6/21 due to CHF exacerbation.  Patient was discharged to rehab facility, just released to home from rehab. Per report, pt was on her couch when she passed out at about 11:00 PM at night. CT head negative.  No focal neurodeficit on physical examination, low suspicions for stroke.  Pt has SOB and reportedly patient had hypotension, PE is a potential differential diagnosis, but CTA negative for PE.    PT Comments  Pt speaking with NP at start of session. Resting vitals HR 75 O2 96 and not complaints of dizziness. STS from recliner supervision with RW. Stayed on 2L of O2 throughout ambulation. Ambulated 78ft before min guardreturning to room. Vitals while ambulating HR 99 Spo2 95. Patient resting in recliner after ambulating, transferred to bed for EKG with RW min g. Sit<>supine mod A for getting patients legs into bed. Patient left in bed with nursing present.     Assistance Recommended at Discharge Frequent or constant Supervision/Assistance  If plan is discharge home, recommend the following:  Can travel by private vehicle    A little help with walking and/or transfers;A little help with bathing/dressing/bathroom;Assistance with cooking/housework;Help with stairs or ramp for entrance   No  Equipment Recommendations  BSC/3in1    Recommendations for Other Services       Precautions / Restrictions Precautions Precautions: Fall Restrictions Weight Bearing Restrictions: No     Mobility  Bed Mobility Overal bed mobility: Needs Assistance Bed Mobility: Sit to  Supine       Sit to supine: Mod assist   General bed mobility comments: Pt requested assist for LEs to get into bed. Performed scooting to [facility] IND.    Transfers Overall transfer level: Needs assistance Equipment used: Rolling walker (2 wheels) Transfers: Sit to/from Stand Sit to Stand: Supervision                Ambulation/Gait Ambulation/Gait assistance: Min guard Gait Distance (Feet): 40 Feet Assistive device: Rolling walker (2 wheels) Gait Pattern/deviations: Decreased stride length, Trunk flexed       General Gait Details: min guard for safety due to dizziness and prior sessions.   Stairs             Wheelchair Mobility     Tilt Bed    Modified Rankin (Stroke Patients Only)       Balance Overall balance assessment: Needs assistance Sitting-balance support: Bilateral upper extremity supported Sitting balance-Leahy Scale: Good     Standing balance support: Bilateral upper extremity supported Standing balance-Leahy Scale: Fair Standing balance comment: dependent on B UE support on RW                            Cognition Arousal/Alertness: Awake/alert Behavior During Therapy: WFL for tasks assessed/performed, Anxious Overall Cognitive Status: Within Functional Limits for tasks assessed                                          Exercises  General Comments        Pertinent Vitals/Pain Pain Assessment Pain Assessment: Faces Faces Pain Scale: Hurts little more Pain Location: states back and knees hurt at times Pain Descriptors / Indicators: Discomfort Pain Intervention(s): Limited activity within patient's tolerance, Monitored during session    Home Living                          Prior Function            PT Goals (current goals can now be found in the care plan section) Acute Rehab PT Goals Patient Stated Goal: to find out what is wrong with her and why she passed out before going  home PT Goal Formulation: With patient Time For Goal Achievement: 09/23/22 Potential to Achieve Goals: Good Progress towards PT goals: Progressing toward goals    Frequency    Min 1X/week      PT Plan Current plan remains appropriate    Co-evaluation              AM-PAC PT "6 Clicks" Mobility   Outcome Measure  Help needed turning from your back to your side while in a flat bed without using bedrails?: A Little Help needed moving from lying on your back to sitting on the side of a flat bed without using bedrails?: A Little Help needed moving to and from a bed to a chair (including a wheelchair)?: A Little Help needed standing up from a chair using your arms (e.g., wheelchair or bedside chair)?: A Little Help needed to walk in hospital room?: A Little Help needed climbing 3-5 steps with a railing? : A Lot 6 Click Score: 17    End of Session Equipment Utilized During Treatment: Gait belt;Oxygen Activity Tolerance: Patient limited by fatigue;Patient tolerated treatment well Patient left: in bed;with call bell/phone within reach;with nursing/sitter in room Nurse Communication: Mobility status PT Visit Diagnosis: Difficulty in walking, not elsewhere classified (R26.2);Muscle weakness (generalized) (M62.81)     Time: 5284-1324 PT Time Calculation (min) (ACUTE ONLY): 14 min  Charges:                            Malachi Carl, SPT    Malachi Carl 09/10/2022, 1:09 PM

## 2022-09-10 NOTE — [place] FL2 (Addendum)
[place] MEDICAID FL2 LEVEL OF CARE FORM     IDENTIFICATION  Patient Name: Jodi Chavez Birthdate: April 13, 1950 Sex: female Admission Date (Current Location): 09/08/2022  [facility] and [place] Number:  Chiropodist and Address:  [facility], [address], [place], [place] [address]      Provider Number: [id number]  Attending Physician Name and Address:  Pennie Banter, DO  Relative Name and Phone Number:       Current Level of Care: Hospital Recommended Level of Care: Skilled Nursing Facility Prior Approval Number:    Date Approved/Denied:   PASRR Number: [id number]  Discharge Plan: SNF    Current Diagnoses: Patient Active Problem List   Diagnosis Date Noted   Syncope 09/08/2022   Chronic diastolic CHF (congestive heart failure) (HCC) 09/08/2022   Depression with anxiety 09/08/2022   Acute on chronic diastolic CHF (congestive heart failure) (HCC) 08/11/2022   Myocardial injury 08/11/2022   Type II diabetes mellitus with renal manifestations (HCC) 08/11/2022   Chronic kidney disease, stage 3a (HCC) 08/11/2022   Leukocytosis 08/11/2022   Hyponatremia 08/11/2022   Obesity (BMI 30-39.9) 08/11/2022   Pneumonia 05/27/2022   Near syncope 05/27/2022   Acute respiratory failure with hypoxia (HCC) 05/27/2022   Bilateral carotid artery disease (HCC) 08/08/2020   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   Posterior vitreous detachment of left eye 02/01/2020   Posterior subcapsular age-related cataract of right eye 02/01/2020   Posterior vitreous detachment of right eye 02/01/2020   Nuclear sclerotic cataract of both eyes 02/01/2020   Posterior subcapsular polar age-related cataract of left eye 02/01/2020   Aortic atherosclerosis (HCC) 12/04/2019   Breakthrough COVID-19 10/07/2019   Elevated troponin 10/07/2019   Left knee pain 09/22/2015   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 06/23/2015   HLD (hyperlipidemia)  11/21/2014   Depression 11/21/2014   Nausea & vomiting 11/21/2014   Sepsis (HCC) 11/21/2014   Coughing 11/21/2014   UTI (lower urinary tract infection) 11/21/2014   CKD (chronic kidney disease), stage III (HCC) 11/21/2014   Nausea with vomiting    Cervical spondylosis with radiculopathy 10/15/2011   HNP (herniated nucleus pulposus), cervical 10/15/2011   Diabetes mellitus without complication (HCC) 09/30/2006   Essential hypertension 09/30/2006   DILATION AND CURETTAGE, HX OF 09/30/2006    Orientation RESPIRATION BLADDER Height & Weight     Self, Time, Situation, Place  O2 (Nasal Cannula 2 L) Continent Weight: 188 lb 11.4 oz (85.6 kg) Height:  5\' 1"  (154.9 cm)  BEHAVIORAL SYMPTOMS/MOOD NEUROLOGICAL BOWEL NUTRITION STATUS   (None)  (None) Continent Diet (Heart healthy)  AMBULATORY STATUS COMMUNICATION OF NEEDS Skin   Limited Assist Verbally Normal                       Personal Care Assistance Level of Assistance  Bathing, Feeding, Dressing Bathing Assistance: Limited assistance Feeding assistance: Independent (Modified) Dressing Assistance: Limited assistance     Functional Limitations Info  Sight, Hearing, Speech Sight Info: Adequate Hearing Info: Adequate Speech Info: Adequate    SPECIAL CARE FACTORS FREQUENCY  PT (By licensed PT), OT (By licensed OT)     PT Frequency: 5 x week OT Frequency: 5 x week            Contractures Contractures Info: Not present    Additional Factors Info  Code Status, Allergies Code Status Info: Full code Allergies Info: Naproxen Sodium  Current Medications (09/10/2022):  This is the current hospital active medication list Current Facility-Administered Medications  Medication Dose Route Frequency Provider Last Rate Last Admin   acetaminophen (TYLENOL) tablet 650 mg  650 mg Oral Q6H PRN Mansy, Jan A, MD   650 mg at 09/10/22 1009   Or   acetaminophen (TYLENOL) suppository 650 mg  650 mg Rectal Q6H PRN Mansy, Jan  A, MD       albuterol (PROVENTIL) (2.5 MG/3ML) 0.083% nebulizer solution 2.5 mg  2.5 mg Nebulization Q4H PRN Lorretta Harp, MD       ALPRAZolam Prudy Feeler) tablet 0.25 mg  0.25 mg Oral QHS PRN Mansy, Jan A, MD       ascorbic acid (VITAMIN C) tablet 500 mg  500 mg Oral Daily Mansy, Jan A, MD   500 mg at 09/10/22 1610   aspirin EC tablet 81 mg  81 mg Oral Daily Lorretta Harp, MD   81 mg at 09/10/22 9604   calcium-vitamin D (OSCAL WITH D) 500-5 MG-MCG per tablet 1 tablet  1 tablet Oral Daily Mansy, Jan A, MD   1 tablet at 09/10/22 0851   dextromethorphan-guaiFENesin (MUCINEX DM) 30-600 MG per 12 hr tablet 1 tablet  1 tablet Oral BID PRN Lorretta Harp, MD   1 tablet at 09/10/22 0855   empagliflozin (JARDIANCE) tablet 10 mg  10 mg Oral Daily Mansy, Jan A, MD   10 mg at 09/10/22 0852   enoxaparin (LOVENOX) injection 40 mg  40 mg Subcutaneous Q24H Sharen Hones, RPH   40 mg at 09/10/22 1010   ezetimibe (ZETIA) tablet 10 mg  10 mg Oral QHS Mansy, Jan A, MD   10 mg at 09/09/22 2136   gabapentin (NEURONTIN) capsule 300 mg  300 mg Oral TID Mansy, Jan A, MD   300 mg at 09/10/22 5409   hydrALAZINE (APRESOLINE) injection 5 mg  5 mg Intravenous Q2H PRN Lorretta Harp, MD       insulin aspart (novoLOG) injection 0-5 Units  0-5 Units Subcutaneous QHS Lorretta Harp, MD       insulin aspart (novoLOG) injection 0-9 Units  0-9 Units Subcutaneous TID WC Lorretta Harp, MD   1 Units at 09/10/22 1355   loratadine (CLARITIN) tablet 10 mg  10 mg Oral Daily Lorretta Harp, MD   10 mg at 09/10/22 0851   magnesium hydroxide (MILK OF MAGNESIA) suspension 30 mL  30 mL Oral Daily PRN Mansy, Jan A, MD       magnesium oxide (MAG-OX) tablet 400 mg  400 mg Oral Daily Mansy, Jan A, MD   400 mg at 09/10/22 8119   metoprolol succinate (TOPROL-XL) 24 hr tablet 50 mg  50 mg Oral Daily Mansy, Jan A, MD   50 mg at 09/10/22 0851   multivitamin with minerals tablet 1 tablet  1 tablet Oral Daily Mansy, Jan A, MD   1 tablet at 09/10/22 0854   Muscle Rub CREA   Topical  PRN Esaw Grandchild A, DO   Given at 09/10/22 0856   ondansetron (ZOFRAN) tablet 4 mg  4 mg Oral Q6H PRN Mansy, Jan A, MD   4 mg at 09/10/22 0851   Or   ondansetron (ZOFRAN) injection 4 mg  4 mg Intravenous Q6H PRN Mansy, Jan A, MD   4 mg at 09/09/22 0247   Oral care mouth rinse  15 mL Mouth Rinse PRN Lorretta Harp, MD       pantoprazole (PROTONIX) EC tablet 40 mg  40  mg Oral Daily Mansy, Jan A, MD   40 mg at 09/10/22 0852   polyethylene glycol (MIRALAX / GLYCOLAX) packet 17 g  17 g Oral BID Esaw Grandchild A, DO       simvastatin (ZOCOR) tablet 40 mg  40 mg Oral QPM Mansy, Jan A, MD   40 mg at 09/09/22 1701   sodium chloride flush (NS) 0.9 % injection 3 mL  3 mL Intravenous Q12H Mansy, Jan A, MD   3 mL at 09/10/22 0854   traZODone (DESYREL) tablet 25 mg  25 mg Oral QHS PRN Mansy, Jan A, MD   25 mg at 09/09/22 2136   zinc sulfate capsule 220 mg  220 mg Oral Daily Mansy, Jan A, MD   220 mg at 09/10/22 1610     Discharge Medications: Please see discharge summary for a list of discharge medications.  Relevant Imaging Results:  Relevant Lab Results:   Additional Information SS#: [id number]   Had inpatient stay 6/15-6/21. Was at [name] 6/21-7/12 (21 days). Came back to the hospital on 7/13 and is observation.  Margarito Liner, LCSW

## 2022-09-10 NOTE — Care Management Obs Status (Signed)
MEDICARE OBSERVATION STATUS NOTIFICATION   Patient Details  Name: Jodi Chavez MRN: [id number] Date of Birth: 07/06/1950   Medicare Observation Status Notification Given:  Yes    Margarito Liner, LCSW 09/10/2022, 3:24 PM

## 2022-09-11 DIAGNOSIS — R55 Syncope and collapse: Secondary | ICD-10-CM | POA: Diagnosis not present

## 2022-09-11 DIAGNOSIS — I5032 Chronic diastolic (congestive) heart failure: Secondary | ICD-10-CM | POA: Diagnosis not present

## 2022-09-11 DIAGNOSIS — N1831 Chronic kidney disease, stage 3a: Secondary | ICD-10-CM | POA: Diagnosis not present

## 2022-09-11 DIAGNOSIS — I10 Essential (primary) hypertension: Secondary | ICD-10-CM | POA: Diagnosis not present

## 2022-09-11 LAB — BASIC METABOLIC PANEL
Anion gap: 8 (ref 5–15)
BUN: 33 mg/dL — ABNORMAL HIGH (ref 8–23)
CO2: 28 mmol/L (ref 22–32)
Calcium: 9.3 mg/dL (ref 8.9–10.3)
Chloride: 95 mmol/L — ABNORMAL LOW (ref 98–111)
Creatinine, Ser: 1.53 mg/dL — ABNORMAL HIGH (ref 0.44–1.00)
GFR, Estimated: 36 mL/min — ABNORMAL LOW (ref >60)
Glucose, Bld: 108 mg/dL — ABNORMAL HIGH (ref 70–99)
Potassium: 4.9 mmol/L (ref 3.5–5.1)
Sodium: 131 mmol/L — ABNORMAL LOW (ref 135–145)

## 2022-09-11 LAB — GLUCOSE, CAPILLARY
Glucose-Capillary: 111 mg/dL — ABNORMAL HIGH (ref 70–99)
Glucose-Capillary: 168 mg/dL — ABNORMAL HIGH (ref 70–99)

## 2022-09-11 MED ORDER — TORSEMIDE 20 MG PO TABS: 20.0000 mg | ORAL_TABLET | Freq: Every day | ORAL | Status: DC | PRN

## 2022-09-11 MED ORDER — LORATADINE 10 MG PO TABS: 10.0000 mg | ORAL_TABLET | Freq: Every day | ORAL | Status: AC

## 2022-09-11 MED ORDER — POLYETHYLENE GLYCOL 3350 17 G PO PACK: 17.0000 g | PACK | Freq: Two times a day (BID) | ORAL | 0 refills | Status: DC

## 2022-09-11 MED ORDER — ONDANSETRON HCL 4 MG PO TABS: 4.0000 mg | ORAL_TABLET | Freq: Three times a day (TID) | ORAL | 0 refills | Status: AC | PRN

## 2022-09-11 NOTE — TOC Progression Note (Addendum)
Transition of Care [facility]) - Progression Note    Patient Details  Name: Jodi Chavez MRN: [id number] Date of Birth: October 14, 1950  Transition of Care [facility]) CM/SW Contact  Truddie Hidden, RN Phone Number: 09/11/2022, 11:01 AM  Clinical Narrative:    No bed offers.    4:14pm Spoke with patient regarding discharge plan. She was advised no bed offers have been made and per MD she is medically stable to discharge. She is supported by her nephew and  is active with [facility]. Patient family will transport her home.   Cheryl from [facility] notified of discharge.   TOC signing off.    Expected Discharge Plan: Skilled Nursing Facility Barriers to Discharge: Continued Medical Work up  Expected Discharge Plan and Services     Post Acute Care Choice: Skilled Nursing Facility Living arrangements for the past 2 months: Single Family Home                                       Social Determinants of Health (SDOH) Interventions SDOH Screenings   Food Insecurity: No Food Insecurity (09/08/2022)  Housing: Low Risk  (09/08/2022)  Transportation Needs: No Transportation Needs (09/08/2022)  Utilities: Not At Risk (09/08/2022)  Tobacco Use: Low Risk  (09/08/2022)    Readmission Risk Interventions     No data to display

## 2022-09-11 NOTE — Progress Notes (Signed)
Physical Therapy Treatment Patient Details Name: NECHELLE PETRIZZO MRN: [id number] DOB: [birth year] Today's Date: 09/11/2022   History of Present Illness Falyn P Aten is a 72 y.o. 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 Patient was recently hospitalized from 6/15 - 6/21 due to CHF exacerbation.  Patient was discharged to rehab facility, just released to home from rehab. Per report, pt was on her couch when she passed out at about 11:00 PM at night. CT head negative.  No focal neurodeficit on physical examination, low suspicions for stroke.  Pt has SOB and reportedly patient had hypotension, PE is a potential differential diagnosis, but CTA negative for PE.   PT Comments  Pt received up in chair requesting to use bathroom. Pt demonstrated Supervision for transfers from various surfaces with definite use of Ue's. Gait training with RW 20' x 2, 65' x 1 with CGA for safety. Pt remained on RA throughout session with SpO2 between 95-97% upon exertion. No SOB, no c/o dizziness. Pt does not feel she is at her functional baseline at this time. Will continue to progress acutely.   Assistance Recommended at Discharge Frequent or constant Supervision/Assistance  If plan is discharge home, recommend the following:  Can travel by private vehicle    A little help with walking and/or transfers;A little help with bathing/dressing/bathroom;Assistance with cooking/housework;Help with stairs or ramp for entrance  No  Equipment Recommendations  Other (comment) (TBD at next facility)    Recommendations for Other Services     Precautions / Restrictions Precautions Precautions: Fall Restrictions Weight Bearing Restrictions: No     Mobility  Bed Mobility Overal bed mobility: Needs Assistance Bed Mobility: Sit to Supine     Supine to sit: Min  assist Sit to supine: Mod assist   General bed mobility comments: Pt requested assist for LEs to get into bed. Performed scooting to [facility] IND.    Transfers Overall transfer level: Needs assistance Equipment used: Rolling walker (2 wheels) Transfers: Sit to/from Stand Sit to Stand: Supervision           General transfer comment:  (Sit<>stand from [facility] with Supervision)   Ambulation/Gait Ambulation/Gait assistance: Min guard Gait Distance (Feet):  (65) Assistive device: Rolling walker (2 wheels) Gait Pattern/deviations: Decreased stride length, Trunk flexed Gait velocity:  (decreased)     General Gait Details:  (No LOB, 97% SpO2 on RA)   Stairs            Wheelchair Mobility    Tilt Bed   Modified Rankin (Stroke Patients Only)     Balance Overall balance assessment: Needs assistance Sitting-balance support: Bilateral upper extremity supported Sitting balance-Leahy Scale: Good     Standing balance support: Bilateral upper extremity supported, During functional activity, Reliant on assistive device for balance Standing balance-Leahy Scale: Fair Standing balance comment:  (Heavy reliance on RW for support)                           Cognition Arousal/Alertness: Awake/alert Behavior During Therapy: WFL for tasks assessed/performed, Anxious Overall Cognitive Status: Within Functional Limits for tasks assessed                                 General Comments:  (pleasant and cooperative)  Exercises General Exercises - Lower Extremity Ankle Circles/Pumps: AROM, Both, 10 reps, Seated Long Arc Quad: AROM, Both, 10 reps, Seated   General Comments General comments (skin integrity, edema, etc.): Education provided on role of PT and set acute goals. Discussed at length pt's concerns about returning home with limited assist and requesting STR      Pertinent Vitals/Pain Pain Assessment Pain Assessment: No/denies pain    Home Living                           Prior Function            PT Goals (current goals can now be found in the care plan section) Acute Rehab PT Goals Patient Stated Goal: to find out what is wrong with her and why she passed out before going home Progress towards PT goals: Progressing toward goals    Frequency   Min 1X/week      PT Plan Current plan remains appropriate   Co-evaluation              AM-PAC PT "6 Clicks" Mobility   Outcome Measure  Help needed turning from your back to your side while in a flat bed without using bedrails?: A Little Help needed moving from lying on your back to sitting on the side of a flat bed without using bedrails?: A Little Help needed moving to and from a bed to a chair (including a wheelchair)?: A Little Help needed standing up from a chair using your arms (e.g., wheelchair or bedside chair)?: A Little Help needed to walk in hospital room?: A Little Help needed climbing 3-5 steps with a railing? : A Lot 6 Click Score: 17    End of Session Equipment Utilized During Treatment: Gait belt Activity Tolerance: Patient limited by fatigue;Patient tolerated treatment well Patient left: in chair;with call bell/phone within reach;with chair alarm set Nurse Communication: Mobility status PT Visit Diagnosis: Difficulty in walking, not elsewhere classified (R26.2);Muscle weakness (generalized) (M62.81)     Time: 1308-6578 PT Time Calculation (min) (ACUTE ONLY): 28 min  Charges:    $Gait Training: 8-22 mins $Therapeutic Exercise: 8-22 mins PT General Charges $$ ACUTE PT VISIT: 1 Visit                    Zadie Cleverly, PTA  Jannet Askew 09/11/2022, 1:28 PM

## 2022-09-11 NOTE — Plan of Care (Signed)
  Problem: Education: Goal: Knowledge of condition and prescribed therapy will improve Outcome: Progressing   Problem: Physical Regulation: Goal: Complications related to the disease process, condition or treatment will be avoided or minimized Outcome: Progressing   Problem: Coping: Goal: Ability to adjust to condition or change in health will improve Outcome: Progressing   Problem: Fluid Volume: Goal: Ability to maintain a balanced intake and output will improve Outcome: Progressing

## 2022-09-11 NOTE — Discharge Summary (Signed)
None Physician Discharge Summary   Patient: Jodi Chavez MRN: [id number] DOB: 1950/12/15  Admit date:     09/08/2022  Discharge date: 09/10/2022  Discharge Physician: Pennie Banter   PCP: Fleet Contras, MD   Recommendations at discharge:   Follow up with [facility] within 1 -2 weeks Follow up with Primary Care in 1-2 weeks Repeat CBC, BMP, Mg at follow up Follow up on patient's blood pressure.  Admitted after syncope, BP found to be in 70's.  BP's soft but normal with meds held during admission.  See below. Monitor BP's at home and bring to follow up appointments Follow up with Home Health for PT and RN support at home  Discharge Diagnoses: Principal Problem:   Syncope Active Problems:   Myocardial injury   Essential hypertension   HLD (hyperlipidemia)   Chronic diastolic CHF (congestive heart failure) (HCC)   Type II diabetes mellitus with renal manifestations (HCC)   Chronic kidney disease, stage 3a (HCC)   Depression with anxiety   Obesity (BMI 30-39.9)  Resolved Problems:   Acute respiratory failure with hypoxia [facility])  Hospital Course: HPI on admission 09/08/22 by Dr. Clyde Lundborg: "Jodi Chavez is a 72 y.o. 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   Patient was recently hospitalized from 6/15 - 6/21 due to CHF exacerbation.  Patient was discharged to rehab facility, just released to home from rehab. Per report, pt was on her couch when she passed out at about 11:00 PM last night.  Patient has shortness of breath, no chest pain, cough.  Patient has nausea, no vomiting, diarrhea or abdominal pain.  Denies symptoms of UTI.  She states that she passed out for approximately 2 minutes.  She does not have unilateral numbness or tinglings in extremities.  No facial droop or slurred speech.  Patient moves all extremities normally.    EMS reports initial blood pressure was in 70s at the scene.  She received 1 L LR bolus in ED.  Blood pressure is 90/49 and  117/51 when I saw patient in ED.   Data reviewed independently and ED Course: pt was found to have trop 28 --> 28, BNP 927, Positive D-dimer 1.38, WBC 9.7, stable renal function, negative COVID PCR, temperature normal, heart rate 88, RR 19, blood pressure 117/51 currently, oxygen saturation 96% on 3 L oxygen (no documented oxygen desaturation).  X-ray negative.  CT head negative for acute intracranial abnormalities.  CTA negative for PE.  Lower extremity venous Doppler negative for DVT.  Patient is placed on telemetry bed for observation."  Further hospital course & management as outlined below.  7/14 -- O2 desaturations with PT, as low as 79% on room air, needing 2 L/min. Lightheadedness with activity.   7/16 --- orthostatics improved.  Pt doing well overall.  Discussed no SNF offers with ability for payment plan, as she is in copay days now.  She is medically stable for discharge home with home health. Family were updated by phone and all agreeable.   Assessment and Plan: * Syncope Etiology is not clear, broad differential. Most likely hypotension or orthostatic hypotension given EMS found pt's BP in 70's on their arrival.   CT head negative.   No focal neurodeficit on physical examination, low suspicions for stroke.  Pt has SOB and reportedly patient had hypotension, PE is a potential differential diagnosis, but CTA negative for  PE.    7/14 - orthostatics show BP drops but HR does not increase (144/58 HR 91, 141/64, HR 91, 115/67 HR 95)  -- Daily orthostatic vital signs  -- Neuro checks  -- s/p IVF: 1L of NS -- PT/OT eval and treat -- Fall precautions  Will d/c on Torsemide AS NEEDED -- pt was hypotensive and syncopal. --Close Cardiology follow up recommended  Acute respiratory failure with hypoxia (HCC)-resolved as of 09/19/2022 7/14: O2 sats drop to 79% on  room air with activity during PT (see PT note). Requiring 2 L/min O2 with activity. No baseline O2 requirement. Low lung volumes on CXR. PE ruled out by CTA chest. CTA did show some ground glass opacities - pulmonary edema vs multifocal infection, but pt afebrile, no leukocytosis, no respiratory symptoms. --Supplement O2 --Target O2 sat > 90% --Incentive spirometer  Myocardial injury Trop 28 --> 28, no CP.  Not consistent with ACS. --stat EKG & repeat troponin if chest pain  Essential hypertension Hypotension - EMS found BP in 70's  --IV hydralazine as needed --Continue metoprolol --Holding torsemide, spironolactone today with Cr incrase --Hold benazepril and resume when appropriate (initially held for soft BP's,now rising Cr)  Chronic diastolic CHF (congestive heart failure) (HCC) Echo on 08/12/2022 showed EF of 50-50%.  BNP 927, no lower extremity edema, clear chest x-ray.  Seems euvolemic to dry on exam. --HOLD torsemide and spironolactone due to rising Cr today (7/15) --No need for IV diuretics, monitor closely  --Daily weights --Will need close cardiology follow up  HLD (hyperlipidemia) --Zocor and zetia   Chronic kidney disease, stage 3a (HCC) Stable.  Recent baseline Cr 1.3-1.6.   Cr on admission 1.41 >> 1.33 >> 1.62 today --Hold torsemide and spironolactone for today --Monitor BMP  Type II diabetes mellitus with renal manifestations (HCC) Recent A1c 6.4, well-controlled.  Patient is taking Jardiance at home -Sliding scale insulin  Depression with anxiety --Continue home medications   Obesity (BMI 30-39.9) Body mass index is 35.41 kg/m. Complicates overall care and prognosis.  Recommend lifestyle modifications including physical activity and diet for weight loss and overall long-term health.         Consultants: none Procedures performed: none  Disposition: Home health Diet recommendation:  Discharge Diet Orders (From admission, onward)     Start      Ordered   09/11/22 0000  Diet - low sodium heart healthy        09/11/22 1412           Cardiac diet DISCHARGE MEDICATION: Allergies as of 09/11/2022       Reactions   Naproxen Sodium Rash   anaprox        Medication List     STOP taking these medications    benazepril 40 MG tablet Commonly known as: LOTENSIN   furosemide 20 MG tablet Commonly known as: LASIX   promethazine 25 MG tablet Commonly known as: PHENERGAN   spironolactone 25 MG tablet Commonly known as: ALDACTONE       TAKE these medications    ALPRAZolam 0.25 MG tablet Commonly known as: XANAX Take 0.25 mg by mouth at bedtime as needed.   ascorbic acid 500 MG tablet Commonly known as: VITAMIN C Take 500 mg by mouth daily.   aspirin EC 81 MG tablet Take 81 mg by mouth daily.   calcium-vitamin D 500-200 MG-UNIT Tabs tablet Commonly known as: OSCAL WITH D Take 1 tablet by mouth daily.   empagliflozin 10 MG Tabs tablet Commonly known  as: JARDIANCE Take 1 tablet (10 mg total) by mouth daily.   ezetimibe 10 MG tablet Commonly known as: ZETIA Take 10 mg by mouth at bedtime.   gabapentin 300 MG capsule Commonly known as: NEURONTIN Take 300 mg by mouth 3 (three) times daily.   Garlic 1000 MG Caps Take 5,000 mg by mouth daily after breakfast.   Glucosamine-Chondroitin 500-400 MG Caps Take 1 tablet by mouth daily.   loratadine 10 MG tablet Commonly known as: CLARITIN Take 1 tablet (10 mg total) by mouth daily.   magnesium oxide 400 MG tablet Commonly known as: MAG-OX Take 400 mg by mouth daily.   metFORMIN 850 MG tablet Commonly known as: GLUCOPHAGE Take 850 mg by mouth daily with breakfast.   metoprolol succinate 50 MG 24 hr tablet Commonly known as: TOPROL-XL Take 50 mg by mouth daily.   multivitamin with minerals Tabs tablet Take 1 tablet by mouth daily after breakfast.   ondansetron 4 MG tablet Commonly known as: Zofran Take 1 tablet (4 mg total) by mouth every 8  (eight) hours as needed for nausea or vomiting.   pantoprazole 40 MG tablet Commonly known as: PROTONIX Take 1 tablet (40 mg total) by mouth daily.   polyethylene glycol 17 g packet Commonly known as: MIRALAX / GLYCOLAX Take 17 g by mouth 2 (two) times daily.   simvastatin 40 MG tablet Commonly known as: ZOCOR Take 40 mg by mouth every evening.   torsemide 20 MG tablet Commonly known as: DEMADEX Take 1 tablet (20 mg total) by mouth daily as needed (for increased swelling or weight gain). What changed:  when to take this reasons to take this   zinc sulfate 220 (50 Zn) MG capsule Take 220 mg by mouth daily.        Follow-up Information     Alwyn Pea, MD. Go to.   Specialties: Cardiology, Internal Medicine Why: Appointment on Tuesday, 10/02/2022 at 12:00pm. Contact information: [address] [place] [address] [phone]         Fleet Contras, MD. Schedule an appointment as soon as possible for a visit.   Specialty: Internal Medicine Why: Hospital follow up withing 1-2 weeks Contact information: [address] [place] [address] [phone]                Discharge Exam: Filed Weights   09/08/22 2302 09/10/22 0404 09/11/22 0526  Weight: 85 kg 85.6 kg 83.9 kg   General exam: awake, alert, no acute distress HEENT: atraumatic, clear conjunctiva, anicteric sclera, moist mucus membranes, hearing grossly normal  Respiratory system: CTAB, no wheezes, rales or rhonchi, normal respiratory effort. Cardiovascular system: normal S1/S2, RRR, no pedal edema.   Gastrointestinal system: soft, NT, ND, no HSM felt, +bowel sounds. Central nervous system: A&O x 3. no gross focal neurologic deficits, normal speech Extremities: moves all, no edema, normal tone Skin: dry, intact, normal temperature Psychiatry: normal mood, congruent affect, judgement and insight appear normal   Condition at discharge: stable  The results of significant  diagnostics from this hospitalization (including imaging, microbiology, ancillary and laboratory) are listed below for reference.   Imaging Studies: US Venous Img Lower Bilateral (DVT)  Result Date: 09/08/2022 CLINICAL DATA:  Elevated D-dimer.  Evaluate for DVT. EXAM: BILATERAL LOWER EXTREMITY VENOUS DOPPLER ULTRASOUND TECHNIQUE: Gray-scale sonography with graded compression, as well as color Doppler and duplex ultrasound were performed to evaluate the lower extremity deep venous systems from the level of the common femoral vein and including the  common femoral, femoral, profunda femoral, popliteal and calf veins including the posterior tibial, peroneal and gastrocnemius veins when visible. The superficial great saphenous vein was also interrogated. Spectral Doppler was utilized to evaluate flow at rest and with distal augmentation maneuvers in the common femoral, femoral and popliteal veins. COMPARISON:  Chest CTA-earlier same day (negative for pulmonary embolism). FINDINGS: RIGHT LOWER EXTREMITY Common Femoral Vein: No evidence of thrombus. Normal compressibility, respiratory phasicity and response to augmentation. Saphenofemoral Junction: No evidence of thrombus. Normal compressibility and flow on color Doppler imaging. Profunda Femoral Vein: No evidence of thrombus. Normal compressibility and flow on color Doppler imaging. Femoral Vein: No evidence of thrombus. Normal compressibility, respiratory phasicity and response to augmentation. Popliteal Vein: No evidence of thrombus. Normal compressibility, respiratory phasicity and response to augmentation. Calf Veins: No evidence of thrombus. Normal compressibility and flow on color Doppler imaging. Superficial Great Saphenous Vein: No evidence of thrombus. Normal compressibility. Other Findings:  None. LEFT LOWER EXTREMITY Common Femoral Vein: No evidence of thrombus. Normal compressibility, respiratory phasicity and response to augmentation. Saphenofemoral  Junction: No evidence of thrombus. Normal compressibility and flow on color Doppler imaging. Profunda Femoral Vein: No evidence of thrombus. Normal compressibility and flow on color Doppler imaging. Femoral Vein: No evidence of thrombus. Normal compressibility, respiratory phasicity and response to augmentation. Popliteal Vein: No evidence of thrombus. Normal compressibility, respiratory phasicity and response to augmentation. Calf Veins: No evidence of thrombus. Normal compressibility and flow on color Doppler imaging. Superficial Great Saphenous Vein: No evidence of thrombus. Normal compressibility. Other Findings: There is a moderate amount of subcutaneous edema at the level of the left lower leg and calf. IMPRESSION: No evidence of DVT within either lower extremity. Electronically Signed   By: Simonne Come M.D.   On: 09/08/2022 12:12   CT Angio Chest Pulmonary Embolism (PE) W or WO Contrast  Result Date: 09/08/2022 CLINICAL DATA:  Syncope, high clinical suspicion for PE EXAM: CT ANGIOGRAPHY CHEST WITH CONTRAST TECHNIQUE: Multidetector CT imaging of the chest was performed using the standard protocol during bolus administration of intravenous contrast. Multiplanar CT image reconstructions and MIPs were obtained to evaluate the vascular anatomy. RADIATION DOSE REDUCTION: This exam was performed according to the departmental dose-optimization program which includes automated exposure control, adjustment of the mA and/or kV according to patient size and/or use of iterative reconstruction technique. CONTRAST:  75mL OMNIPAQUE IOHEXOL 350 MG/ML SOLN COMPARISON:  CT done on 10/07/2019, chest radiograph done today FINDINGS: Cardiovascular: There are no intraluminal filling defects seen pulmonary artery branches. There is homogeneous enhancement in thoracic aorta. Calcifications are seen thoracic aorta and its major branches. Coronary artery calcifications are seen. Minimal pericardial effusion is seen.  Mediastinum/Nodes: There are slightly enlarged lymph nodes in mediastinum and hilar regions. Lungs/Pleura: Extrinsic patchy ground-glass densities are seen in both lungs. There is no focal consolidation. There is no pleural effusion or pneumothorax. Upper Abdomen: There are calcified gallbladder stones. There are no signs of acute cholecystitis. Musculoskeletal: Degenerative changes are noted in both shoulders. Degenerative changes are noted in thoracic spine with posterior bony spurs causing extrinsic pressure over the ventral margin of thecal sac at multiple levels. Review of the MIP images confirms the above findings. IMPRESSION: There is no evidence of pulmonary embolism. There is no evidence of thoracic aortic dissection. Aortic atherosclerosis. Coronary artery calcifications are seen. Extensive patchy ground-glass densities are seen in both lungs suggesting pulmonary edema or multifocal pneumonia. There is no focal pulmonary consolidation. There is no pleural effusion or pneumothorax.  Gallbladder stones. Degenerative changes are noted in thoracic spine and both shoulders. Electronically Signed   By: Ernie Avena M.D.   On: 09/08/2022 11:14   DG Chest Port 1 View  Result Date: 09/08/2022 CLINICAL DATA:  Altered mental status EXAM: PORTABLE CHEST 1 VIEW COMPARISON:  Radiographs 08/14/2022 FINDINGS: The lung volumes accentuate cardiomediastinal silhouette and pulmonary vascularity. Aortic atherosclerotic calcification. No focal consolidation, pleural effusion, or pneumothorax. No displaced rib fractures. IMPRESSION: Low lung volumes.  No active disease. Electronically Signed   By: Minerva Fester M.D.   On: 09/08/2022 01:06   CT Head Wo Contrast  Result Date: 09/08/2022 CLINICAL DATA:  Mental status change EXAM: CT HEAD WITHOUT CONTRAST TECHNIQUE: Contiguous axial images were obtained from the base of the skull through the vertex without intravenous contrast. RADIATION DOSE REDUCTION: This exam was  performed according to the departmental dose-optimization program which includes automated exposure control, adjustment of the mA and/or kV according to patient size and/or use of iterative reconstruction technique. COMPARISON:  CT head 05/27/2022 FINDINGS: Brain: No intracranial hemorrhage, mass effect, or evidence of acute infarct. No hydrocephalus. No extra-axial fluid collection. Vascular: No hyperdense vessel or unexpected calcification. Skull: No fracture or focal lesion. Sinuses/Orbits: No acute finding. Paranasal sinuses and mastoid air cells are well aerated. Other: None. IMPRESSION: No acute intracranial abnormality. Electronically Signed   By: Minerva Fester M.D.   On: 09/08/2022 00:49    Microbiology: Results for orders placed or performed during the hospital encounter of 09/08/22  Culture, blood (routine x 2)     Status: None   Collection Time: 09/08/22 12:35 AM   Specimen: BLOOD LEFT HAND  Result Value Ref Range Status   Specimen Description BLOOD LEFT HAND  Final   Special Requests   Final    BOTTLES DRAWN AEROBIC AND ANAEROBIC Blood Culture results may not be optimal due to an excessive volume of blood received in culture bottles   Culture   Final    NO GROWTH 5 DAYS Performed at [facility], [address] [place], [place] [address]    Report Status 09/13/2022 FINAL  Final  Culture, blood (routine x 2)     Status: Abnormal   Collection Time: 09/08/22 12:35 AM   Specimen: BLOOD RIGHT HAND  Result Value Ref Range Status   Specimen Description   Final    BLOOD RIGHT HAND Performed at [facility], [address] [place], [place] [address]    Special Requests   Final    BOTTLES DRAWN AEROBIC AND ANAEROBIC Blood Culture adequate volume Performed at [facility], [address] [place], [place] [address]    Culture  Setup Time   Final    GRAM POSITIVE COCCI AEROBIC BOTTLE ONLY CRITICAL RESULT CALLED TO, READ BACK BY AND VERIFIED WITH:  CARISSA [facility] AT 9147 09/09/22.PMF    Culture (A)  Final    STAPHYLOCOCCUS EPIDERMIDIS THE SIGNIFICANCE OF ISOLATING THIS ORGANISM FROM A SINGLE SET OF BLOOD CULTURES WHEN MULTIPLE SETS ARE DRAWN IS UNCERTAIN. PLEASE NOTIFY THE MICROBIOLOGY DEPARTMENT WITHIN ONE WEEK IF SPECIATION AND SENSITIVITIES ARE REQUIRED. Performed at [facility], [address] [place], [place] [address]    Report Status 09/10/2022 FINAL  Final  Blood Culture ID Panel (Reflexed)     Status: Abnormal   Collection Time: 09/08/22 12:35 AM  Result Value Ref Range Status   Enterococcus faecalis NOT DETECTED NOT DETECTED Final   Enterococcus Faecium NOT DETECTED NOT DETECTED Final  Listeria monocytogenes NOT DETECTED NOT DETECTED Final   Staphylococcus species DETECTED (A) NOT DETECTED Final    Comment: CRITICAL RESULT CALLED TO, READ BACK BY AND VERIFIED WITH: CARISSA [facility] AT 1610 09/09/22.PMF    Staphylococcus aureus (BCID) NOT DETECTED NOT DETECTED Final   Staphylococcus epidermidis DETECTED (A) NOT DETECTED Final    Comment: Methicillin (oxacillin) resistant coagulase negative staphylococcus. Possible blood culture contaminant (unless isolated from more than one blood culture draw or clinical case suggests pathogenicity). No antibiotic treatment is indicated for blood  culture contaminants. CRITICAL RESULT CALLED TO, READ BACK BY AND VERIFIED WITH: CARISSA [facility] AT 9604 09/09/22.PMF    Staphylococcus lugdunensis NOT DETECTED NOT DETECTED Final   Streptococcus species NOT DETECTED NOT DETECTED Final   Streptococcus agalactiae NOT DETECTED NOT DETECTED Final   Streptococcus pneumoniae NOT DETECTED NOT DETECTED Final   Streptococcus pyogenes NOT DETECTED NOT DETECTED Final   A.calcoaceticus-baumannii NOT DETECTED NOT DETECTED Final   Bacteroides fragilis NOT DETECTED NOT DETECTED Final   Enterobacterales NOT DETECTED NOT DETECTED Final   Enterobacter cloacae complex NOT DETECTED NOT DETECTED Final    Escherichia coli NOT DETECTED NOT DETECTED Final   Klebsiella aerogenes NOT DETECTED NOT DETECTED Final   Klebsiella oxytoca NOT DETECTED NOT DETECTED Final   Klebsiella pneumoniae NOT DETECTED NOT DETECTED Final   Proteus species NOT DETECTED NOT DETECTED Final   Salmonella species NOT DETECTED NOT DETECTED Final   Serratia marcescens NOT DETECTED NOT DETECTED Final   Haemophilus influenzae NOT DETECTED NOT DETECTED Final   Neisseria meningitidis NOT DETECTED NOT DETECTED Final   Pseudomonas aeruginosa NOT DETECTED NOT DETECTED Final   Stenotrophomonas maltophilia NOT DETECTED NOT DETECTED Final   Candida albicans NOT DETECTED NOT DETECTED Final   Candida auris NOT DETECTED NOT DETECTED Final   Candida glabrata NOT DETECTED NOT DETECTED Final   Candida krusei NOT DETECTED NOT DETECTED Final   Candida parapsilosis NOT DETECTED NOT DETECTED Final   Candida tropicalis NOT DETECTED NOT DETECTED Final   Cryptococcus neoformans/gattii NOT DETECTED NOT DETECTED Final   Methicillin resistance mecA/C DETECTED (A) NOT DETECTED Final    Comment: CRITICAL RESULT CALLED TO, READ BACK BY AND VERIFIED WITH: CARISSA [facility] AT 5409 09/09/22.PMF Performed at [facility], [address] [place], [place] [address]   SARS Coronavirus 2 by RT PCR (hospital order, performed in [facility] hospital lab) *cepheid single result test* Anterior Nasal Swab     Status: None   Collection Time: 09/08/22  1:02 AM   Specimen: Anterior Nasal Swab  Result Value Ref Range Status   SARS Coronavirus 2 by RT PCR NEGATIVE NEGATIVE Final    Comment: (NOTE) SARS-CoV-2 target nucleic acids are NOT DETECTED.  The SARS-CoV-2 RNA is generally detectable in upper and lower respiratory specimens during the acute phase of infection. The lowest concentration of SARS-CoV-2 viral copies this assay can detect is 250 copies / mL. A negative result does not preclude SARS-CoV-2 infection and should not be used as the sole  basis for treatment or other patient management decisions.  A negative result may occur with improper specimen collection / handling, submission of specimen other than nasopharyngeal swab, presence of viral mutation(s) within the areas targeted by this assay, and inadequate number of viral copies (<250 copies / mL). A negative result must be combined with clinical observations, patient history, and epidemiological information.  Fact Sheet for Patients:   RoadLapTop.co.za  Fact Sheet for Healthcare Providers: http://kim-miller.com/  This test is not yet  approved or  cleared by the [place] and has been authorized for detection and/or diagnosis of SARS-CoV-2 by FDA under an Emergency Use Authorization (EUA).  This EUA will remain in effect (meaning this test can be used) for the duration of the COVID-19 declaration under Section 564(b)(1) of the Act, 21 U.S.C. section 360bbb-3(b)(1), unless the authorization is terminated or revoked sooner.  Performed at [facility], [address] [place], [place] [address]     Labs: CBC: No results for input(s): "WBC", "NEUTROABS", "HGB", "HCT", "MCV", "PLT" in the last 168 hours.  Basic Metabolic Panel: No results for input(s): "NA", "K", "CL", "CO2", "GLUCOSE", "BUN", "CREATININE", "CALCIUM", "MG", "PHOS" in the last 168 hours.  Liver Function Tests: No results for input(s): "AST", "ALT", "ALKPHOS", "BILITOT", "PROT", "ALBUMIN" in the last 168 hours.  CBG: No results for input(s): "GLUCAP" in the last 168 hours.   Discharge time spent: greater than 30 minutes.  Signed: Pennie Banter, DO [facility] 09/19/2022

## 2022-09-13 LAB — CULTURE, BLOOD (ROUTINE X 2): Culture: NO GROWTH

## 2022-09-19 ENCOUNTER — Encounter: Payer: Self-pay | Admitting: Family Medicine

## 2022-09-24 ENCOUNTER — Other Ambulatory Visit: Payer: Self-pay | Admitting: Internal Medicine

## 2022-10-15 ENCOUNTER — Ambulatory Visit (INDEPENDENT_AMBULATORY_CARE_PROVIDER_SITE_OTHER): Payer: Medicare Other | Admitting: Podiatry

## 2022-10-15 ENCOUNTER — Encounter: Payer: Self-pay | Admitting: Podiatry

## 2022-10-15 DIAGNOSIS — M129 Arthropathy, unspecified: Secondary | ICD-10-CM | POA: Diagnosis not present

## 2022-10-15 DIAGNOSIS — E1140 Type 2 diabetes mellitus with diabetic neuropathy, unspecified: Secondary | ICD-10-CM | POA: Diagnosis not present

## 2022-10-15 DIAGNOSIS — E1149 Type 2 diabetes mellitus with other diabetic neurological complication: Secondary | ICD-10-CM | POA: Diagnosis not present

## 2022-10-15 DIAGNOSIS — B351 Tinea unguium: Secondary | ICD-10-CM

## 2022-10-15 DIAGNOSIS — M79675 Pain in left toe(s): Secondary | ICD-10-CM

## 2022-10-15 DIAGNOSIS — M79674 Pain in right toe(s): Secondary | ICD-10-CM

## 2022-10-15 NOTE — Progress Notes (Signed)
This patient returns to my office for at risk foot care.  This patient requires this care by a professional since this patient will be at risk due to having diabetes.    This patient is unable to cut nails herself since the patient cannot reach her nails.These nails are painful walking and wearing shoes.  Patient says her big toe left foot is swollen but not very painful  The redness has resolved. This patient presents for at risk foot care today.  General Appearance  Alert, conversant and in no acute stress.  Vascular  Dorsalis pedis and posterior tibial  pulses are not  palpable due to swelling bilaterally.  Capillary return is within normal limits  bilaterally. Temperature is within normal limits  bilaterally.  Neurologic  Senn-Weinstein monofilament wire test within normal limits  bilaterally. Muscle power within normal limits bilaterally.  Nails Thick disfigured discolored nails with subungual debris  3-5 left and 2-5 right.. No evidence of bacterial infection or drainage bilaterally.  Orthopedic  No limitations of motion  feet .  No crepitus or effusions noted.  No bony pathology or digital deformities noted.  HAV  B/L.  Skin  normotropic skin with no porokeratosis noted bilaterally.  No signs of infections or ulcers noted.   Callus sub 1st MPJ right foot.  Onychomycosis  Pain in right toes  Pain in left toes  Callus sub 1 right foot asymptomatic. Arthritis left hallux.  Consent was obtained for treatment procedures.   Mechanical debridement of nails 1-5  bilaterally performed with a nail nipper.  Filed with dremel without incident. Discussed her toe pain and recommended she be evaluated by other podiatrists of problem persists.   Return office visit   3 months                   Told patient to return for periodic foot care and evaluation due to potential at risk complications.   Helane Gunther DPM

## 2022-11-21 ENCOUNTER — Emergency Department
Admission: EM | Admit: 2022-11-21 | Discharge: 2022-11-21 | Disposition: A | Discharge status: Home / Self Care | Payer: Medicare Other | Attending: Emergency Medicine | Admitting: Emergency Medicine

## 2022-11-21 ENCOUNTER — Emergency Department: Payer: Medicare Other

## 2022-11-21 ENCOUNTER — Other Ambulatory Visit: Payer: Self-pay

## 2022-11-21 DIAGNOSIS — W0110XA Fall on same level from slipping, tripping and stumbling with subsequent striking against unspecified object, initial encounter: Secondary | ICD-10-CM | POA: Insufficient documentation

## 2022-11-21 DIAGNOSIS — E119 Type 2 diabetes mellitus without complications: Secondary | ICD-10-CM | POA: Insufficient documentation

## 2022-11-21 DIAGNOSIS — I509 Heart failure, unspecified: Secondary | ICD-10-CM | POA: Insufficient documentation

## 2022-11-21 DIAGNOSIS — S0083XA Contusion of other part of head, initial encounter: Secondary | ICD-10-CM | POA: Diagnosis not present

## 2022-11-21 DIAGNOSIS — M79641 Pain in right hand: Secondary | ICD-10-CM

## 2022-11-21 DIAGNOSIS — Y9301 Activity, walking, marching and hiking: Secondary | ICD-10-CM | POA: Insufficient documentation

## 2022-11-21 DIAGNOSIS — S0990XA Unspecified injury of head, initial encounter: Secondary | ICD-10-CM

## 2022-11-21 DIAGNOSIS — M25462 Effusion, left knee: Secondary | ICD-10-CM | POA: Diagnosis present

## 2022-11-21 DIAGNOSIS — M25562 Pain in left knee: Secondary | ICD-10-CM | POA: Diagnosis not present

## 2022-11-21 DIAGNOSIS — I110 Hypertensive heart disease with heart failure: Secondary | ICD-10-CM | POA: Diagnosis not present

## 2022-11-21 LAB — URINALYSIS, ROUTINE W REFLEX MICROSCOPIC
Bacteria, UA: NONE SEEN
Bilirubin Urine: NEGATIVE
Glucose, UA: >=500 mg/dL — AB
Hgb urine dipstick: NEGATIVE
Ketones, ur: NEGATIVE mg/dL
Leukocytes,Ua: NEGATIVE
Nitrite: NEGATIVE
Protein, ur: NEGATIVE mg/dL
RBC / HPF: 0 RBC/hpf (ref 0–5)
Specific Gravity, Urine: 1.002 — ABNORMAL LOW (ref 1.005–1.030)
pH: 7 (ref 5.0–8.0)

## 2022-11-21 LAB — BASIC METABOLIC PANEL
Anion gap: 11 (ref 5–15)
BUN: 26 mg/dL — ABNORMAL HIGH (ref 8–23)
CO2: 27 mmol/L (ref 22–32)
Calcium: 9.7 mg/dL (ref 8.9–10.3)
Chloride: 96 mmol/L — ABNORMAL LOW (ref 98–111)
Creatinine, Ser: 1.5 mg/dL — ABNORMAL HIGH (ref 0.44–1.00)
GFR, Estimated: 37 mL/min — ABNORMAL LOW (ref >60)
Glucose, Bld: 119 mg/dL — ABNORMAL HIGH (ref 70–99)
Potassium: 4.2 mmol/L (ref 3.5–5.1)
Sodium: 134 mmol/L — ABNORMAL LOW (ref 135–145)

## 2022-11-21 LAB — CBC
HCT: 41.3 % (ref 36.0–46.0)
Hemoglobin: 13.3 g/dL (ref 12.0–15.0)
MCH: 31.1 pg (ref 26.0–34.0)
MCHC: 32.2 g/dL (ref 30.0–36.0)
MCV: 96.7 fL (ref 80.0–100.0)
Platelets: 194 10*3/uL (ref 150–400)
RBC: 4.27 MIL/uL (ref 3.87–5.11)
RDW: 14.3 % (ref 11.5–15.5)
WBC: 6.9 10*3/uL (ref 4.0–10.5)
nRBC: 0 % (ref 0.0–0.2)

## 2022-11-21 LAB — TROPONIN I (HIGH SENSITIVITY)
Troponin I (High Sensitivity): 12 ng/L (ref <18)
Troponin I (High Sensitivity): 12 ng/L (ref <18)

## 2022-11-21 MED ORDER — OXYCODONE-ACETAMINOPHEN 5-325 MG PO TABS
1.0000 | ORAL_TABLET | Freq: Once | ORAL | Status: AC
  Administered 2022-11-21: 1 via ORAL
  Filled 2022-11-21: qty 1

## 2022-11-21 MED ORDER — ONDANSETRON 4 MG PO TBDP
4.0000 mg | ORAL_TABLET | Freq: Once | ORAL | Status: AC
  Administered 2022-11-21: 4 mg via ORAL
  Filled 2022-11-21: qty 1

## 2022-11-21 MED ORDER — OXYCODONE-ACETAMINOPHEN 5-325 MG PO TABS: 1.0000 | ORAL_TABLET | Freq: Four times a day (QID) | ORAL | 0 refills | Status: AC | PRN

## 2022-11-21 NOTE — ED Notes (Signed)
Pt able to ambulate to room toilet using walker and no staff assist safely.

## 2022-11-21 NOTE — Discharge Instructions (Addendum)
You were seen in the ER today for evaluation after fall.  Fortunately we did not find any serious injuries.  As we discussed, there is no fracture in your knee, but it is possible that you could have a injury of your ligament.  I have sent a prescription for pain medicine to your pharmacy that you can take as needed for breakthrough pain not relieved with Tylenol.  Do not drive or operate machinery when taking this.  If you are still having pain in a few days, I have included information for a couple of options for follow-up with orthopedics.  Return to the ER for any new or worsening symptoms including chest pain, difficulty breathing, repeated episodes of passing out, or any other new or concerning symptoms.

## 2022-11-21 NOTE — ED Notes (Signed)
Provided pt with another diet drink as requested.

## 2022-11-21 NOTE — ED Notes (Signed)
Pt ambulated back to bed safely. Provided pt with phone to call sister for tx home.

## 2022-11-21 NOTE — ED Triage Notes (Signed)
Patient does not know what made her fall but says that she fell forward and hit her face on the door (currently has cards monitor on, was placed for dizziness per patient); Takes baby ASA every morning but does not take any blood thinners; Denies LOC, A&O x 4; Has abrasion to forehead and c/o LEFT knee pain (swelling noted)

## 2022-11-21 NOTE — ED Notes (Signed)
ED Provider at bedside. 

## 2022-11-21 NOTE — ED Notes (Signed)
Provided the pt with meal tray and drink as requested.

## 2022-11-21 NOTE — ED Notes (Signed)
Provided pt with discharge instructions and education. All of pt questions answered. Pt in possession of all belongings. Pt AAOX4 and stable at time of discharge.Pt assisted onto wheelchair and tx into family vehicle safely.

## 2022-11-21 NOTE — ED Provider Notes (Signed)
[facility] Provider Note    Event Date/Time   First MD Initiated Contact with Patient 11/21/22 1701     (approximate)   History   Fall (Patient does not know what made her fall but says that she fell forward and hit her face on the door (currently has cards monitor on, was placed for dizziness per patient); Takes baby ASA every morning but does not take any blood thinners; Denies LOC, A&O x 4; Has abrasion to forehead and c/o LEFT knee pain (swelling noted))   HPI  Jodi Chavez is a 72 year old female with history of CHF, HTN, T2DM presenting to the emergency department for evaluation after a fall.  Patient states she was walking to the bathroom with her walker when she fell forward and hit her face.  She remembers the event happening, does not specifically remember tripping, but did not have any loss of consciousness.  Did hit her head.  Takes a baby aspirin, no other anticoagulation.  Did note that she has worsening swelling to her left knee.  Reports a history of osteoarthritis with pain at baseline.  She additionally reports some pain in her right hand not noted in triage.     Physical Exam   Triage Vital Signs: ED Triage Vitals  Encounter Vitals Group     BP 11/21/22 1448 116/67     Systolic BP Percentile --      Diastolic BP Percentile --      Pulse Rate 11/21/22 1448 76     Resp 11/21/22 1448 16     Temp 11/21/22 1448 97.7 F (36.5 C)     Temp Source 11/21/22 1448 Oral     SpO2 11/21/22 1448 96 %     Weight 11/21/22 1452 182 lb (82.6 kg)     Height 11/21/22 1452 5\' 1"  (1.549 m)     Head Circumference --      Peak Flow --      Pain Score 11/21/22 1455 8     Pain Loc --      Pain Education --      Exclude from Growth Chart --     Most recent vital signs: Vitals:   11/21/22 2000 11/21/22 2008  BP:    Pulse: 82 81  Resp: 17 20  Temp:    SpO2: 94% 94%   Nursing notes and vital signs reviewed.  General: Adult female, laying in bed,  awake, reactive Head: Area of ecchymosis and abrasions over the forehead without open areas of skin Chest: Symmetric chest rise, no tenderness to palpation.  Cardiac: Regular rhythm and rate.  Respiratory: Lungs clear to auscultation Abdomen: Soft, nondistended. No tenderness to palpation.  Pelvis: Stable in AP and lateral compression. No tenderness to palpation. MSK: No deformity to bilateral upper and lower extremity.  There is swelling over the left knee with tenderness to palpation primarily along the lateral aspect.  Superficial abrasion, no open areas of skin.  Moving remainder of extremities without significant pain.  Does have some tenderness along the thenar eminence of the right hand.  Neuro: Alert, oriented. GCS 15. 5 out of 5 strength in bilateral upper and lower extremities, slightly pain limited in the left lower extremity. Normal sensation to light touch in bilateral upper and lower extremity. Skin: No evidence of burns or lacerations.   ED Results / Procedures / Treatments   Labs (all labs ordered are listed, but only abnormal results are displayed) Labs Reviewed  BASIC  METABOLIC PANEL - Abnormal; Notable for the following components:      Result Value   Sodium 134 (*)    Chloride 96 (*)    Glucose, Bld 119 (*)    BUN 26 (*)    Creatinine, Ser 1.50 (*)    GFR, Estimated 37 (*)    All other components within normal limits  URINALYSIS, ROUTINE W REFLEX MICROSCOPIC - Abnormal; Notable for the following components:   Color, Urine STRAW (*)    APPearance CLEAR (*)    Specific Gravity, Urine 1.002 (*)    Glucose, UA >=500 (*)    All other components within normal limits  CBC  TROPONIN I (HIGH SENSITIVITY)  TROPONIN I (HIGH SENSITIVITY)     EKG EKG independently reviewed interpreted by myself (ER attending) demonstrates:  EKG demonstrates sinus rhythm with a rate of 76, PR 188, QRS 104, QTc 445, nonspecific T wave abnormality, PVCs noted    RADIOLOGY Imaging  independently reviewed and interpreted by myself demonstrates:  CT head without acute bleed CT C-spine without acute fracture Hand x-Karthik Whittinghill without acute fracture Knee x-Tokiko Diefenderfer without acute fracture  PROCEDURES:  Critical Care performed: No  Procedures   MEDICATIONS ORDERED IN ED: Medications  oxyCODONE-acetaminophen (PERCOCET/ROXICET) 5-325 MG per tablet 1 tablet (has no administration in time range)  oxyCODONE-acetaminophen (PERCOCET/ROXICET) 5-325 MG per tablet 1 tablet (1 tablet Oral Given 11/21/22 1812)  ondansetron (ZOFRAN-ODT) disintegrating tablet 4 mg (4 mg Oral Given 11/21/22 1813)     IMPRESSION / MDM / ASSESSMENT AND PLAN / ED COURSE  I reviewed the triage vital signs and the nursing notes.  Differential diagnosis includes, but is not limited to, intracranial bleed, skull fracture, spine fracture, no evidence of thoracoabdominal trauma consideration for extremity injury  Patient's presentation is most consistent with acute presentation with potential threat to life or bodily function.  72 year old female presenting after a fall.  Denies syncope and she is wearing a Holter monitor and following up with cardiology.  Her labs here are overall reassuring including stable creatinine, negative troponin x 2.  Her imaging is overall reassuring.  She did additionally have significant pain in her left knee, but after some pain medication she was able to ambulate with her walker.  She is comfortable discharge home.  Will DC with prescription for pain medication and information for follow-up with orthopedics.  Strict return precautions provided.  Patient discharged able condition.     FINAL CLINICAL IMPRESSION(S) / ED DIAGNOSES   Final diagnoses:  Injury of head, initial encounter  Pain of right hand  Acute pain of left knee     Rx / DC Orders   ED Discharge Orders          Ordered    oxyCODONE-acetaminophen (PERCOCET) 5-325 MG tablet  Every 6 hours PRN        11/21/22 2033              Note:  This document was prepared using Dragon voice recognition software and may include unintentional dictation errors.   Trinna Post, MD 11/21/22 [phone]

## 2022-11-30 NOTE — Progress Notes (Unsigned)
Advanced Heart Failure Clinic Note   Referring Physician: PCP: Fleet Contras, MD Cardiologist: None   HPI:  [name] is a 72 y/o female with a history of  Admitted 08/11/22 due to chest discomfort, shortness of breath, orthopnea and bilateral leg edema. Saturation dropped to 87% on room air and improved to 97% on 2 L/min oxygen. Troponins went from 1 62-234 and BNP was 1,096 on admission. IV diuresed. Elevated troponin thought to be due to demand ischemia.    Admitted 09/08/22 due to syncope. Has shortness of breath, no chest pain, cough. Patient has nausea, no vomiting, diarrhea or abdominal pain. Denies symptoms of UTI. She states that she passed out for approximately 2 minutes. EMS reports initial blood pressure was in 70s at the scene. She received 1 L LR bolus in ED. trop 28 --> 28, BNP 927, Positive D-dimer 1.38. CT head negative for acute intracranial abnormalities. CTA negative for PE. Lower extremity venous Doppler negative for DVT. Orthostatics show BP drops but HR does not increase. PRN torsemide at discharge. O2 @ 2L. Was in the ED 11/21/22 due to a fall but says that she fell forward and hit her face on the door. Denies LOC but did hit her head.    Echo 10/07/19: EF 50-55% with mild MR and normal PA pressure Echo 08/12/22: EF 50-55% with trivial MR  Stress test 03/11/20: LVEF= 60%  FINDINGS:  Regional wall motion:  reveals normal myocardial thickening and wall motion.  The overall quality of the study is good.   Artifacts noted: yes  Left ventricular cavity: normal.   She presents today for her initial visit with a chief complaint of     Review of Systems: [y] = yes, [ ]  = no   General: Weight gain [ ] ; Weight loss [ ] ; Anorexia [ ] ; Fatigue [ ] ; Fever [ ] ; Chills [ ] ; Weakness [ ]   Cardiac: Chest pain/pressure [ ] ; Resting SOB [ ] ; Exertional SOB [ ] ; Orthopnea [ ] ; Pedal Edema [ ] ; Palpitations [ ] ; Syncope [ ] ; Presyncope [ ] ; Paroxysmal nocturnal dyspnea[ ]   Pulmonary:  Cough [ ] ; Wheezing[ ] ; Hemoptysis[ ] ; Sputum [ ] ; Snoring [ ]   GI: Vomiting[ ] ; Dysphagia[ ] ; Melena[ ] ; Hematochezia [ ] ; Heartburn[ ] ; Abdominal pain [ ] ; Constipation [ ] ; Diarrhea [ ] ; BRBPR [ ]   GU: Hematuria[ ] ; Dysuria [ ] ; Nocturia[ ]   Vascular: Pain in legs with walking [ ] ; Pain in feet with lying flat [ ] ; Non-healing sores [ ] ; Stroke [ ] ; TIA [ ] ; Slurred speech [ ] ;  Neuro: Headaches[ ] ; Vertigo[ ] ; Seizures[ ] ; Paresthesias[ ] ;Blurred vision [ ] ; Diplopia [ ] ; Vision changes [ ]   Ortho/Skin: Arthritis [ ] ; Joint pain [ ] ; Muscle pain [ ] ; Joint swelling [ ] ; Back Pain [ ] ; Rash [ ]   Psych: Depression[ ] ; Anxiety[ ]   Heme: Bleeding problems [ ] ; Clotting disorders [ ] ; Anemia [ ]   Endocrine: Diabetes [ ] ; Thyroid dysfunction[ ]    Past Medical History:  Diagnosis Date   Acute respiratory failure with hypoxia (HCC) 05/27/2022   Arthritis    hip   CHF (congestive heart failure) (HCC)    Constipation    uses laxatives several times a week   Depression    Diabetes mellitus    takes Metformin and Amaryl daily   Dizziness    occasionally and related to meds    Early cataracts, bilateral    History of bronchitis    last  time several yrs ago   History of migraine    many yrs ago   Hyperlipidemia    takes Zetia and Zocor daily   Hypertension    takes Benazepril nightly and Propranolol tid and Clonidine daily   Insomnia    Low back pain    Peripheral edema    Peripheral neuropathy    PONV (postoperative nausea and vomiting)    Slow urinary stream    occasionally    Current Outpatient Medications  Medication Sig Dispense Refill   ALPRAZolam (XANAX) 0.25 MG tablet Take 0.25 mg by mouth at bedtime as needed.     ascorbic acid (VITAMIN C) 500 MG tablet Take 500 mg by mouth daily.     aspirin 81 MG EC tablet Take 81 mg by mouth daily.     calcium-vitamin D (OSCAL WITH D) 500-200 MG-UNIT TABS tablet Take 1 tablet by mouth daily.     empagliflozin (JARDIANCE) 10 MG TABS  tablet Take 1 tablet (10 mg total) by mouth daily.     ezetimibe (ZETIA) 10 MG tablet Take 10 mg by mouth at bedtime.     gabapentin (NEURONTIN) 300 MG capsule Take 300 mg by mouth 3 (three) times daily.     Garlic 1000 MG CAPS Take 5,000 mg by mouth daily after breakfast.      Glucosamine-Chondroitin 500-400 MG CAPS Take 1 tablet by mouth daily.     loratadine (CLARITIN) 10 MG tablet Take 1 tablet (10 mg total) by mouth daily.     magnesium oxide (MAG-OX) 400 MG tablet Take 400 mg by mouth daily.     metFORMIN (GLUCOPHAGE) 850 MG tablet Take 850 mg by mouth daily with breakfast.     metoprolol succinate (TOPROL-XL) 50 MG 24 hr tablet Take 50 mg by mouth daily.     Multiple Vitamin (MULITIVITAMIN WITH MINERALS) TABS Take 1 tablet by mouth daily after breakfast.      ondansetron (ZOFRAN) 4 MG tablet Take 1 tablet (4 mg total) by mouth every 8 (eight) hours as needed for nausea or vomiting. 30 tablet 0   pantoprazole (PROTONIX) 40 MG tablet Take 1 tablet (40 mg total) by mouth daily. (Patient not taking: Reported on 09/08/2022) 30 tablet 2   polyethylene glycol (MIRALAX / GLYCOLAX) 17 g packet Take 17 g by mouth 2 (two) times daily. 30 each 0   simvastatin (ZOCOR) 40 MG tablet Take 40 mg by mouth every evening.     torsemide (DEMADEX) 20 MG tablet Take 1 tablet (20 mg total) by mouth daily as needed (for increased swelling or weight gain).     zinc sulfate 220 (50 Zn) MG capsule Take 220 mg by mouth daily.     No current facility-administered medications for this visit.    Allergies  Allergen Reactions   Naproxen Sodium Rash    anaprox      Social History   Socioeconomic History   Marital status: Single    Spouse name: Not on file   Number of children: Not on file   Years of education: Not on file   Highest education level: Not on file  Occupational History   Not on file  Tobacco Use   Smoking status: Never   Smokeless tobacco: Never  Substance and Sexual Activity   Alcohol use:  No   Drug use: No   Sexual activity: Never  Other Topics Concern   Not on file  Social History Narrative   Not on file  Social Determinants of Health   Financial Resource Strain: Not on file  Food Insecurity: No Food Insecurity (09/08/2022)   Hunger Vital Sign    Worried About Running Out of Food in the Last Year: Never true    Ran Out of Food in the Last Year: Never true  Transportation Needs: No Transportation Needs (09/08/2022)   PRAPARE - Administrator, Civil Service (Medical): No    Lack of Transportation (Non-Medical): No  Physical Activity: Not on file  Stress: Not on file  Social Connections: Not on file  Intimate Partner Violence: Not At Risk (09/08/2022)   Humiliation, Afraid, Rape, and Kick questionnaire    Fear of Current or Ex-Partner: No    Emotionally Abused: No    Physically Abused: No    Sexually Abused: No      Family History  Problem Relation Age of Onset   Cancer Mother    Stroke Mother    Diabetes Mother    Hypertension Mother    Colon cancer Mother 64       died 01-Jan-2006   Gout Father    Heart disease Father        PHYSICAL EXAM: General:  Well appearing. No respiratory difficulty HEENT: normal Neck: supple. no JVD. No lymphadenopathy or thyromegaly appreciated. Cor: PMI nondisplaced. Regular rate & rhythm. No rubs, gallops or murmurs. Lungs: clear Abdomen: soft, nontender, nondistended. No hepatosplenomegaly. No bruits or masses.  Extremities: no cyanosis, clubbing, rash, edema Neuro: alert & oriented x 3, cranial nerves grossly intact. moves all 4 extremities w/o difficulty. Affect pleasant.  ECG:   ASSESSMENT & PLAN:  1: Chronic heart failure with preserved ejection fraction- - suspect due to  - NYHA class - euvolemic - weighing daily - Echo 10/07/19: EF 50-55% with mild MR and normal PA pressure - Echo 08/12/22: EF 50-55% with trivial MR - continue  - BNP  2: HTN- - BP - saw PCP - BMP  3: DM- - A1c  4:  CAD- - saw cardiology - Stress test 03/11/20:   LVEF= 60%    FINDINGS:    Regional wall motion:  reveals normal myocardial thickening and wall motion.    The overall quality of the study is good.     Artifacts noted: yes    Left ventricular cavity: normal.    Jodi Freeze, FNP 11/30/22

## 2022-12-03 ENCOUNTER — Encounter: Payer: Self-pay | Admitting: Family

## 2022-12-03 ENCOUNTER — Ambulatory Visit: Payer: Medicare Other | Attending: Family | Admitting: Family

## 2022-12-03 VITALS — BP 147/59 | HR 73 | Ht 61.0 in | Wt 184.0 lb

## 2022-12-03 DIAGNOSIS — I350 Nonrheumatic aortic (valve) stenosis: Secondary | ICD-10-CM | POA: Insufficient documentation

## 2022-12-03 DIAGNOSIS — F32A Depression, unspecified: Secondary | ICD-10-CM | POA: Diagnosis not present

## 2022-12-03 DIAGNOSIS — R0602 Shortness of breath: Secondary | ICD-10-CM | POA: Diagnosis not present

## 2022-12-03 DIAGNOSIS — Z79899 Other long term (current) drug therapy: Secondary | ICD-10-CM | POA: Insufficient documentation

## 2022-12-03 DIAGNOSIS — I5032 Chronic diastolic (congestive) heart failure: Secondary | ICD-10-CM | POA: Diagnosis present

## 2022-12-03 DIAGNOSIS — E1122 Type 2 diabetes mellitus with diabetic chronic kidney disease: Secondary | ICD-10-CM

## 2022-12-03 DIAGNOSIS — R55 Syncope and collapse: Secondary | ICD-10-CM | POA: Insufficient documentation

## 2022-12-03 DIAGNOSIS — Z9181 History of falling: Secondary | ICD-10-CM | POA: Insufficient documentation

## 2022-12-03 DIAGNOSIS — I110 Hypertensive heart disease with heart failure: Secondary | ICD-10-CM | POA: Insufficient documentation

## 2022-12-03 DIAGNOSIS — Z7984 Long term (current) use of oral hypoglycemic drugs: Secondary | ICD-10-CM | POA: Insufficient documentation

## 2022-12-03 DIAGNOSIS — I2510 Atherosclerotic heart disease of native coronary artery without angina pectoris: Secondary | ICD-10-CM | POA: Diagnosis not present

## 2022-12-03 DIAGNOSIS — I10 Essential (primary) hypertension: Secondary | ICD-10-CM | POA: Diagnosis not present

## 2022-12-03 DIAGNOSIS — E785 Hyperlipidemia, unspecified: Secondary | ICD-10-CM | POA: Insufficient documentation

## 2022-12-03 DIAGNOSIS — N1832 Chronic kidney disease, stage 3b: Secondary | ICD-10-CM

## 2022-12-03 DIAGNOSIS — E1142 Type 2 diabetes mellitus with diabetic polyneuropathy: Secondary | ICD-10-CM | POA: Insufficient documentation

## 2022-12-03 DIAGNOSIS — I493 Ventricular premature depolarization: Secondary | ICD-10-CM | POA: Insufficient documentation

## 2022-12-03 NOTE — Patient Instructions (Signed)
It was nice to meet you today!  Continue weighing daily and call for an overnight weight gain of 3 pounds or more or a weekly weight gain of more than 5 pounds.

## 2022-12-18 ENCOUNTER — Other Ambulatory Visit: Payer: Self-pay | Admitting: Nurse Practitioner

## 2022-12-18 DIAGNOSIS — R9439 Abnormal result of other cardiovascular function study: Secondary | ICD-10-CM

## 2022-12-18 DIAGNOSIS — I25119 Atherosclerotic heart disease of native coronary artery with unspecified angina pectoris: Secondary | ICD-10-CM

## 2022-12-25 ENCOUNTER — Telehealth (HOSPITAL_COMMUNITY): Payer: Self-pay | Admitting: *Deleted

## 2022-12-25 NOTE — Telephone Encounter (Signed)
Reaching out to patient to offer assistance regarding upcoming cardiac imaging study; pt verbalizes understanding of appt date/time, parking situation and where to check in, pre-test NPO status and medications ordered, and verified current allergies; name and call back number provided for further questions should they arise Johney Frame RN Navigator Cardiac Imaging [facility] [phone] office [phone] cell  Patient reports HR 63-64, denies contrast allergy.

## 2022-12-25 NOTE — Telephone Encounter (Signed)
Attempted to call patient regarding upcoming cardiac CT appointment. Left message on voicemail with name and callback number Hayley Sharpe RN Navigator Cardiac Imaging [facility] [phone] Office   

## 2022-12-26 ENCOUNTER — Other Ambulatory Visit: Payer: Self-pay | Admitting: Nurse Practitioner

## 2022-12-26 ENCOUNTER — Ambulatory Visit
Admission: RE | Admit: 2022-12-26 | Discharge: 2022-12-26 | Disposition: A | Discharge status: Home / Self Care | Payer: Medicare Other | Source: Ambulatory Visit | Attending: Nurse Practitioner | Admitting: Nurse Practitioner

## 2022-12-26 DIAGNOSIS — R9439 Abnormal result of other cardiovascular function study: Secondary | ICD-10-CM | POA: Insufficient documentation

## 2022-12-26 DIAGNOSIS — R931 Abnormal findings on diagnostic imaging of heart and coronary circulation: Secondary | ICD-10-CM

## 2022-12-26 DIAGNOSIS — I25119 Atherosclerotic heart disease of native coronary artery with unspecified angina pectoris: Secondary | ICD-10-CM | POA: Insufficient documentation

## 2022-12-26 LAB — POCT I-STAT CREATININE
Creatinine, Ser: 1.6 mg/dL — ABNORMAL HIGH (ref 0.44–1.00)
Creatinine, Ser: 1.6 mg/dL — ABNORMAL HIGH (ref 0.44–1.00)

## 2022-12-26 MED ORDER — METOPROLOL TARTRATE 5 MG/5ML IV SOLN
INTRAVENOUS | Status: AC
  Filled 2022-12-26: qty 5

## 2022-12-26 MED ORDER — METOPROLOL TARTRATE 5 MG/5ML IV SOLN
5.0000 mg | Freq: Once | INTRAVENOUS | Status: AC
  Administered 2022-12-26: 2.5 mg via INTRAVENOUS
  Filled 2022-12-26: qty 5

## 2022-12-26 MED ORDER — IOHEXOL 350 MG/ML SOLN
65.0000 mL | Freq: Once | INTRAVENOUS | Status: AC | PRN
  Administered 2022-12-26: 65 mL via INTRAVENOUS

## 2022-12-26 MED ORDER — NITROGLYCERIN 0.4 MG SL SUBL
0.8000 mg | SUBLINGUAL_TABLET | Freq: Once | SUBLINGUAL | Status: AC
  Administered 2022-12-26: 0.8 mg via SUBLINGUAL

## 2022-12-26 NOTE — Progress Notes (Signed)

## 2022-12-31 ENCOUNTER — Other Ambulatory Visit
Admission: RE | Admit: 2022-12-31 | Discharge: 2022-12-31 | Disposition: A | Discharge status: Home / Self Care | Payer: Medicare Other | Source: Ambulatory Visit | Attending: Nurse Practitioner | Admitting: Nurse Practitioner

## 2022-12-31 DIAGNOSIS — I25119 Atherosclerotic heart disease of native coronary artery with unspecified angina pectoris: Secondary | ICD-10-CM | POA: Insufficient documentation

## 2022-12-31 DIAGNOSIS — I5020 Unspecified systolic (congestive) heart failure: Secondary | ICD-10-CM | POA: Insufficient documentation

## 2022-12-31 DIAGNOSIS — R0609 Other forms of dyspnea: Secondary | ICD-10-CM | POA: Insufficient documentation

## 2022-12-31 DIAGNOSIS — Z01810 Encounter for preprocedural cardiovascular examination: Secondary | ICD-10-CM | POA: Diagnosis present

## 2022-12-31 LAB — BRAIN NATRIURETIC PEPTIDE: B Natriuretic Peptide: 1168.3 pg/mL — ABNORMAL HIGH (ref 0.0–100.0)

## 2023-01-04 DIAGNOSIS — R931 Abnormal findings on diagnostic imaging of heart and coronary circulation: Secondary | ICD-10-CM

## 2023-01-09 ENCOUNTER — Ambulatory Visit: Payer: Medicare Other | Admitting: Cardiology

## 2023-01-09 ENCOUNTER — Telehealth: Payer: Self-pay | Admitting: Cardiology

## 2023-01-09 NOTE — Telephone Encounter (Signed)
Patient is returning phone call.  °

## 2023-01-09 NOTE — Progress Notes (Deleted)
  Electrophysiology Office Note:    Date:  01/09/2023   ID:  Jodi Chavez, DOB 08-02-1950, MRN [id number]  [facility] Cardiologist:  None  [facility] Electrophysiologist:  Lanier Prude, MD   Referring MD: Germaine Pomfret, Chari Manning*   Chief Complaint: PVCs  History of Present Illness:      Discussed the use of AI scribe software for clinical note transcription with the patient, who gave verbal consent to proceed.  History of Present Illness                   Their past medical, social and family history was reveiwed.   ROS:   Please see the history of present illness.    All other systems reviewed and are negative.  EKGs/Labs/Other Studies Reviewed:    The following studies were reviewed today:         Physical Exam:    VS:  There were no vitals taken for this visit.    Wt Readings from Last 3 Encounters:  12/03/22 184 lb (83.5 kg)  11/21/22 182 lb (82.6 kg)  09/11/22 184 lb 15.5 oz (83.9 kg)     Physical Exam               ASSESSMENT AND PLAN:    No diagnosis found.    Assessment and Plan            Repeat holter after LH        Signed, Sheria Lang T. Lalla Brothers, MD, [facility], [facility] 01/09/2023 5:45 AM    Electrophysiology [facility]

## 2023-01-09 NOTE — Telephone Encounter (Signed)
Left message for patient to call back  

## 2023-01-09 NOTE — Telephone Encounter (Signed)
Spoke with the patient and rescheduled her appointment with Dr. Lalla Brothers until after her heart cath is complete.

## 2023-01-09 NOTE — Telephone Encounter (Signed)
Pt returning nurse's call. Please advise

## 2023-01-14 ENCOUNTER — Other Ambulatory Visit: Payer: Self-pay

## 2023-01-14 ENCOUNTER — Ambulatory Visit
Admission: RE | Admit: 2023-01-14 | Discharge: 2023-01-14 | Disposition: A | Discharge status: Home / Self Care | Payer: Medicare Other | Source: Ambulatory Visit | Attending: Internal Medicine | Admitting: Internal Medicine

## 2023-01-14 ENCOUNTER — Encounter: Payer: Medicare Other | Admitting: Family

## 2023-01-14 ENCOUNTER — Encounter
Admission: RE | Disposition: A | Discharge status: Home / Self Care | Payer: Self-pay | Source: Ambulatory Visit | Attending: Internal Medicine

## 2023-01-14 DIAGNOSIS — I2582 Chronic total occlusion of coronary artery: Secondary | ICD-10-CM | POA: Diagnosis not present

## 2023-01-14 DIAGNOSIS — Z7982 Long term (current) use of aspirin: Secondary | ICD-10-CM | POA: Insufficient documentation

## 2023-01-14 DIAGNOSIS — R931 Abnormal findings on diagnostic imaging of heart and coronary circulation: Secondary | ICD-10-CM | POA: Diagnosis present

## 2023-01-14 DIAGNOSIS — I5A Non-ischemic myocardial injury (non-traumatic): Secondary | ICD-10-CM

## 2023-01-14 DIAGNOSIS — Z7902 Long term (current) use of antithrombotics/antiplatelets: Secondary | ICD-10-CM | POA: Insufficient documentation

## 2023-01-14 DIAGNOSIS — I2584 Coronary atherosclerosis due to calcified coronary lesion: Secondary | ICD-10-CM | POA: Insufficient documentation

## 2023-01-14 DIAGNOSIS — I2510 Atherosclerotic heart disease of native coronary artery without angina pectoris: Secondary | ICD-10-CM | POA: Diagnosis not present

## 2023-01-14 HISTORY — PX: LEFT HEART CATH AND CORONARY ANGIOGRAPHY: CATH118249

## 2023-01-14 LAB — BASIC METABOLIC PANEL
Anion gap: 11 (ref 5–15)
BUN: 31 mg/dL — ABNORMAL HIGH (ref 8–23)
CO2: 29 mmol/L (ref 22–32)
Calcium: 10 mg/dL (ref 8.9–10.3)
Chloride: 97 mmol/L — ABNORMAL LOW (ref 98–111)
Creatinine, Ser: 1.57 mg/dL — ABNORMAL HIGH (ref 0.44–1.00)
GFR, Estimated: 35 mL/min — ABNORMAL LOW (ref >60)
Glucose, Bld: 121 mg/dL — ABNORMAL HIGH (ref 70–99)
Potassium: 4.4 mmol/L (ref 3.5–5.1)
Sodium: 137 mmol/L (ref 135–145)

## 2023-01-14 LAB — GLUCOSE, CAPILLARY: Glucose-Capillary: 132 mg/dL — ABNORMAL HIGH (ref 70–99)

## 2023-01-14 LAB — CARDIAC CATHETERIZATION: Cath EF Quantitative: 30 %

## 2023-01-14 SURGERY — LEFT HEART CATH AND CORONARY ANGIOGRAPHY
Anesthesia: Moderate Sedation | Laterality: Left

## 2023-01-14 MED ORDER — FENTANYL CITRATE (PF) 100 MCG/2ML IJ SOLN
INTRAMUSCULAR | Status: AC
  Filled 2023-01-14: qty 2

## 2023-01-14 MED ORDER — FENTANYL CITRATE (PF) 100 MCG/2ML IJ SOLN
INTRAMUSCULAR | Status: DC | PRN
  Administered 2023-01-14: 25 ug via INTRAVENOUS

## 2023-01-14 MED ORDER — SODIUM CHLORIDE 0.9 % IV SOLN: 250.0000 mL | INTRAVENOUS | Status: DC | PRN

## 2023-01-14 MED ORDER — HEPARIN SODIUM (PORCINE) 1000 UNIT/ML IJ SOLN
INTRAMUSCULAR | Status: DC | PRN
  Administered 2023-01-14: 4000 [IU] via INTRAVENOUS

## 2023-01-14 MED ORDER — LABETALOL HCL 5 MG/ML IV SOLN: 10.0000 mg | INTRAVENOUS | Status: DC | PRN

## 2023-01-14 MED ORDER — SODIUM CHLORIDE 0.9% FLUSH: 3.0000 mL | Freq: Two times a day (BID) | INTRAVENOUS | Status: DC

## 2023-01-14 MED ORDER — SODIUM CHLORIDE 0.9% FLUSH: 3.0000 mL | INTRAVENOUS | Status: DC | PRN

## 2023-01-14 MED ORDER — VERAPAMIL HCL 2.5 MG/ML IV SOLN
INTRAVENOUS | Status: AC
  Filled 2023-01-14: qty 2

## 2023-01-14 MED ORDER — SODIUM CHLORIDE 0.9 % WEIGHT BASED INFUSION: 1.0000 mL/kg/h | INTRAVENOUS | Status: DC

## 2023-01-14 MED ORDER — HEPARIN (PORCINE) IN NACL 2000-0.9 UNIT/L-% IV SOLN
INTRAVENOUS | Status: DC | PRN
  Administered 2023-01-14: 1000 mL

## 2023-01-14 MED ORDER — HEPARIN (PORCINE) IN NACL 1000-0.9 UT/500ML-% IV SOLN
INTRAVENOUS | Status: AC
  Filled 2023-01-14: qty 1000

## 2023-01-14 MED ORDER — HEPARIN SODIUM (PORCINE) 1000 UNIT/ML IJ SOLN
INTRAMUSCULAR | Status: AC
  Filled 2023-01-14: qty 10

## 2023-01-14 MED ORDER — ONDANSETRON HCL 4 MG/2ML IJ SOLN: 4.0000 mg | Freq: Four times a day (QID) | INTRAMUSCULAR | Status: DC | PRN

## 2023-01-14 MED ORDER — IOHEXOL 300 MG/ML  SOLN
INTRAMUSCULAR | Status: DC | PRN
  Administered 2023-01-14: 90 mL

## 2023-01-14 MED ORDER — MIDAZOLAM HCL 2 MG/2ML IJ SOLN
INTRAMUSCULAR | Status: AC
  Filled 2023-01-14: qty 2

## 2023-01-14 MED ORDER — VERAPAMIL HCL 2.5 MG/ML IV SOLN
INTRAVENOUS | Status: DC | PRN
  Administered 2023-01-14: 2.5 mg via INTRAVENOUS

## 2023-01-14 MED ORDER — ACETAMINOPHEN 325 MG PO TABS: 650.0000 mg | ORAL_TABLET | ORAL | Status: DC | PRN

## 2023-01-14 MED ORDER — ASPIRIN 81 MG PO CHEW: 81.0000 mg | CHEWABLE_TABLET | ORAL | Status: DC

## 2023-01-14 MED ORDER — SODIUM CHLORIDE 0.9 % IV SOLN: INTRAVENOUS | Status: DC

## 2023-01-14 MED ORDER — MIDAZOLAM HCL 2 MG/2ML IJ SOLN
INTRAMUSCULAR | Status: DC | PRN
  Administered 2023-01-14: 1 mg via INTRAVENOUS

## 2023-01-14 SURGICAL SUPPLY — 11 items
CATH 5FR JL3.5 JR4 ANG PIG MP (CATHETERS) IMPLANT
DEVICE RAD TR BAND REGULAR (VASCULAR PRODUCTS) IMPLANT
DRAPE BRACHIAL (DRAPES) IMPLANT
GLIDESHEATH SLEND SS 6F .021 (SHEATH) IMPLANT
GUIDEWIRE INQWIRE 1.5J.035X260 (WIRE) IMPLANT
INQWIRE 1.5J .035X260CM (WIRE) ×1
PACK CARDIAC CATH (CUSTOM PROCEDURE TRAY) ×1 IMPLANT
PANNUS RETENTION SYSTEM 2 PAD (MISCELLANEOUS) IMPLANT
PROTECTION STATION PRESSURIZED (MISCELLANEOUS) ×1
SET ATX-X65L (MISCELLANEOUS) IMPLANT
STATION PROTECTION PRESSURIZED (MISCELLANEOUS) IMPLANT

## 2023-01-14 NOTE — Discharge Instructions (Signed)
Radial Site Care Refer to this sheet in the next few weeks. These instructions provide you with information about caring for yourself after your procedure. Your health care provider may also give you more specific instructions. Your treatment has been planned according to current medical practices, but problems sometimes occur. Call your health care provider if you have any problems or questions after your procedure. What can I expect after the procedure? After your procedure, it is typical to have the following: Bruising at the radial site that usually fades within 1-2 weeks. Blood collecting in the tissue (hematoma) that may be painful to the touch. It should usually decrease in size and tenderness within 1-2 weeks.  Follow these instructions at home: Take medicines only as directed by your health care provider. If you are on a medication called Metformin please do not take for 48 hours after your procedure. Over the next 48hrs please increase your fluid intake of water and non caffeine beverages to flush the contrast dye out of your system.  You may shower 24 hours after the procedure  Leave your bandage on and gently wash the site with plain soap and water. Pat the area dry with a clean towel. Do not rub the site, because this may cause bleeding.  Remove your dressing 48hrs after your procedure and leave open to air.  Do not submerge your site in water for 7 days. This includes swimming and washing dishes.  Check your insertion site every day for redness, swelling, or drainage. Do not apply powder or lotion to the site. Do not flex or bend the affected arm for 24 hours or as directed by your health care provider. Do not push or pull heavy objects with the affected arm for 24 hours or as directed by your health care provider. Do not lift over 10 lb (4.5 kg) for 5 days after your procedure or as directed by your health care provider. Ask your health care provider when it is okay to: Return to  work or school. Resume usual physical activities or sports. Resume sexual activity. Do not drive home if you are discharged the same day as the procedure. Have someone else drive you. You may drive 48 hours after the procedure Do not operate machinery or power tools for 24 hours after the procedure. If your procedure was done as an outpatient procedure, which means that you went home the same day as your procedure, a responsible adult should be with you for the first 24 hours after you arrive home. Keep all follow-up visits as directed by your health care provider. This is important. Contact a health care provider if: You have a fever. You have chills. You have increased bleeding from the radial site. Hold pressure on the site. Get help right away if: You have unusual pain at the radial site. You have redness, warmth, or swelling at the radial site. You have drainage (other than a small amount of blood on the dressing) from the radial site. The radial site is bleeding, and the bleeding does not stop after 15 minutes of holding steady pressure on the site. Your arm or hand becomes pale, cool, tingly, or numb. This information is not intended to replace advice given to you by your health care provider. Make sure you discuss any questions you have with your health care provider. Document Released: 03/17/2010 Document Revised: 07/21/2015 Document Reviewed: 08/31/2013 Elsevier Interactive Patient Education  2018 [employer]  

## 2023-01-15 ENCOUNTER — Encounter: Payer: Self-pay | Admitting: Internal Medicine

## 2023-01-16 ENCOUNTER — Telehealth: Payer: Self-pay | Admitting: Cardiology

## 2023-01-16 NOTE — Telephone Encounter (Signed)
Spoke with the patient, I had originally called her last week to reschedule her appointment with Dr. Lalla Brothers until after her heart cath. Patient's appointment has already been rescheduled.

## 2023-01-16 NOTE — Telephone Encounter (Signed)
Patient stated she is returning RN Carlyle's call.

## 2023-01-21 ENCOUNTER — Encounter: Payer: Self-pay | Admitting: Podiatry

## 2023-01-21 ENCOUNTER — Ambulatory Visit (INDEPENDENT_AMBULATORY_CARE_PROVIDER_SITE_OTHER): Payer: Medicare Other | Admitting: Podiatry

## 2023-01-21 DIAGNOSIS — E1140 Type 2 diabetes mellitus with diabetic neuropathy, unspecified: Secondary | ICD-10-CM | POA: Diagnosis not present

## 2023-01-21 DIAGNOSIS — B351 Tinea unguium: Secondary | ICD-10-CM | POA: Diagnosis not present

## 2023-01-21 DIAGNOSIS — E1149 Type 2 diabetes mellitus with other diabetic neurological complication: Secondary | ICD-10-CM

## 2023-01-21 DIAGNOSIS — M79674 Pain in right toe(s): Secondary | ICD-10-CM

## 2023-01-21 DIAGNOSIS — M79675 Pain in left toe(s): Secondary | ICD-10-CM | POA: Diagnosis not present

## 2023-01-21 NOTE — Progress Notes (Signed)
This patient returns to my office for at risk foot care.  This patient requires this care by a professional since this patient will be at risk due to having diabetes.    This patient is unable to cut nails herself since the patient cannot reach her nails.These nails are painful walking and wearing shoes.  Patient says her big toe left foot is swollen but not very painful  The redness has resolved. This patient presents for at risk foot care today.  General Appearance  Alert, conversant and in no acute stress.  Vascular  Dorsalis pedis and posterior tibial  pulses are not  palpable due to swelling bilaterally.  Capillary return is within normal limits  bilaterally. Temperature is within normal limits  bilaterally.  Neurologic  Senn-Weinstein monofilament wire test within normal limits  bilaterally. Muscle power within normal limits bilaterally.  Nails Thick disfigured discolored nails with subungual debris  3-5 left and 2-5 right.. No evidence of bacterial infection or drainage bilaterally.  Orthopedic  No limitations of motion  feet .  No crepitus or effusions noted.  No bony pathology or digital deformities noted.  HAV  B/L.  Skin  normotropic skin with no porokeratosis noted bilaterally.  No signs of infections or ulcers noted.   Callus sub 1st MPJ right foot.  Onychomycosis  Pain in right toes  Pain in left toes  Callus sub 1 right foot asymptomatic. Arthritis left hallux.  Consent was obtained for treatment procedures.   Mechanical debridement of nails 1-5  bilaterally performed with a nail nipper.  Filed with dremel without incident. Discussed her toe pain and recommended she be evaluated by other podiatrists of problem persists.   Return office visit   3 months                   Told patient to return for periodic foot care and evaluation due to potential at risk complications.   Helane Gunther DPM

## 2023-01-22 ENCOUNTER — Emergency Department
Admission: EM | Admit: 2023-01-22 | Discharge: 2023-01-22 | Disposition: A | Discharge status: Home / Self Care | Payer: Medicare Other | Attending: Emergency Medicine | Admitting: Emergency Medicine

## 2023-01-22 DIAGNOSIS — N183 Chronic kidney disease, stage 3 unspecified: Secondary | ICD-10-CM | POA: Insufficient documentation

## 2023-01-22 DIAGNOSIS — E860 Dehydration: Secondary | ICD-10-CM | POA: Insufficient documentation

## 2023-01-22 DIAGNOSIS — I130 Hypertensive heart and chronic kidney disease with heart failure and stage 1 through stage 4 chronic kidney disease, or unspecified chronic kidney disease: Secondary | ICD-10-CM | POA: Diagnosis not present

## 2023-01-22 DIAGNOSIS — E1122 Type 2 diabetes mellitus with diabetic chronic kidney disease: Secondary | ICD-10-CM | POA: Diagnosis not present

## 2023-01-22 DIAGNOSIS — I5030 Unspecified diastolic (congestive) heart failure: Secondary | ICD-10-CM | POA: Diagnosis not present

## 2023-01-22 DIAGNOSIS — R110 Nausea: Secondary | ICD-10-CM | POA: Insufficient documentation

## 2023-01-22 DIAGNOSIS — R42 Dizziness and giddiness: Secondary | ICD-10-CM | POA: Diagnosis not present

## 2023-01-22 LAB — CBC WITH DIFFERENTIAL/PLATELET
Abs Immature Granulocytes: 0.02 10*3/uL (ref 0.00–0.07)
Basophils Absolute: 0 10*3/uL (ref 0.0–0.1)
Basophils Relative: 0 %
Eosinophils Absolute: 0.1 10*3/uL (ref 0.0–0.5)
Eosinophils Relative: 2 %
HCT: 37.9 % (ref 36.0–46.0)
Hemoglobin: 12.6 g/dL (ref 12.0–15.0)
Immature Granulocytes: 0 %
Lymphocytes Relative: 20 %
Lymphs Abs: 1.3 10*3/uL (ref 0.7–4.0)
MCH: 31.6 pg (ref 26.0–34.0)
MCHC: 33.2 g/dL (ref 30.0–36.0)
MCV: 95 fL (ref 80.0–100.0)
Monocytes Absolute: 0.5 10*3/uL (ref 0.1–1.0)
Monocytes Relative: 8 %
Neutro Abs: 4.5 10*3/uL (ref 1.7–7.7)
Neutrophils Relative %: 70 %
Platelets: 170 10*3/uL (ref 150–400)
RBC: 3.99 MIL/uL (ref 3.87–5.11)
RDW: 15 % (ref 11.5–15.5)
WBC: 6.4 10*3/uL (ref 4.0–10.5)
nRBC: 0 % (ref 0.0–0.2)

## 2023-01-22 LAB — LIPASE, BLOOD: Lipase: 37 U/L (ref 11–51)

## 2023-01-22 LAB — COMPREHENSIVE METABOLIC PANEL
ALT: 17 U/L (ref 0–44)
AST: 27 U/L (ref 15–41)
Albumin: 3.6 g/dL (ref 3.5–5.0)
Alkaline Phosphatase: 57 U/L (ref 38–126)
Anion gap: 11 (ref 5–15)
BUN: 34 mg/dL — ABNORMAL HIGH (ref 8–23)
CO2: 28 mmol/L (ref 22–32)
Calcium: 9 mg/dL (ref 8.9–10.3)
Chloride: 95 mmol/L — ABNORMAL LOW (ref 98–111)
Creatinine, Ser: 1.58 mg/dL — ABNORMAL HIGH (ref 0.44–1.00)
GFR, Estimated: 35 mL/min — ABNORMAL LOW (ref >60)
Glucose, Bld: 129 mg/dL — ABNORMAL HIGH (ref 70–99)
Potassium: 4.2 mmol/L (ref 3.5–5.1)
Sodium: 134 mmol/L — ABNORMAL LOW (ref 135–145)
Total Bilirubin: 0.5 mg/dL (ref <1.2)
Total Protein: 6.4 g/dL — ABNORMAL LOW (ref 6.5–8.1)

## 2023-01-22 LAB — TROPONIN I (HIGH SENSITIVITY)
Troponin I (High Sensitivity): 14 ng/L (ref <18)
Troponin I (High Sensitivity): 11 ng/L (ref <18)

## 2023-01-22 MED ORDER — LACTATED RINGERS IV BOLUS
1000.0000 mL | Freq: Once | INTRAVENOUS | Status: AC
  Administered 2023-01-22: 1000 mL via INTRAVENOUS

## 2023-01-22 MED ORDER — PROCHLORPERAZINE EDISYLATE 10 MG/2ML IJ SOLN
5.0000 mg | Freq: Once | INTRAMUSCULAR | Status: AC
  Administered 2023-01-22: 5 mg via INTRAVENOUS
  Filled 2023-01-22: qty 2

## 2023-01-22 NOTE — Discharge Instructions (Signed)
Please follow-up with your cardiologist as planned.  Please return for any worsening symptoms

## 2023-01-22 NOTE — ED Provider Notes (Signed)
[facility] Provider Note    Event Date/Time   First MD Initiated Contact with Patient 01/22/23 1747     (approximate)   History   Nausea   HPI Jodi Chavez is a 72 y.o. female with history of DM2, HTN, HLD, HFpEF, CKD stage III presenting today for nausea.  Patient states she was sitting in her chair when she felt like she had to have a bowel movement and had sensation of needing to bear down.  She then got a little lightheaded and had some nausea.  She denied any other symptoms such as chest pain, palpitations, shortness of breath, abdominal pain, vomiting, leg pain, leg swelling.  EMS was called and she was able to have a bowel movement with resolution of symptoms.  Took an oral Zofran at home before arrival.  Now in the ED, she is largely asymptomatic.  No hypotension noted with EMS.  Reviewed admission notes from earlier this year for syncopal episode which was largely found out to be likely orthostatic hypotension versus hypotension related to medication.  No other cardiac concerns.  Also recently seen by her cardiologist with cath report with multiple areas of stenosis but no intervention.  Plan for follow-up visit next week.  Intervention at that time was deferred until evaluation for complex multivessel intervention versus coronary bypass versus medical therapy.     Physical Exam   Triage Vital Signs: ED Triage Vitals  Encounter Vitals Group     BP 01/22/23 1749 111/62     Systolic BP Percentile --      Diastolic BP Percentile --      Pulse Rate 01/22/23 1749 69     Resp 01/22/23 1749 18     Temp 01/22/23 1749 97.9 F (36.6 C)     Temp Source 01/22/23 1749 Oral     SpO2 01/22/23 1749 97 %     Weight 01/22/23 1750 176 lb 5.9 oz (80 kg)     Height 01/22/23 1750 5\' 1"  (1.549 m)     Head Circumference --      Peak Flow --      Pain Score 01/22/23 1750 0     Pain Loc --      Pain Education --      Exclude from Growth Chart --     Most recent  vital signs: Vitals:   01/22/23 2000 01/22/23 2030  BP: (!) 122/58 (!) 122/58  Pulse: 70 68  Resp: 18 (!) 21  Temp:    SpO2: 92% 97%   Physical Exam: I have reviewed the vital signs and nursing notes. General: Awake, alert, no acute distress.  Nontoxic appearing. Head:  Atraumatic, normocephalic.   ENT:  EOM intact, PERRL. Oral mucosa is pink and moist with no lesions. Neck: Neck is supple with full range of motion, No meningeal signs. Cardiovascular:  RRR, No murmurs. Peripheral pulses palpable and equal bilaterally. Respiratory:  Symmetrical chest wall expansion.  No rhonchi, rales, or wheezes.  Good air movement throughout.  No use of accessory muscles.   Musculoskeletal:  No cyanosis or edema. Moving extremities with full ROM Abdomen:  Soft, nontender, nondistended. Neuro:  GCS 15, moving all four extremities, interacting appropriately. Speech clear. Psych:  Calm, appropriate.   Skin:  Warm, dry, no rash.    ED Results / Procedures / Treatments   Labs (all labs ordered are listed, but only abnormal results are displayed) Labs Reviewed  COMPREHENSIVE METABOLIC PANEL - Abnormal; Notable for  the following components:      Result Value   Sodium 134 (*)    Chloride 95 (*)    Glucose, Bld 129 (*)    BUN 34 (*)    Creatinine, Ser 1.58 (*)    Total Protein 6.4 (*)    GFR, Estimated 35 (*)    All other components within normal limits  CBC WITH DIFFERENTIAL/PLATELET  LIPASE, BLOOD  TROPONIN I (HIGH SENSITIVITY)  TROPONIN I (HIGH SENSITIVITY)     EKG My EKG interpretation: Rate of 62, normal sinus rhythm, normal axis.  No acute ST elevations or depressions   RADIOLOGY    PROCEDURES:  Critical Care performed: No  Procedures   MEDICATIONS ORDERED IN ED: Medications  prochlorperazine (COMPAZINE) injection 5 mg (5 mg Intravenous Given 01/22/23 1813)  lactated ringers bolus 1,000 mL (0 mLs Intravenous Stopped 01/22/23 2046)     IMPRESSION / MDM / ASSESSMENT AND  PLAN / ED COURSE  I reviewed the triage vital signs and the nursing notes.                              Differential diagnosis includes, but is not limited to, vasovagal syncope, orthostatic hypotension, less likely ACS  Patient's presentation is most consistent with acute complicated illness / injury requiring diagnostic workup.  Patient is a 72 year old female presenting today for lightheadedness and nausea.  Events occurred around the time of needing to have a bowel movement and bearing down.  Did not have true syncope and no associated symptoms such as chest pain, palpitations, shortness of breath.  Asymptomatic on arrival with stable vital signs.  Will evaluate with laboratory workup and EKG to rule out further cardiac pathology although it does seem more consistent with near vagal episode.  Troponins negative x 2.  EKG without signs of ischemia.  CMP with some mild evidence of dehydration with elevated BUN but otherwise the rest of her workup is reassuring.  Patient was asymptomatic and had no events on the cardiac monitor throughout her 3 hours in the ED.  I do feel she is safe for discharge given only brief episode of lightheadedness but otherwise asymptomatic.  She does have planned follow-up with her cardiologist next Monday which she will attend.  Given strict return precautions.  The patient is on the cardiac monitor to evaluate for evidence of arrhythmia and/or significant heart rate changes. Clinical Course as of 01/22/23 2059  Tue Jan 22, 2023  1850 Comprehensive metabolic panel(!) Mild dehydration but laboratory workup otherwise consistent with her baseline.  Will give some fluids here in the ED. [DW]  1850 Troponin I (High Sensitivity): 14 No chest pain throughout this event.  Troponin consistent with her baseline [DW]  2033 Troponin I (High Sensitivity): 11 Stable.  Will discharge with cardiology follow-up [DW]    Clinical Course User Index [DW] Janith Lima, MD      FINAL CLINICAL IMPRESSION(S) / ED DIAGNOSES   Final diagnoses:  Lightheadedness  Nausea     Rx / DC Orders   ED Discharge Orders     None        Note:  This document was prepared using Dragon voice recognition software and may include unintentional dictation errors.   Janith Lima, MD 01/22/23 2059

## 2023-01-22 NOTE — ED Triage Notes (Signed)
Pt presents to the ED via [facility] from home. Pt had sudden onset of nausea and dizziness at home. Denied any pain at this time. Pt did have a BM when EMS arrived. Pt reports continued nausea despite taking 4mg  PO zofran at home. Pt denies any pain or dizziness at this time

## 2023-01-28 ENCOUNTER — Telehealth: Payer: Self-pay | Admitting: Family

## 2023-01-28 ENCOUNTER — Other Ambulatory Visit: Payer: Self-pay | Admitting: Nurse Practitioner

## 2023-01-28 ENCOUNTER — Ambulatory Visit: Payer: Medicare Other | Admitting: Cardiology

## 2023-01-28 DIAGNOSIS — R931 Abnormal findings on diagnostic imaging of heart and coronary circulation: Secondary | ICD-10-CM

## 2023-01-28 DIAGNOSIS — I25118 Atherosclerotic heart disease of native coronary artery with other forms of angina pectoris: Secondary | ICD-10-CM

## 2023-01-28 NOTE — Telephone Encounter (Signed)
Left pt a vm to confirm appt 01/29/23

## 2023-01-29 ENCOUNTER — Encounter: Payer: Self-pay | Admitting: Family

## 2023-01-29 ENCOUNTER — Ambulatory Visit: Payer: Medicare Other | Attending: Family | Admitting: Family

## 2023-01-29 VITALS — BP 108/55 | HR 70 | Wt 183.0 lb

## 2023-01-29 DIAGNOSIS — E785 Hyperlipidemia, unspecified: Secondary | ICD-10-CM | POA: Diagnosis not present

## 2023-01-29 DIAGNOSIS — I2510 Atherosclerotic heart disease of native coronary artery without angina pectoris: Secondary | ICD-10-CM | POA: Diagnosis not present

## 2023-01-29 DIAGNOSIS — Z7984 Long term (current) use of oral hypoglycemic drugs: Secondary | ICD-10-CM | POA: Insufficient documentation

## 2023-01-29 DIAGNOSIS — I493 Ventricular premature depolarization: Secondary | ICD-10-CM | POA: Insufficient documentation

## 2023-01-29 DIAGNOSIS — Z79899 Other long term (current) drug therapy: Secondary | ICD-10-CM | POA: Diagnosis not present

## 2023-01-29 DIAGNOSIS — E1122 Type 2 diabetes mellitus with diabetic chronic kidney disease: Secondary | ICD-10-CM | POA: Diagnosis not present

## 2023-01-29 DIAGNOSIS — Z833 Family history of diabetes mellitus: Secondary | ICD-10-CM | POA: Insufficient documentation

## 2023-01-29 DIAGNOSIS — I10 Essential (primary) hypertension: Secondary | ICD-10-CM

## 2023-01-29 DIAGNOSIS — E1142 Type 2 diabetes mellitus with diabetic polyneuropathy: Secondary | ICD-10-CM | POA: Insufficient documentation

## 2023-01-29 DIAGNOSIS — I110 Hypertensive heart disease with heart failure: Secondary | ICD-10-CM | POA: Diagnosis not present

## 2023-01-29 DIAGNOSIS — I5032 Chronic diastolic (congestive) heart failure: Secondary | ICD-10-CM | POA: Insufficient documentation

## 2023-01-29 DIAGNOSIS — Z8249 Family history of ischemic heart disease and other diseases of the circulatory system: Secondary | ICD-10-CM | POA: Diagnosis not present

## 2023-01-29 DIAGNOSIS — N1832 Chronic kidney disease, stage 3b: Secondary | ICD-10-CM

## 2023-01-29 NOTE — Progress Notes (Signed)
Advanced Heart Failure Clinic Note    PCP: Fleet Contras, MD (last seen 09/24) Cardiologist: Dorothyann Peng, MD/ Shirley Muscat NP (last seen 11/24)  HPI:  Jodi Chavez is a 72 y/o female with a history of CAD, PVC, aortic atherosclerosis, HTN, moderate AS, depression, T2DM, neuropathy, hyperlipidemia, bilateral carotid stenosis and chronic heart failure. Holter monitor applied 08/24 and showed min rate 58, average 80, max 170. Rare bradycardia. Controlled heart rate 99% of time with < 1% tachycardia. PVC burden 11% with 99% of one morphology. <1% SVE burden. No significant sustained runs, pauses, high-grade blocks, ST segment changes. No e/o atiral fibrillation   Admitted 08/11/22 due to chest discomfort, shortness of breath, orthopnea and bilateral leg edema. Saturation dropped to 87% on room air and improved to 97% on 2 L/min oxygen. Troponins went from 1 62-234 and BNP was 1,096 on admission. IV diuresed. Elevated troponin thought to be due to demand ischemia.    Admitted 09/08/22 due to syncope. Has shortness of breath, no chest pain, cough. Patient has nausea, no vomiting, diarrhea or abdominal pain. Denies symptoms of UTI. She states that she passed out for approximately 2 minutes. EMS reports initial blood pressure was in 70s at the scene. She received 1 L LR bolus in ED. trop 28 --> 28, BNP 927, Positive D-dimer 1.38. CT head negative for acute intracranial abnormalities. CTA negative for PE. Lower extremity venous Doppler negative for DVT. Orthostatics show BP drops but HR does not increase. PRN torsemide at discharge. O2 @ 2L.   Was in the ED 11/21/22 due to a fall. Says that she fell forward and hit her face on the door. Denies LOC but did hit her head. Besides hitting her face, she also landed on her left knee.    Was in the ED 01/22/23 due to nausea and dizziness after having a sensation of needing to bear down. After she had a bowel movement, the symptoms resolved. Troponins  negative. EKG without signs of ischemia. IVF given for slight dehydration.   Echo 10/07/19: EF 50-55% with mild MR and normal PA pressure Echo 08/12/22: EF 50-55% with trivial MR  Stress test 03/11/20: LVEF= 60%  FINDINGS:  Regional wall motion:  reveals normal myocardial thickening and wall motion.  The overall quality of the study is good.   Artifacts noted: yes  Left ventricular cavity: normal.   CT Angio Chest PE w/ and w/o contrast 10/07/2019:  Cardiovascular: Heart size is normal. Extensive coronary artery  calcifications are present. Atherosclerotic changes are present in  the aorta and at the origins the great vessels without significant  stenosis. No aneurysm is present.   LHC 01/14/23:   Ost RCA to Prox RCA lesion is 75% stenosed.   Mid RCA-1 lesion is 95% stenosed.   Mid RCA-2 lesion is 75% stenosed.   RPDA-1 lesion is 100% stenosed.   RPDA-2 lesion is 100% stenosed.   Ost LAD to Prox LAD lesion is 90% stenosed.   Prox LAD to Mid LAD lesion is 100% stenosed.   Mid LAD lesion is 100% stenosed.   Prox RCA lesion is 25% stenosed.   There is moderate to severe left ventricular systolic dysfunction.   LV end diastolic pressure is moderately elevated.   The left ventricular ejection fraction is 25-35% by visual estimate.   Anticipated discharge date to be determined.   Recommend uninterrupted dual antiplatelet therapy with Aspirin 81mg  daily and Clopidogrel 75mg  daily for a minimum of 6 months (stable  ischemic heart disease-Class I recommendation).  Conclusion Moderate to severely depressed left ventricular function globally EF of around  30% Coronaries Left main large minor irregularities LAD large 90% diffuse ostial to proximal.  100% mid CTO Circumflex large minor irregularities RCA large 75% ostial, 95% mid, 75% mid, proximal PDA 100% CTO Right dominant system   Collaterals right to left   Collaterals left to left  She presents today for a HF follow-up visit with a  chief complaint of moderate fatigue with minimal exertion. Chronic in nature. Has associated shortness of breath, occasional chest pain/ palpitations and occasional dizziness/weakness along with this. Denies cough, abdominal distention, pedal edema or difficulty sleeping.   Currently taking torsemide 30mg  every day per cardiology. Has to have cMRI late January for possible upcoming CABG early next year.   ROS: All systems negative except as listed in HPI, PMH and Problem List.  SH:  Social History   Socioeconomic History   Marital status: Single    Spouse name: Not on file   Number of children: Not on file   Years of education: Not on file   Highest education level: Not on file  Occupational History   Not on file  Tobacco Use   Smoking status: Never   Smokeless tobacco: Never  Substance and Sexual Activity   Alcohol use: No   Drug use: No   Sexual activity: Never  Other Topics Concern   Not on file  Social History Narrative   Not on file   Social Determinants of Health   Financial Resource Strain: Not on file  Food Insecurity: No Food Insecurity (09/08/2022)   Hunger Vital Sign    Worried About Running Out of Food in the Last Year: Never true    Ran Out of Food in the Last Year: Never true  Transportation Needs: No Transportation Needs (09/08/2022)   PRAPARE - Administrator, Civil Service (Medical): No    Lack of Transportation (Non-Medical): No  Physical Activity: Not on file  Stress: Not on file  Social Connections: Not on file  Intimate Partner Violence: Not At Risk (09/08/2022)   Humiliation, Afraid, Rape, and Kick questionnaire    Fear of Current or Ex-Partner: No    Emotionally Abused: No    Physically Abused: No    Sexually Abused: No    FH:  Family History  Problem Relation Age of Onset   Cancer Mother    Stroke Mother    Diabetes Mother    Hypertension Mother    Colon cancer Mother 81       died 02/20/2006   Gout Father    Heart disease Father      Past Medical History:  Diagnosis Date   Acute respiratory failure with hypoxia (HCC) 05/27/2022   Arthritis    hip   CHF (congestive heart failure) (HCC)    Constipation    uses laxatives several times a week   Depression    Diabetes mellitus    takes Metformin and Amaryl daily   Dizziness    occasionally and related to meds    Early cataracts, bilateral    History of bronchitis    last time several yrs ago   History of migraine    many yrs ago   Hyperlipidemia    takes Zetia and Zocor daily   Hypertension    takes Benazepril nightly and Propranolol tid and Clonidine daily   Insomnia    Low back pain  Peripheral edema    Peripheral neuropathy    PONV (postoperative nausea and vomiting)    Slow urinary stream    occasionally    Current Outpatient Medications  Medication Sig Dispense Refill   acetaminophen (TYLENOL) 500 MG tablet Take 500-1,000 mg by mouth See admin instructions. Take 1000 mg in the morning 500 mg at lunch and 1000 mg at bedtime     ascorbic acid (VITAMIN C) 500 MG tablet Take 500 mg by mouth daily.     aspirin 81 MG EC tablet Take 81 mg by mouth daily.     benazepril (LOTENSIN) 20 MG tablet Take 20 mg by mouth daily.     Calcium Carbonate-Vitamin D (OYSTER SHELL CALCIUM/D PO) Take 2 tablets by mouth 2 (two) times daily. Citracal Max plus 1300 mg     empagliflozin (JARDIANCE) 10 MG TABS tablet Take 1 tablet (10 mg total) by mouth daily.     ezetimibe (ZETIA) 10 MG tablet Take 10 mg by mouth at bedtime.     fluticasone (FLONASE) 50 MCG/ACT nasal spray Place 2 sprays into both nostrils daily.     gabapentin (NEURONTIN) 300 MG capsule Take 300 mg by mouth 3 (three) times daily.     GARLIC PO Take 6,578 mg by mouth daily after breakfast. Blood pressure formula     GLUCOSAMINE-CHONDROITIN PO Take 1 tablet by mouth 2 (two) times daily. 1500 mg glucosamine 1200 mg Chondroitin     isosorbide mononitrate (IMDUR) 60 MG 24 hr tablet Take 60 mg by mouth daily.      loratadine (CLARITIN) 10 MG tablet Take 1 tablet (10 mg total) by mouth daily.     magnesium oxide (MAG-OX) 400 MG tablet Take 400 mg by mouth 2 (two) times daily.     metFORMIN (GLUCOPHAGE) 850 MG tablet Take 850 mg by mouth daily with breakfast.     metoprolol succinate (TOPROL-XL) 50 MG 24 hr tablet Take 50 mg by mouth daily.     Multiple Vitamin (MULITIVITAMIN WITH MINERALS) TABS Take 1 tablet by mouth daily after breakfast.      ondansetron (ZOFRAN) 4 MG tablet Take 1 tablet (4 mg total) by mouth every 8 (eight) hours as needed for nausea or vomiting. 30 tablet 0   oxyCODONE-acetaminophen (PERCOCET/ROXICET) 5-325 MG tablet Take 1 tablet by mouth every 6 (six) hours as needed for severe pain (pain score 7-10). (Patient not taking: Reported on 01/14/2023)     pantoprazole (PROTONIX) 40 MG tablet Take 1 tablet (40 mg total) by mouth daily. 30 tablet 2   polyethylene glycol (MIRALAX / GLYCOLAX) 17 g packet Take 17 g by mouth 2 (two) times daily. (Patient taking differently: Take 17 g by mouth every morning.) 30 each 0   simvastatin (ZOCOR) 40 MG tablet Take 40 mg by mouth every evening.     torsemide (DEMADEX) 20 MG tablet Take 1 tablet (20 mg total) by mouth daily as needed (for increased swelling or weight gain). (Patient taking differently: Take 30 mg by mouth every morning.)     traZODone (DESYREL) 50 MG tablet Take 25 mg by mouth at bedtime.     VENTOLIN HFA 108 (90 Base) MCG/ACT inhaler 1 puff every 4 (four) hours as needed for wheezing or shortness of breath.     zinc sulfate 220 (50 Zn) MG capsule Take 880 mg by mouth daily.     No current facility-administered medications for this visit.   Vitals:   01/29/23 1546  BP: (!) 108/55  Pulse: 70  SpO2: 96%  Weight: 183 lb (83 kg)   Wt Readings from Last 3 Encounters:  01/29/23 183 lb (83 kg)  01/22/23 176 lb 5.9 oz (80 kg)  01/14/23 176 lb 8 oz (80.1 kg)   Lab Results  Component Value Date   CREATININE 1.58 (H) 01/22/2023    CREATININE 1.57 (H) 01/14/2023   CREATININE 1.60 (H) 12/26/2022   CREATININE 1.60 (H) 12/26/2022    PHYSICAL EXAM:  General:  Well appearing. No resp difficulty HEENT: normal Neck: supple. JVP flat. No lymphadenopathy or thryomegaly appreciated. Cor: PMI normal. Regular rate & rhythm. No rubs, gallops or murmurs. Lungs: clear Abdomen: soft, nontender, nondistended. No hepatosplenomegaly. No bruits or masses. Good bowel sounds. Extremities: no cyanosis, clubbing, rash, edema Neuro: alert & orientedx3, cranial nerves grossly intact. Moves all 4 extremities w/o difficulty. Affect pleasant.   ECG: not done   ASSESSMENT & PLAN:  1: Chronic heart failure with preserved ejection fraction- - suspect due to HTN/ PVC's - NYHA class III - euvolemic - weighing daily; reviewed calling for overnight weight gain of > 2 pounds or a weekly weight gain of > 5 pounds - weight stable from last visit here 2 months ago - Echo 10/07/19: EF 50-55% with mild MR and normal PA pressure - Echo 08/12/22: EF 50-55% with trivial MR - continue benazepril 20mg  daily - continue jardiance 10mg  daily - continue metoprolol succinate 50mg  daily - continue torsemide 30mg  daily - consider spironolactone but will defer until after she sees EP since she's had syncopal events - wearing compression socks on bilateral lower legs - elevating legs when sitting for long periods of time - BNP 09/08/22 was 927.4  2: HTN- - BP 108/55 - saw PCP (Avbuere) 09/24 - BMP 01/28/23 showed sodium 139, potassium 4.4, creatinine 1.6 & GFR 34  3: DM- - A1c 08/11/22 was 6.4% - continue metformin 850mg  daily  4: CAD- - saw cardiology Ann Maki) 11/24 - continue zetia 10mg  daily - continue simvastatin 40mg  daily - LDL 08/11/22 was 33 - Stress test 03/11/20:   LVEF= 60%    FINDINGS:    Regional wall motion:  reveals normal myocardial thickening and wall motion.    The overall quality of the study is good.     Artifacts noted: yes     Left ventricular cavity: normal.  - LHC 01/14/23:   Ost RCA to Prox RCA lesion is 75% stenosed.   Mid RCA-1 lesion is 95% stenosed.   Mid RCA-2 lesion is 75% stenosed.   RPDA-1 lesion is 100% stenosed.   RPDA-2 lesion is 100% stenosed.   Ost LAD to Prox LAD lesion is 90% stenosed.   Prox LAD to Mid LAD lesion is 100% stenosed.   Mid LAD lesion is 100% stenosed.   Prox RCA lesion is 25% stenosed.   There is moderate to severe left ventricular systolic dysfunction.   LV end diastolic pressure is moderately elevated.   The left ventricular ejection fraction is 25-35% by visual estimate.   Anticipated discharge date to be determined.   Recommend uninterrupted dual antiplatelet therapy with Aspirin 81mg  daily and Clopidogrel 75mg  daily for a minimum of 6 months (stable ischemic heart disease-Class I recommendation).  Conclusion Moderate to severely depressed left ventricular function globally EF of around  30% Coronaries Left main large minor irregularities LAD large 90% diffuse ostial to proximal.  100% mid CTO Circumflex large minor irregularities RCA large 75% ostial, 95% mid, 75% mid, proximal PDA 100% CTO  Right dominant system   Collaterals right to left   Collaterals left to left  5: PVC's- - to see EP Lalla Brothers) 01/25 - Holter monitor applied 08/24 and showed min rate 58, average 80, max 170. Rare bradycardia. Controlled heart rate 99% of time with < 1% tachycardia. PVC burden 11% with 99% of one morphology. <1% SVE burden. No significant sustained runs, pauses, high-grade blocks, ST segment changes. No e/o atiral fibrillation  - continue magnesium 400mg  BID   Has upcoming cMRI late January for possible CABG early next year. Due to this, will not schedule a followup appt at this time. Advised to follow closely with cardiology but that she could call back at anytime for questions or for another appt if needed. She was comfortable with this plan.

## 2023-01-29 NOTE — Patient Instructions (Signed)
 Call us in the future if you need [place] for anything.   If you receive a satisfaction survey regarding the [facility] Failure Clinic, please take the time to fill it out. This way we can continue to provide excellent care and make any changes that need to be made.

## 2023-02-14 IMAGING — MG MM DIGITAL SCREENING BILAT W/ TOMO AND CAD
8 series · 8 of 24 positions shown · non-contrast
Comparison: Previous exam(s).

CLINICAL DATA: Screening.

EXAM:
DIGITAL SCREENING BILATERAL MAMMOGRAM WITH TOMOSYNTHESIS AND CAD
TECHNIQUE: Bilateral screening digital craniocaudal and mediolateral oblique
mammograms were obtained. Bilateral screening digital breast
tomosynthesis was performed. The images were evaluated with
computer-aided detection.

[L MLO synth-2D]
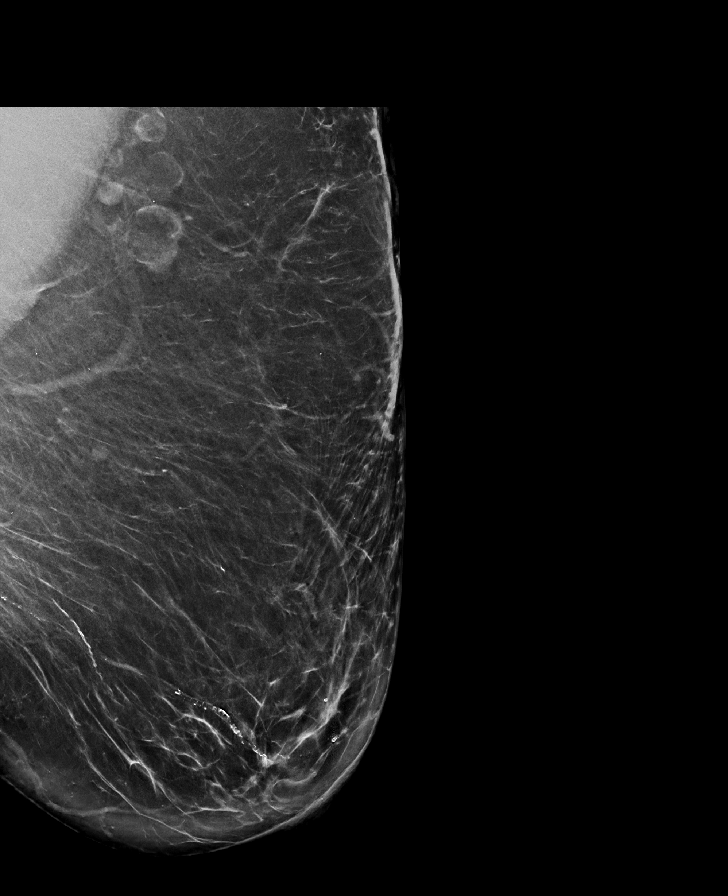

[L CC synth-2D]
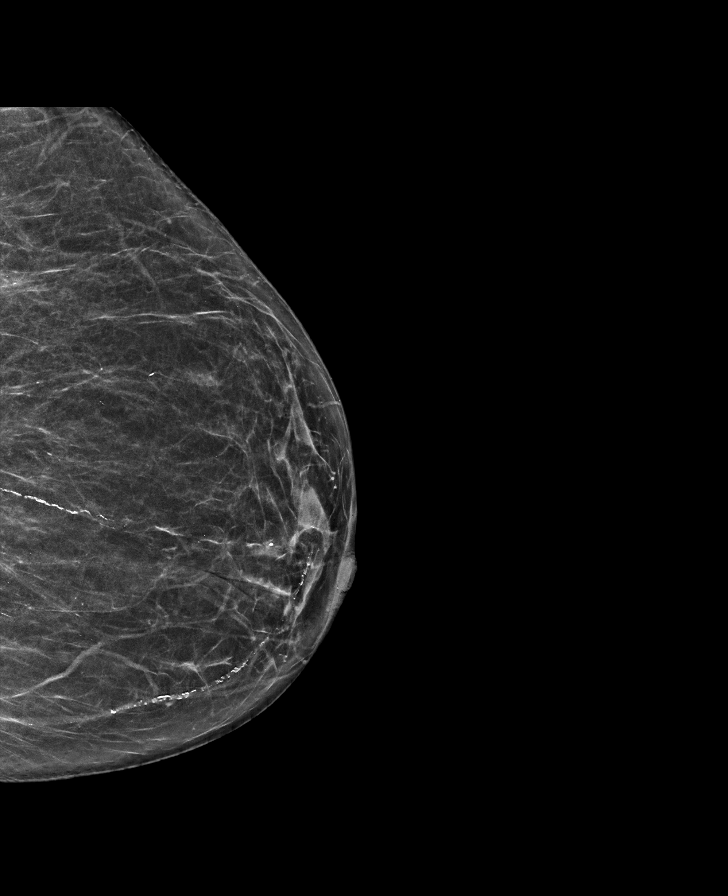

[R MLO synth-2D]
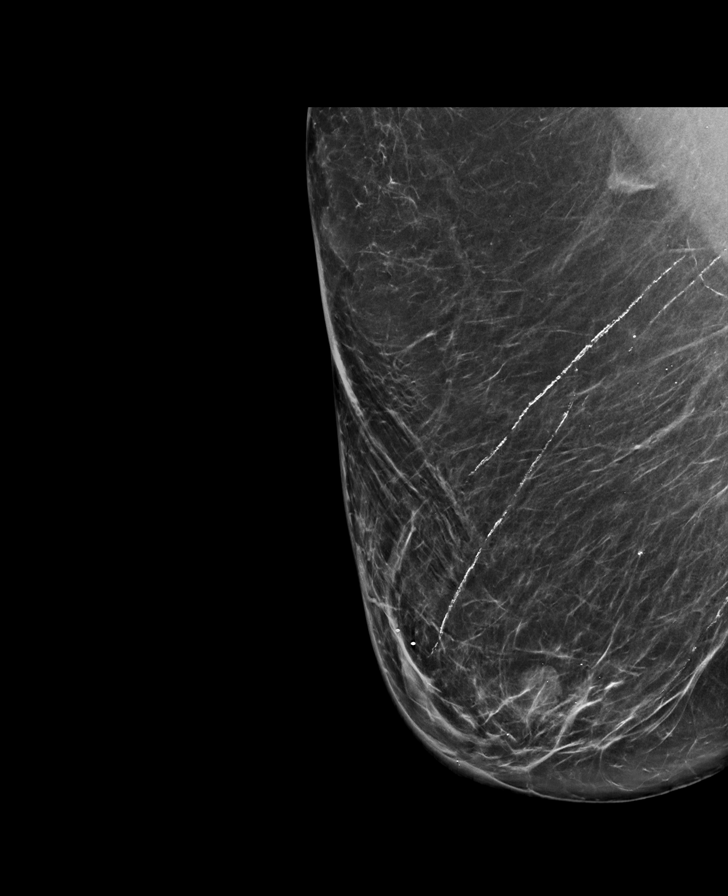

[R CC synth-2D]
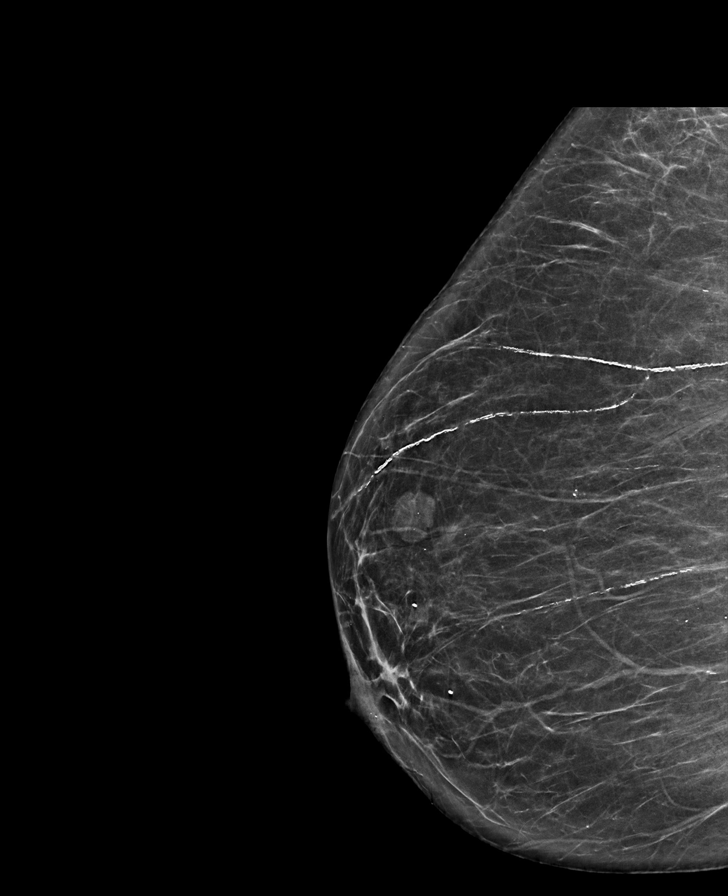

[L MLO tomo · tomo slice 44/87.0]
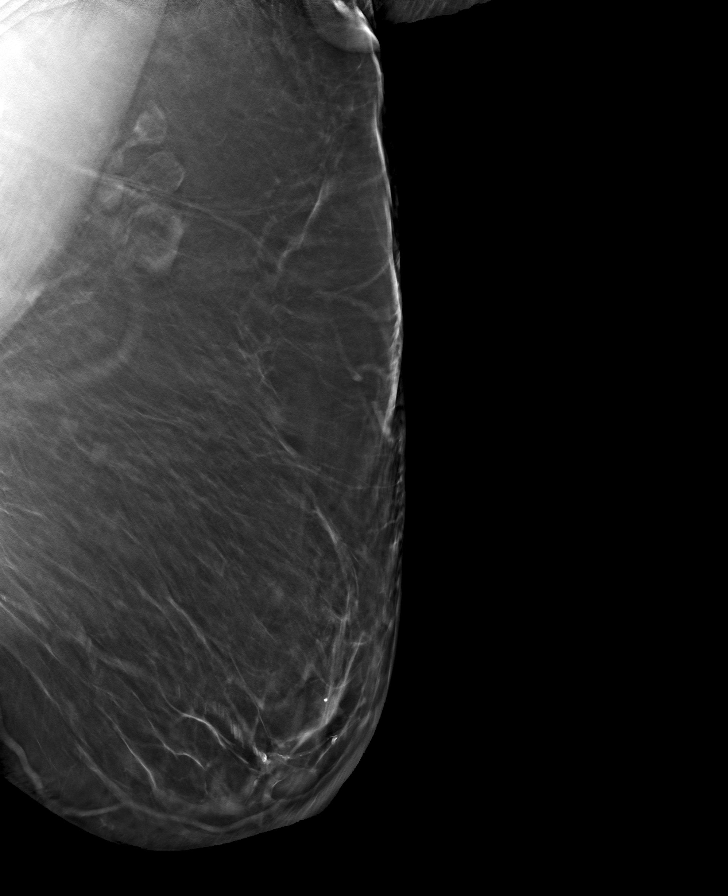

[L CC tomo · tomo slice 31/61.0]
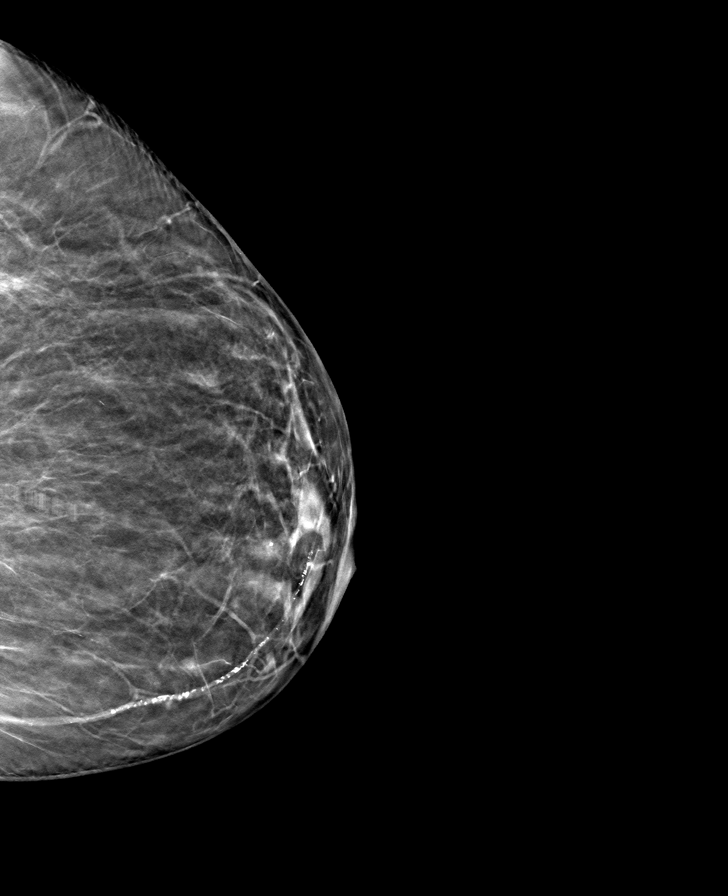

[R CC tomo · tomo slice 31/62.0]
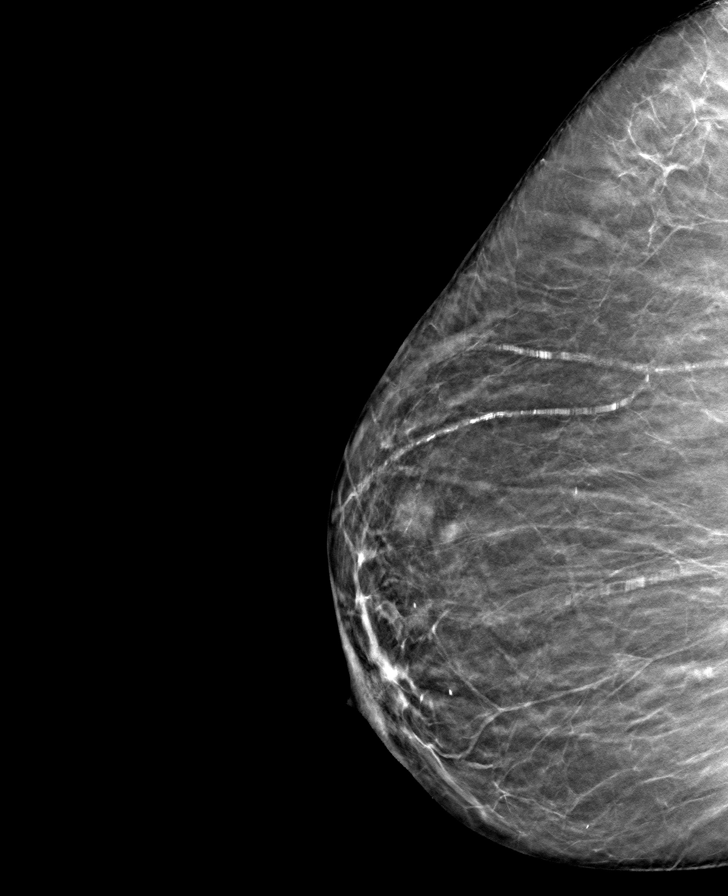

[R MLO tomo · tomo slice 39/76.0]
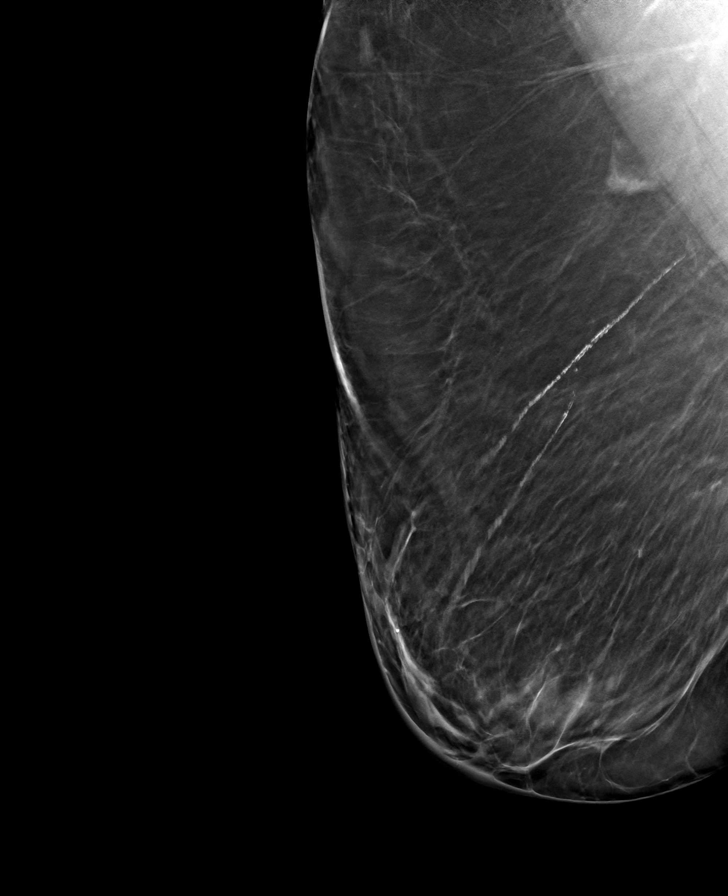

[8 of 24 positions shown; findings below may reference images not displayed]

ACR Breast Density Category b: There are scattered areas of
fibroglandular density.
FINDINGS: There are no findings suspicious for malignancy.
IMPRESSION: No mammographic evidence of malignancy. A result letter of this
screening mammogram will be mailed directly to the patient.

RECOMMENDATION:
Screening mammogram in one year. (Code:51-O-LD2)

BI-RADS CATEGORY  1: Negative.

## 2023-03-06 ENCOUNTER — Institutional Professional Consult (permissible substitution): Payer: Medicare Other | Admitting: Cardiology

## 2023-03-10 NOTE — Progress Notes (Signed)
  Electrophysiology Office Note:    Date:  03/11/2023   ID:  Jodi Chavez, DOB 1950-05-24, MRN [id number]  [facility] Cardiologist:  None  [facility] Electrophysiologist:  Jodi ONEIDA HOLTS, MD   Referring MD: Jodi Atlas, MD   Chief Complaint: PVC's  History of Present Illness:    Jodi Chavez is a 73yo woman who I am seeing today for an evaluation of PVC's at the request of Jodi Chavez. The patient has a history of CAD, PVCs, aortic athero, HTN, moderate AS, depression, DM, HLD, carotid stenosis, chronic diastolic heart failure. The patient last saw Jodi 01/29/2023.   Prior LHC showed severe multivessel CAD and she was referred for CABG consideration.   A cardiac MRI was ordered but has not been completed.  Today she reports intermittent chest pain.  Some shortness of breath with exertion that limits her mobility.  She typically uses a wheelchair for longer distances.     Their past medical, social and family history was reviewed.   ROS:   Please see the history of present illness.    All other systems reviewed and are negative.  EKGs/Labs/Other Studies Reviewed:    The following studies were reviewed today:  01/14/2023 LHC personally reviewed Severe multivessel CAD.  EF25%  01/23/2023 ECG shows sinus. No pVCS. EKG Interpretation Date/Time:  Monday March 11 2023 11:51:31 EST Ventricular Rate:  71 PR Interval:  186 QRS Duration:  106 QT Interval:  394 QTC Calculation: 428 R Axis:   111  Text Interpretation: Normal sinus rhythm Incomplete left bundle branch block Left posterior fascicular block Confirmed by Chavez Jodi [id number]) on 03/11/2023 11:55:56 AM    Physical Exam:    VS:  BP (!) 140/70 (BP Location: Left Arm, Patient Position: Sitting, Cuff Size: Large)   Pulse 71   Ht 5' 1 (1.549 m)   Wt 180 lb (81.6 kg)   SpO2 93%   BMI 34.01 kg/m     Wt Readings from Last 3 Encounters:  03/11/23 180 lb (81.6 kg)  01/29/23 183 lb (83 kg)  01/22/23  176 lb 5.9 oz (80 kg)     GEN: no distress.  Elderly.  Using a rollator. CARD: RRR, No MRG RESP: No IWOB. CTAB.        ASSESSMENT AND PLAN:    1. PVC (premature ventricular contraction)   2. Coronary artery disease involving native coronary artery of native heart without angina pectoris   3. Chronic systolic heart failure (HCC)      #Syncope #PVCs #Severe CAD #HFrEF The patient has a history of syncope in the setting of severe CAD and PVC's.  Her last syncopal episode was in July 2024.  Her EF is also low on a recent LHC ventriculogram. While her syncope appears to be related to orthostatic hypotension, she is still high risk given her severe coronary artery disease.   I have discussed her clinical situation and severity of her coronary artery disease. I will see her back after the cMR +/- CTS eval to review course and results. ICD will need to be considered depending on her clinical progression and cMR results.  Follow up 6 weeks with me.    Signed, Jodi ONEIDA. Holts, MD, [facility], [facility] 03/11/2023 11:56 AM    Electrophysiology [facility]

## 2023-03-11 ENCOUNTER — Ambulatory Visit: Payer: Medicare Other | Attending: Cardiology | Admitting: Cardiology

## 2023-03-11 ENCOUNTER — Encounter: Payer: Self-pay | Admitting: Cardiology

## 2023-03-11 VITALS — BP 140/70 | HR 71 | Ht 61.0 in | Wt 180.0 lb

## 2023-03-11 DIAGNOSIS — I2510 Atherosclerotic heart disease of native coronary artery without angina pectoris: Secondary | ICD-10-CM | POA: Diagnosis present

## 2023-03-11 DIAGNOSIS — I5022 Chronic systolic (congestive) heart failure: Secondary | ICD-10-CM | POA: Insufficient documentation

## 2023-03-11 DIAGNOSIS — I493 Ventricular premature depolarization: Secondary | ICD-10-CM | POA: Insufficient documentation

## 2023-03-11 NOTE — Patient Instructions (Signed)
Medication Instructions:  Your physician recommends that you continue on your current medications as directed. Please refer to the Current Medication list given to you today.  *If you need a refill on your cardiac medications before your next appointment, please call your pharmacy*  Follow-Up: At [facility], you and your health needs are our priority.  As part of our continuing mission to provide you with exceptional heart care, we have created designated Provider Care Teams.  These Care Teams include your primary Cardiologist (physician) and Advanced Practice Providers (APPs -  Physician Assistants and Nurse Practitioners) who all work together to provide you with the care you need, when you need it.  Your next appointment:   6 weeks  Provider:   Steffanie Dunn, MD

## 2023-03-18 ENCOUNTER — Other Ambulatory Visit
Admission: RE | Admit: 2023-03-18 | Discharge: 2023-03-18 | Disposition: A | Discharge status: Home / Self Care | Payer: Medicare Other | Source: Ambulatory Visit | Attending: Nurse Practitioner | Admitting: Nurse Practitioner

## 2023-03-18 DIAGNOSIS — I5033 Acute on chronic diastolic (congestive) heart failure: Secondary | ICD-10-CM | POA: Diagnosis present

## 2023-03-18 LAB — BRAIN NATRIURETIC PEPTIDE: B Natriuretic Peptide: 657.8 pg/mL — ABNORMAL HIGH (ref 0.0–100.0)

## 2023-03-26 ENCOUNTER — Telehealth (HOSPITAL_COMMUNITY): Payer: Self-pay | Admitting: *Deleted

## 2023-03-26 NOTE — Telephone Encounter (Signed)
Reaching out to patient to offer assistance regarding upcoming cardiac imaging study; pt verbalizes understanding of appt date/time, parking situation and where to check in, and verified current allergies; name and call back number provided for further questions should they arise  Larey Brick RN Navigator Cardiac Imaging [facility] [phone] office [phone] cell  Patient denies claustrophobia or metal.

## 2023-03-27 ENCOUNTER — Ambulatory Visit
Admission: RE | Admit: 2023-03-27 | Discharge: 2023-03-27 | Disposition: A | Discharge status: Home / Self Care | Payer: Medicare Other | Source: Ambulatory Visit | Attending: Nurse Practitioner | Admitting: Nurse Practitioner

## 2023-03-27 ENCOUNTER — Institutional Professional Consult (permissible substitution): Payer: Medicare Other | Admitting: Cardiology

## 2023-03-27 ENCOUNTER — Other Ambulatory Visit: Payer: Self-pay | Admitting: Nurse Practitioner

## 2023-03-27 DIAGNOSIS — I2510 Atherosclerotic heart disease of native coronary artery without angina pectoris: Secondary | ICD-10-CM

## 2023-03-27 DIAGNOSIS — I25118 Atherosclerotic heart disease of native coronary artery with other forms of angina pectoris: Secondary | ICD-10-CM

## 2023-03-27 DIAGNOSIS — R931 Abnormal findings on diagnostic imaging of heart and coronary circulation: Secondary | ICD-10-CM | POA: Diagnosis present

## 2023-03-27 MED ORDER — GADOBUTROL 1 MMOL/ML IV SOLN
12.0000 mL | Freq: Once | INTRAVENOUS | Status: AC | PRN
  Administered 2023-03-27: 12 mL via INTRAVENOUS

## 2023-04-19 ENCOUNTER — Other Ambulatory Visit: Payer: Self-pay | Admitting: Thoracic Surgery (Cardiothoracic Vascular Surgery)

## 2023-04-19 ENCOUNTER — Institutional Professional Consult (permissible substitution) (INDEPENDENT_AMBULATORY_CARE_PROVIDER_SITE_OTHER): Payer: Medicare Other | Admitting: Thoracic Surgery (Cardiothoracic Vascular Surgery)

## 2023-04-19 ENCOUNTER — Encounter: Payer: Self-pay | Admitting: Thoracic Surgery (Cardiothoracic Vascular Surgery)

## 2023-04-19 VITALS — BP 131/58 | HR 70 | Resp 20 | Ht 61.0 in | Wt 180.0 lb

## 2023-04-19 DIAGNOSIS — I25118 Atherosclerotic heart disease of native coronary artery with other forms of angina pectoris: Secondary | ICD-10-CM | POA: Diagnosis not present

## 2023-04-19 DIAGNOSIS — I350 Nonrheumatic aortic (valve) stenosis: Secondary | ICD-10-CM | POA: Diagnosis not present

## 2023-04-19 DIAGNOSIS — I2510 Atherosclerotic heart disease of native coronary artery without angina pectoris: Secondary | ICD-10-CM

## 2023-04-19 NOTE — Progress Notes (Signed)
 [address]       [place] [address]             [phone]        Jodi Chavez [facility] Medical Record #[id number] Date of Birth: [birth year]  Referring: Alwyn Pea, MD Primary Care: Fleet Contras, MD Primary Cardiologist:None  Chief Complaint:    Chief Complaint  Patient presents with   Coronary Artery Disease    New patient consultation, CATH 01/14/23, ECHO 08/12/22, 12/19/22, MR Cardiac 03/27/23,     History of Present Illness:     Jodi Chavez 73 y.o. female presents for surgical evaluation of three-vessel disease, and moderate aortic stenosis.  She does have a history of congestive heart failure, and history of exertional shortness of breath, chest pain, and some dizziness.  She currently uses a walker and has been doing so since 2016.  She states that she needs bilateral hip replacements.  She presents today in a wheelchair.    Past Medical and Surgical History: Previous Chest Surgery: No Previous Chest Radiation: No Diabetes Mellitus: Yes.  HbA1C 6.4 Creatinine:  Lab Results  Component Value Date   CREATININE 1.58 (H) 01/22/2023   CREATININE 1.57 (H) 01/14/2023   CREATININE 1.60 (H) 12/26/2022   CREATININE 1.60 (H) 12/26/2022     Past Medical History:  Diagnosis Date   Acute respiratory failure with hypoxia (HCC) 05/27/2022   Arthritis    hip   CHF (congestive heart failure) (HCC)    Constipation    uses laxatives several times a week   Depression    Diabetes mellitus    takes Metformin and Amaryl daily   Dizziness    occasionally and related to meds    Early cataracts, bilateral    History of bronchitis    last time several yrs ago   History of migraine    many yrs ago   Hyperlipidemia    takes Zetia and Zocor daily   Hypertension    takes Benazepril nightly and Propranolol tid and Clonidine daily   Insomnia    Low back pain    Peripheral edema    Peripheral neuropathy    PONV (postoperative nausea and  vomiting)    Slow urinary stream    occasionally    Past Surgical History:  Procedure Laterality Date   COLONOSCOPY     d&c/hysteroscopy/ablation     DILATION AND CURETTAGE OF UTERUS     couple of times   ESOPHAGOGASTRODUODENOSCOPY     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   LEFT HEART CATH AND CORONARY ANGIOGRAPHY Left 01/14/2023   Procedure: LEFT HEART CATH AND CORONARY ANGIOGRAPHY;  Surgeon: Alwyn Pea, MD;  Location: [facility] INVASIVE CV LAB;  Service: Cardiovascular;  Laterality: Left;   POSTERIOR CERVICAL FUSION/FORAMINOTOMY  10/15/2011   Procedure: POSTERIOR CERVICAL FUSION/FORAMINOTOMY LEVEL 2;  Surgeon: Kerrin Champagne, MD;  Location: [facility] OR;  Service: Orthopedics;  Laterality: N/A;  Right C6-7, C7-T1 Foraminotomy with excision HNP C7-T1   UPPER GASTROINTESTINAL ENDOSCOPY      Social History:  Social History   Tobacco Use  Smoking Status Never  Smokeless Tobacco Never    Social History   Substance and Sexual Activity  Alcohol Use No     Allergies  Allergen Reactions   Naproxen Sodium Rash    anaprox      Current Outpatient Medications  Medication Sig Dispense Refill   acetaminophen (TYLENOL) 500  MG tablet Take 500-1,000 mg by mouth See admin instructions. Take 1000 mg in the morning 500 mg at lunch and 1000 mg at bedtime     ascorbic acid (VITAMIN C) 500 MG tablet Take 500 mg by mouth daily.     aspirin 81 MG EC tablet Take 81 mg by mouth daily.     Calcium Carbonate-Vitamin D (OYSTER SHELL CALCIUM/D PO) Take 2 tablets by mouth 2 (two) times daily. Citracal Max plus 1300 mg     empagliflozin (JARDIANCE) 10 MG TABS tablet Take 1 tablet (10 mg total) by mouth daily.     ezetimibe (ZETIA) 10 MG tablet Take 10 mg by mouth at bedtime.     fluticasone (FLONASE) 50 MCG/ACT nasal spray Place 2 sprays into both nostrils daily.     gabapentin (NEURONTIN) 300 MG capsule Take 300 mg by mouth 3 (three) times daily.     GARLIC PO Take 1,610 mg by  mouth daily after breakfast. Blood pressure formula     GLUCOSAMINE-CHONDROITIN PO Take 1 tablet by mouth 2 (two) times daily. 1500 mg glucosamine 1200 mg Chondroitin     isosorbide mononitrate (IMDUR) 60 MG 24 hr tablet Take 60 mg by mouth daily.     loratadine (CLARITIN) 10 MG tablet Take 1 tablet (10 mg total) by mouth daily.     magnesium oxide (MAG-OX) 400 MG tablet Take 400 mg by mouth 2 (two) times daily.     metFORMIN (GLUCOPHAGE) 850 MG tablet Take 850 mg by mouth daily with breakfast.     metoprolol succinate (TOPROL-XL) 50 MG 24 hr tablet Take 100 mg by mouth daily.     Multiple Vitamin (MULITIVITAMIN WITH MINERALS) TABS Take 1 tablet by mouth daily after breakfast.      ondansetron (ZOFRAN) 4 MG tablet Take 1 tablet (4 mg total) by mouth every 8 (eight) hours as needed for nausea or vomiting. 30 tablet 0   oxyCODONE-acetaminophen (PERCOCET/ROXICET) 5-325 MG tablet Take 1 tablet by mouth every 6 (six) hours as needed for severe pain (pain score 7-10).     pantoprazole (PROTONIX) 40 MG tablet Take 1 tablet (40 mg total) by mouth daily. 30 tablet 2   polyethylene glycol (MIRALAX / GLYCOLAX) 17 g packet Take 17 g by mouth 2 (two) times daily. (Patient taking differently: Take 17 g by mouth every morning.) 30 each 0   simvastatin (ZOCOR) 40 MG tablet Take 40 mg by mouth every evening.     torsemide (DEMADEX) 20 MG tablet Take 1 tablet (20 mg total) by mouth daily as needed (for increased swelling or weight gain). (Patient taking differently: Take 30 mg by mouth every morning.)     traZODone (DESYREL) 50 MG tablet Take 25 mg by mouth at bedtime.     VENTOLIN HFA 108 (90 Base) MCG/ACT inhaler 1 puff every 4 (four) hours as needed for wheezing or shortness of breath.     zinc sulfate 220 (50 Zn) MG capsule Take 250 mg by mouth daily.     No current facility-administered medications for this visit.    (Not in a hospital admission)   Family History  Problem Relation Age of Onset   Cancer  Mother    Stroke Mother    Diabetes Mother    Hypertension Mother    Colon cancer Mother 5       died 05/08/2005   Gout Father    Heart disease Father      Review of Systems:   Review  of Systems  Constitutional:  Positive for malaise/fatigue.  Respiratory:  Positive for shortness of breath.   Cardiovascular:  Positive for chest pain.  Neurological:  Positive for dizziness.      Physical Exam: BP (!) 131/58 (BP Location: Left Arm, Patient Position: Sitting, Cuff Size: Normal)   Pulse 70   Resp 20   Ht 5\' 1"  (1.549 m)   Wt 180 lb (81.6 kg)   SpO2 95% Comment: RA  BMI 34.01 kg/m  Physical Exam Constitutional:      Comments: Frail  HENT:     Head: Normocephalic and atraumatic.  Eyes:     Extraocular Movements: Extraocular movements intact.  Cardiovascular:     Rate and Rhythm: Normal rate.  Abdominal:     General: Abdomen is flat. There is no distension.  Musculoskeletal:        General: Normal range of motion.     Cervical back: Normal range of motion.  Skin:    General: Skin is warm and dry.  Neurological:     General: No focal deficit present.     Mental Status: She is alert and oriented to person, place, and time.       Diagnostic Studies & Laboratory data:    Left Heart Catherization:  Intervention Echo: Pending EKG: Sinus I have independently reviewed the above radiologic studies and discussed with the patient   Recent Lab Findings: Lab Results  Component Value Date   WBC 6.4 01/22/2023   HGB 12.6 01/22/2023   HCT 37.9 01/22/2023   PLT 170 01/22/2023   GLUCOSE 129 (H) 01/22/2023   CHOL 102 08/11/2022   TRIG 186 (H) 08/11/2022   HDL 32 (L) 08/11/2022   LDLCALC 33 08/11/2022   ALT 17 01/22/2023   AST 27 01/22/2023   NA 134 (L) 01/22/2023   K 4.2 01/22/2023   CL 95 (L) 01/22/2023   CREATININE 1.58 (H) 01/22/2023   BUN 34 (H) 01/22/2023   CO2 28 01/22/2023   TSH 2.39 03/26/2022   INR 1.0 08/11/2022   HGBA1C 6.4 (H) 08/11/2022       Assessment / Plan:   73 year old female who presents with two-vessel disease and moderate aortic stenosis.  Her last echo was done in June of last year, but her most recent left heart catheterization shows an EF that is severely reduced.  Additionally she does not appear to have a good target in her LAD.  From a functional standpoint she is very limited, and likely would not tolerate a sternotomy.  I would like to discuss her case in cath conference to determine if she is a PCI, and TAVR candidate.  She will also need a new echocardiogram.   I  spent 40 minutes counseling the patient face to face.   Jodi Chavez 04/19/2023 12:16 PM

## 2023-04-22 ENCOUNTER — Ambulatory Visit (INDEPENDENT_AMBULATORY_CARE_PROVIDER_SITE_OTHER): Payer: Medicare Other | Admitting: Podiatry

## 2023-04-22 ENCOUNTER — Encounter: Payer: Self-pay | Admitting: Podiatry

## 2023-04-22 DIAGNOSIS — M79674 Pain in right toe(s): Secondary | ICD-10-CM | POA: Diagnosis not present

## 2023-04-22 DIAGNOSIS — E1140 Type 2 diabetes mellitus with diabetic neuropathy, unspecified: Secondary | ICD-10-CM | POA: Diagnosis not present

## 2023-04-22 DIAGNOSIS — M79675 Pain in left toe(s): Secondary | ICD-10-CM

## 2023-04-22 DIAGNOSIS — B351 Tinea unguium: Secondary | ICD-10-CM | POA: Diagnosis not present

## 2023-04-22 DIAGNOSIS — E1149 Type 2 diabetes mellitus with other diabetic neurological complication: Secondary | ICD-10-CM

## 2023-04-22 NOTE — Progress Notes (Signed)
 This patient returns to my office for at risk foot care.  This patient requires this care by a professional since this patient will be at risk due to having diabetes.    This patient is unable to cut nails herself since the patient cannot reach her nails.These nails are painful walking and wearing shoes.  She presents to the office wearing compression socks. The redness has resolved. This patient presents for at risk foot care today.  General Appearance  Alert, conversant and in no acute stress.  Vascular  Dorsalis pedis and posterior tibial  pulses are not  palpable due to swelling bilaterally.  Capillary return is within normal limits  bilaterally. Temperature is within normal limits  bilaterally.  Neurologic  Senn-Weinstein monofilament wire test within normal limits  bilaterally. Muscle power within normal limits bilaterally.  Nails Thick disfigured discolored nails with subungual debris  3-5 left and 2-5 right.. No evidence of bacterial infection or drainage bilaterally.  Orthopedic  No limitations of motion  feet .  No crepitus or effusions noted.  No bony pathology or digital deformities noted.  HAV  B/L. IPJ fusion left foot.  Skin  normotropic skin with no porokeratosis noted bilaterally.  No signs of infections or ulcers noted.   Callus sub 1st MPJ right foot.  Onychomycosis  Pain in right toes  Pain in left toes  Callus sub 1 right foot asymptomatic. Arthritis left hallux.  Consent was obtained for treatment procedures.   Mechanical debridement of nails 1-5  bilaterally performed with a nail nipper.  Filed with dremel without incident.    Return office visit   3 months                   Told patient to return for periodic foot care and evaluation due to potential at risk complications.   Helane Gunther DPM

## 2023-04-23 ENCOUNTER — Ambulatory Visit: Payer: Self-pay | Admitting: Emergency Medicine

## 2023-04-23 NOTE — Heart Team MDD (Signed)
   Heart Team Multi-Disciplinary Discussion  Patient: Jodi Chavez  DOB: [birth year]  MRN: [id number]   Date: 04/23/2023  9:14 AM    Attendees: Interventional Cardiology: Shawnie Pons, MD Lorine Bears, MD Yvonne Kendall, MD Bryan Lemma, MD Yates Decamp, MD Tonny Bollman, MD Dorothyann Peng, MD Peter [place], MD Truett Mainland, MD      Patient History: 73 y.o. female with three-vessel disease, and moderate aortic stenosis. Additionally, with hx of congestive heart failure, and history of exertional shortness of breath, chest pain, and some dizziness.   Risk Factors: Hypertension Diabetes Mellitus Hyperlipidemia Additional Risk Factors: HF, moderate aortic stenosis, bilat carotid stenosis.     Review of Prior Angiography and PCI Procedures: Left heart cath and coronary angiography images from 01/14/2023 were reviewed and discussed in detail including: Left main large minor irregularities, LAD large 90% diffuse ostial to proximal. 100% mid CTO of LAD. Circumflex large minor irregularities. RCA large 75% ostial, 95% mid, 75% mid, proximal PDA 100% CTO. Right dominant system.     Discussion: After presentation, consideration of treatment options occurred. Per recent CT surgery visit, pt is not a good surgical candidate as she does not appear to have a good target in her LAD. Moreover, she is limited from a functional standpoint currently using a wheelchair and would likely not tolerate a sternotomy. Discussion surrounded that she is not having debilitating angina at this time, but rather complains of more shortness of breath than chest pain. Consensus from the team was for a PCI of the RCA using atherectomy to combat calcifications within the artery.    Recommendations: PCI      Alison Murray, RN  04/23/2023 9:14 AM

## 2023-04-24 ENCOUNTER — Ambulatory Visit (HOSPITAL_COMMUNITY): Payer: Medicare Other | Attending: Thoracic Surgery (Cardiothoracic Vascular Surgery)

## 2023-04-24 DIAGNOSIS — I2510 Atherosclerotic heart disease of native coronary artery without angina pectoris: Secondary | ICD-10-CM | POA: Diagnosis present

## 2023-04-24 LAB — ECHOCARDIOGRAM COMPLETE
AR max vel: 1.34 cm2
AV Area VTI: 1.23 cm2
AV Area mean vel: 1.25 cm2
AV Mean grad: 6 mmHg
AV Peak grad: 10.9 mmHg
Ao pk vel: 1.65 m/s
Area-P 1/2: 3.72 cm2
Calc EF: 38.4 %
MV M vel: 4.77 m/s
MV Peak grad: 91 mmHg
Radius: 0.46 cm
S' Lateral: 4.05 cm
Single Plane A2C EF: 36.2 %
Single Plane A4C EF: 39.9 %

## 2023-04-24 MED ORDER — PERFLUTREN LIPID MICROSPHERE
1.0000 mL | INTRAVENOUS | Status: AC | PRN
  Administered 2023-04-24: 3 mL via INTRAVENOUS

## 2023-04-25 ENCOUNTER — Telehealth: Payer: Self-pay | Admitting: *Deleted

## 2023-04-25 NOTE — Telephone Encounter (Signed)
 Left the patient a message to call back to add her to Dr. Jari Sportsman schedule tomorrow at 10:20

## 2023-04-26 ENCOUNTER — Telehealth: Payer: Medicare Other | Admitting: Thoracic Surgery (Cardiothoracic Vascular Surgery)

## 2023-04-28 NOTE — Progress Notes (Unsigned)
  Electrophysiology Office Follow up Visit Note:    Date:  04/29/2023   ID:  KEBRINA FRIEND, DOB 1950-03-08, MRN [id number]  PCP:  Fleet Contras, MD  [facility] Cardiologist:  None  [facility] Electrophysiologist:  Lanier Prude, MD    Interval History:     Jodi Chavez is a 73 y.o. female who presents for a follow up visit.    I last saw the patient March 11, 2023 for PVCs and syncope.  She had a recent left heart catheterization which showed severe coronary artery disease.  She was still waiting to see cardiothoracic surgery at the time of her last appointment.  She also had a cardiac MRI pending.  She saw Dr. Cliffton Asters on April 19, 2023.  He felt like there was not a good target on her LAD to graft.  Her case was discussed at the heart team meeting on February 25.  The plan after that meeting was for PCI of the right coronary artery.  She is with her family today in clinic.  She tells me she has an appointment next week with Dr. Juliann Pares.         Past medical, surgical, social and family history were reviewed.  ROS:   Please see the history of present illness.    All other systems reviewed and are negative.  EKGs/Labs/Other Studies Reviewed:    The following studies were reviewed today:  March 27, 2023 cardiac MRI showed EF 28% No LGE Normal RV         Physical Exam:    VS:  BP 128/66   Pulse 66   Ht 5\' 1"  (1.549 m)   Wt 183 lb (83 kg)   SpO2 90%   BMI 34.58 kg/m     Wt Readings from Last 3 Encounters:  04/29/23 183 lb (83 kg)  04/19/23 180 lb (81.6 kg)  03/11/23 180 lb (81.6 kg)     GEN: no distress CARD: RRR, No MRG RESP: No IWOB. CTAB.      ASSESSMENT:    1. PVC (premature ventricular contraction)   2. Chronic systolic heart failure (HCC)    PLAN:    In order of problems listed above:  #Coronary artery disease Continue aspirin, Imdur Revascularization was planned but does not appear to be completed at this time.   Will plan to reassess her ejection fraction 8 weeks after PCI to the right coronary artery.  If persistently reduced despite revascularization, plan for ICD.  I will send a note to Dr. Herbie Baltimore and Dr. Okey Dupre regarding revascularization plan.  #Chronic systolic heart failure NYHA class II-III.  Last EF 28% on cardiac MRI.  Plan for reassessment after PCI. Continue torsemide, metoprolol, Imdur, Jardiance  Follow-up in 3 months with APP    Signed, Steffanie Dunn, MD, [facility], [facility] 04/29/2023 1:30 PM    Electrophysiology [facility]

## 2023-04-29 ENCOUNTER — Ambulatory Visit: Payer: Medicare Other | Attending: Cardiology | Admitting: Cardiology

## 2023-04-29 ENCOUNTER — Encounter: Payer: Self-pay | Admitting: Cardiology

## 2023-04-29 ENCOUNTER — Other Ambulatory Visit
Admission: RE | Admit: 2023-04-29 | Discharge: 2023-04-29 | Disposition: A | Discharge status: Home / Self Care | Source: Ambulatory Visit | Attending: Nurse Practitioner | Admitting: Nurse Practitioner

## 2023-04-29 VITALS — BP 128/66 | HR 66 | Ht 61.0 in | Wt 183.0 lb

## 2023-04-29 DIAGNOSIS — I5022 Chronic systolic (congestive) heart failure: Secondary | ICD-10-CM

## 2023-04-29 DIAGNOSIS — I493 Ventricular premature depolarization: Secondary | ICD-10-CM | POA: Diagnosis present

## 2023-04-29 DIAGNOSIS — I5033 Acute on chronic diastolic (congestive) heart failure: Secondary | ICD-10-CM | POA: Diagnosis present

## 2023-04-29 LAB — BRAIN NATRIURETIC PEPTIDE: B Natriuretic Peptide: 1082.3 pg/mL — ABNORMAL HIGH (ref 0.0–100.0)

## 2023-04-29 NOTE — Patient Instructions (Signed)
 Medication Instructions:  Your physician recommends that you continue on your current medications as directed. Please refer to the Current Medication list given to you today.  *If you need a refill on your cardiac medications before your next appointment, please call your pharmacy*  Follow-Up: At [facility], you and your health needs are our priority.  As part of our continuing mission to provide you with exceptional heart care, we have created designated Provider Care Teams.  These Care Teams include your primary Cardiologist (physician) and Advanced Practice Providers (APPs -  Physician Assistants and Nurse Practitioners) who all work together to provide you with the care you need, when you need it.   Your next appointment:   3 months  Provider:   You will see one of the following Advanced Practice Providers on your designated Care Team:   Francis Dowse, Charlott Holler [address]" [place], [place] Sherie Don, NP Canary Brim, NP

## 2023-04-30 NOTE — Telephone Encounter (Signed)
 Patient is returning call and she would like to clarify why she needs to see Dr. Kirke Corin.

## 2023-04-30 NOTE — Telephone Encounter (Signed)
 The patient has been made aware of the reasoning for the appointment. She has an appointment with Dr. Kirke Corin on 3/11 at 3:40. She has been given directions to the office.

## 2023-04-30 NOTE — Telephone Encounter (Addendum)
 Left a message for the patient to call back to add her to Dr. Jari Sportsman schedule on 3/11 at 1:20 pm.

## 2023-05-07 ENCOUNTER — Encounter: Payer: Self-pay | Admitting: Cardiovascular Disease

## 2023-05-07 ENCOUNTER — Ambulatory Visit: Attending: Cardiovascular Disease | Admitting: Cardiovascular Disease

## 2023-05-07 ENCOUNTER — Other Ambulatory Visit
Admission: RE | Admit: 2023-05-07 | Discharge: 2023-05-07 | Disposition: A | Discharge status: Home / Self Care | Source: Ambulatory Visit | Attending: Cardiovascular Disease | Admitting: Cardiovascular Disease

## 2023-05-07 VITALS — BP 146/74 | HR 67 | Ht 61.0 in | Wt 181.6 lb

## 2023-05-07 DIAGNOSIS — I5032 Chronic diastolic (congestive) heart failure: Secondary | ICD-10-CM | POA: Diagnosis present

## 2023-05-07 DIAGNOSIS — I5022 Chronic systolic (congestive) heart failure: Secondary | ICD-10-CM | POA: Insufficient documentation

## 2023-05-07 DIAGNOSIS — I2510 Atherosclerotic heart disease of native coronary artery without angina pectoris: Secondary | ICD-10-CM | POA: Diagnosis not present

## 2023-05-07 DIAGNOSIS — E785 Hyperlipidemia, unspecified: Secondary | ICD-10-CM | POA: Diagnosis present

## 2023-05-07 DIAGNOSIS — I25118 Atherosclerotic heart disease of native coronary artery with other forms of angina pectoris: Secondary | ICD-10-CM | POA: Diagnosis present

## 2023-05-07 DIAGNOSIS — I10 Essential (primary) hypertension: Secondary | ICD-10-CM | POA: Diagnosis present

## 2023-05-07 LAB — BASIC METABOLIC PANEL
Anion gap: 11 (ref 5–15)
BUN: 27 mg/dL — ABNORMAL HIGH (ref 8–23)
CO2: 29 mmol/L (ref 22–32)
Calcium: 9.7 mg/dL (ref 8.9–10.3)
Chloride: 97 mmol/L — ABNORMAL LOW (ref 98–111)
Creatinine, Ser: 1.52 mg/dL — ABNORMAL HIGH (ref 0.44–1.00)
GFR, Estimated: 36 mL/min — ABNORMAL LOW (ref >60)
Glucose, Bld: 129 mg/dL — ABNORMAL HIGH (ref 70–99)
Potassium: 4.2 mmol/L (ref 3.5–5.1)
Sodium: 137 mmol/L (ref 135–145)

## 2023-05-07 LAB — CBC
HCT: 41.2 % (ref 36.0–46.0)
Hemoglobin: 13.5 g/dL (ref 12.0–15.0)
MCH: 32.5 pg (ref 26.0–34.0)
MCHC: 32.8 g/dL (ref 30.0–36.0)
MCV: 99 fL (ref 80.0–100.0)
Platelets: 222 10*3/uL (ref 150–400)
RBC: 4.16 MIL/uL (ref 3.87–5.11)
RDW: 14.1 % (ref 11.5–15.5)
WBC: 7.5 10*3/uL (ref 4.0–10.5)
nRBC: 0 % (ref 0.0–0.2)

## 2023-05-07 MED ORDER — CLOPIDOGREL BISULFATE 75 MG PO TABS: 75.0000 mg | ORAL_TABLET | Freq: Every day | ORAL | 1 refills | Status: DC

## 2023-05-07 NOTE — Patient Instructions (Signed)
 Medication Instructions:  START Clopidogrel (Plavix) 75 mg once daily  *If you need a refill on your cardiac medications before your next appointment, please call your pharmacy*  Testing/Procedures: Your physician has requested that you have a cardiac catheterization. Cardiac catheterization is used to diagnose and/or treat various heart conditions. Doctors may recommend this procedure for a number of different reasons. The most common reason is to evaluate chest pain. Chest pain can be a symptom of coronary artery disease (CAD), and cardiac catheterization can show whether plaque is narrowing or blocking your heart's arteries. This procedure is also used to evaluate the valves, as well as measure the blood flow and oxygen levels in different parts of your heart. For further information please visit [email or web address] Please follow instruction sheet, as given.    Follow-Up: At [facility], you and your health needs are our priority.  As part of our continuing mission to provide you with exceptional heart care, we have created designated Provider Care Teams.  These Care Teams include your primary Cardiologist (physician) and Advanced Practice Providers (APPs -  Physician Assistants and Nurse Practitioners) who all work together to provide you with the care you need, when you need it.  We recommend signing up for the patient portal called "MyChart".  Sign up information is provided on this After Visit Summary.  MyChart is used to connect with patients for Virtual Visits (Telemedicine).  Patients are able to view lab/test results, encounter notes, upcoming appointments, etc.  Non-urgent messages can be sent to your provider as well.   To learn more about what you can do with MyChart, go to [email or web address].    Your next appointment:   Follow up with Dr. Juliann Pares  Other Instructions  [facility] A DEPT OF [place]. [facility] AT  [facility] [address] Jodi Ch[address], [address] [place] [address] Dept: [phone] Loc: [phone]  Jodi Chavez  05/07/2023  You are scheduled for a Cardiac Catheterization on Wednesday, March 19 with Dr. Lorine Bears.  1. Please arrive at the [facility] (Main Entrance A) at [facility]: [address] [place], [place] [address] at 6:00 AM (This time is 4 hour(s) before your procedure to ensure your preparation).   Free valet parking service is available. You will check in at ADMITTING. The support person will be asked to wait in the waiting room.  It is OK to have someone drop you off and come back when you are ready to be discharged.    Special note: Every effort is made to have your procedure done on time. Please understand that emergencies sometimes delay scheduled procedures.  2. Diet: Do not eat solid foods after midnight.  The patient may have clear liquids until 5am upon the day of the procedure.  3. Labs: You will need to have blood drawn by 05/10/23. You do not need to be fasting.  Please go to the [facility] entrance and check in at the front desk.  You do not need an appointment.  They are open from 7am-6 pm.  You will not need to be fasting.   4. Medication instructions in preparation for your procedure: Hold all diabetic medication the morning of the cath  -hold the Metformin the morning of the procedure and then 48 hours after Hold the Torsemide the morning of the procedure   On the morning of your procedure, take your Aspirin 81 mg and Plavix/Clopidogrel and any morning medicines NOT listed  above.  You may use sips of water.  5. Plan to go home the same day, you will only stay overnight if medically necessary. 6. Bring a current list of your medications and current insurance cards. 7. You MUST have a responsible person to drive you home. 8. Someone MUST be with you the first 24 hours after you arrive home or your discharge will be  delayed. 9. Please wear clothes that are easy to get on and off and wear slip-on shoes.  Thank you for allowing [place] to care for you!   -- [facility]

## 2023-05-07 NOTE — Progress Notes (Unsigned)
 Cardiology Office Note   Date:  05/09/2023   ID:  AHTZIRI Jodi Chavez, DOB December 07, 1950, MRN [id number]  PCP:  Jodi Contras, MD  Cardiologist:  Dr. Juliann Pares  No chief complaint on file.     History of Present Illness: Jodi Chavez is a 73 y.o. female who presents to discuss high risk PCI.  She has known history of chronic systolic heart failure, coronary artery disease, essential hypertension, hyperlipidemia, type 2 diabetes and stage IIIb chronic kidney disease.  She had abnormal cardiac CTA followed by cardiac catheterization in November 2024 which showed 90% diffuse proximal LAD disease followed by chronically occluded vessel in the midsegment with no clear collaterals.  The RCA was large with 95% mid stenosis which was heavily calcified.  She was referred to Dr. Cliffton Chavez who was concerned about no good target in the LAD and also inability to recover postsurgery.  Echocardiogram last month showed an EF of 30 to 35%, mild to moderate mitral regurgitation and mild aortic stenosis.  She reports intermittent chest pain with exertion but she is mostly limited by dyspnea with minimal activities.  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 We discussed her case during the heart team meeting and it was felt that her best option is to proceed with RCA atherectomy and stent placement given that the LAD was likely not graftable.    Past Medical History:  Diagnosis Date   Acute respiratory failure with hypoxia (HCC) 05/27/2022   Arthritis    hip   CHF (congestive heart failure) (HCC)    Constipation    uses laxatives several times a week   Depression    Diabetes mellitus    takes Metformin and Amaryl daily   Dizziness    occasionally and related to meds    Early cataracts, bilateral    History of bronchitis    last time several yrs ago   History of migraine    many yrs ago   Hyperlipidemia    takes Zetia and Zocor daily   Hypertension    takes Benazepril  nightly and Propranolol tid and Clonidine daily   Insomnia    Low back pain    Peripheral edema    Peripheral neuropathy    PONV (postoperative nausea and vomiting)    Slow urinary stream    occasionally    Past Surgical History:  Procedure Laterality Date   COLONOSCOPY     d&c/hysteroscopy/ablation     DILATION AND CURETTAGE OF UTERUS     couple of times   ESOPHAGOGASTRODUODENOSCOPY     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   LEFT HEART CATH AND CORONARY ANGIOGRAPHY Left 01/14/2023   Procedure: LEFT HEART CATH AND CORONARY ANGIOGRAPHY;  Surgeon: Jodi Pea, MD;  Location: [facility] INVASIVE CV LAB;  Service: Cardiovascular;  Laterality: Left;   POSTERIOR CERVICAL FUSION/FORAMINOTOMY  10/15/2011   Procedure: POSTERIOR CERVICAL FUSION/FORAMINOTOMY LEVEL 2;  Surgeon: Jodi Champagne, MD;  Location: [facility] OR;  Service: Orthopedics;  Laterality: N/A;  Right C6-7, C7-T1 Foraminotomy with excision HNP C7-T1   UPPER GASTROINTESTINAL ENDOSCOPY       Current Outpatient Medications  Medication Sig Dispense Refill   acetaminophen (TYLENOL) 500 MG tablet Take 500-1,000 mg by mouth See admin instructions. Take 1000 mg in the morning 500 mg at lunch and 1000 mg at bedtime     ascorbic acid (VITAMIN C) 500 MG tablet Take 500 mg by  mouth daily.     aspirin 81 MG EC tablet Take 81 mg by mouth daily.     Calcium Carbonate-Vitamin D (OYSTER SHELL CALCIUM/D PO) Take 2 tablets by mouth 2 (two) times daily. Citracal Max plus 1300 mg     clopidogrel (PLAVIX) 75 MG tablet Take 1 tablet (75 mg total) by mouth daily. 30 tablet 1   empagliflozin (JARDIANCE) 10 MG TABS tablet Take 1 tablet (10 mg total) by mouth daily.     ezetimibe (ZETIA) 10 MG tablet Take 10 mg by mouth at bedtime.     fluticasone (FLONASE) 50 MCG/ACT nasal spray Place 2 sprays into both nostrils daily.     gabapentin (NEURONTIN) 300 MG capsule Take 300 mg by mouth 3 (three) times daily.     GARLIC PO Take 1,610 mg by  mouth daily after breakfast. Blood pressure formula     GLUCOSAMINE-CHONDROITIN PO Take 1 tablet by mouth 2 (two) times daily. 1500 mg glucosamine 1200 mg Chondroitin     isosorbide mononitrate (IMDUR) 60 MG 24 hr tablet Take 60 mg by mouth daily.     loratadine (CLARITIN) 10 MG tablet Take 1 tablet (10 mg total) by mouth daily.     losartan (COZAAR) 25 MG tablet Take 1 tablet by mouth daily.     magnesium oxide (MAG-OX) 400 MG tablet Take 400 mg by mouth 2 (two) times daily.     metFORMIN (GLUCOPHAGE) 850 MG tablet Take 850 mg by mouth daily with breakfast.     metoprolol succinate (TOPROL-XL) 100 MG 24 hr tablet Take 100 mg by mouth daily.     Multiple Vitamin (MULITIVITAMIN WITH MINERALS) TABS Take 1 tablet by mouth daily after breakfast.      ondansetron (ZOFRAN) 4 MG tablet Take 1 tablet (4 mg total) by mouth every 8 (eight) hours as needed for nausea or vomiting. 30 tablet 0   ondansetron (ZOFRAN-ODT) 4 MG disintegrating tablet Take 4 mg by mouth every 8 (eight) hours as needed.     oxyCODONE-acetaminophen (PERCOCET/ROXICET) 5-325 MG tablet Take 1 tablet by mouth every 6 (six) hours as needed for severe pain (pain score 7-10).     pantoprazole (PROTONIX) 40 MG tablet Take 1 tablet (40 mg total) by mouth daily. 30 tablet 2   polyethylene glycol (MIRALAX / GLYCOLAX) 17 g packet Take 17 g by mouth 2 (two) times daily. (Patient taking differently: Take 17 g by mouth every morning.) 30 each 0   simvastatin (ZOCOR) 40 MG tablet Take 40 mg by mouth every evening.     torsemide (DEMADEX) 20 MG tablet Take 1 tablet (20 mg total) by mouth daily as needed (for increased swelling or weight gain). (Patient taking differently: Take 30 mg by mouth every morning.)     traZODone (DESYREL) 50 MG tablet Take 25 mg by mouth at bedtime.     VENTOLIN HFA 108 (90 Base) MCG/ACT inhaler 1 puff every 4 (four) hours as needed for wheezing or shortness of breath.     zinc sulfate 220 (50 Zn) MG capsule Take 250 mg by  mouth daily.     No current facility-administered medications for this visit.    Allergies:   Naproxen sodium    Social History:  The patient  reports that she has never smoked. She has never used smokeless tobacco. She reports that she does not drink alcohol and does not use drugs.   Family History:  The patient's family history includes Cancer in her mother; Colon cancer (  age of onset: 61) in her mother; Diabetes in her mother; Gout in her father; Heart disease in her father; Hypertension in her mother; Stroke in her mother.    ROS:  Please see the history of present illness.   Otherwise, review of systems are positive for none.   All other systems are reviewed and negative.    PHYSICAL EXAM: VS:  BP (!) 146/74 (BP Location: Left Arm, Patient Position: Sitting)   Pulse 67   Ht 5\' 1"  (1.549 m)   Wt 181 lb 9.6 oz (82.4 kg)   SpO2 94%   BMI 34.31 kg/m  , BMI Body mass index is 34.31 kg/m. GEN: Well nourished, well developed, in no acute distress  HEENT: normal  Neck: no JVD, carotid bruits, or masses Cardiac: RRR; no murmurs, rubs, or gallops,no edema  Respiratory:  clear to auscultation bilaterally, normal work of breathing GI: soft, nontender, nondistended, + BS MS: no deformity or atrophy  Skin: warm and dry, no rash Neuro:  Strength and sensation are intact Psych: euthymic mood, full affect   EKG:  EKG is ordered today. The ekg ordered today demonstrates : Sinus rhythm with occasional Premature ventricular complexes Possible Left atrial enlargement Anterior infarct , age undetermined When compared with ECG of 11-Mar-2023 11:51, Premature ventricular complexes are now Present Left posterior fascicular block is no longer Present Anterior infarct is now Present Nonspecific T wave abnormality has replaced inverted T waves in Inferior leads      Recent Labs: 09/24/2022: Magnesium 2.2 01/22/2023: ALT 17 04/29/2023: B Natriuretic Peptide 1,082.3 05/07/2023: BUN 27;  Creatinine, Ser 1.52; Hemoglobin 13.5; Platelets 222; Potassium 4.2; Sodium 137    Lipid Panel    Component Value Date/Time   CHOL 102 08/11/2022 1703   TRIG 186 (H) 08/11/2022 1703   HDL 32 (L) 08/11/2022 1703   CHOLHDL 3.2 08/11/2022 1703   VLDL 37 08/11/2022 1703   LDLCALC 33 08/11/2022 1703   LDLCALC 59 03/26/2022 1435      Wt Readings from Last 3 Encounters:  05/07/23 181 lb 9.6 oz (82.4 kg)  04/29/23 183 lb (83 kg)  04/19/23 180 lb (81.6 kg)           No data to display            ASSESSMENT AND PLAN:  1.  Coronary artery disease involving native coronary arteries with stable angina: She has severe two-vessel coronary artery disease with chronically occluded LAD which is a poor target for bypass.  Thus, we have recommended right coronary artery atherectomy and stent placement.  I discussed the procedure in details as well as risk and benefits.  Given calcifications and tortuosity, I explained to the patient and family that this is a high risk procedure than an average PCI and there is increased risk of dissection and perforation.  In addition, she has underlying chronic kidney disease and increased risk of contrast-induced nephropathy.  Will plan on 4 hours of hydration.  I added clopidogrel 75 mg once daily. Radial access is not a good option given that she has to use a walker after the procedure for mobility and we have agreed for femoral access.  2.  Chronic systolic heart failure: EF of 30 to 35%.  She appears to be euvolemic.  Continue Toprol, losartan and Jardiance.  She does have chronic kidney disease and has to be cautious with spironolactone given GFR of 35.  3.  Essential hypertension: Blood pressure is reasonably controlled.  4.  Hyperlipidemia:  She is currently on simvastatin and ezetimibe.  Recommended target LDL of less than 55.    Disposition:   Proceed with RCA atherectomy and follow-up after with Dr. Juliann Pares.  Signed,  Lorine Bears, MD   05/09/2023 11:52 AM    [facility]

## 2023-05-07 NOTE — H&P (View-Only) (Signed)
 Cardiology Office Note   Date:  05/09/2023   ID:  Jodi Chavez, DOB December 07, 1950, MRN [id number]  PCP:  Fleet Contras, MD  Cardiologist:  Dr. Juliann Pares  No chief complaint on file.     History of Present Illness: Jodi Chavez is a 73 y.o. female who presents to discuss high risk PCI.  She has known history of chronic systolic heart failure, coronary artery disease, essential hypertension, hyperlipidemia, type 2 diabetes and stage IIIb chronic kidney disease.  She had abnormal cardiac CTA followed by cardiac catheterization in November 2024 which showed 90% diffuse proximal LAD disease followed by chronically occluded vessel in the midsegment with no clear collaterals.  The RCA was large with 95% mid stenosis which was heavily calcified.  She was referred to Dr. Cliffton Asters who was concerned about no good target in the LAD and also inability to recover postsurgery.  Echocardiogram last month showed an EF of 30 to 35%, mild to moderate mitral regurgitation and mild aortic stenosis.  She reports intermittent chest pain with exertion but she is mostly limited by dyspnea with minimal activities.  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 We discussed her case during the heart team meeting and it was felt that her best option is to proceed with RCA atherectomy and stent placement given that the LAD was likely not graftable.    Past Medical History:  Diagnosis Date   Acute respiratory failure with hypoxia (HCC) 05/27/2022   Arthritis    hip   CHF (congestive heart failure) (HCC)    Constipation    uses laxatives several times a week   Depression    Diabetes mellitus    takes Metformin and Amaryl daily   Dizziness    occasionally and related to meds    Early cataracts, bilateral    History of bronchitis    last time several yrs ago   History of migraine    many yrs ago   Hyperlipidemia    takes Zetia and Zocor daily   Hypertension    takes Benazepril  nightly and Propranolol tid and Clonidine daily   Insomnia    Low back pain    Peripheral edema    Peripheral neuropathy    PONV (postoperative nausea and vomiting)    Slow urinary stream    occasionally    Past Surgical History:  Procedure Laterality Date   COLONOSCOPY     d&c/hysteroscopy/ablation     DILATION AND CURETTAGE OF UTERUS     couple of times   ESOPHAGOGASTRODUODENOSCOPY     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   LEFT HEART CATH AND CORONARY ANGIOGRAPHY Left 01/14/2023   Procedure: LEFT HEART CATH AND CORONARY ANGIOGRAPHY;  Surgeon: Alwyn Pea, MD;  Location: [facility] INVASIVE CV LAB;  Service: Cardiovascular;  Laterality: Left;   POSTERIOR CERVICAL FUSION/FORAMINOTOMY  10/15/2011   Procedure: POSTERIOR CERVICAL FUSION/FORAMINOTOMY LEVEL 2;  Surgeon: Kerrin Champagne, MD;  Location: [facility] OR;  Service: Orthopedics;  Laterality: N/A;  Right C6-7, C7-T1 Foraminotomy with excision HNP C7-T1   UPPER GASTROINTESTINAL ENDOSCOPY       Current Outpatient Medications  Medication Sig Dispense Refill   acetaminophen (TYLENOL) 500 MG tablet Take 500-1,000 mg by mouth See admin instructions. Take 1000 mg in the morning 500 mg at lunch and 1000 mg at bedtime     ascorbic acid (VITAMIN C) 500 MG tablet Take 500 mg by  mouth daily.     aspirin 81 MG EC tablet Take 81 mg by mouth daily.     Calcium Carbonate-Vitamin D (OYSTER SHELL CALCIUM/D PO) Take 2 tablets by mouth 2 (two) times daily. Citracal Max plus 1300 mg     clopidogrel (PLAVIX) 75 MG tablet Take 1 tablet (75 mg total) by mouth daily. 30 tablet 1   empagliflozin (JARDIANCE) 10 MG TABS tablet Take 1 tablet (10 mg total) by mouth daily.     ezetimibe (ZETIA) 10 MG tablet Take 10 mg by mouth at bedtime.     fluticasone (FLONASE) 50 MCG/ACT nasal spray Place 2 sprays into both nostrils daily.     gabapentin (NEURONTIN) 300 MG capsule Take 300 mg by mouth 3 (three) times daily.     GARLIC PO Take 1,610 mg by  mouth daily after breakfast. Blood pressure formula     GLUCOSAMINE-CHONDROITIN PO Take 1 tablet by mouth 2 (two) times daily. 1500 mg glucosamine 1200 mg Chondroitin     isosorbide mononitrate (IMDUR) 60 MG 24 hr tablet Take 60 mg by mouth daily.     loratadine (CLARITIN) 10 MG tablet Take 1 tablet (10 mg total) by mouth daily.     losartan (COZAAR) 25 MG tablet Take 1 tablet by mouth daily.     magnesium oxide (MAG-OX) 400 MG tablet Take 400 mg by mouth 2 (two) times daily.     metFORMIN (GLUCOPHAGE) 850 MG tablet Take 850 mg by mouth daily with breakfast.     metoprolol succinate (TOPROL-XL) 100 MG 24 hr tablet Take 100 mg by mouth daily.     Multiple Vitamin (MULITIVITAMIN WITH MINERALS) TABS Take 1 tablet by mouth daily after breakfast.      ondansetron (ZOFRAN) 4 MG tablet Take 1 tablet (4 mg total) by mouth every 8 (eight) hours as needed for nausea or vomiting. 30 tablet 0   ondansetron (ZOFRAN-ODT) 4 MG disintegrating tablet Take 4 mg by mouth every 8 (eight) hours as needed.     oxyCODONE-acetaminophen (PERCOCET/ROXICET) 5-325 MG tablet Take 1 tablet by mouth every 6 (six) hours as needed for severe pain (pain score 7-10).     pantoprazole (PROTONIX) 40 MG tablet Take 1 tablet (40 mg total) by mouth daily. 30 tablet 2   polyethylene glycol (MIRALAX / GLYCOLAX) 17 g packet Take 17 g by mouth 2 (two) times daily. (Patient taking differently: Take 17 g by mouth every morning.) 30 each 0   simvastatin (ZOCOR) 40 MG tablet Take 40 mg by mouth every evening.     torsemide (DEMADEX) 20 MG tablet Take 1 tablet (20 mg total) by mouth daily as needed (for increased swelling or weight gain). (Patient taking differently: Take 30 mg by mouth every morning.)     traZODone (DESYREL) 50 MG tablet Take 25 mg by mouth at bedtime.     VENTOLIN HFA 108 (90 Base) MCG/ACT inhaler 1 puff every 4 (four) hours as needed for wheezing or shortness of breath.     zinc sulfate 220 (50 Zn) MG capsule Take 250 mg by  mouth daily.     No current facility-administered medications for this visit.    Allergies:   Naproxen sodium    Social History:  The patient  reports that she has never smoked. She has never used smokeless tobacco. She reports that she does not drink alcohol and does not use drugs.   Family History:  The patient's family history includes Cancer in her mother; Colon cancer (  age of onset: 61) in her mother; Diabetes in her mother; Gout in her father; Heart disease in her father; Hypertension in her mother; Stroke in her mother.    ROS:  Please see the history of present illness.   Otherwise, review of systems are positive for none.   All other systems are reviewed and negative.    PHYSICAL EXAM: VS:  BP (!) 146/74 (BP Location: Left Arm, Patient Position: Sitting)   Pulse 67   Ht 5\' 1"  (1.549 m)   Wt 181 lb 9.6 oz (82.4 kg)   SpO2 94%   BMI 34.31 kg/m  , BMI Body mass index is 34.31 kg/m. GEN: Well nourished, well developed, in no acute distress  HEENT: normal  Neck: no JVD, carotid bruits, or masses Cardiac: RRR; no murmurs, rubs, or gallops,no edema  Respiratory:  clear to auscultation bilaterally, normal work of breathing GI: soft, nontender, nondistended, + BS MS: no deformity or atrophy  Skin: warm and dry, no rash Neuro:  Strength and sensation are intact Psych: euthymic mood, full affect   EKG:  EKG is ordered today. The ekg ordered today demonstrates : Sinus rhythm with occasional Premature ventricular complexes Possible Left atrial enlargement Anterior infarct , age undetermined When compared with ECG of 11-Mar-2023 11:51, Premature ventricular complexes are now Present Left posterior fascicular block is no longer Present Anterior infarct is now Present Nonspecific T wave abnormality has replaced inverted T waves in Inferior leads      Recent Labs: 09/24/2022: Magnesium 2.2 01/22/2023: ALT 17 04/29/2023: B Natriuretic Peptide 1,082.3 05/07/2023: BUN 27;  Creatinine, Ser 1.52; Hemoglobin 13.5; Platelets 222; Potassium 4.2; Sodium 137    Lipid Panel    Component Value Date/Time   CHOL 102 08/11/2022 1703   TRIG 186 (H) 08/11/2022 1703   HDL 32 (L) 08/11/2022 1703   CHOLHDL 3.2 08/11/2022 1703   VLDL 37 08/11/2022 1703   LDLCALC 33 08/11/2022 1703   LDLCALC 59 03/26/2022 1435      Wt Readings from Last 3 Encounters:  05/07/23 181 lb 9.6 oz (82.4 kg)  04/29/23 183 lb (83 kg)  04/19/23 180 lb (81.6 kg)           No data to display            ASSESSMENT AND PLAN:  1.  Coronary artery disease involving native coronary arteries with stable angina: She has severe two-vessel coronary artery disease with chronically occluded LAD which is a poor target for bypass.  Thus, we have recommended right coronary artery atherectomy and stent placement.  I discussed the procedure in details as well as risk and benefits.  Given calcifications and tortuosity, I explained to the patient and family that this is a high risk procedure than an average PCI and there is increased risk of dissection and perforation.  In addition, she has underlying chronic kidney disease and increased risk of contrast-induced nephropathy.  Will plan on 4 hours of hydration.  I added clopidogrel 75 mg once daily. Radial access is not a good option given that she has to use a walker after the procedure for mobility and we have agreed for femoral access.  2.  Chronic systolic heart failure: EF of 30 to 35%.  She appears to be euvolemic.  Continue Toprol, losartan and Jardiance.  She does have chronic kidney disease and has to be cautious with spironolactone given GFR of 35.  3.  Essential hypertension: Blood pressure is reasonably controlled.  4.  Hyperlipidemia:  She is currently on simvastatin and ezetimibe.  Recommended target LDL of less than 55.    Disposition:   Proceed with RCA atherectomy and follow-up after with Dr. Juliann Pares.  Signed,  Lorine Bears, MD   05/09/2023 11:52 AM    [facility]

## 2023-05-14 ENCOUNTER — Telehealth: Payer: Self-pay | Admitting: *Deleted

## 2023-05-14 NOTE — Telephone Encounter (Signed)
 Error - duplicate

## 2023-05-14 NOTE — Telephone Encounter (Addendum)
 Coronary Atherectomy scheduled at [facility] for: Wednesday May 15, 2023 10:30 AM Arrival time [facility] Main Entrance A at: 6 AM-pre-procedure hydration  Nothing to eat after midnight prior to procedure, clear liquids until 5 AM day of procedure.  Medication instructions: -Hold:  Jardiance-AM of procedure  Torsemide/Losartan-day before and day of procedure-per protocol GFR < 60 (36)-pt already taken today  -Other usual morning medications can be taken with sips of water including aspirin 81 mg and Plavix 75 mg  Plan to go home the same day, you will only stay overnight if medically necessary.  You must have responsible adult to drive you home.  Someone must be with you the first 24 hours after you arrive home.  Reviewed procedure instructions with patient.

## 2023-05-15 ENCOUNTER — Other Ambulatory Visit: Payer: Self-pay

## 2023-05-15 ENCOUNTER — Ambulatory Visit (HOSPITAL_COMMUNITY)
Admission: RE | Admit: 2023-05-15 | Discharge: 2023-05-15 | Disposition: A | Discharge status: Home / Self Care | Source: Ambulatory Visit | Attending: Cardiovascular Disease | Admitting: Cardiovascular Disease

## 2023-05-15 ENCOUNTER — Encounter (HOSPITAL_COMMUNITY)
Admission: RE | Disposition: A | Discharge status: Home / Self Care | Payer: Self-pay | Source: Ambulatory Visit | Attending: Cardiovascular Disease

## 2023-05-15 DIAGNOSIS — Z7902 Long term (current) use of antithrombotics/antiplatelets: Secondary | ICD-10-CM | POA: Insufficient documentation

## 2023-05-15 DIAGNOSIS — E1122 Type 2 diabetes mellitus with diabetic chronic kidney disease: Secondary | ICD-10-CM | POA: Insufficient documentation

## 2023-05-15 DIAGNOSIS — N1832 Chronic kidney disease, stage 3b: Secondary | ICD-10-CM | POA: Diagnosis not present

## 2023-05-15 DIAGNOSIS — E785 Hyperlipidemia, unspecified: Secondary | ICD-10-CM | POA: Insufficient documentation

## 2023-05-15 DIAGNOSIS — I5022 Chronic systolic (congestive) heart failure: Secondary | ICD-10-CM | POA: Diagnosis not present

## 2023-05-15 DIAGNOSIS — I25118 Atherosclerotic heart disease of native coronary artery with other forms of angina pectoris: Secondary | ICD-10-CM | POA: Insufficient documentation

## 2023-05-15 DIAGNOSIS — Z7984 Long term (current) use of oral hypoglycemic drugs: Secondary | ICD-10-CM | POA: Diagnosis not present

## 2023-05-15 DIAGNOSIS — Z79899 Other long term (current) drug therapy: Secondary | ICD-10-CM | POA: Insufficient documentation

## 2023-05-15 DIAGNOSIS — I130 Hypertensive heart and chronic kidney disease with heart failure and stage 1 through stage 4 chronic kidney disease, or unspecified chronic kidney disease: Secondary | ICD-10-CM | POA: Diagnosis not present

## 2023-05-15 DIAGNOSIS — I2584 Coronary atherosclerosis due to calcified coronary lesion: Secondary | ICD-10-CM | POA: Diagnosis not present

## 2023-05-15 DIAGNOSIS — I2582 Chronic total occlusion of coronary artery: Secondary | ICD-10-CM | POA: Insufficient documentation

## 2023-05-15 HISTORY — PX: CORONARY BALLOON ANGIOPLASTY: CATH118233

## 2023-05-15 HISTORY — PX: LEFT HEART CATH AND CORONARY ANGIOGRAPHY: CATH118249

## 2023-05-15 LAB — GLUCOSE, CAPILLARY
Glucose-Capillary: 120 mg/dL — ABNORMAL HIGH (ref 70–99)
Glucose-Capillary: 120 mg/dL — ABNORMAL HIGH (ref 70–99)

## 2023-05-15 SURGERY — CORONARY BALLOON ANGIOPLASTY
Anesthesia: LOCAL

## 2023-05-15 MED ORDER — SODIUM CHLORIDE 0.9 % IV SOLN: INTRAVENOUS | Status: DC

## 2023-05-15 MED ORDER — SODIUM CHLORIDE 0.9 % IV SOLN
5.0000 mg/kg | Freq: Once | INTRAVENOUS | Status: DC
  Filled 2023-05-15: qty 17.32

## 2023-05-15 MED ORDER — IOHEXOL 350 MG/ML SOLN
INTRAVENOUS | Status: DC | PRN
  Administered 2023-05-15: 80 mL via INTRA_ARTERIAL

## 2023-05-15 MED ORDER — FENTANYL CITRATE (PF) 100 MCG/2ML IJ SOLN
INTRAMUSCULAR | Status: DC | PRN
  Administered 2023-05-15: 50 ug via INTRAVENOUS

## 2023-05-15 MED ORDER — FENTANYL CITRATE (PF) 100 MCG/2ML IJ SOLN
INTRAMUSCULAR | Status: AC
  Filled 2023-05-15: qty 2

## 2023-05-15 MED ORDER — SODIUM CHLORIDE 0.9% FLUSH: 3.0000 mL | INTRAVENOUS | Status: DC | PRN

## 2023-05-15 MED ORDER — ASPIRIN 81 MG PO CHEW
81.0000 mg | CHEWABLE_TABLET | ORAL | Status: DC
Start: 2023-05-15 — End: 2023-05-15

## 2023-05-15 MED ORDER — MIDAZOLAM HCL 2 MG/2ML IJ SOLN
INTRAMUSCULAR | Status: AC
  Filled 2023-05-15: qty 2

## 2023-05-15 MED ORDER — ONDANSETRON HCL 4 MG/2ML IJ SOLN
4.0000 mg | Freq: Four times a day (QID) | INTRAMUSCULAR | Status: DC | PRN
Start: 2023-05-15 — End: 2023-05-15

## 2023-05-15 MED ORDER — MIDAZOLAM HCL 2 MG/2ML IJ SOLN
INTRAMUSCULAR | Status: DC | PRN
  Administered 2023-05-15: 1 mg via INTRAVENOUS

## 2023-05-15 MED ORDER — LIDOCAINE HCL (PF) 1 % IJ SOLN
INTRAMUSCULAR | Status: AC
Start: 2023-05-15 — End: ?
  Filled 2023-05-15: qty 30

## 2023-05-15 MED ORDER — SODIUM CHLORIDE 0.9% FLUSH: 3.0000 mL | Freq: Two times a day (BID) | INTRAVENOUS | Status: DC

## 2023-05-15 MED ORDER — ACETAMINOPHEN 325 MG PO TABS: 650.0000 mg | ORAL_TABLET | ORAL | Status: DC | PRN

## 2023-05-15 MED ORDER — ASPIRIN 81 MG PO CHEW
81.0000 mg | CHEWABLE_TABLET | ORAL | Status: DC
Start: 2023-05-16 — End: 2023-05-15

## 2023-05-15 MED ORDER — SODIUM CHLORIDE 0.9 % IV SOLN: 250.0000 mL | INTRAVENOUS | Status: DC | PRN

## 2023-05-15 MED ORDER — ONDANSETRON HCL 4 MG/2ML IJ SOLN
INTRAMUSCULAR | Status: DC | PRN
  Administered 2023-05-15: 4 mg via INTRAVENOUS

## 2023-05-15 MED ORDER — HEPARIN SODIUM (PORCINE) 1000 UNIT/ML IJ SOLN
INTRAMUSCULAR | Status: AC
  Filled 2023-05-15: qty 10

## 2023-05-15 MED ORDER — HEPARIN SODIUM (PORCINE) 1000 UNIT/ML IJ SOLN
INTRAMUSCULAR | Status: DC | PRN
  Administered 2023-05-15: 8000 [IU] via INTRAVENOUS

## 2023-05-15 MED ORDER — LIDOCAINE HCL (PF) 1 % IJ SOLN
INTRAMUSCULAR | Status: DC | PRN
  Administered 2023-05-15: 15 mL

## 2023-05-15 MED ORDER — ONDANSETRON HCL 4 MG/2ML IJ SOLN
INTRAMUSCULAR | Status: AC
  Filled 2023-05-15: qty 2

## 2023-05-15 MED ORDER — HEPARIN (PORCINE) IN NACL 1000-0.9 UT/500ML-% IV SOLN
INTRAVENOUS | Status: DC | PRN
  Administered 2023-05-15 (×2): 500 mL

## 2023-05-15 SURGICAL SUPPLY — 12 items
CATH INFINITI 5FR JL4 (CATHETERS) IMPLANT
CATH LAUNCHER 6FR JR4 (CATHETERS) IMPLANT
CATH TELEPORT (CATHETERS) IMPLANT
DEVICE CLOSURE MYNXGRIP 6/7F (Vascular Products) IMPLANT
GUIDEWIRE JUDO 3 190 (WIRE) IMPLANT
KIT ENCORE 26 ADVANTAGE (KITS) IMPLANT
KIT MICROPUNCTURE NIT STIFF (SHEATH) IMPLANT
PACK CARDIAC CATHETERIZATION (CUSTOM PROCEDURE TRAY) ×1 IMPLANT
SHEATH PINNACLE 6F 10CM (SHEATH) IMPLANT
WIRE ASAHI FIELDER XT 300CM (WIRE) IMPLANT
WIRE EMERALD 3MM-J .035X150CM (WIRE) IMPLANT
WIRE RUNTHROUGH IZANAI 014 300 (WIRE) IMPLANT

## 2023-05-15 NOTE — Interval H&P Note (Signed)
 History and Physical Interval Note:  05/15/2023 11:44 AM  Jodi Chavez  has presented today for surgery, with the diagnosis of chest pain.  The various methods of treatment have been discussed with the patient and family. After consideration of risks, benefits and other options for treatment, the patient has consented to  Procedure(s): CORONARY ATHERECTOMY (N/A) as a surgical intervention.  The patient's history has been reviewed, patient examined, no change in status, stable for surgery.  I have reviewed the patient's chart and labs.  Questions were answered to the patient's satisfaction.     Lorine Bears

## 2023-05-16 ENCOUNTER — Encounter (HOSPITAL_COMMUNITY): Payer: Self-pay | Admitting: Cardiovascular Disease

## 2023-05-16 LAB — POCT ACTIVATED CLOTTING TIME: Activated Clotting Time: 239 s

## 2023-05-20 ENCOUNTER — Other Ambulatory Visit: Payer: Self-pay | Admitting: *Deleted

## 2023-06-28 ENCOUNTER — Telehealth (HOSPITAL_COMMUNITY): Payer: Self-pay | Admitting: Internal Medicine

## 2023-06-28 NOTE — Telephone Encounter (Signed)
 Called to confirm/remind patient of their appointment at the [facility] on 06/28/23.   Appointment:   [] Confirmed  [x] Left mess   [] No answer/No voice mail  [] VM Full/unable to leave message  [] Phone not in service  Patient reminded to bring all medications and/or complete list.  Confirmed patient has transportation. Gave directions, instructed to utilize valet parking.

## 2023-07-01 ENCOUNTER — Inpatient Hospital Stay (HOSPITAL_COMMUNITY)
Admission: RE | Admit: 2023-07-01 | Discharge: 2023-07-01 | Disposition: A | Discharge status: Home / Self Care | Source: Ambulatory Visit | Attending: Internal Medicine | Admitting: Internal Medicine

## 2023-07-01 ENCOUNTER — Ambulatory Visit (HOSPITAL_COMMUNITY)
Admission: RE | Admit: 2023-07-01 | Discharge: 2023-07-01 | Disposition: A | Discharge status: Home / Self Care | Source: Ambulatory Visit | Attending: Internal Medicine | Admitting: Internal Medicine

## 2023-07-01 ENCOUNTER — Other Ambulatory Visit (HOSPITAL_COMMUNITY): Payer: Self-pay | Admitting: Internal Medicine

## 2023-07-01 VITALS — BP 120/60 | HR 71 | Wt 180.0 lb

## 2023-07-01 DIAGNOSIS — I5022 Chronic systolic (congestive) heart failure: Secondary | ICD-10-CM | POA: Insufficient documentation

## 2023-07-01 DIAGNOSIS — I493 Ventricular premature depolarization: Secondary | ICD-10-CM | POA: Diagnosis not present

## 2023-07-01 DIAGNOSIS — I255 Ischemic cardiomyopathy: Secondary | ICD-10-CM | POA: Diagnosis not present

## 2023-07-01 DIAGNOSIS — E119 Type 2 diabetes mellitus without complications: Secondary | ICD-10-CM | POA: Diagnosis not present

## 2023-07-01 DIAGNOSIS — Z7984 Long term (current) use of oral hypoglycemic drugs: Secondary | ICD-10-CM | POA: Insufficient documentation

## 2023-07-01 DIAGNOSIS — R0989 Other specified symptoms and signs involving the circulatory and respiratory systems: Secondary | ICD-10-CM | POA: Insufficient documentation

## 2023-07-01 DIAGNOSIS — I6523 Occlusion and stenosis of bilateral carotid arteries: Secondary | ICD-10-CM | POA: Insufficient documentation

## 2023-07-01 DIAGNOSIS — I2510 Atherosclerotic heart disease of native coronary artery without angina pectoris: Secondary | ICD-10-CM | POA: Insufficient documentation

## 2023-07-01 DIAGNOSIS — R9431 Abnormal electrocardiogram [ECG] [EKG]: Secondary | ICD-10-CM | POA: Insufficient documentation

## 2023-07-01 DIAGNOSIS — I509 Heart failure, unspecified: Secondary | ICD-10-CM | POA: Diagnosis present

## 2023-07-01 DIAGNOSIS — I350 Nonrheumatic aortic (valve) stenosis: Secondary | ICD-10-CM | POA: Diagnosis not present

## 2023-07-01 MED ORDER — SPIRONOLACTONE 25 MG PO TABS: 12.5000 mg | ORAL_TABLET | Freq: Every day | ORAL | 3 refills | Status: DC

## 2023-07-01 NOTE — Progress Notes (Addendum)
 [facility] CONSULT NOTE  Referring Provider: Shawnee Dellen NP Primary Care: Charle Congo, MD Primary Cardiologist: Dr. Beau Bound  Chief Complaint: HF  HPI:  [name] is a 73 y/o female with a history of 3v CAD, PVCs, DM2, bilateral carotid stenosis and HFrEF.    Echo 10/07/19: EF 50-55% with mild MR and normal PA pressure Lexiscan 1/22 EF 60% Echo 08/12/22: EF 50-55% with trivial MR Echo 2/25 EF 30-35% with mild AS   Admitted 09/08/22 due to syncope.EMS reports initial blood pressure was in 70s at the scene. She received 1 L LR bolus in ED. trop 28 --> 28, BNP 927, Positive D-dimer 1.38. CT head negative for acute intracranial abnormalities. CTA negative for PE.    Holter monitor applied 08/24 and showed min rate 58, average 80, max 170. Rare bradycardia. Controlled heart rate 99% of time with < 1% tachycardia. PVC burden 11% with 99% of one morphology. <1% SVE burden. No significant sustained runs, pauses, high-grade blocks, ST segment changes. No e/o atiral fibrillation   Underwent cath 01/14/23:    - EF 30%    Ost RCA to Prox RCA lesion is 75% stenosed.   Mid RCA-1 lesion is 95% stenosed.   Mid RCA-2 lesion is 75% stenosed.   RPDA-1 lesion is 100% stenosed.   RPDA-2 lesion is 100% stenosed.   Ost LAD to Prox LAD lesion is 90% stenosed.   Prox LAD to Mid LAD lesion is 100% stenosed.   Mid LAD lesion is 100% stenosed.   Prox RCA lesion is 25% stenosed.   There is moderate to severe left ventricular systolic dysfunction.   LV end diastolic pressure is moderately elevated.   The left ventricular ejection fraction is 25-35% by visual estimate.   cMRI 1/25: EF 26% no LGE   Seen by Dr. Deloise Ferries in 2/24 turned down for CABG due to low EF, comorbidities and lack of suitable LAD target   On 05/15/23 underwent repeat cath with an eye toward PCI of RCA but RCA found to have interim 100% occlusion and could not be crossed. No targets in left system for PCI. LVEDP 28   Here  with her family. Says she gets around the house with walker. No CP or SOB. Not very active though. + LE edema. No orthopnea or PND. Weighs every day. Compliant with meds. Will take extra lsix if weight goes up. Family denies snoring.    Past Medical History:  Diagnosis Date   Acute respiratory failure with hypoxia (HCC) 05/27/2022   Arthritis    hip   CHF (congestive heart failure) (HCC)    Constipation    uses laxatives several times a week   Depression    Diabetes mellitus    takes Metformin  and Amaryl  daily   Dizziness    occasionally and related to meds    Early cataracts, bilateral    History of bronchitis    last time several yrs ago   History of migraine    many yrs ago   Hyperlipidemia    takes Zetia  and Zocor  daily   Hypertension    takes Benazepril  nightly and Propranolol  tid and Clonidine  daily   Insomnia    Low back pain    Peripheral edema    Peripheral neuropathy    PONV (postoperative nausea and vomiting)    Slow urinary stream    occasionally    Current Outpatient Medications  Medication Sig Dispense Refill   acetaminophen  (TYLENOL ) 500 MG tablet Take  500-1,000 mg by mouth See admin instructions. Take 1000 mg in the morning 500 mg at lunch and 1000 mg at bedtime     ascorbic acid  (VITAMIN C ) 500 MG tablet Take 500 mg by mouth daily.     aspirin  81 MG EC tablet Take 81 mg by mouth daily.     Calcium  Carbonate-Vitamin D  (OYSTER SHELL CALCIUM /D PO) Take 2 tablets by mouth 2 (two) times daily.     dextromethorphan  (DELSYM ) 30 MG/5ML liquid Take 30 mg by mouth 2 (two) times daily as needed for cough.     empagliflozin  (JARDIANCE ) 25 MG TABS tablet Take 25 mg by mouth daily.     ezetimibe  (ZETIA ) 10 MG tablet Take 10 mg by mouth at bedtime.     fluticasone  (FLONASE ) 50 MCG/ACT nasal spray Place 2 sprays into both nostrils daily.     gabapentin  (NEURONTIN ) 300 MG capsule Take 300 mg by mouth 3 (three) times daily.     GARLIC  CHOLESTA HEALTH PO Take 5,000 mg by  mouth every evening. Garlique for Cholesterol     GARLIC  PO Take 1,800 mg by mouth daily after breakfast. Garlique Blood pressure formula     GLUCOSAMINE-CHONDROITIN PO Take 1 tablet by mouth 2 (two) times daily. 1500 mg glucosamine 1200 mg Chondroitin     isosorbide mononitrate (IMDUR) 120 MG 24 hr tablet Take 120 mg by mouth daily.     loratadine  (CLARITIN ) 10 MG tablet Take 1 tablet (10 mg total) by mouth daily.     losartan (COZAAR) 25 MG tablet Take 25 mg by mouth daily.     magnesium  oxide (MAG-OX) 400 MG tablet Take 400 mg by mouth 2 (two) times daily.     meclizine (ANTIVERT) 12.5 MG tablet Take 12.5 mg by mouth 2 (two) times daily as needed for dizziness.     metoprolol  succinate (TOPROL -XL) 100 MG 24 hr tablet Take 100 mg by mouth daily.     Multiple Vitamin (MULITIVITAMIN WITH MINERALS) TABS Take 1 tablet by mouth daily after breakfast.      ondansetron  (ZOFRAN ) 4 MG tablet Take 1 tablet (4 mg total) by mouth every 8 (eight) hours as needed for nausea or vomiting. 30 tablet 0   oxyCODONE -acetaminophen  (PERCOCET/ROXICET) 5-325 MG tablet Take 1 tablet by mouth every 6 (six) hours as needed for severe pain (pain score 7-10).     pantoprazole  (PROTONIX ) 40 MG tablet Take 1 tablet (40 mg total) by mouth daily. 30 tablet 2   polyethylene glycol (MIRALAX  / GLYCOLAX ) 17 g packet Take 17 g by mouth daily.     ranolazine (RANEXA) 500 MG 12 hr tablet Take 500 mg by mouth 2 (two) times daily.     rosuvastatin (CRESTOR) 40 MG tablet Take 40 mg by mouth daily.     sennosides-docusate sodium  (SENOKOT-S) 8.6-50 MG tablet Take 1 tablet by mouth 2 (two) times daily.     torsemide  (DEMADEX ) 20 MG tablet Take 20 mg by mouth 2 (two) times daily.     traZODone  (DESYREL ) 50 MG tablet Take 25 mg by mouth at bedtime.     VENTOLIN  HFA 108 (90 Base) MCG/ACT inhaler 1 puff every 4 (four) hours as needed for wheezing or shortness of breath.     zinc  gluconate 50 MG tablet Take 250 mg by mouth daily.     No  current facility-administered medications for this encounter.    Allergies  Allergen Reactions   Naproxen Sodium Rash    anaprox  Social History   Socioeconomic History   Marital status: Widowed    Spouse name: Not on file   Number of children: Not on file   Years of education: Not on file   Highest education level: Not on file  Occupational History   Not on file  Tobacco Use   Smoking status: Never   Smokeless tobacco: Never  Substance and Sexual Activity   Alcohol use: No   Drug use: No   Sexual activity: Never  Other Topics Concern   Not on file  Social History Narrative   Not on file   Social Drivers of Health   Financial Resource Strain: Not on file  Food Insecurity: No Food Insecurity (09/08/2022)   Hunger Vital Sign    Worried About Running Out of Food in the Last Year: Never true    Ran Out of Food in the Last Year: Never true  Transportation Needs: No Transportation Needs (09/08/2022)   PRAPARE - Administrator, Civil Service (Medical): No    Lack of Transportation (Non-Medical): No  Physical Activity: Not on file  Stress: Not on file  Social Connections: Not on file  Intimate Partner Violence: Not At Risk (09/08/2022)   Humiliation, Afraid, Rape, and Kick questionnaire    Fear of Current or Ex-Partner: No    Emotionally Abused: No    Physically Abused: No    Sexually Abused: No      Family History  Problem Relation Age of Onset   Cancer Mother    Stroke Mother    Diabetes Mother    Hypertension Mother    Colon cancer Mother 69       died Aug 07, 2005   Gout Father    Heart disease Father     Vitals:   07/01/23 1447  BP: 120/60  Pulse: 71  SpO2: 94%  Weight: 81.6 kg (180 lb)    PHYSICAL EXAM: General:  Sitting in WC No respiratory difficulty HEENT: normal Neck: supple. no JVD. Carotids 2+ bilat; + R carotid bruit No lymphadenopathy or thryomegaly appreciated. Cor: PMI nondisplaced. Regular rate & rhythm. 2/6 AS Lungs:  clear Abdomen: obese soft, nontender, nondistended. No hepatosplenomegaly. No bruits or masses. Good bowel sounds. Extremities: no cyanosis, clubbing, rash, tr-1+ edema Neuro: alert & oriented x 3, cranial nerves grossly intact. moves all 4 extremities w/o difficulty. Affect pleasant.  ECG: Sinus 72 anterior qs  Personally reviewed   ASSESSMENT & PLAN:  1. Chronic systolic HF due to iCM - Echo 07/24/39: EF 50-55% with mild MR and normal PA pressure - Lexiscan 1/22 EF 60% - Echo 08/12/22: EF 50-55% with trivial MR - cath 11/24 3v CAD EF 30% - cMRI 1/25: EF 26% no LGE  - holter 8/24 11% PVCs  - Echo 2/25 EF 30-35% with mild AS - she has severe cardiomyopathy with severe underlying 3v CAD but cMRI does not show any scar. Has either hibernating CM or possible component of NICM (?PVCs) - wil repeat echo and Zio. If EF still down and PVC burden elevated will consider PVC suppression - NYHA II-III - Volume status ok on torsemide   - Continue Jardiance  25 - Continue losartan 25  - Continue Toprol  100 - Spiro stopped 6/24 as Scr went up to 1.6. Will restart spiro 12.5. Follow labs closely  - Repeat echo 2-3 months to assess need for ICD - Check labs 1 week - Continue to f/u with Dr. Beau Bound  2. 3V CAD - cath 01/14/23:with 3v CAD -  Seen by Dr. Deloise Ferries in 2/24 turned down for CABG due to low EF, comorbidities and lack of suitable LAD target  - Continue medical Rx - no s/s angina 0n ranexa and imdur - Managed by Dr. Beau Bound  3. PVCs  - check Zio - If PVC burden > 10% consider sleep study and PVC suppression   4. Aortic stenosis - mild. Continue to follow  5. R carotid bruit - will need u/s   Jules Oar, MD  2:59 PM

## 2023-07-01 NOTE — Patient Instructions (Addendum)
 Medication Changes:  START Spironolactone  12.5 mg (1/2 tab) Daily  Lab Work:  Your physician recommends that you return for lab work in: 1-2 weeks at [facility] in [place], no appointment is needed Mon-Fri 7:30- 5 pm  Testing/Procedures:  Your provider has recommended that  you wear a Zio Patch for 3 days.  This monitor will record your heart rhythm for our review.  IF you have any symptoms while wearing the monitor please press the button.  If you have any issues with the patch or you notice a red or orange light on it please call the company at [phone].  Once you remove the patch please mail it back to the company as soon as possible so we can get the results.   Special Instructions // Education:  Do the following things EVERYDAY: Weigh yourself in the morning before breakfast. Write it down and keep it in a log. Take your medicines as prescribed Eat low salt foods--Limit salt (sodium) to 2000 mg per day.  Stay as active as you can everyday Limit all fluids for the day to less than 2 liters   Follow-Up in: 1-2 months at our [facility] Location   At the [facility], you and your health needs are our priority. We have a designated team specialized in the treatment of Heart Failure. This Care Team includes your primary Heart Failure Specialized Cardiologist (physician), Advanced Practice Providers (APPs- Physician Assistants and Nurse Practitioners), and Pharmacist who all work together to provide you with the care you need, when you need it.   You may see any of the following providers on your designated Care Team at your next follow up:  Dr. Jules Oar Dr. Peder Bourdon Dr. Alwin Baars Dr. Judyth Nunnery Nieves Bars, NP Ruddy Corral, [place] [facility] [place], [place] Dennise Fitz, NP [place] Lee, NP Luster Salters, PharmD   Please be sure to bring in all your medications bottles to every appointment.   Need to Contact Us :  If you have any  questions or concerns before your next appointment please send us  a message through [place] or call our office at [phone].    TO LEAVE A MESSAGE FOR THE NURSE SELECT OPTION 2, PLEASE LEAVE A MESSAGE INCLUDING: YOUR NAME DATE OF BIRTH CALL BACK NUMBER REASON FOR CALL**this is important as we prioritize the call backs  YOU WILL RECEIVE A CALL BACK THE SAME DAY AS LONG AS YOU CALL BEFORE 4:00 PM

## 2023-07-15 ENCOUNTER — Ambulatory Visit (HOSPITAL_COMMUNITY)
Admission: RE | Admit: 2023-07-15 | Discharge: 2023-07-15 | Disposition: A | Discharge status: Home / Self Care | Source: Ambulatory Visit | Attending: Cardiology | Admitting: Cardiology

## 2023-07-15 ENCOUNTER — Encounter: Payer: Self-pay | Admitting: Podiatry

## 2023-07-15 ENCOUNTER — Ambulatory Visit: Payer: Medicare Other | Admitting: Podiatry

## 2023-07-15 DIAGNOSIS — M79674 Pain in right toe(s): Secondary | ICD-10-CM

## 2023-07-15 DIAGNOSIS — M79675 Pain in left toe(s): Secondary | ICD-10-CM

## 2023-07-15 DIAGNOSIS — E1149 Type 2 diabetes mellitus with other diabetic neurological complication: Secondary | ICD-10-CM | POA: Diagnosis not present

## 2023-07-15 DIAGNOSIS — E1140 Type 2 diabetes mellitus with diabetic neuropathy, unspecified: Secondary | ICD-10-CM | POA: Diagnosis not present

## 2023-07-15 DIAGNOSIS — I5022 Chronic systolic (congestive) heart failure: Secondary | ICD-10-CM | POA: Insufficient documentation

## 2023-07-15 DIAGNOSIS — B351 Tinea unguium: Secondary | ICD-10-CM

## 2023-07-15 LAB — BASIC METABOLIC PANEL WITH GFR
Anion gap: 10 (ref 5–15)
BUN: 28 mg/dL — ABNORMAL HIGH (ref 8–23)
CO2: 30 mmol/L (ref 22–32)
Calcium: 9.9 mg/dL (ref 8.9–10.3)
Chloride: 98 mmol/L (ref 98–111)
Creatinine, Ser: 1.95 mg/dL — ABNORMAL HIGH (ref 0.44–1.00)
GFR, Estimated: 27 mL/min — ABNORMAL LOW (ref >60)
Glucose, Bld: 108 mg/dL — ABNORMAL HIGH (ref 70–99)
Potassium: 4.5 mmol/L (ref 3.5–5.1)
Sodium: 138 mmol/L (ref 135–145)

## 2023-07-15 NOTE — Progress Notes (Signed)
 This patient returns to my office for at risk foot care.  This patient requires this care by a professional since this patient will be at risk due to having diabetes.    This patient is unable to cut nails herself since the patient cannot reach her nails.These nails are painful walking and wearing shoes.  She presents to the office wearing compression socks. The redness has resolved. This patient presents for at risk foot care today.  General Appearance  Alert, conversant and in no acute stress.  Vascular  Dorsalis pedis and posterior tibial  pulses are not  palpable due to swelling bilaterally.  Capillary return is within normal limits  bilaterally. Temperature is within normal limits  bilaterally.  Neurologic  Senn-Weinstein monofilament wire test within normal limits  bilaterally. Muscle power within normal limits bilaterally.  Nails Thick disfigured discolored nails with subungual debris  3-5 left and 2-5 right.. No evidence of bacterial infection or drainage bilaterally.  Orthopedic  No limitations of motion  feet .  No crepitus or effusions noted.  No bony pathology or digital deformities noted.  HAV  B/L. IPJ fusion left foot.  Skin  normotropic skin with no porokeratosis noted bilaterally.  No signs of infections or ulcers noted.   Callus sub 1st MPJ right foot.  Onychomycosis  Pain in right toes  Pain in left toes  Callus sub 1 right foot asymptomatic. Arthritis left hallux.  Consent was obtained for treatment procedures.   Mechanical debridement of nails 1-5  bilaterally performed with a nail nipper.  Filed with dremel without incident.    Return office visit   3 months                   Told patient to return for periodic foot care and evaluation due to potential at risk complications.   Helane Gunther DPM

## 2023-07-17 ENCOUNTER — Encounter: Attending: Cardiovascular Disease | Admitting: *Deleted

## 2023-07-17 ENCOUNTER — Encounter: Payer: Self-pay | Admitting: *Deleted

## 2023-07-17 ENCOUNTER — Other Ambulatory Visit: Payer: Self-pay | Admitting: *Deleted

## 2023-07-17 DIAGNOSIS — I5022 Chronic systolic (congestive) heart failure: Secondary | ICD-10-CM

## 2023-07-17 NOTE — Progress Notes (Signed)
 Virtual orientation call completed today. shehas an appointment on Date: 07/17/2023 for EP eval and gym Orientation.  Documentation of diagnosis can be found in [facility] 05/07/2023 .

## 2023-07-23 ENCOUNTER — Ambulatory Visit

## 2023-08-05 ENCOUNTER — Ambulatory Visit (INDEPENDENT_AMBULATORY_CARE_PROVIDER_SITE_OTHER)

## 2023-08-05 ENCOUNTER — Ambulatory Visit (INDEPENDENT_AMBULATORY_CARE_PROVIDER_SITE_OTHER): Admitting: Podiatry

## 2023-08-05 ENCOUNTER — Encounter: Payer: Self-pay | Admitting: Podiatry

## 2023-08-05 VITALS — Ht 61.0 in | Wt 180.0 lb

## 2023-08-05 DIAGNOSIS — S92351A Displaced fracture of fifth metatarsal bone, right foot, initial encounter for closed fracture: Secondary | ICD-10-CM | POA: Diagnosis not present

## 2023-08-05 DIAGNOSIS — M7751 Other enthesopathy of right foot: Secondary | ICD-10-CM | POA: Diagnosis not present

## 2023-08-05 DIAGNOSIS — M778 Other enthesopathies, not elsewhere classified: Secondary | ICD-10-CM

## 2023-08-08 ENCOUNTER — Telehealth: Payer: Self-pay | Admitting: Podiatry

## 2023-08-08 NOTE — Telephone Encounter (Signed)
 Received surgical consent forms   Left message for pt to call me back directly to get her surgery scheduled.

## 2023-08-09 ENCOUNTER — Telehealth: Payer: Self-pay | Admitting: Internal Medicine

## 2023-08-09 NOTE — Telephone Encounter (Signed)
 Called to confirm/remind patient of their appointment at the [facility] on 08/12/23.   Appointment:   [x] Confirmed  [] Left mess   [] No answer/No voice mail  [] VM Full/unable to leave message  [] Phone not in service  Patient reminded to bring all medications and/or complete list.  Confirmed patient has transportation. Gave directions, instructed to utilize valet parking.

## 2023-08-11 NOTE — Progress Notes (Signed)
 Chief Complaint  Patient presents with   Foot Pain    Pt is here due to right foot pain, she states the pain comes and goes, not hurting at the moment but still wants to get checked out and x-ray.    HPI: 73 y.o. female presenting today for evaluation of right foot pain.  Patient has had pain for about 6 weeks now intermittently.  No recollection of injury to the foot.  She says that she has been resting her foot significantly in the last few days and there is been a significant improvement in the pain.  Past Medical History:  Diagnosis Date   Acute respiratory failure with hypoxia (HCC) 05/27/2022   Arthritis    hip   CHF (congestive heart failure) (HCC)    Constipation    uses laxatives several times a week   Depression    Diabetes mellitus    takes Metformin  and Amaryl  daily   Dizziness    occasionally and related to meds    Early cataracts, bilateral    History of bronchitis    last time several yrs ago   History of migraine    many yrs ago   Hyperlipidemia    takes Zetia  and Zocor  daily   Hypertension    takes Benazepril  nightly and Propranolol  tid and Clonidine  daily   Insomnia    Low back pain    Peripheral edema    Peripheral neuropathy    PONV (postoperative nausea and vomiting)    Slow urinary stream    occasionally    Past Surgical History:  Procedure Laterality Date   COLONOSCOPY     CORONARY BALLOON ANGIOPLASTY N/A 05/15/2023   Procedure: CORONARY BALLOON ANGIOPLASTY;  Surgeon: Wenona Hamilton, MD;  Location: [facility] INVASIVE CV LAB;  Service: Cardiovascular;  Laterality: N/A;   d&c/hysteroscopy/ablation     DILATION AND CURETTAGE OF UTERUS     couple of times   ESOPHAGOGASTRODUODENOSCOPY     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   LEFT HEART CATH AND CORONARY ANGIOGRAPHY Left 01/14/2023   Procedure: LEFT HEART CATH AND CORONARY ANGIOGRAPHY;  Surgeon: Antonette Batters, MD;  Location: [facility] INVASIVE CV LAB;  Service:  Cardiovascular;  Laterality: Left;   LEFT HEART CATH AND CORONARY ANGIOGRAPHY N/A 05/15/2023   Procedure: LEFT HEART CATH AND CORONARY ANGIOGRAPHY;  Surgeon: Wenona Hamilton, MD;  Location: [facility] INVASIVE CV LAB;  Service: Cardiovascular;  Laterality: N/A;   POSTERIOR CERVICAL FUSION/FORAMINOTOMY  10/15/2011   Procedure: POSTERIOR CERVICAL FUSION/FORAMINOTOMY LEVEL 2;  Surgeon: Alphonso Jean, MD;  Location: [facility] OR;  Service: Orthopedics;  Laterality: N/A;  Right C6-7, C7-T1 Foraminotomy with excision HNP C7-T1   UPPER GASTROINTESTINAL ENDOSCOPY      Allergies  Allergen Reactions   Naproxen Sodium Rash    anaprox     Physical Exam: General: The patient is alert and oriented x3 in no acute distress.  Dermatology: Skin is warm, dry and supple bilateral lower extremities.   Vascular: Palpable pedal pulses bilaterally. Capillary refill within normal limits.  No appreciable edema.  No erythema.  Neurological: Grossly intact via light touch  Musculoskeletal Exam: Significant tenderness with palpation noted to the fifth metatarsal tubercle of the right foot  Radiographic Exam RT foot 08/05/2023:  Avulsion type fracture noted to the fifth metatarsal tubercle of the right foot with displacement and approximately 3 mm of gap.  Assessment/Plan of Care: 1.  Displaced fracture fifth metatarsal tubercle right foot  -  Patient evaluated.  X-rays reviewed with the patient in detail explaining the avulsion fracture of the fifth metatarsal tubercle -Given the amount of displacement and gap between the fracture site I do believe that surgical ORIF is appropriate to reduce the fracture.  This was discussed in detail with the patient.  Risk benefits advantages and disadvantages of the procedure were explained in detail.  No guarantees were expressed or implied.  All patient questions were answered.  She consents to have surgery.  Postoperative recovery course was also explained in detail.  She understands that she  will be nonweightbearing approximately 6-8 weeks postoperatively -Authorization for surgery was initiated today.  Surgery will consist of open reduction internal fixation right fifth metatarsal tubercle fracture -Return to clinic 1 week postop.  In the meantime NWB in the cam boot      Dot Gazella, DPM [facility]  Dr. Dot Gazella, DPM    [address] [place], [place] [address]                Office [phone]  Fax [phone]

## 2023-08-12 ENCOUNTER — Ambulatory Visit: Attending: Internal Medicine | Admitting: Internal Medicine

## 2023-08-12 ENCOUNTER — Encounter: Payer: Self-pay | Admitting: Internal Medicine

## 2023-08-12 ENCOUNTER — Ambulatory Visit: Attending: Cardiology | Admitting: Cardiology

## 2023-08-12 VITALS — BP 144/68 | HR 68 | Ht 61.0 in | Wt 177.6 lb

## 2023-08-12 VITALS — BP 124/67 | HR 63 | Wt 177.8 lb

## 2023-08-12 DIAGNOSIS — I493 Ventricular premature depolarization: Secondary | ICD-10-CM | POA: Insufficient documentation

## 2023-08-12 DIAGNOSIS — Z01818 Encounter for other preprocedural examination: Secondary | ICD-10-CM | POA: Insufficient documentation

## 2023-08-12 DIAGNOSIS — Z8249 Family history of ischemic heart disease and other diseases of the circulatory system: Secondary | ICD-10-CM | POA: Diagnosis not present

## 2023-08-12 DIAGNOSIS — I255 Ischemic cardiomyopathy: Secondary | ICD-10-CM | POA: Insufficient documentation

## 2023-08-12 DIAGNOSIS — I350 Nonrheumatic aortic (valve) stenosis: Secondary | ICD-10-CM | POA: Insufficient documentation

## 2023-08-12 DIAGNOSIS — Z79899 Other long term (current) drug therapy: Secondary | ICD-10-CM | POA: Diagnosis not present

## 2023-08-12 DIAGNOSIS — I5022 Chronic systolic (congestive) heart failure: Secondary | ICD-10-CM | POA: Insufficient documentation

## 2023-08-12 DIAGNOSIS — I2510 Atherosclerotic heart disease of native coronary artery without angina pectoris: Secondary | ICD-10-CM | POA: Diagnosis not present

## 2023-08-12 DIAGNOSIS — I5020 Unspecified systolic (congestive) heart failure: Secondary | ICD-10-CM | POA: Insufficient documentation

## 2023-08-12 DIAGNOSIS — E119 Type 2 diabetes mellitus without complications: Secondary | ICD-10-CM | POA: Diagnosis not present

## 2023-08-12 DIAGNOSIS — I110 Hypertensive heart disease with heart failure: Secondary | ICD-10-CM | POA: Insufficient documentation

## 2023-08-12 DIAGNOSIS — Z01810 Encounter for preprocedural cardiovascular examination: Secondary | ICD-10-CM | POA: Diagnosis not present

## 2023-08-12 NOTE — Progress Notes (Signed)
 ADVANCED HF CLINIC NOTE  Referring Provider: Shawnee Dellen NP Primary Care: Charle Congo, MD Primary Cardiologist: Dr. Beau Bound  Chief Complaint: HF  HPI:  Jodi Chavez is a 73 y.o. female with a history of 3v CAD, PVCs, DM2, bilateral carotid stenosis and HFrEF.    Echo 10/07/19: EF 50-55% with mild MR and normal PA pressure Lexiscan 1/22 EF 60% Echo 08/12/22: EF 50-55% with trivial MR Echo 2/25 EF 30-35% with mild AS   Admitted 09/08/22 due to syncope.EMS reports initial blood pressure was in 70s at the scene. She received 1 L LR bolus in ED. trop 28 --> 28, BNP 927, Positive D-dimer 1.38. CT head negative for acute intracranial abnormalities. CTA negative for PE.    Holter monitor applied 08/24 and showed min rate 58, average 80, max 170. Rare bradycardia. Controlled heart rate 99% of time with < 1% tachycardia. PVC burden 11% with 99% of one morphology. <1% SVE burden. No significant sustained runs, pauses, high-grade blocks, ST segment changes. No e/o atiral fibrillation   Underwent cath 01/14/23:    - EF 30%    Ost RCA to Prox RCA lesion is 75% stenosed.   Mid RCA-1 lesion is 95% stenosed.   Mid RCA-2 lesion is 75% stenosed.   RPDA-1 lesion is 100% stenosed.   RPDA-2 lesion is 100% stenosed.   Ost LAD to Prox LAD lesion is 90% stenosed.   Prox LAD to Mid LAD lesion is 100% stenosed.   Mid LAD lesion is 100% stenosed.   Prox RCA lesion is 25% stenosed.   There is moderate to severe left ventricular systolic dysfunction.   LV end diastolic pressure is moderately elevated.   The left ventricular ejection fraction is 25-35% by visual estimate.   cMRI 1/25: EF 26% no LGE   Seen by Dr. Deloise Ferries in 2/24 turned down for CABG due to low EF, comorbidities and lack of suitable LAD target   On 05/15/23 underwent repeat cath with an eye toward PCI of RCA but RCA found to have interim 100% occlusion and could not be crossed. No targets in left system for PCI. LVEDP 28   Zio 4/25 =  4.1% PVCs  Echo 2/25 EF 30-35%   Here with her family. She is doing well though not very active. Family says she sits and reads all day. Only gets up to go to bathroom and eat. Has referral to cardiac rehab but has been holding off due to broken fit. Has seen Podiatry and is pending surgical repair. Denies CP or SOB. Edema well controlled.   Saw EP early today and suggested repeating echo before proceeding with ICD>    Past Medical History:  Diagnosis Date   Acute respiratory failure with hypoxia (HCC) 05/27/2022   Arthritis    hip   CHF (congestive heart failure) (HCC)    Constipation    uses laxatives several times a week   Depression    Diabetes mellitus    takes Metformin  and Amaryl  daily   Dizziness    occasionally and related to meds    Early cataracts, bilateral    History of bronchitis    last time several yrs ago   History of migraine    many yrs ago   Hyperlipidemia    takes Zetia  and Zocor  daily   Hypertension    takes Benazepril  nightly and Propranolol  tid and Clonidine  daily   Insomnia    Low back pain    Peripheral edema  Peripheral neuropathy    PONV (postoperative nausea and vomiting)    Slow urinary stream    occasionally    Current Outpatient Medications  Medication Sig Dispense Refill   acetaminophen  (TYLENOL ) 500 MG tablet Take 500-1,000 mg by mouth See admin instructions. Take 1000 mg in the morning 500 mg at lunch and 1000 mg at bedtime     ascorbic acid  (VITAMIN C ) 500 MG tablet Take 500 mg by mouth daily.     aspirin  81 MG EC tablet Take 81 mg by mouth daily.     Calcium  Carbonate-Vitamin D  (OYSTER SHELL CALCIUM /D PO) Take 2 tablets by mouth 2 (two) times daily.     dextromethorphan  (DELSYM ) 30 MG/5ML liquid Take 30 mg by mouth 2 (two) times daily as needed for cough.     empagliflozin  (JARDIANCE ) 25 MG TABS tablet Take 25 mg by mouth daily.     ezetimibe  (ZETIA ) 10 MG tablet Take 10 mg by mouth at bedtime.     fluticasone  (FLONASE ) 50  MCG/ACT nasal spray Place 2 sprays into both nostrils daily.     gabapentin  (NEURONTIN ) 300 MG capsule Take 300 mg by mouth 3 (three) times daily.     GARLIC  CHOLESTA HEALTH PO Take 5,000 mg by mouth every evening. Garlique for Cholesterol     GARLIC  PO Take 1,800 mg by mouth daily after breakfast. Garlique Blood pressure formula     GLUCOSAMINE-CHONDROITIN PO Take 1 tablet by mouth 2 (two) times daily. 1500 mg glucosamine 1200 mg Chondroitin     isosorbide mononitrate (IMDUR) 120 MG 24 hr tablet Take 120 mg by mouth daily.     loratadine  (CLARITIN ) 10 MG tablet Take 1 tablet (10 mg total) by mouth daily.     losartan (COZAAR) 25 MG tablet Take 25 mg by mouth daily.     magnesium  oxide (MAG-OX) 400 MG tablet Take 400 mg by mouth 2 (two) times daily.     meclizine (ANTIVERT) 12.5 MG tablet Take 12.5 mg by mouth 2 (two) times daily as needed for dizziness.     metoprolol  succinate (TOPROL -XL) 100 MG 24 hr tablet Take 100 mg by mouth daily.     Multiple Vitamin (MULITIVITAMIN WITH MINERALS) TABS Take 1 tablet by mouth daily after breakfast.      ondansetron  (ZOFRAN ) 4 MG tablet Take 1 tablet (4 mg total) by mouth every 8 (eight) hours as needed for nausea or vomiting. 30 tablet 0   oxyCODONE -acetaminophen  (PERCOCET/ROXICET) 5-325 MG tablet Take 1 tablet by mouth every 6 (six) hours as needed for severe pain (pain score 7-10).     pantoprazole  (PROTONIX ) 40 MG tablet Take 1 tablet (40 mg total) by mouth daily. 30 tablet 2   polyethylene glycol (MIRALAX  / GLYCOLAX ) 17 g packet Take 17 g by mouth daily.     ranolazine (RANEXA) 500 MG 12 hr tablet Take 500 mg by mouth 2 (two) times daily.     rosuvastatin (CRESTOR) 40 MG tablet Take 40 mg by mouth daily.     sennosides-docusate sodium  (SENOKOT-S) 8.6-50 MG tablet Take 1 tablet by mouth 2 (two) times daily.     spironolactone  (ALDACTONE ) 25 MG tablet Take 0.5 tablets (12.5 mg total) by mouth daily. 15 tablet 3   torsemide  (DEMADEX ) 20 MG tablet Take 20  mg by mouth 2 (two) times daily.     traZODone  (DESYREL ) 50 MG tablet Take 25 mg by mouth at bedtime.     VENTOLIN  HFA 108 (90 Base) MCG/ACT inhaler 1  puff every 4 (four) hours as needed for wheezing or shortness of breath.     zinc  gluconate 50 MG tablet Take 250 mg by mouth daily.     No current facility-administered medications for this visit.    Allergies  Allergen Reactions   Naproxen Sodium Rash    anaprox      Social History   Socioeconomic History   Marital status: Widowed    Spouse name: Not on file   Number of children: Not on file   Years of education: Not on file   Highest education level: Not on file  Occupational History   Not on file  Tobacco Use   Smoking status: Never   Smokeless tobacco: Never  Vaping Use   Vaping status: Never Used  Substance and Sexual Activity   Alcohol use: No   Drug use: No   Sexual activity: Never  Other Topics Concern   Not on file  Social History Narrative   Not on file   Social Drivers of Health   Financial Resource Strain: Not on file  Food Insecurity: No Food Insecurity (09/08/2022)   Hunger Vital Sign    Worried About Running Out of Food in the Last Year: Never true    Ran Out of Food in the Last Year: Never true  Transportation Needs: No Transportation Needs (09/08/2022)   PRAPARE - Administrator, Civil Service (Medical): No    Lack of Transportation (Non-Medical): No  Physical Activity: Not on file  Stress: Not on file  Social Connections: Not on file  Intimate Partner Violence: Not At Risk (09/08/2022)   Humiliation, Afraid, Rape, and Kick questionnaire    Fear of Current or Ex-Partner: No    Emotionally Abused: No    Physically Abused: No    Sexually Abused: No      Family History  Problem Relation Age of Onset   Cancer Mother    Stroke Mother    Diabetes Mother    Hypertension Mother    Colon cancer Mother 30       died 09-10-05   Gout Father    Heart disease Father     Vitals:    08/12/23 1523  BP: 124/67  Pulse: 63  SpO2: 95%  Weight: 177 lb 12.8 oz (80.6 kg)     PHYSICAL EXAM: General:  Sitting in chair. No resp difficulty HEENT: normal Neck: supple. no JVD. Carotids 2+ bilat; + R bruit.  Cor: Regular rate & rhythm. 2/6 AS Lungs: clear Abdomen: soft, nontender, nondistended. No hepatosplenomegaly. No bruits or masses. Good bowel sounds. Extremities: no cyanosis, clubbing, rash, edema Neuro: alert & orientedx3, cranial nerves grossly intact. moves all 4 extremities w/o difficulty. Affect pleasant   ASSESSMENT & PLAN:  1. Chronic systolic HF due to iCM - Echo 0/86/57: EF 50-55% with mild MR and normal PA pressure - Lexiscan 1/22 EF 60% - Echo 08/12/22: EF 50-55% with trivial MR - cath 11/24 3v CAD EF 30% - cMRI 1/25: EF 26% no LGE  - holter 8/24 11% PVCs  - Echo 2/25 EF 30-35% with mild AS - she has severe cardiomyopathy with severe underlying 3v CAD but cMRI does not show any scar. Has either hibernating CM or possible component of NICM (?PVCs) - wil repeat echo and Zio. If EF still down and PVC burden elevated will consider PVC suppression -> Zio 4/25 = 4.1% PVCs - NYHA II-III but not very active. Encouraged her to walk 5-10 mins 3x/day  in her house - Has referral to CR pending  - Volume status ok on torsemide   - Continue Jardiance  25 - Continue losartan 25  - Continue Toprol  100 - Continue spiro 12.5. - Repeat echo to assess need for ICD (EP note reviewed)  2. 3V CAD - cath 01/14/23:with 3v CAD - Seen by Dr. Deloise Ferries in 2/24 turned down for CABG due to low EF, comorbidities and lack of suitable LAD target  - Continue medical rx - no current angina on ranexa and imdur - Managed by Dr. Beau Bound - Pending CR  3. PVCs  - Zio 4/25 = 4.1% PVCs - not enough to cause CM  4. Aortic stenosis - mild.  - Followed by Dr. Beau Bound  5. Pre-op surgical clearance - she is pending surgical repair of her foot. - given 3vCAD and reduced EF she would  be high risk for General anesthesia - Would favor conscious sedation with LE block - I sent a message to Dr. Luster Salters (her podiatrist) - Dr. Beau Bound also following  Jules Oar, MD  5:54 PM

## 2023-08-12 NOTE — Progress Notes (Signed)
 Electrophysiology Clinic Note    Date:  08/12/2023  Patient ID:  Jodi Chavez, Jodi Chavez 03-07-1950, MRN [id number] PCP:  Charle Congo, MD  Cardiologist:  None HF cardiologist: Bens[place] Electrophysiologist: Boyce Byes, MD   Discussed the use of AI scribe software for clinical note transcription with the patient, who gave verbal consent to proceed.   Patient Profile    Chief Complaint: HFrEF  History of Present Illness: Jodi Chavez is a 73 y.o. female with PMH notable for HFrEF, ICM, CAD, PVC; seen today for Boyce Byes, MD for routine electrophysiology followup.   She last saw Dr. Marven Slimmer 04/2023 at which time she was awaiting recommendations regarding her severe coronary artery disease.  Since that time, cardiothoracic surgery did not recommend CABG, Dr. Alvenia Aus attempted angioplasty to RCA that was unsuccessful and recommended medical management.  She saw Dr. Julane Ny 06/2023 who restarted spiro and planned for updated TTE in 2-3 months to assess LVEF and need for ICD.  On follow-up today, she is doing well from a cardiac standpoint.  She recently broke her foot and is awaiting surgery on that.  She has occasional chest discomfort described as uncomfortable and weird that have previously been associated with PVCs on ZIO monitor.  These episodes are not associated with chest pain, chest pressure, SOB, and they happen infrequently now that she has been on renexa.    Arrhythmia/Device History No specialty comments available.    ROS:  Please see the history of present illness. All other systems are reviewed and otherwise negative.    Physical Exam    VS:  BP (!) 144/68 (BP Location: Right Arm, Patient Position: Sitting, Cuff Size: Normal)   Pulse 68   Ht 5' 1 (1.549 m)   Wt 177 lb 9.6 oz (80.6 kg)   SpO2 95%   BMI 33.56 kg/m  BMI: Body mass index is 33.56 kg/m.  Wt Readings from Last 3 Encounters:  08/12/23 177 lb 12.8 oz (80.6 kg)  08/12/23 177 lb  9.6 oz (80.6 kg)  08/05/23 180 lb (81.6 kg)     GEN- The patient is well appearing, alert and oriented x 3 today.   Lungs- Clear to ausculation bilaterally, normal work of breathing.  Heart- Regular rate and rhythm, no murmurs, rubs or gallops Extremities- Trace peripheral edema, warm, dry    Studies Reviewed   Previous EP, cardiology notes.    EKG is not ordered. Personal review of EKG from 07/01/2023 shows:  SR at 72bpm QRS 108        Long term monitor, 07/22/2023 1. Sinus rhythm -  avg HR of 69 bpm.  2. One run of Supraventricular Tachycardia occurred lasting 12 beats with a max rate of 148 bpm (avg 128 bpm).  3. PVCs were occasional (4.1%, 6781). 4. Ventricular Bigeminy and Trigeminy were present. 5. No patient-triggered events or diary entries present.  LHC, Coronary balloon angioplasty, 05/15/2023 1. Sinus rhythm -  avg HR of 69 bpm.  2. One run of Supraventricular Tachycardia occurred lasting 12 beats with a max rate of 148 bpm (avg 128 bpm).  3. PVCs were occasional (4.1%, 6781). 4. Ventricular Bigeminy and Trigeminy were present. 5. No patient-triggered events or diary entries present.  TTE, 04/24/2023  1. Left ventricular ejection fraction, by estimation, is 30 to 35%. The left ventricle has moderately decreased function. The left ventricular internal cavity size was dilated. Left ventricular diastolic parameters are consistent with Grade I diastolic dysfunction (impaired relaxation).  Elevated left atrial pressure. The E/e' is 44. Global and regional wall motion abnormalities - The mid and distal anterior wall, entire septum, and mid and distal inferior wall are hypokinetic.   2. Right ventricular systolic function is mildly reduced. The right ventricular size is normal. There is normal pulmonary artery systolic pressure. The estimated right ventricular systolic pressure is 16.57mmHg.   3. The mitral valve is degenerative. Mild to moderate mitral valve regurgitation. No  evidence of mitral stenosis. Moderate to severe mitral annular calcification.   4. The aortic valve is tricuspid. Aortic valve regurgitation is not visualized. Aortic valve sclerosis is present, with no evidence of aortic valve stenosis (peak velocity 1.3cm2, MG , AVA VTI 1.23cm2, DI 0.44, SVi 28, valve planimetry 1.46cm2).   5. The inferior vena cava is normal in size with greater than 50% respiratory variability, suggesting right atrial pressure of 3 mmHg.   Comparison(s): A prior study was performed on 08/12/2022. LVEF has reduced from 50-55% to 30-35%. Moderate AS was reported on prior study but not re-illustrated on current study.     Assessment and Plan     #) HFrEF  #) ICM Appears warm and dry on exam  She has follow-up later this afternoon with Dr. Julane Ny Will discuss again with her feels that she maximally tolerated GDMT  We discussed ICD implant procedure, she prefers to have it done in [place] if possible.   #) PVCs Recent zio monitor with PVC burden ~4% Continue 100mg  toprol  daily, 500mg  renexa BID       Current medicines are reviewed at length with the patient today.   The patient does not have concerns regarding her medicines.  The following changes were made today:  none  Labs/ tests ordered today include:  No orders of the defined types were placed in this encounter.    Disposition: Follow up with Dr. Marven Slimmer or EP APP as usual post procedure   I spent 25 minutes caring for this patient today including 20 minutes in face-to-face explaining ICD implant procedure and rationale and an additional 5 minutes in coordinating care  Signed, Yaseen Gilberg, NP  08/12/23  4:23 PM  Electrophysiology [facility]

## 2023-08-12 NOTE — Patient Instructions (Signed)
 Medication Instructions:  The current medical regimen is effective;  continue present plan and medications as directed. Please refer to the Current Medication list given to you today.   *If you need a refill on your cardiac medications before your next appointment, please call your pharmacy*   Follow-Up: At [name], you and your health needs are our priority.  As part of our continuing mission to provide you with exceptional heart care, our providers are all part of one team.  This team includes your primary Cardiologist (physician) and Advanced Practice Providers or APPs (Physician Assistants and Nurse Practitioners) who all work together to provide you with the care you need, when you need it.  Your next appointment:   We will call for further update and plan.  We recommend signing up for the patient portal called MyChart.  Sign up information is provided on this After Visit Summary.  MyChart is used to connect with patients for Virtual Visits (Telemedicine).  Patients are able to view lab/test results, encounter notes, upcoming appointments, etc.  Non-urgent messages can be sent to your provider as well.   To learn more about what you can do with MyChart, go to [email or web address].

## 2023-08-12 NOTE — Patient Instructions (Addendum)
 Medication Changes:  None, continue current medications  Lab Work:  None today  Testing/Procedures:  Your physician has requested that you have an echocardiogram. Echocardiography is a painless test that uses sound waves to create images of your heart. It provides your doctor with information about the size and shape of your heart and how well your heart's chambers and valves are working. This procedure takes approximately one hour. There are no restrictions for this procedure. Please do NOT wear cologne, perfume, aftershave, or lotions (deodorant is allowed). Please arrive 15 minutes prior to your appointment time.  Please note: We ask at that you not bring children with you during ultrasound (echo/ vascular) testing. Due to room size and safety concerns, children are not allowed in the ultrasound rooms during exams. Our front office staff cannot provide observation of children in our lobby area while testing is being conducted. An adult accompanying a patient to their appointment will only be allowed in the ultrasound room at the discretion of the ultrasound technician under special circumstances. We apologize for any inconvenience.   Special Instructions // Education:  Do the following things EVERYDAY: Weigh yourself in the morning before breakfast. Write it down and keep it in a log. Take your medicines as prescribed Eat low salt foods--Limit salt (sodium) to 2000 mg per day.  Stay as active as you can everyday Limit all fluids for the day to less than 2 liters  **Please walk 5-10 minutes three times a day**   Follow-Up in: 4 months (October), we will call you closer to this time to schedule    If you have any questions or concerns before your next appointment please send us  a message through [facility] or call our office at [phone], If it is after office hours your call will be answered by our answering service and directed appropriately.     At the [facility], you and your health needs are our priority. We have a designated team specialized in the treatment of Heart Failure. This Care Team includes your primary Heart Failure Specialized Cardiologist (physician), Advanced Practice Providers (APPs- Physician Assistants and Nurse Practitioners), and Pharmacist who all work together to provide you with the care you need, when you need it.   You may see any of the following providers on your designated Care Team at your next follow up:  Dr. Jules Oar Dr. Peder Bourdon Dr. Alwin Baars Dr. Judyth Nunnery Nieves Bars, NP Ruddy Corral, [place] [address] [place], [place] Dennise Fitz, NP [place] Lee, NP Shawnee Dellen, NP Bevely Brush, PharmD

## 2023-08-16 ENCOUNTER — Telehealth: Payer: Self-pay | Admitting: Cardiology

## 2023-08-16 NOTE — Telephone Encounter (Signed)
 Attempted to call patient to follow-up on recommendations regarding delaying ICD implant until after her foot surgery from Dr. Marven Slimmer and Dr. Julane Ny.  No answer left voicemail.

## 2023-08-19 NOTE — Telephone Encounter (Signed)
 Pt is requesting a callback before wanting to schedule her 3 month f/u. Please advise.

## 2023-08-19 NOTE — Telephone Encounter (Signed)
 Called pt and left a message  for a 3 month follow up

## 2023-08-19 NOTE — Telephone Encounter (Signed)
 Called patient to follow up on her questions - advised patient that the providers wanted to wait until after her foot surgery for the ICD, patient has ECHO scheduled for 8/19 that she will keep the appointment  Patient will call to schedule follow up once she has schedule for foot surgery

## 2023-08-27 ENCOUNTER — Encounter: Payer: Self-pay | Admitting: Podiatry

## 2023-09-19 ENCOUNTER — Telehealth: Payer: Self-pay | Admitting: Podiatry

## 2023-09-19 NOTE — Telephone Encounter (Signed)
 PER CYNTHIA AT [facility] SHE IS CANCELING PTS SURGERY AT THE SURGERY CENTER DUE TO ANESTHESIA NOTE ATTACHED/ SURGERY WAS SCHEDULED [facility] 7/31 Needs anesthesia approval, cardiology records attached - Jamie Roof (09/18/2023 14:02) Scheduled for repeat echocardiogram 10/15/2023 prior to ICD - Warren Roof (09/18/2023 14:50) PMH severe CAD/CHF unable to stent or graft and still still having chest pain weekly. Her LVEF has dropped from 55% to 35% since last year in the setting of mild Aortic stenosis. ECG- PVCs, bigeminy, and trigemeny are present and she is awaiting AICD placement. Additional co-mordity includes bilateral carotid stenosis. Surgery time 90 minutes. She is high risk and should have surgery at a hospital. - Lonni Gin (09/18/2023 14:59)

## 2023-09-25 ENCOUNTER — Telehealth: Payer: Self-pay | Admitting: Podiatry

## 2023-09-25 NOTE — Telephone Encounter (Signed)
 Spoke to Dr Janit and he recommended since surgery is not going to be done at [facility] to have pt follow up with him and go from there.  Pt is scheduled to see Dr Janit 8/4 ( pt req Monday appt)

## 2023-09-25 NOTE — Telephone Encounter (Signed)
 Patient states she has trouble with nausea. Asks can she take Zofran  the morning of surgery.

## 2023-09-30 ENCOUNTER — Ambulatory Visit (INDEPENDENT_AMBULATORY_CARE_PROVIDER_SITE_OTHER)

## 2023-09-30 ENCOUNTER — Ambulatory Visit (INDEPENDENT_AMBULATORY_CARE_PROVIDER_SITE_OTHER): Admitting: Podiatry

## 2023-09-30 ENCOUNTER — Encounter: Payer: Self-pay | Admitting: Podiatry

## 2023-09-30 VITALS — Ht 61.0 in | Wt 177.8 lb

## 2023-09-30 DIAGNOSIS — S92351A Displaced fracture of fifth metatarsal bone, right foot, initial encounter for closed fracture: Secondary | ICD-10-CM

## 2023-09-30 NOTE — Progress Notes (Signed)
 Chief Complaint  Patient presents with   Fracture    Pt is here due to fracture in the right foot and discuss surgery options.    HPI: 73 y.o. female presenting today for follow-up evaluation of a fracture to the fifth metatarsal right foot.  Patient states that since she was last seen in the office on 08/05/2023 the pain has improved significantly.  She says it is only tender occasionally.  Originally we had set up for surgery to the right foot but she is mostly asymptomatic currently  Past Medical History:  Diagnosis Date   Acute respiratory failure with hypoxia (HCC) 05/27/2022   Arthritis    hip   CHF (congestive heart failure) (HCC)    Constipation    uses laxatives several times a week   Depression    Diabetes mellitus    takes Metformin  and Amaryl  daily   Dizziness    occasionally and related to meds    Early cataracts, bilateral    History of bronchitis    last time several yrs ago   History of migraine    many yrs ago   Hyperlipidemia    takes Zetia  and Zocor  daily   Hypertension    takes Benazepril  nightly and Propranolol  tid and Clonidine  daily   Insomnia    Low back pain    Peripheral edema    Peripheral neuropathy    PONV (postoperative nausea and vomiting)    Slow urinary stream    occasionally    Past Surgical History:  Procedure Laterality Date   COLONOSCOPY     CORONARY BALLOON ANGIOPLASTY N/A 05/15/2023   Procedure: CORONARY BALLOON ANGIOPLASTY;  Surgeon: Darron Deatrice LABOR, MD;  Location: [facility];  Service: Cardiovascular;  Laterality: N/A;   d&c/hysteroscopy/ablation     DILATION AND CURETTAGE OF UTERUS     couple of times   ESOPHAGOGASTRODUODENOSCOPY     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   LEFT HEART CATH AND CORONARY ANGIOGRAPHY Left 01/14/2023   Procedure: LEFT HEART CATH AND CORONARY ANGIOGRAPHY;  Surgeon: Florencio Cara BIRCH, MD;  Location: [facility];  Service: Cardiovascular;  Laterality: Left;    LEFT HEART CATH AND CORONARY ANGIOGRAPHY N/A 05/15/2023   Procedure: LEFT HEART CATH AND CORONARY ANGIOGRAPHY;  Surgeon: Darron Deatrice LABOR, MD;  Location: [facility];  Service: Cardiovascular;  Laterality: N/A;   POSTERIOR CERVICAL FUSION/FORAMINOTOMY  10/15/2011   Procedure: POSTERIOR CERVICAL FUSION/FORAMINOTOMY LEVEL 2;  Surgeon: Lynwood FORBES Better, MD;  Location: [facility];  Service: Orthopedics;  Laterality: N/A;  Right C6-7, C7-T1 Foraminotomy with excision HNP C7-T1   UPPER GASTROINTESTINAL ENDOSCOPY      Allergies  Allergen Reactions   Naproxen Sodium Rash    anaprox     Physical Exam: General: The patient is alert and oriented x3 in no acute distress.  Dermatology: Skin is warm, dry and supple bilateral lower extremities.   Vascular: Palpable pedal pulses bilaterally. Capillary refill within normal limits.  No appreciable edema.  No erythema.  Neurological: Grossly intact via light touch  Musculoskeletal Exam: Today there is minimal tenderness with palpation along the fifth metatarsal tubercle of the right foot.  Radiographic Exam RT foot 09/30/2023:  Stable.  Avulsion type fracture noted to the fifth metatarsal tubercle of the right foot with displacement and approximately 3 mm of gap.  Assessment/Plan of Care: 1.  Displaced fracture fifth metatarsal tubercle right foot; stable  -Patient evaluated.   -  X-rays reviewed in detail and compared to prior x-rays -Since the patient is mostly asymptomatic and she runs moderate risk with surgery I would recommend conservative treatment for now -Continue wearing good supportive tennis shoes and sneakers.  Refrain from going barefoot -Return to clinic PRN     Thresa EMERSON Sar, DPM [facility]  Dr. Thresa EMERSON Sar, DPM    [address] [place], [place] [address]                Office [phone]  Fax [phone]

## 2023-10-02 ENCOUNTER — Encounter: Admitting: Podiatry

## 2023-10-09 ENCOUNTER — Encounter: Admitting: Podiatry

## 2023-10-14 ENCOUNTER — Ambulatory Visit (INDEPENDENT_AMBULATORY_CARE_PROVIDER_SITE_OTHER): Admitting: Podiatry

## 2023-10-14 ENCOUNTER — Encounter: Payer: Self-pay | Admitting: Podiatry

## 2023-10-14 DIAGNOSIS — M79675 Pain in left toe(s): Secondary | ICD-10-CM

## 2023-10-14 DIAGNOSIS — L84 Corns and callosities: Secondary | ICD-10-CM

## 2023-10-14 DIAGNOSIS — M79674 Pain in right toe(s): Secondary | ICD-10-CM

## 2023-10-14 DIAGNOSIS — E1140 Type 2 diabetes mellitus with diabetic neuropathy, unspecified: Secondary | ICD-10-CM | POA: Diagnosis not present

## 2023-10-14 DIAGNOSIS — B351 Tinea unguium: Secondary | ICD-10-CM

## 2023-10-14 DIAGNOSIS — E1149 Type 2 diabetes mellitus with other diabetic neurological complication: Secondary | ICD-10-CM

## 2023-10-14 NOTE — Progress Notes (Signed)
 This patient returns to my office for at risk foot care.  This patient requires this care by a professional since this patient will be at risk due to having diabetes.    This patient is unable to cut nails herself since the patient cannot reach her nails.These nails are painful walking and wearing shoes.  She presents to the office wearing compression socks. The redness has resolved. This patient presents for at risk foot care today.  General Appearance  Alert, conversant and in no acute stress.  Vascular  Dorsalis pedis and posterior tibial  pulses are not  palpable due to swelling bilaterally.  Capillary return is within normal limits  bilaterally. Temperature is within normal limits  bilaterally.  Neurologic  Senn-Weinstein monofilament wire test within normal limits  bilaterally. Muscle power within normal limits bilaterally.  Nails Thick disfigured discolored nails with subungual debris  3-5 left and 2-5 right.. No evidence of bacterial infection or drainage bilaterally.  Orthopedic  No limitations of motion  feet .  No crepitus or effusions noted.  No bony pathology or digital deformities noted.  HAV  B/L. IPJ fusion left foot.  Skin  normotropic skin with no porokeratosis noted bilaterally.  No signs of infections or ulcers noted.   Callus sub 1st MPJ right foot.  Onychomycosis  Pain in right toes  Pain in left toes  Callus sub 1 right foot asymptomatic. Arthritis left hallux.  Consent was obtained for treatment procedures.   Mechanical debridement of nails 1-5  bilaterally performed with a nail nipper.  Filed with dremel without incident.    Return office visit   3 months                   Told patient to return for periodic foot care and evaluation due to potential at risk complications.   Helane Gunther DPM

## 2023-10-15 ENCOUNTER — Ambulatory Visit
Admission: RE | Admit: 2023-10-15 | Discharge: 2023-10-15 | Disposition: A | Discharge status: Home / Self Care | Source: Ambulatory Visit | Attending: Internal Medicine | Admitting: Internal Medicine

## 2023-10-15 DIAGNOSIS — I340 Nonrheumatic mitral (valve) insufficiency: Secondary | ICD-10-CM | POA: Insufficient documentation

## 2023-10-15 DIAGNOSIS — I110 Hypertensive heart disease with heart failure: Secondary | ICD-10-CM | POA: Insufficient documentation

## 2023-10-15 DIAGNOSIS — I5020 Unspecified systolic (congestive) heart failure: Secondary | ICD-10-CM | POA: Diagnosis present

## 2023-10-15 DIAGNOSIS — I3481 Nonrheumatic mitral (valve) annulus calcification: Secondary | ICD-10-CM | POA: Diagnosis not present

## 2023-10-15 LAB — ECHOCARDIOGRAM COMPLETE
AR max vel: 2.4 cm2
AV Area VTI: 2.3 cm2
AV Area mean vel: 2.18 cm2
AV Mean grad: 4.5 mmHg
AV Peak grad: 7.7 mmHg
Ao pk vel: 1.39 m/s
Area-P 1/2: 3.85 cm2
Calc EF: 36 %
MV VTI: 2.46 cm2
S' Lateral: 4.2 cm
Single Plane A2C EF: 39.3 %
Single Plane A4C EF: 33.8 %

## 2023-10-15 NOTE — Progress Notes (Signed)
*  PRELIMINARY RESULTS* Echocardiogram 2D Echocardiogram has been performed.  Jodi Chavez 10/15/2023, 9:49 AM

## 2023-10-23 ENCOUNTER — Encounter: Admitting: Podiatry

## 2023-10-24 ENCOUNTER — Other Ambulatory Visit (HOSPITAL_COMMUNITY): Payer: Self-pay | Admitting: Internal Medicine

## 2023-11-14 ENCOUNTER — Ambulatory Visit: Admitting: Podiatry

## 2023-11-18 ENCOUNTER — Ambulatory Visit: Admitting: Podiatry

## 2023-11-18 ENCOUNTER — Encounter: Payer: Self-pay | Admitting: Podiatry

## 2023-11-18 ENCOUNTER — Encounter: Payer: Self-pay | Admitting: Cardiology

## 2023-11-18 ENCOUNTER — Ambulatory Visit: Attending: Cardiology | Admitting: Cardiology

## 2023-11-18 VITALS — BP 120/60 | HR 70 | Ht 61.0 in | Wt 179.0 lb

## 2023-11-18 DIAGNOSIS — E119 Type 2 diabetes mellitus without complications: Secondary | ICD-10-CM | POA: Diagnosis not present

## 2023-11-18 DIAGNOSIS — I493 Ventricular premature depolarization: Secondary | ICD-10-CM | POA: Diagnosis present

## 2023-11-18 DIAGNOSIS — L84 Corns and callosities: Secondary | ICD-10-CM

## 2023-11-18 DIAGNOSIS — I255 Ischemic cardiomyopathy: Secondary | ICD-10-CM | POA: Diagnosis present

## 2023-11-18 DIAGNOSIS — M2011 Hallux valgus (acquired), right foot: Secondary | ICD-10-CM

## 2023-11-18 DIAGNOSIS — I5020 Unspecified systolic (congestive) heart failure: Secondary | ICD-10-CM | POA: Insufficient documentation

## 2023-11-18 NOTE — Progress Notes (Signed)
 Electrophysiology Clinic Note    Date:  11/18/2023  Patient ID:  Jodi Chavez, Jodi Chavez March 25, 1950, MRN [id number] PCP:  Shelda Atlas, MD  Cardiologist:  Cara JONETTA Lovelace, MD HF Cardiologist - Bensimhon   Electrophysiologist:  CAMERON T LAMBERT, MD  Electrophysiology APP:  Charissa Knowles, NP    Discussed the use of AI scribe software for clinical note transcription with the patient, who gave verbal consent to proceed.   Patient Profile    Chief Complaint: ICM, HFrEF  History of Present Illness: Jodi Chavez is a 73 y.o. female with PMH notable for HFrEF, ICM, CAD, PVC, T2DM ; seen today for Jodi ONEIDA HOLTS, MD for routine electrophysiology followup.    She last saw Dr. HOLTS 04/2023 at which time she was awaiting recommendations regarding her severe coronary artery disease.  Since that time, cardiothoracic surgery did not recommend CABG, Dr. Darron attempted angioplasty to RCA that was unsuccessful and recommended medical management.   I last saw her 07/2023 after updated TTE showed reduced LVEF on max tolerated GDMT. At that visit, she was awaiting surgery for broken foot. We discussed ICD, recommended to wait until after ortho surgery. She has since seen podiatry, who recommended conservative treatment given elevated surgical risk.   On follow-up today, she is doing well. She walks with walker at home without pain in her R foot. She denies chest pain, chest pressure, palpitations. She has history of syncope but has not passed out in several years. She denies dizziness or LH.  She has intermittent lower extremity edema, elevates her legs throughout the day.    Arrhythmia/Device History No specialty comments available.    ROS:  Please see the history of present illness. All other systems are reviewed and otherwise negative.    Physical Exam    VS:  BP 120/60   Pulse 70   Ht 5' 1 (1.549 m)   Wt 179 lb (81.2 kg)   SpO2 92%   BMI 33.82 kg/m  BMI: Body mass index  is 33.82 kg/m.      Wt Readings from Last 3 Encounters:  11/18/23 179 lb (81.2 kg)  09/30/23 177 lb 12.8 oz (80.6 kg)  08/12/23 177 lb 12.8 oz (80.6 kg)     GEN- The patient is well appearing, alert and oriented x 3 today.   Lungs- Clear to ausculation bilaterally, normal work of breathing.  Heart- Regular rate and rhythm, no murmurs, rubs or gallops Extremities- Trace peripheral edema, warm, dry   Studies Reviewed   Previous EP, cardiology notes.    EKG is ordered. Personal review of EKG from today shows:    EKG Interpretation Date/Time:  Monday November 18 2023 14:19:55 EDT Ventricular Rate:  70 PR Interval:  156 QRS Duration:  112 QT Interval:  422 QTC Calculation: 455 R Axis:   41  Text Interpretation: Normal sinus rhythm Confirmed by Matthan Sledge [phone]) on 11/18/2023 2:22:22 PM    TTE, 10/15/2023  1. Left ventricular ejection fraction, by estimation, is 30 to 35%. The left ventricle has moderately decreased function. The left ventricle demonstrates regional wall motion abnormalities (see scoring diagram/findings for description). Left ventricular diastolic parameters are consistent with Grade II diastolic dysfunction (pseudonormalization). Elevated left atrial pressure.   2. Right ventricular systolic function is normal. The right ventricular size is normal. Tricuspid regurgitation signal is inadequate for assessing PA pressure.   3. The mitral valve is degenerative. Mild mitral valve regurgitation. No evidence of mitral stenosis. Severe  mitral annular calcification.   4. The aortic valve is tricuspid. There is mild thickening of the aortic valve. Aortic valve regurgitation is not visualized. Aortic valve sclerosis is present, with no evidence of aortic valve stenosis.   5. The inferior vena cava is normal in size with greater than 50% respiratory variability, suggesting right atrial pressure of 3 mmHg.   Comparison(s): A prior study was performed on 04/24/2023. The  left ventricular function is unchanged.   Long term monitor, 07/22/2023 1. Sinus rhythm -  avg HR of 69 bpm.  2. One run of Supraventricular Tachycardia occurred lasting 12 beats with a max rate of 148 bpm (avg 128 bpm).  3. PVCs were occasional (4.1%, 6781). 4. Ventricular Bigeminy and Trigeminy were present. 5. No patient-triggered events or diary entries present.   LHC, Coronary balloon angioplasty, 05/15/2023 1. Sinus rhythm -  avg HR of 69 bpm.  2. One run of Supraventricular Tachycardia occurred lasting 12 beats with a max rate of 148 bpm (avg 128 bpm).  3. PVCs were occasional (4.1%, 6781). 4. Ventricular Bigeminy and Trigeminy were present. 5. No patient-triggered events or diary entries present.   TTE, 04/24/2023  1. Left ventricular ejection fraction, by estimation, is 30 to 35%. The left ventricle has moderately decreased function. The left ventricular internal cavity size was dilated. Left ventricular diastolic parameters are consistent with Grade I diastolic dysfunction (impaired relaxation). Elevated left atrial pressure. The E/e' is 43. Global and regional wall motion abnormalities - The mid and distal anterior wall, entire septum, and mid and distal inferior wall are hypokinetic.   2. Right ventricular systolic function is mildly reduced. The right ventricular size is normal. There is normal pulmonary artery systolic pressure. The estimated right ventricular systolic pressure is 16.72mmHg.   3. The mitral valve is degenerative. Mild to moderate mitral valve regurgitation. No evidence of mitral stenosis. Moderate to severe mitral annular calcification.   4. The aortic valve is tricuspid. Aortic valve regurgitation is not visualized. Aortic valve sclerosis is present, with no evidence of aortic valve stenosis (peak velocity 1.3cm2, MG , AVA VTI 1.23cm2, DI 0.44, SVi 28, valve planimetry 1.46cm2).   5. The inferior vena cava is normal in size with greater than 50% respiratory  variability, suggesting right atrial pressure of 3 mmHg.   Comparison(s): A prior study was performed on 08/12/2022. LVEF has reduced from 50-55% to 30-35%. Moderate AS was reported on prior study but not re-illustrated on current study.    Assessment and Plan     #) HFrEF #) ICM Ongoing reduced LVEF Previously deferred ICD implant d/t needing foot surgery, this is now being managed non-operatively She walks around home with assistance from walker Meets criteria for ICD implant, will discuss timing with Dr. Cindie Abide regularly with HF team Continue 25mg  jardiance , 25mg  losartan, 100mg  toprol , 12.5mg  spiro Continue 20mg  torsemide  BID  #) PVCs No PVCs on today's EKG or rhythm strip Continue toprol  as above Continue 500mg  renexa BID      Current medicines are reviewed at length with the patient today.   The patient does not have concerns regarding her medicines.  The following changes were made today:  none  Labs/ tests ordered today include:  Orders Placed This Encounter  Procedures   EKG 12-Lead     Disposition: Follow up with Dr. Cindie or EP APP as usual post procedure   Signed, Chantal Needle, NP  11/18/23  3:42 PM  Electrophysiology [facility]

## 2023-11-18 NOTE — Patient Instructions (Signed)
 Medication Instructions:  Your physician recommends that you continue on your current medications as directed. Please refer to the Current Medication list given to you today.  *If you need a refill on your cardiac medications before your next appointment, please call your pharmacy*  Lab Work: No labs ordered today  If you have labs (blood work) drawn today and your tests are completely normal, you will receive your results only by: MyChart Message (if you have MyChart) OR A paper copy in the mail If you have any lab test that is abnormal or we need to change your treatment, we will call you to review the results.  Testing/Procedures: No test ordered today   Follow-Up: At [facility], you and your health needs are our priority.  As part of our continuing mission to provide you with exceptional heart care, our providers are all part of one team.  This team includes your primary Cardiologist (physician) and Advanced Practice Providers or APPs (Physician Assistants and Nurse Practitioners) who all work together to provide you with the care you need, when you need it.  Your next appointment:    Follow up after procedure   Provider:   You will see one of the following Advanced Practice Providers on your designated Care Team:   Lonni Meager, NP Lesley Maffucci, PA-C Bernardino Bring, PA-C Cadence Winfield, PA-C Tylene Lunch, NP Barnie Hila, NP Dr. Cindie   We recommend signing up for the patient portal called MyChart.  Sign up information is provided on this After Visit Summary.  MyChart is used to connect with patients for Virtual Visits (Telemedicine).  Patients are able to view lab/test results, encounter notes, upcoming appointments, etc.  Non-urgent messages can be sent to your provider as well.   To learn more about what you can do with MyChart, go to [email or web address].

## 2023-11-18 NOTE — Progress Notes (Signed)
 This patient presents to the office with chief complaint of bleeding from callus right forefoot.  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  She was concerned about this area last week due to seeing blood.  She also says it was extremely painful when walking.  She presents for evaluation and treatment.  General Appearance  Alert, conversant and in no acute stress.  Vascular  Dorsalis pedis and posterior tibial  pulses are palpable  bilaterally.  Capillary return is within normal limits  bilaterally. Temperature is within normal limits  bilaterally.  Neurologic  Senn-Weinstein monofilament wire test within normal limits  bilaterally. Muscle power within normal limits bilaterally.  Nails Thick disfigured discolored nails with subungual debris  from hallux to fifth toes bilaterally. No evidence of bacterial infection or drainage bilaterally.  Orthopedic  No limitations of motion  feet .  No crepitus or effusions noted.  No bony pathology or digital deformities noted. HAV  B/L.  Skin  normotropic skin with no porokeratosis noted bilaterally.  No signs of infections or ulcers noted.   Skin has thickened plantar to the HAV 1st MPJ right foot.Dry lood noted but no redness or drainage noted.  Skin if folded causing pain to be presents.  Callus right fpot due to HAV  Debride callus with # 15 blade and dremel tool.  DSD applied.  Padding added to shoes.  RTC 2 months for nail care.  Cordella Bold DPM

## 2023-11-25 ENCOUNTER — Telehealth: Payer: Self-pay | Admitting: Internal Medicine

## 2023-12-11 ENCOUNTER — Telehealth: Payer: Self-pay

## 2023-12-11 NOTE — Telephone Encounter (Signed)
 Left message for patient to call back to schedule an appointment with Dr. Cindie to discuss ICD.

## 2023-12-11 NOTE — Telephone Encounter (Signed)
Spoke with the patient and scheduled an appointment with Dr. Lalla Brothers.

## 2023-12-11 NOTE — Telephone Encounter (Signed)
 Patient is returning call. Please advise?

## 2023-12-25 ENCOUNTER — Ambulatory Visit: Admitting: Cardiology

## 2023-12-27 ENCOUNTER — Telehealth: Payer: Self-pay | Admitting: Internal Medicine

## 2023-12-27 NOTE — Telephone Encounter (Signed)
 Called to confirm/remind patient of their appointment at [facility] on 12/30/[facility].   Appointment:   [] Confirmed  [x] Left mess   [] No answer/No voice mail  [] VM Full/unable to leave message  [] Phone not in service  Patient reminded to bring all medications and/or complete list.  Confirmed patient has transportation. Gave directions, instructed to utilize valet parking.

## 2023-12-30 ENCOUNTER — Ambulatory Visit: Attending: Internal Medicine | Admitting: Internal Medicine

## 2023-12-30 ENCOUNTER — Encounter: Payer: Self-pay | Admitting: Internal Medicine

## 2023-12-30 VITALS — BP 125/61 | HR 70 | Wt 181.4 lb

## 2023-12-30 DIAGNOSIS — I493 Ventricular premature depolarization: Secondary | ICD-10-CM | POA: Insufficient documentation

## 2023-12-30 DIAGNOSIS — I130 Hypertensive heart and chronic kidney disease with heart failure and stage 1 through stage 4 chronic kidney disease, or unspecified chronic kidney disease: Secondary | ICD-10-CM | POA: Insufficient documentation

## 2023-12-30 DIAGNOSIS — Z7984 Long term (current) use of oral hypoglycemic drugs: Secondary | ICD-10-CM | POA: Diagnosis not present

## 2023-12-30 DIAGNOSIS — I5020 Unspecified systolic (congestive) heart failure: Secondary | ICD-10-CM

## 2023-12-30 DIAGNOSIS — N184 Chronic kidney disease, stage 4 (severe): Secondary | ICD-10-CM | POA: Insufficient documentation

## 2023-12-30 DIAGNOSIS — I6523 Occlusion and stenosis of bilateral carotid arteries: Secondary | ICD-10-CM | POA: Insufficient documentation

## 2023-12-30 DIAGNOSIS — E119 Type 2 diabetes mellitus without complications: Secondary | ICD-10-CM | POA: Diagnosis not present

## 2023-12-30 DIAGNOSIS — I5022 Chronic systolic (congestive) heart failure: Secondary | ICD-10-CM | POA: Insufficient documentation

## 2023-12-30 DIAGNOSIS — I350 Nonrheumatic aortic (valve) stenosis: Secondary | ICD-10-CM | POA: Insufficient documentation

## 2023-12-30 DIAGNOSIS — I2510 Atherosclerotic heart disease of native coronary artery without angina pectoris: Secondary | ICD-10-CM | POA: Insufficient documentation

## 2023-12-30 DIAGNOSIS — I255 Ischemic cardiomyopathy: Secondary | ICD-10-CM | POA: Insufficient documentation

## 2023-12-30 NOTE — Progress Notes (Signed)
 ADVANCED HF CLINIC NOTE  Referring Provider: Ellouise Class NP Primary Care: Jodi Atlas, MD Primary Cardiologist: Dr. Florencio  Chief Complaint: HF  HPI:  [name] is a 73 y.o. female with a history of 3v CAD, PVCs, DM2, bilateral carotid stenosis and HFrEF.    Echo 10/07/19: EF 50-55% with mild MR and normal PA pressure Lexiscan 1/22 EF 60% Echo 08/12/22: EF 50-55% with trivial MR Echo 2/25 EF 30-35% with mild AS   Admitted 09/08/22 due to syncope.EMS reports initial blood pressure was in 70s at the scene. She received 1 L LR bolus in ED. trop 28 --> 28, BNP 927, Positive D-dimer 1.38. CT head negative for acute intracranial abnormalities. CTA negative for PE.    Holter monitor applied 08/24 and showed min rate 58, average 80, max 170. Rare bradycardia. Controlled heart rate 99% of time with < 1% tachycardia. PVC burden 11% with 99% of one morphology. <1% SVE burden. No significant sustained runs, pauses, high-grade blocks, ST segment changes. No e/o atiral fibrillation   Underwent cath 01/14/23:    - EF 30%    Ost RCA to Prox RCA lesion is 75% stenosed.   Mid RCA-1 lesion is 95% stenosed.   Mid RCA-2 lesion is 75% stenosed.   RPDA-1 lesion is 100% stenosed.   RPDA-2 lesion is 100% stenosed.   Ost LAD to Prox LAD lesion is 90% stenosed.   Prox LAD to Mid LAD lesion is 100% stenosed.   Mid LAD lesion is 100% stenosed.   Prox RCA lesion is 25% stenosed.   There is moderate to severe left ventricular systolic dysfunction.   LV end diastolic pressure is moderately elevated.   The left ventricular ejection fraction is 25-35% by visual estimate.   cMRI 1/25: EF 26% no LGE   Seen by Dr. Shyrl in 2/24 turned down for CABG due to low EF, comorbidities and lack of suitable LAD target   On 05/15/23 underwent repeat cath with an eye toward PCI of RCA but RCA found to have interim 100% occlusion and could not be crossed. No targets in left system for PCI. LVEDP 28   Zio 4/25 =  4.1% PVCs  Echo 2/25 EF 30-35%   Here with her family. She is doing well though not very active. Family said she sits and reads a lot but can get up and do ADLs. Trying to walk more but hip hurts. No CP, edema, orthopnea or PND.   Past Medical History:  Diagnosis Date   Acute respiratory failure with hypoxia (HCC) 05/27/2022   Arthritis    hip   CHF (congestive heart failure) (HCC)    Constipation    uses laxatives several times a week   Depression    Diabetes mellitus    takes Metformin  and Amaryl  daily   Dizziness    occasionally and related to meds    Early cataracts, bilateral    History of bronchitis    last time several yrs ago   History of migraine    many yrs ago   Hyperlipidemia    takes Zetia  and Zocor  daily   Hypertension    takes Benazepril  nightly and Propranolol  tid and Clonidine  daily   Insomnia    Low back pain    Peripheral edema    Peripheral neuropathy    PONV (postoperative nausea and vomiting)    Slow urinary stream    occasionally    Current Outpatient Medications  Medication Sig Dispense Refill  acetaminophen  (TYLENOL ) 500 MG tablet Take 500-1,000 mg by mouth See admin instructions. Take 1000 mg in the morning 500 mg at lunch and 1000 mg at bedtime     ascorbic acid  (VITAMIN C ) 500 MG tablet Take 500 mg by mouth daily.     aspirin  81 MG EC tablet Take 81 mg by mouth daily.     Calcium  Carbonate-Vitamin D  (OYSTER SHELL CALCIUM /D PO) Take 2 tablets by mouth 2 (two) times daily.     dextromethorphan  (DELSYM ) 30 MG/5ML liquid Take 30 mg by mouth 2 (two) times daily as needed for cough.     empagliflozin  (JARDIANCE ) 25 MG TABS tablet Take 25 mg by mouth daily.     ezetimibe  (ZETIA ) 10 MG tablet Take 10 mg by mouth at bedtime.     fluticasone  (FLONASE ) 50 MCG/ACT nasal spray Place 2 sprays into both nostrils daily.     gabapentin  (NEURONTIN ) 300 MG capsule Take 300 mg by mouth 3 (three) times daily.     GARLIC  CHOLESTA HEALTH PO Take 5,000 mg by mouth  every evening. Garlique for Cholesterol     GARLIC  PO Take 1,800 mg by mouth daily after breakfast. Garlique Blood pressure formula     GLUCOSAMINE-CHONDROITIN PO Take 1 tablet by mouth 2 (two) times daily. 1500 mg glucosamine 1200 mg Chondroitin     isosorbide mononitrate (IMDUR) 120 MG 24 hr tablet Take 120 mg by mouth daily.     loratadine  (CLARITIN ) 10 MG tablet Take 1 tablet (10 mg total) by mouth daily.     losartan (COZAAR) 25 MG tablet Take 25 mg by mouth daily.     magnesium  oxide (MAG-OX) 400 MG tablet Take 400 mg by mouth 2 (two) times daily.     meclizine (ANTIVERT) 12.5 MG tablet Take 12.5 mg by mouth 2 (two) times daily as needed for dizziness.     metoprolol  succinate (TOPROL -XL) 100 MG 24 hr tablet Take 100 mg by mouth daily.     Multiple Vitamin (MULITIVITAMIN WITH MINERALS) TABS Take 1 tablet by mouth daily after breakfast.      pantoprazole  (PROTONIX ) 40 MG tablet Take 1 tablet (40 mg total) by mouth daily. 30 tablet 2   polyethylene glycol (MIRALAX  / GLYCOLAX ) 17 g packet Take 17 g by mouth daily.     ranolazine (RANEXA) 500 MG 12 hr tablet Take 500 mg by mouth 2 (two) times daily.     rosuvastatin (CRESTOR) 40 MG tablet Take 40 mg by mouth daily.     sennosides-docusate sodium  (SENOKOT-S) 8.6-50 MG tablet Take 1 tablet by mouth 2 (two) times daily.     spironolactone  (ALDACTONE ) 25 MG tablet Take 1/2 (one-half) tablet by mouth once daily 45 tablet 3   torsemide  (DEMADEX ) 20 MG tablet Take 20 mg by mouth 2 (two) times daily.     traZODone  (DESYREL ) 50 MG tablet Take 25 mg by mouth at bedtime.     VENTOLIN  HFA 108 (90 Base) MCG/ACT inhaler 1 puff every 4 (four) hours as needed for wheezing or shortness of breath.     zinc  gluconate 50 MG tablet Take 250 mg by mouth daily.     oxyCODONE -acetaminophen  (PERCOCET/ROXICET) 5-325 MG tablet Take 1 tablet by mouth every 6 (six) hours as needed for severe pain (pain score 7-10). (Patient not taking: Reported on 12/30/2023)     No  current facility-administered medications for this visit.    Allergies  Allergen Reactions   Naproxen Sodium Rash    anaprox  Social History   Socioeconomic History   Marital status: Widowed    Spouse name: Not on file   Number of children: Not on file   Years of education: Not on file   Highest education level: Not on file  Occupational History   Not on file  Tobacco Use   Smoking status: Never   Smokeless tobacco: Never  Vaping Use   Vaping status: Never Used  Substance and Sexual Activity   Alcohol use: No   Drug use: No   Sexual activity: Never  Other Topics Concern   Not on file  Social History Narrative   Not on file   Social Drivers of Health   Financial Resource Strain: Not on file  Food Insecurity: No Food Insecurity (09/08/2022)   Hunger Vital Sign    Worried About Running Out of Food in the Last Year: Never true    Ran Out of Food in the Last Year: Never true  Transportation Needs: No Transportation Needs (09/08/2022)   PRAPARE - Administrator, Civil Service (Medical): No    Lack of Transportation (Non-Medical): No  Physical Activity: Not on file  Stress: Not on file  Social Connections: Not on file  Intimate Partner Violence: Not At Risk (09/08/2022)   Humiliation, Afraid, Rape, and Kick questionnaire    Fear of Current or Ex-Partner: No    Emotionally Abused: No    Physically Abused: No    Sexually Abused: No      Family History  Problem Relation Age of Onset   Cancer Mother    Stroke Mother    Diabetes Mother    Hypertension Mother    Colon cancer Mother 61       died 16-Jan-2006   Gout Father    Heart disease Father     Vitals:   12/30/23 1524  BP: 125/61  Pulse: 70  SpO2: 93%  Weight: 181 lb 6 oz (82.3 kg)     PHYSICAL EXAM: General:  Sitting in chair. No resp difficulty HEENT: normal Neck: supple. no JVD.  Cor: Regular rate & rhythm. Soft SEM RSB  Lungs: clear Abdomen: soft, nontender, nondistended.Good bowel  sounds. Extremities: no cyanosis, clubbing, rash, edema Neuro: alert & orientedx3, cranial nerves grossly intact. moves all 4 extremities w/o difficulty. Affect pleasant    ASSESSMENT & PLAN:  1. Chronic systolic HF due to iCM - Echo 1/88/78: EF 50-55% with mild MR and normal PA pressure - Lexiscan 1/22 EF 60% - Echo 08/12/22: EF 50-55% with trivial MR - cath 11/24 3v CAD EF 30% - cMRI 1/25: EF 26% no LGE  - holter 8/24 11% PVCs  - Echo 2/25 EF 30-35% with mild AS - Echo 8/25 EF 30-35% G2DD mild MR - she has severe cardiomyopathy with severe underlying 3v CAD but cMRI does not show any scar. Has either hibernating CM or possible component of NICM (?PVCs) - Zio 4/25 = 4.1% PVCs - NYHA II-III but not very active. Re-encouraged her to walk 5-10 mins 3x/day in her house - Has referral to CR pending but hasn't gone - trying to work out insurance - Volume status ok on torsemide   - Continue Jardiance  25 - Continue losartan 25  - Continue Toprol  100 - Continue spiro 12.5. - Following with EP for ICD placement - sees Dr. Cindie on 11/12  - check labs  2. 3V CAD - cath 01/14/23:with 3v CAD - Seen by Dr. Shyrl in 2/24 turned down for CABG due  to low EF, comorbidities and lack of suitable LAD target  - No s/s angina  - Continue ranexa and imdur - Managed by Dr. Florencio - Pending CR  3. PVCs  - Zio 4/25 = 4.1% PVCs - not enough to cause CM  4. Aortic stenosis - mild on echo  - Followed by Dr. Florencio  5. CKD 3b-IV - continue ARB and SGLT2i  - check labs   Jodi Fuel, MD  3:45 PM

## 2023-12-30 NOTE — Patient Instructions (Addendum)
 Medication Changes:  No medication changes today!  Lab Work:   Go downstairs to [facility] on [address] to have your blood work completed.  We will only call you if the results are abnormal or if the provider would like to make medication changes.  No news is good news.'   Follow-Up in: Please follow up with the [facility] in 6 months with Dr. Cherrie. We do not currently have that schedule. Please give us  a call in April in order to schedule your appointment for May 2026.   Thank you for choosing [facility].    At the [facility], you and your health needs are our priority. We have a designated team specialized in the treatment of Heart Failure. This Care Team includes your primary Heart Failure Specialized Cardiologist (physician), Advanced Practice Providers (APPs- Physician Assistants and Nurse Practitioners), and Pharmacist who all work together to provide you with the care you need, when you need it.   You may see any of the following providers on your designated Care Team at your next follow up:  Dr. Toribio Cherrie Dr. Ezra Shuck Dr. Ria Commander Dr. Morene Brownie Ellouise Class, FNP Jaun Bash, RPH-CPP  Please be sure to bring in all your medications bottles to every appointment.   Need to Contact Us :  If you have any questions or concerns before your next appointment please send us  a message through [place] or call our office at [phone].    TO LEAVE A MESSAGE FOR THE NURSE SELECT OPTION 2, PLEASE LEAVE A MESSAGE INCLUDING: YOUR NAME DATE OF BIRTH CALL BACK NUMBER REASON FOR CALL**this is important as we prioritize the call backs  YOU WILL RECEIVE A CALL BACK THE SAME DAY AS LONG AS YOU CALL BEFORE 4:00 PM

## 2023-12-30 NOTE — Addendum Note (Signed)
 Addended by: SHARL GRATE A on: 12/30/2023 04:07 PM   Modules accepted: Orders

## 2023-12-31 LAB — BASIC METABOLIC PANEL WITH GFR
BUN/Creatinine Ratio: 16 (ref 12–28)
BUN: 29 mg/dL — ABNORMAL HIGH (ref 8–27)
CO2: 26 mmol/L (ref 20–29)
Calcium: 10.3 mg/dL (ref 8.7–10.3)
Chloride: 95 mmol/L — ABNORMAL LOW (ref 96–106)
Creatinine, Ser: 1.81 mg/dL — ABNORMAL HIGH (ref 0.57–1.00)
Glucose: 134 mg/dL — ABNORMAL HIGH (ref 70–99)
Potassium: 4.9 mmol/L (ref 3.5–5.2)
Sodium: 137 mmol/L (ref 134–144)
eGFR: 29 mL/min/1.73 — ABNORMAL LOW (ref >59)

## 2023-12-31 LAB — BRAIN NATRIURETIC PEPTIDE: BNP: 172.2 pg/mL — ABNORMAL HIGH (ref 0.0–100.0)

## 2024-01-06 NOTE — Progress Notes (Deleted)
  Electrophysiology Office Follow up Visit Note:    Date:  01/06/2024   ID:  Jodi Chavez, DOB 11-14-1950, MRN [id number]  PCP:  Shelda Atlas, MD  [facility] Cardiologist:  Cara JONETTA Lovelace, MD  [facility] Electrophysiologist:  [name], MD    Interval History:     Jodi Chavez is a 73 y.o. female who presents for a follow up visit.   The patient was most recently seen by [facility] November 18, 2023.  She has a history of ischemic cardiomyopathy, chronic systolic heart failure, diabetes.  I saw her in March.  She saw cardiothoracic surgery after the last appointment with me who did not recommend bypass surgery.  Dr. Darron took her for left heart catheterization.  During that procedure angioplasty to the RCA was attempted but was not successful.  She sees the heart failure team.  She saw Dr. Cherrie on December 30, 2023.        Past medical, surgical, social and family history were reviewed.  ROS:   Please see the history of present illness.    All other systems reviewed and are negative.  EKGs/Labs/Other Studies Reviewed:    The following studies were reviewed today:  October 15, 2023 echo EF 30-35 Moderately reduced LV function RV normal Mild MR        Physical Exam:    VS:  There were no vitals taken for this visit.    Wt Readings from Last 3 Encounters:  12/30/23 181 lb 6 oz (82.3 kg)  11/18/23 179 lb (81.2 kg)  09/30/23 177 lb 12.8 oz (80.6 kg)     GEN: no distress CARD: RRR, No MRG RESP: No IWOB. CTAB.      ASSESSMENT:    No diagnosis found. PLAN:    In order of problems listed above:  #Coronary artery disease #Chronic systolic heart failure #Ischemic cardiomyopathy The patient has persistently reduced LV function.  An ICD is indicated.  I discussed the ICD implant procedure in detail the patient including the risks and she wishes to proceed.  She will need a single-chamber ICD.  Her QRS is narrow, 110 ms.  The patient  has an ischemic ischemic CM (EF 30%), NYHA Class III CHF, and CAD.  He is referred by Dr Bensimohn for risk stratification of sudden death and consideration of ICD implantation.  At this time, she meets criteria for ICD implantation for primary prevention of sudden death.  I have had a thorough discussion with the patient reviewing options.  The patient and their family (if available) have had opportunities to ask questions and have them answered. The patient and I have decided together through a shared decision making process to proceed with ICD implant at this time.    Risks, benefits, alternatives to ICD implantation were discussed in detail with the patient today. The patient understands that the risks include but are not limited to bleeding, infection, pneumothorax, perforation, tamponade, vascular damage, renal failure, MI, stroke, death, inappropriate shocks, and lead dislodgement and wishes to proceed.  We will therefore schedule device implantation at the next available time.  She will continue her aspirin  uninterrupted around the time of the ICD implant procedure.    Signed, Ole Holts, MD, [facility], [facility] 01/06/2024 12:57 PM    Electrophysiology [facility]

## 2024-01-08 ENCOUNTER — Ambulatory Visit: Admitting: Cardiology

## 2024-01-08 DIAGNOSIS — I5020 Unspecified systolic (congestive) heart failure: Secondary | ICD-10-CM

## 2024-01-08 DIAGNOSIS — I255 Ischemic cardiomyopathy: Secondary | ICD-10-CM

## 2024-01-13 ENCOUNTER — Encounter: Payer: Self-pay | Admitting: Podiatry

## 2024-01-13 ENCOUNTER — Ambulatory Visit: Admitting: Podiatry

## 2024-01-13 DIAGNOSIS — B351 Tinea unguium: Secondary | ICD-10-CM | POA: Diagnosis not present

## 2024-01-13 DIAGNOSIS — M79675 Pain in left toe(s): Secondary | ICD-10-CM

## 2024-01-13 DIAGNOSIS — L84 Corns and callosities: Secondary | ICD-10-CM

## 2024-01-13 DIAGNOSIS — E1149 Type 2 diabetes mellitus with other diabetic neurological complication: Secondary | ICD-10-CM | POA: Diagnosis not present

## 2024-01-13 DIAGNOSIS — M79674 Pain in right toe(s): Secondary | ICD-10-CM | POA: Diagnosis not present

## 2024-01-13 DIAGNOSIS — E1140 Type 2 diabetes mellitus with diabetic neuropathy, unspecified: Secondary | ICD-10-CM

## 2024-01-13 NOTE — Progress Notes (Signed)
 This patient returns to my office for at risk foot care.  This patient requires this care by a professional since this patient will be at risk due to having diabetes.    This patient is unable to cut nails herself since the patient cannot reach her nails.These nails are painful walking and wearing shoes.  She presents to the office wearing compression socks. This patient presents for at risk foot care today.  General Appearance  Alert, conversant and in no acute stress.  Vascular  Dorsalis pedis and posterior tibial  pulses are not  palpable due to swelling bilaterally.  Capillary return is within normal limits  bilaterally. Temperature is within normal limits  bilaterally.  Neurologic  Senn-Weinstein monofilament wire test within normal limits  bilaterally. Muscle power within normal limits bilaterally.  Nails Thick disfigured discolored nails with subungual debris  3-5 left and 2-5 right.. No evidence of bacterial infection or drainage bilaterally.  Orthopedic  No limitations of motion  feet .  No crepitus or effusions noted.  No bony pathology or digital deformities noted.  HAV  B/L. IPJ fusion left foot.  Skin  normotropic skin with no porokeratosis noted bilaterally.  No signs of infections or ulcers noted.   Callus sub 1st MPJ right foot.  Onychomycosis  Pain in right toes  Pain in left toes  Callus sub 1 right foot symptomatic.   Consent was obtained for treatment procedures.   Mechanical debridement of nails 1-5  bilaterally performed with a nail nipper.  Filed with dremel without incident.    Return office visit   3 months                   Told patient to return for periodic foot care and evaluation due to potential at risk complications.   Cordella Bold DPM

## 2024-01-20 ENCOUNTER — Observation Stay
Admission: EM | Admit: 2024-01-20 | Discharge: 2024-01-21 | Disposition: A | Attending: Obstetrics and Gynecology | Admitting: Obstetrics and Gynecology

## 2024-01-20 DIAGNOSIS — Z79899 Other long term (current) drug therapy: Secondary | ICD-10-CM | POA: Insufficient documentation

## 2024-01-20 DIAGNOSIS — Z7982 Long term (current) use of aspirin: Secondary | ICD-10-CM | POA: Diagnosis not present

## 2024-01-20 DIAGNOSIS — R55 Syncope and collapse: Secondary | ICD-10-CM | POA: Diagnosis present

## 2024-01-20 DIAGNOSIS — I10 Essential (primary) hypertension: Secondary | ICD-10-CM | POA: Diagnosis present

## 2024-01-20 DIAGNOSIS — I5022 Chronic systolic (congestive) heart failure: Secondary | ICD-10-CM | POA: Insufficient documentation

## 2024-01-20 DIAGNOSIS — N1832 Chronic kidney disease, stage 3b: Secondary | ICD-10-CM | POA: Diagnosis not present

## 2024-01-20 DIAGNOSIS — I130 Hypertensive heart and chronic kidney disease with heart failure and stage 1 through stage 4 chronic kidney disease, or unspecified chronic kidney disease: Secondary | ICD-10-CM | POA: Insufficient documentation

## 2024-01-20 DIAGNOSIS — F32A Depression, unspecified: Secondary | ICD-10-CM | POA: Diagnosis present

## 2024-01-20 DIAGNOSIS — I255 Ischemic cardiomyopathy: Secondary | ICD-10-CM | POA: Insufficient documentation

## 2024-01-20 DIAGNOSIS — E1122 Type 2 diabetes mellitus with diabetic chronic kidney disease: Secondary | ICD-10-CM | POA: Insufficient documentation

## 2024-01-20 LAB — TROPONIN T, HIGH SENSITIVITY
Troponin T High Sensitivity: 33 ng/L — ABNORMAL HIGH (ref 0–19)
Troponin T High Sensitivity: 30 ng/L — ABNORMAL HIGH (ref 0–19)

## 2024-01-20 LAB — URINALYSIS, ROUTINE W REFLEX MICROSCOPIC
Bacteria, UA: NONE SEEN
Bilirubin Urine: NEGATIVE
Glucose, UA: >=500 mg/dL — AB
Hgb urine dipstick: NEGATIVE
Ketones, ur: NEGATIVE mg/dL
Leukocytes,Ua: NEGATIVE
Nitrite: NEGATIVE
Protein, ur: NEGATIVE mg/dL
Specific Gravity, Urine: 1.005 (ref 1.005–1.030)
pH: 7 (ref 5.0–8.0)

## 2024-01-20 LAB — COMPREHENSIVE METABOLIC PANEL WITH GFR
ALT: 20 U/L (ref 0–44)
AST: 27 U/L (ref 15–41)
Albumin: 4.1 g/dL (ref 3.5–5.0)
Alkaline Phosphatase: 72 U/L (ref 38–126)
Anion gap: 14 (ref 5–15)
BUN: 25 mg/dL — ABNORMAL HIGH (ref 8–23)
CO2: 26 mmol/L (ref 22–32)
Calcium: 9.7 mg/dL (ref 8.9–10.3)
Chloride: 97 mmol/L — ABNORMAL LOW (ref 98–111)
Creatinine, Ser: 1.69 mg/dL — ABNORMAL HIGH (ref 0.44–1.00)
GFR, Estimated: 32 mL/min — ABNORMAL LOW (ref >60)
Glucose, Bld: 154 mg/dL — ABNORMAL HIGH (ref 70–99)
Potassium: 3.9 mmol/L (ref 3.5–5.1)
Sodium: 137 mmol/L (ref 135–145)
Total Bilirubin: 0.3 mg/dL (ref 0.0–1.2)
Total Protein: 6.8 g/dL (ref 6.5–8.1)

## 2024-01-20 LAB — CBC
HCT: 39 % (ref 36.0–46.0)
Hemoglobin: 13.1 g/dL (ref 12.0–15.0)
MCH: 33.3 pg (ref 26.0–34.0)
MCHC: 33.6 g/dL (ref 30.0–36.0)
MCV: 99.2 fL (ref 80.0–100.0)
Platelets: 180 K/uL (ref 150–400)
RBC: 3.93 MIL/uL (ref 3.87–5.11)
RDW: 13.2 % (ref 11.5–15.5)
WBC: 8.8 K/uL (ref 4.0–10.5)
nRBC: 0 % (ref 0.0–0.2)

## 2024-01-20 MED ORDER — LACTATED RINGERS IV BOLUS
1000.0000 mL | Freq: Once | INTRAVENOUS | Status: AC
  Administered 2024-01-20: 1000 mL via INTRAVENOUS

## 2024-01-20 NOTE — ED Provider Notes (Signed)
 11:09 PM  Assumed care at shift change.  Patient here for syncopal event.  Cardiac enzymes mildly elevated but flat.  Urine pending.  12:45 AM  Pt's urine shows no sign of infection.  On reevaluation patient reports she has had near syncopal events before but has never passed out before.  She states that she has been told she may need a defibrillator because of her CHF.  EF of 30 to 35% on most recent echocardiogram.  We discussed that she has high risk for arrhythmia.  Discussed the possibility of admission to the hospital for observation versus discharge with close outpatient follow-up with Dr. Cherrie who is her cardiologist.  She states because of the holiday she does not think she would be able to be seen quickly and would prefer admission to the hospital for monitoring.  I feel this is reasonable.  She is not having any chest pain or shortness of breath currently.  No current events seen on cardiac monitoring.   1:00 AM  Consulted and discussed patient's case with hospitalist, Dr. Cleatus.  I have recommended admission and consulting physician agrees and will place admission orders.  Patient (and family if present) agree with this plan.   I reviewed all nursing notes, vitals, pertinent previous records.  All labs, EKGs, imaging ordered have been independently reviewed and interpreted by myself.    Kayda Allers, Josette SAILOR, DO 01/21/24 [phone]

## 2024-01-20 NOTE — ED Triage Notes (Signed)
 Pt to ED BIB [facility] with c/o syncopal episode while at the dinner table tonight. Pt reports she was eating when she suddenly began feeling queezy and then passed out. Family lowered pt to the ground and reports pt was unconscious for approx 2-3 minutes. No hx of syncopal episodes. Denies any seizure like acitivty. Pt A&Ox4, denies pain, reports nausea and feeling weak.

## 2024-01-20 NOTE — ED Provider Notes (Signed)
 [facility] Provider Note    Event Date/Time   First MD Initiated Contact with Patient 01/20/24 2014     (approximate)  History   Chief Complaint: Loss of Consciousness  HPI  Jodi Chavez is a 73 y.o. female with a past medical history of CHF, diabetes, hyperlipidemia, hypertension, presents to the emergency department after a syncopal episode.  According to the patient she was eating dinner tonight when she began feeling very nauseated.  States she had a brief syncopal versus near syncopal event.  Per EMS family reported that they lowered her down to the ground patient appeared to completely lose consciousness for up to about 2 minutes.  No history of syncopal events previously per EMS however patient states she believes last year she had a syncopal event as well.  No seizure activity reported.  Patient denies any chest pain or shortness of breath.  Denies any recent vomiting or diarrhea although admits to feeling nauseated before this event occurred.  Blood pressure on arrival is 117/42.  Patient is awake alert oriented answering questions appropriately.  Physical Exam   Triage Vital Signs: ED Triage Vitals  Encounter Vitals Group     BP 01/20/24 1952 (!) 117/42     Girls Systolic BP Percentile --      Girls Diastolic BP Percentile --      Boys Systolic BP Percentile --      Boys Diastolic BP Percentile --      Pulse Rate 01/20/24 1952 67     Resp 01/20/24 1952 16     Temp 01/20/24 1952 97.7 F (36.5 C)     Temp Source 01/20/24 1952 Oral     SpO2 01/20/24 1952 94 %     Weight 01/20/24 1958 182 lb 15.7 oz (83 kg)     Height 01/20/24 1958 5' 1 (1.549 m)     Head Circumference --      Peak Flow --      Pain Score --      Pain Loc --      Pain Education --      Exclude from Growth Chart --     Most recent vital signs: Vitals:   01/20/24 1952  BP: (!) 117/42  Pulse: 67  Resp: 16  Temp: 97.7 F (36.5 C)  SpO2: 94%    General: Awake, no  distress.  CV:  Good peripheral perfusion.  Regular rate and rhythm  Resp:  Normal effort.  Equal breath sounds bilaterally.  Abd:  No distention.  Soft, nontender.  No rebound or guarding.  Benign abdomen.  ED Results / Procedures / Treatments   EKG  EKG viewed and interpreted by myself shows a normal sinus rhythm at 67 bpm with a narrow QRS, right axis deviation, largely normal intervals with nonspecific ST changes.   MEDICATIONS ORDERED IN ED: Medications - No data to display   IMPRESSION / MDM / ASSESSMENT AND PLAN / ED COURSE  I reviewed the triage vital signs and the nursing notes.  Patient's presentation is most consistent with acute presentation with potential threat to life or bodily function.  Patient presents emergency department for a syncopal episode while eating dinner.  Patient states she began feeling very nauseated while eating dinner and then had a syncopal event.  EMS per family reports approximately 2 minutes before the patient regained consciousness.  We will check labs including CBC chemistry troponin x 2.  Will IV hydrate continue to closely monitor.  Repeat troponin is unchanged.  Urinalysis pending.  Patient care signed out to oncoming provider.  Anticipate likely discharge home given reassuring CBC chemistry and slightly elevated likely due to CKD but unchanged troponin x 2.  FINAL CLINICAL IMPRESSION(S) / ED DIAGNOSES   Syncope    Note:  This document was prepared using Dragon voice recognition software and may include unintentional dictation errors.   Dorothyann Drivers, MD 01/20/24 [phone]

## 2024-01-21 ENCOUNTER — Encounter: Payer: Self-pay | Admitting: Internal Medicine

## 2024-01-21 ENCOUNTER — Other Ambulatory Visit: Payer: Self-pay

## 2024-01-21 ENCOUNTER — Emergency Department

## 2024-01-21 DIAGNOSIS — R55 Syncope and collapse: Secondary | ICD-10-CM | POA: Diagnosis not present

## 2024-01-21 DIAGNOSIS — I255 Ischemic cardiomyopathy: Secondary | ICD-10-CM | POA: Insufficient documentation

## 2024-01-21 DIAGNOSIS — I5022 Chronic systolic (congestive) heart failure: Secondary | ICD-10-CM | POA: Insufficient documentation

## 2024-01-21 LAB — CBC
HCT: 38 % (ref 36.0–46.0)
Hemoglobin: 12.7 g/dL (ref 12.0–15.0)
MCH: 33.2 pg (ref 26.0–34.0)
MCHC: 33.4 g/dL (ref 30.0–36.0)
MCV: 99.5 fL (ref 80.0–100.0)
Platelets: 172 K/uL (ref 150–400)
RBC: 3.82 MIL/uL — ABNORMAL LOW (ref 3.87–5.11)
RDW: 13.4 % (ref 11.5–15.5)
WBC: 8.4 K/uL (ref 4.0–10.5)
nRBC: 0 % (ref 0.0–0.2)

## 2024-01-21 LAB — MAGNESIUM: Magnesium: 2.1 mg/dL (ref 1.7–2.4)

## 2024-01-21 LAB — CBG MONITORING, ED: Glucose-Capillary: 154 mg/dL — ABNORMAL HIGH (ref 70–99)

## 2024-01-21 LAB — PRO BRAIN NATRIURETIC PEPTIDE: Pro Brain Natriuretic Peptide: 1836 pg/mL — ABNORMAL HIGH (ref <300.0)

## 2024-01-21 MED ORDER — EMPAGLIFLOZIN 25 MG PO TABS
25.0000 mg | ORAL_TABLET | Freq: Every day | ORAL | Status: DC
  Administered 2024-01-21: 25 mg via ORAL
  Filled 2024-01-21: qty 1

## 2024-01-21 MED ORDER — RANOLAZINE ER 500 MG PO TB12
500.0000 mg | ORAL_TABLET | Freq: Two times a day (BID) | ORAL | Status: DC
  Administered 2024-01-21: 500 mg via ORAL
  Filled 2024-01-21 (×2): qty 1

## 2024-01-21 MED ORDER — METOPROLOL SUCCINATE ER 50 MG PO TB24
100.0000 mg | ORAL_TABLET | Freq: Every day | ORAL | Status: DC
  Administered 2024-01-21: 100 mg via ORAL
  Filled 2024-01-21: qty 2

## 2024-01-21 MED ORDER — HYDROCODONE-ACETAMINOPHEN 5-325 MG PO TABS: 1.0000 | ORAL_TABLET | ORAL | Status: DC | PRN

## 2024-01-21 MED ORDER — SPIRONOLACTONE 12.5 MG HALF TABLET
12.5000 mg | ORAL_TABLET | Freq: Every day | ORAL | Status: DC
  Administered 2024-01-21: 12.5 mg via ORAL
  Filled 2024-01-21: qty 1

## 2024-01-21 MED ORDER — ONDANSETRON HCL 4 MG/2ML IJ SOLN: 4.0000 mg | Freq: Four times a day (QID) | INTRAMUSCULAR | Status: DC | PRN

## 2024-01-21 MED ORDER — ENOXAPARIN SODIUM 30 MG/0.3ML IJ SOSY
30.0000 mg | PREFILLED_SYRINGE | INTRAMUSCULAR | Status: DC
  Administered 2024-01-21: 30 mg via SUBCUTANEOUS
  Filled 2024-01-21: qty 0.3

## 2024-01-21 MED ORDER — ASPIRIN 81 MG PO TBEC
81.0000 mg | DELAYED_RELEASE_TABLET | Freq: Every day | ORAL | Status: DC
  Administered 2024-01-21: 81 mg via ORAL
  Filled 2024-01-21: qty 1

## 2024-01-21 MED ORDER — ISOSORBIDE MONONITRATE ER 60 MG PO TB24
120.0000 mg | ORAL_TABLET | Freq: Every day | ORAL | Status: DC
  Administered 2024-01-21: 120 mg via ORAL
  Filled 2024-01-21: qty 2

## 2024-01-21 MED ORDER — ACETAMINOPHEN 325 MG RE SUPP: 650.0000 mg | Freq: Four times a day (QID) | RECTAL | Status: DC | PRN

## 2024-01-21 MED ORDER — LOSARTAN POTASSIUM 50 MG PO TABS
25.0000 mg | ORAL_TABLET | Freq: Every day | ORAL | Status: DC
  Administered 2024-01-21: 25 mg via ORAL
  Filled 2024-01-21: qty 1

## 2024-01-21 MED ORDER — ACETAMINOPHEN 325 MG PO TABS: 650.0000 mg | ORAL_TABLET | Freq: Four times a day (QID) | ORAL | Status: DC | PRN

## 2024-01-21 MED ORDER — MAGNESIUM OXIDE 400 MG PO TABS
400.0000 mg | ORAL_TABLET | Freq: Two times a day (BID) | ORAL | Status: DC
  Administered 2024-01-21: 400 mg via ORAL
  Filled 2024-01-21 (×3): qty 1

## 2024-01-21 MED ORDER — ROSUVASTATIN CALCIUM 20 MG PO TABS
40.0000 mg | ORAL_TABLET | Freq: Every day | ORAL | Status: DC
  Administered 2024-01-21: 40 mg via ORAL
  Filled 2024-01-21 (×2): qty 2

## 2024-01-21 MED ORDER — TORSEMIDE 20 MG PO TABS
10.0000 mg | ORAL_TABLET | Freq: Two times a day (BID) | ORAL | Status: DC
  Administered 2024-01-21: 10 mg via ORAL

## 2024-01-21 MED ORDER — TRAZODONE HCL 50 MG PO TABS: 25.0000 mg | ORAL_TABLET | Freq: Every day | ORAL | Status: DC

## 2024-01-21 MED ORDER — PANTOPRAZOLE SODIUM 40 MG PO TBEC
40.0000 mg | DELAYED_RELEASE_TABLET | Freq: Every day | ORAL | Status: DC
  Administered 2024-01-21: 40 mg via ORAL
  Filled 2024-01-21: qty 1

## 2024-01-21 MED ORDER — LORATADINE 10 MG PO TABS: 10.0000 mg | ORAL_TABLET | Freq: Every day | ORAL | Status: DC

## 2024-01-21 MED ORDER — SODIUM CHLORIDE 0.9% FLUSH
3.0000 mL | Freq: Two times a day (BID) | INTRAVENOUS | Status: DC
  Administered 2024-01-21 (×2): 3 mL via INTRAVENOUS

## 2024-01-21 MED ORDER — EZETIMIBE 10 MG PO TABS: 10.0000 mg | ORAL_TABLET | Freq: Every day | ORAL | Status: DC

## 2024-01-21 MED ORDER — TORSEMIDE 20 MG PO TABS
20.0000 mg | ORAL_TABLET | Freq: Two times a day (BID) | ORAL | Status: DC
  Filled 2024-01-21: qty 1

## 2024-01-21 MED ORDER — MECLIZINE HCL 25 MG PO TABS
12.5000 mg | ORAL_TABLET | Freq: Two times a day (BID) | ORAL | Status: DC | PRN
Start: 2024-01-21 — End: 2024-01-21

## 2024-01-21 MED ORDER — ONDANSETRON HCL 4 MG PO TABS
4.0000 mg | ORAL_TABLET | Freq: Four times a day (QID) | ORAL | Status: DC | PRN
  Administered 2024-01-21: 4 mg via ORAL
  Filled 2024-01-21: qty 1

## 2024-01-21 NOTE — Assessment & Plan Note (Signed)
 Continue trazodone

## 2024-01-21 NOTE — ED Notes (Signed)
Patient ambulatory to bathroom with walker

## 2024-01-21 NOTE — Assessment & Plan Note (Signed)
-   Continue home meds

## 2024-01-21 NOTE — ED Notes (Signed)
 Patient moved to recliner and out of bed.

## 2024-01-21 NOTE — Assessment & Plan Note (Addendum)
#  Chronic HFrEF/ischemic cardiomyopathy, (EF 26% ,cMRI, 02/2023) #Three-vessel CAD(LHC 04/2023) LHC, not a CABG candidate #History of syncope 08/2022 (Zio 06/21/23, 4% PVCs) #Mild aortic stenosis -Concern for arrhythmia in view of extensive cardiac history -BP was somewhat soft at 117/42-improved to 126/71 - Continuous cardiac monitoring -Neurologic checks with fall precautions -Cardiology consulted, given upcoming appointment with EP in 2 weeks to discuss AICD -Continue GDMT with BP parameters (Imdur , losartan , metoprolol , Jardiance  Ranexa , rosuvastatin , spironolactone  and torsemide )

## 2024-01-21 NOTE — Assessment & Plan Note (Signed)
 -  Renal function  at baseline

## 2024-01-21 NOTE — Discharge Summary (Signed)
 Jodi Chavez FMW:[id number] DOB: 12/28/1950 DOA: 01/20/2024  PCP: Shelda Atlas, MD  Admit date: 01/20/2024 Discharge date: 01/21/2024  Time spent: 35 minutes  Recommendations for Outpatient Follow-up:  Cardiology f/u 1 week ([facility]) EP f/u 12/10 as scheduled     Discharge Diagnoses:  Principal Problem:   Syncope Active Problems:   Hypertension   Depression   Stage 3b chronic kidney disease (HCC)   Chronic systolic CHF (congestive heart failure) (HCC)   Ischemic cardiomyopathy   Discharge Condition: stable  Diet recommendation: heart healthy  Filed Weights   01/20/24 1958  Weight: 83 kg    History of present illness:   Jodi Chavez is a 73 y.o. female with medical history significant for Chronic systolic CHF secondary to ischemic cardiomyopathy, (EF 26% on cMRI, 02/2023), meeting with EP in 2 weeks to discuss AICD, as well as history of three-vessel CAD(LHC, 04/2023) LHC, not a CABG candidate, syncope 08/2022 ([facility] 06/21/23, 4% PVCs), mild aortic stenosis, CKD lllb, being admitted with a syncopal event.  Patient was in her usual state of health and was sitting at the dining table when she suddenly felt nauseous proceeding to the pass out.  Her nephew was able to help her down and she did not fall.  She denied chest pain, shortness of breath, lightheadedness, palpitations.  Denies lower extremity pain or swelling.  She was back to baseline by arrival. In the ED vitals with soft BP 117/42.  Troponin 33-30, proBNP not done.  Creatinine at baseline.  CBC WNL, UA normal EKG without acute findings Chest x-ray clear Patient treated with an LR bolus  Hospital Course:   Patient with history severe hfref, also CAD, ckd 3b, presenting with syncope. Here vitals normal, labs stable with kidney function at baseline, normal electrolytes, no significant anemia. No signs infection. No respiratory symptoms to suggest PE. NO significant findings on EKT/tele. Evaluated by cardiology who  discussed case with EP (patient is scheduled to see them in 2 weeks for planning for ICD), no changes to current meds advised, close outpatient f/u advised. F/u with cardiology in one week advised.  Procedures: none   Consultations: cardiology  Discharge Exam: Vitals:   01/21/24 1030 01/21/24 1130  BP: (!) 145/74 (!) 97/52  Pulse: 85 77  Resp: 20 19  Temp:    SpO2: 92% 97%    General: NAD Cardiovascular: RR, distant heart sounds Respiratory: CTAB Ext: trace LE edema, warm  Discharge Instructions   Discharge Instructions     Diet - low sodium heart healthy   Complete by: As directed    Increase activity slowly   Complete by: As directed       Allergies as of 01/21/2024       Reactions   Naproxen Sodium Rash   anaprox        Medication List     STOP taking these medications    oxyCODONE -acetaminophen  5-325 MG tablet Commonly known as: PERCOCET/ROXICET       TAKE these medications    acetaminophen  500 MG tablet Commonly known as: TYLENOL  Take 500-1,000 mg by mouth See admin instructions. Take 1000 mg in the morning 500 mg at lunch and 1000 mg at bedtime   ascorbic acid  500 MG tablet Commonly known as: VITAMIN C  Take 500 mg by mouth daily.   aspirin  EC 81 MG tablet Take 81 mg by mouth daily.   Delsym  30 MG/5ML liquid Generic drug: dextromethorphan  Take 30 mg by mouth 2 (two) times daily as needed  for cough.   ezetimibe  10 MG tablet Commonly known as: ZETIA  Take 10 mg by mouth at bedtime.   fluticasone  50 MCG/ACT nasal spray Commonly known as: FLONASE  Place 2 sprays into both nostrils daily.   gabapentin  300 MG capsule Commonly known as: NEURONTIN  Take 300 mg by mouth 3 (three) times daily.   GARLIC  CHOLESTA HEALTH PO Take 5,000 mg by mouth every evening. [place] for Cholesterol   GARLIC  PO Take 1,800 mg by mouth daily after breakfast. [place] Blood pressure formula   GLUCOSAMINE-CHONDROITIN PO Take 1 tablet by mouth 2 (two)  times daily. 1500 mg glucosamine 1200 mg Chondroitin   isosorbide  mononitrate 120 MG 24 hr tablet Commonly known as: IMDUR  Take 120 mg by mouth daily.   Jardiance  25 MG Tabs tablet Generic drug: empagliflozin  Take 25 mg by mouth daily.   loratadine  10 MG tablet Commonly known as: CLARITIN  Take 1 tablet (10 mg total) by mouth daily.   losartan  25 MG tablet Commonly known as: COZAAR  Take 25 mg by mouth daily.   magnesium  oxide 400 MG tablet Commonly known as: MAG-OX Take 400 mg by mouth 2 (two) times daily.   meclizine  12.5 MG tablet Commonly known as: ANTIVERT  Take 12.5 mg by mouth 2 (two) times daily as needed for dizziness.   metoprolol  succinate 100 MG 24 hr tablet Commonly known as: TOPROL -XL Take 100 mg by mouth daily.   multivitamin with minerals Tabs tablet Take 1 tablet by mouth daily after breakfast.   ondansetron  4 MG disintegrating tablet Commonly known as: ZOFRAN -ODT Take 4 mg by mouth every 8 (eight) hours as needed.   OYSTER SHELL CALCIUM /D PO Take 2 tablets by mouth 2 (two) times daily.   pantoprazole  40 MG tablet Commonly known as: PROTONIX  Take 1 tablet (40 mg total) by mouth daily.   polyethylene glycol 17 g packet Commonly known as: MIRALAX  / GLYCOLAX  Take 17 g by mouth daily.   ranolazine  500 MG 12 hr tablet Commonly known as: RANEXA  Take 500 mg by mouth 2 (two) times daily.   rosuvastatin  40 MG tablet Commonly known as: CRESTOR  Take 40 mg by mouth daily.   sennosides-docusate sodium  8.6-50 MG tablet Commonly known as: SENOKOT-S Take 1 tablet by mouth 2 (two) times daily.   spironolactone  25 MG tablet Commonly known as: ALDACTONE  Take 1/2 (one-half) tablet by mouth once daily   torsemide  20 MG tablet Commonly known as: DEMADEX  Take 20 mg by mouth 2 (two) times daily.   traZODone  50 MG tablet Commonly known as: DESYREL  Take 25 mg by mouth at bedtime.   Ventolin  HFA 108 (90 Base) MCG/ACT inhaler Generic drug: albuterol  1 puff  every 4 (four) hours as needed for wheezing or shortness of breath.   zinc  gluconate 50 MG tablet Take 250 mg by mouth daily.       Allergies  Allergen Reactions   Naproxen Sodium Rash    anaprox    Follow-up Information     [facility], Dwayne D, MD. Go in 1 week(s).   Specialties: Cardiology, Internal Medicine Contact information: [address] [place] [address] [phone]                  The results of significant diagnostics from this hospitalization (including imaging, microbiology, ancillary and laboratory) are listed below for reference.    Significant Diagnostic Studies: DG Chest Portable 1 View Result Date: 01/21/2024 EXAM: 1 VIEW(S) XRAY OF THE CHEST 01/21/2024 12:27:33 AM COMPARISON: 11/21/2022 CLINICAL HISTORY: Syncopal event. FINDINGS:  LUNGS AND PLEURA: The lungs are hypoinflated but clear. No pleural effusion. No pneumothorax. HEART AND MEDIASTINUM: The cardiac shadow is stable. BONES AND SOFT TISSUES: No acute osseous abnormality. IMPRESSION: 1. No acute cardiopulmonary findings. Electronically signed by: Oneil Devonshire MD 01/21/2024 12:30 AM EST RP Workstation: [id number]    Microbiology: No results found for this or any previous visit (from the past 240 hours).   Labs: Basic Metabolic Panel: Recent Labs  Lab 01/20/24 2012 01/20/24 2100  NA 137  --   K 3.9  --   CL 97*  --   CO2 26  --   GLUCOSE 154*  --   BUN 25*  --   CREATININE 1.69*  --   CALCIUM  9.7  --   MG  --  2.1   Liver Function Tests: Recent Labs  Lab 01/20/24 2012  AST 27  ALT 20  ALKPHOS 72  BILITOT 0.3  PROT 6.8  ALBUMIN 4.1   No results for input(s): LIPASE, AMYLASE in the last 168 hours. No results for input(s): AMMONIA in the last 168 hours. CBC: Recent Labs  Lab 01/20/24 2012 01/21/24 0304  WBC 8.8 8.4  HGB 13.1 12.7  HCT 39.0 38.0  MCV 99.2 99.5  PLT 180 172   Cardiac Enzymes: No results for input(s): CKTOTAL, CKMB, CKMBINDEX,  TROPONINI in the last 168 hours. BNP: BNP (last 3 results) Recent Labs    03/18/23 1205 04/29/23 1040 12/30/23 1615  BNP 657.8* 1,082.3* 172.2*    ProBNP (last 3 results) Recent Labs    01/20/24 2100  PROBNP 1,836.0*    CBG: Recent Labs  Lab 01/21/24 0513  GLUCAP 154*       Signed:  Devaughn KATHEE Ban MD.  [facility] 01/21/2024, 12:08 PM

## 2024-01-21 NOTE — Consult Note (Signed)
 [facility] CLINIC CARDIOLOGY CONSULT NOTE       Patient ID: Jodi Chavez MRN: [id number] DOB/AGE: 72-19-1952 72 y.o.  Admit date: 01/20/2024 Referring Physician Dr. Delayne Solian Primary Physician Shelda Atlas, MD  Primary Cardiologist Dr. Florencio Reason for Consultation Syncope  HPI: Jodi Chavez is a 74 y.o. female  with a past medical history of coronary artery disease not amenable to CABG, chronic systolic heart failure, hx COVID-related myocarditis, chronic renal sufficiency stage IIIa, hypertension, hyperlipidemia, diabetes, obesity, diabetic neuropathy who presented to the ED on 01/20/2024 for LOC. Cardiology was consulted for further evaluation.   Patient reports that she had 2 syncopal episodes yesterday while at dinner at her nephew's.  EMS was called and she was brought to the ED for evaluation.  Workup in the ED notable for creatinine 1.69, potassium 3.9, hemoglobin 13.1, WBC 8.8. Troponins 33 > 30, BNP 1836. EKG in the ED NSR rate 82 bpm, no acute ischemic changes. CXR without acute abnormality.   At the time of my evaluation this morning, patient is resting in hospital bed.  We discussed her symptoms in further detail.  She states that yesterday she was in her usual state of health until she was at her nephew's for Thanksgiving dinner, states that she had eaten about half of her food and began feeling nauseated and that the room was spinning.  She reports that she had 2 syncopal events, she is not sure how long they lasted but she does not believe that they were very long.  She denies any chest pain, palpitations prior to these episodes.  Also denies any significant lightheadedness or dizziness.  Endorses good p.o. intake however she reports that she did not eat lunch yesterday and when EMS arrived and checked her blood glucose it was 82.  Review of systems complete and found to be negative unless listed above    Past Medical History:  Diagnosis Date   Acute respiratory  failure with hypoxia (HCC) 05/27/2022   Arthritis    hip   CHF (congestive heart failure) (HCC)    Constipation    uses laxatives several times a week   Depression    Diabetes mellitus    takes Metformin  and Amaryl  daily   Dizziness    occasionally and related to meds    Early cataracts, bilateral    History of bronchitis    last time several yrs ago   History of migraine    many yrs ago   Hyperlipidemia    takes Zetia  and Zocor  daily   Hypertension    takes Benazepril  nightly and Propranolol  tid and Clonidine  daily   Insomnia    Low back pain    Peripheral edema    Peripheral neuropathy    PONV (postoperative nausea and vomiting)    Slow urinary stream    occasionally    Past Surgical History:  Procedure Laterality Date   COLONOSCOPY     CORONARY BALLOON ANGIOPLASTY N/A 05/15/2023   Procedure: CORONARY BALLOON ANGIOPLASTY;  Surgeon: Darron Deatrice LABOR, MD;  Location: [facility] INVASIVE CV LAB;  Service: Cardiovascular;  Laterality: N/A;   d&c/hysteroscopy/ablation     DILATION AND CURETTAGE OF UTERUS     couple of times   ESOPHAGOGASTRODUODENOSCOPY     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   LEFT HEART CATH AND CORONARY ANGIOGRAPHY Left 01/14/2023   Procedure: LEFT HEART CATH AND CORONARY ANGIOGRAPHY;  Surgeon: Florencio Cara BIRCH, MD;  Location: [facility] INVASIVE CV  LAB;  Service: Cardiovascular;  Laterality: Left;   LEFT HEART CATH AND CORONARY ANGIOGRAPHY N/A 05/15/2023   Procedure: LEFT HEART CATH AND CORONARY ANGIOGRAPHY;  Surgeon: Darron Deatrice LABOR, MD;  Location: [facility] INVASIVE CV LAB;  Service: Cardiovascular;  Laterality: N/A;   POSTERIOR CERVICAL FUSION/FORAMINOTOMY  10/15/2011   Procedure: POSTERIOR CERVICAL FUSION/FORAMINOTOMY LEVEL 2;  Surgeon: Lynwood FORBES Better, MD;  Location: [facility] OR;  Service: Orthopedics;  Laterality: N/A;  Right C6-7, C7-T1 Foraminotomy with excision HNP C7-T1   UPPER GASTROINTESTINAL ENDOSCOPY      (Not in a hospital  admission)  Social History   Socioeconomic History   Marital status: Widowed    Spouse name: Not on file   Number of children: Not on file   Years of education: Not on file   Highest education level: Not on file  Occupational History   Not on file  Tobacco Use   Smoking status: Never   Smokeless tobacco: Never  Vaping Use   Vaping status: Never Used  Substance and Sexual Activity   Alcohol use: No   Drug use: No   Sexual activity: Never  Other Topics Concern   Not on file  Social History Narrative   Not on file   Social Drivers of Health   Financial Resource Strain: Not on file  Food Insecurity: No Food Insecurity (09/08/2022)   Hunger Vital Sign    Worried About Running Out of Food in the Last Year: Never true    Ran Out of Food in the Last Year: Never true  Transportation Needs: No Transportation Needs (09/08/2022)   PRAPARE - Administrator, Civil Service (Medical): No    Lack of Transportation (Non-Medical): No  Physical Activity: Not on file  Stress: Not on file  Social Connections: Not on file  Intimate Partner Violence: Not At Risk (09/08/2022)   Humiliation, Afraid, Rape, and Kick questionnaire    Fear of Current or Ex-Partner: No    Emotionally Abused: No    Physically Abused: No    Sexually Abused: No    Family History  Problem Relation Age of Onset   Cancer Mother    Stroke Mother    Diabetes Mother    Hypertension Mother    Colon cancer Mother 11       died February 09, 2006   Gout Father    Heart disease Father      Vitals:   01/20/24 1958 01/21/24 0000 01/21/24 0400 01/21/24 0541  BP:  126/71  (!) 116/56  Pulse:  74 80 81  Resp:  18 20 14   Temp:  97.9 F (36.6 C) 98 F (36.7 C) 98.3 F (36.8 C)  TempSrc:   Oral Oral  SpO2:  93% 98% 95%  Weight: 83 kg     Height: 5' 1 (1.549 m)       PHYSICAL EXAM General: Chronically ill-appearing female, well nourished, in no acute distress. HEENT: Normocephalic and atraumatic. Neck: No JVD.   Lungs: Normal respiratory effort on room air. Clear bilaterally to auscultation. No wheezes, crackles, rhonchi.  Heart: HRRR. Normal S1 and S2 without gallops or murmurs.  Abdomen: Non-distended appearing.  Msk: Normal strength and tone for age. Extremities: Warm and well perfused. No clubbing, cyanosis.  No edema.  Neuro: Alert and oriented X 3. Psych: Answers questions appropriately.   Labs: Basic Metabolic Panel: Recent Labs    01/20/24 2012 01/20/24 2100  NA 137  --   K 3.9  --   CL  97*  --   CO2 26  --   GLUCOSE 154*  --   BUN 25*  --   CREATININE 1.69*  --   CALCIUM  9.7  --   MG  --  2.1   Liver Function Tests: Recent Labs    01/20/24 2012  AST 27  ALT 20  ALKPHOS 72  BILITOT 0.3  PROT 6.8  ALBUMIN 4.1   No results for input(s): LIPASE, AMYLASE in the last 72 hours. CBC: Recent Labs    01/20/24 2012 01/21/24 0304  WBC 8.8 8.4  HGB 13.1 12.7  HCT 39.0 38.0  MCV 99.2 99.5  PLT 180 172   Cardiac Enzymes: No results for input(s): CKTOTAL, CKMB, CKMBINDEX, TROPONINIHS in the last 72 hours. BNP: No results for input(s): BNP in the last 72 hours. D-Dimer: No results for input(s): DDIMER in the last 72 hours. Hemoglobin A1C: No results for input(s): HGBA1C in the last 72 hours. Fasting Lipid Panel: No results for input(s): CHOL, HDL, LDLCALC, TRIG, CHOLHDL, LDLDIRECT in the last 72 hours. Thyroid  Function Tests: No results for input(s): TSH, T4TOTAL, T3FREE, THYROIDAB in the last 72 hours.  Invalid input(s): FREET3 Anemia Panel: No results for input(s): VITAMINB12, FOLATE, FERRITIN, TIBC, IRON, RETICCTPCT in the last 72 hours.   Radiology: DG Chest Portable 1 View Result Date: 01/21/2024 EXAM: 1 VIEW(S) XRAY OF THE CHEST 01/21/2024 12:27:33 AM COMPARISON: 11/21/2022 CLINICAL HISTORY: Syncopal event. FINDINGS: LUNGS AND PLEURA: The lungs are hypoinflated but clear. No pleural effusion. No  pneumothorax. HEART AND MEDIASTINUM: The cardiac shadow is stable. BONES AND SOFT TISSUES: No acute osseous abnormality. IMPRESSION: 1. No acute cardiopulmonary findings. Electronically signed by: Oneil Devonshire MD 01/21/2024 12:30 AM EST RP Workstation: [id number]    ECHO 09/2023: 1. Left ventricular ejection fraction, by estimation, is 30 to 35%. The left ventricle has moderately decreased function. The left ventricle demonstrates regional wall motion abnormalities (see scoring diagram /findings for description). Left ventricular  diastolic parameters are consistent with Grade II diastolic dysfunction (pseudonormalization). Elevated left atrial pressure.   2. Right ventricular systolic function is normal. The right ventricular size is normal. Tricuspid regurgitation signal is inadequate for assessing PA pressure.   3. The mitral valve is degenerative. Mild mitral valve regurgitation. No  evidence of mitral stenosis. Severe mitral annular calcification.   4. The aortic valve is tricuspid. There is mild thickening of the aortic  valve. Aortic valve regurgitation is not visualized. Aortic valve  sclerosis is present, with no evidence of aortic valve stenosis.   5. The inferior vena cava is normal in size with greater than 50%  respiratory variability, suggesting right atrial pressure of 3 mmHg.   TELEMETRY (personally reviewed): Sinus rhythm rate 80s, no events  EKG (personally reviewed): NSR rate 82 bpm  Data reviewed by me 01/21/2024: last 24h vitals tele labs imaging I/O ED provider note, admission H&P  Principal Problem:   Syncope Active Problems:   Hypertension   Depression   Stage 3b chronic kidney disease (HCC)   Chronic systolic CHF (congestive heart failure) (HCC)   Ischemic cardiomyopathy    ASSESSMENT AND PLAN:  Jodi Chavez is a 73 y.o. female  with a past medical history of coronary artery disease not amenable to CABG, chronic systolic heart failure, hx COVID-related  myocarditis, chronic renal sufficiency stage IIIa, hypertension, hyperlipidemia, diabetes, obesity, diabetic neuropathy who presented to the ED on 01/20/2024 for LOC. Cardiology was consulted for further evaluation.   # Syncope # Coronary artery  disease # Ischemic cardiomyopathy # Chronic HFrEF Patient with 2 episodes of syncope yesterday at Thanksgiving dinner.  Possibly related to hypoglycemic episode.  No cardiac symptoms around the time of these episodes.  Telemetry has been unrevealing thus far.  Troponins minimal and flat, no concern for ACS.  EKG without acute ischemic changes. - Adjusted dose of torsemide  to reflect home dose of 10 mg twice daily.  Continue spironolactone  12.5 mg daily, metoprolol  succinate 100 mg daily, losartan  25 mg daily, Jardiance  25 mg daily. - Continue Imdur  120 mg daily, Ranexa  500 mg twice daily. - Continue Crestor  40 mg daily, Zetia  10 mg daily, aspirin  81 mg daily. - Telemetry monitoring has been quite unrevealing.  Discussed case with EP Albino Needle, NP) as she is scheduled to see Dr. Cindie in 2 weeks for possible ICD evaluation.  They agree that further monitoring is not warranted at this time.  Can consider discharge home today.  Recommend follow-up with Dr. Florencio in the next few weeks.  This patient's plan of care was discussed and created with Dr. Custovic and she is in agreement.  Signed: Danita Bloch, PA-C  01/21/2024, 7:27 AM [facility] Cardiology

## 2024-01-21 NOTE — H&P (Signed)
 History and Physical    Patient: AMA MCMASTER FMW:[id number] DOB: 1950-11-22 DOA: 01/20/2024 DOS: the patient was seen and examined on 01/21/2024 PCP: Shelda Atlas, MD  Patient coming from: Home  Chief Complaint:  Chief Complaint  Patient presents with   Loss of Consciousness    HPI: Jodi Chavez is a 73 y.o. female with medical history significant for Chronic systolic CHF secondary to ischemic cardiomyopathy, (EF 26% on cMRI, 02/2023), meeting with EP in 2 weeks to discuss AICD, as well as history of three-vessel CAD(LHC, 04/2023) LHC, not a CABG candidate, syncope 08/2022 (Zio 06/21/23, 4% PVCs), mild aortic stenosis, CKD lllb, being admitted with a syncopal event.  Patient was in her usual state of health and was sitting at the dining table when she suddenly felt nauseous proceeding to the pass out.  Her nephew was able to help her down and she did not fall.  She denied chest pain, shortness of breath, lightheadedness, palpitations.  Denies lower extremity pain or swelling.  She was back to baseline by arrival. In the ED vitals with soft BP 117/42.  Troponin 33-30, proBNP not done.  Creatinine at baseline.  CBC WNL, UA normal EKG without acute findings Chest x-ray clear Patient treated with an LR bolus Admission requested     Past Medical History:  Diagnosis Date   Acute respiratory failure with hypoxia (HCC) 05/27/2022   Arthritis    hip   CHF (congestive heart failure) (HCC)    Constipation    uses laxatives several times a week   Depression    Diabetes mellitus    takes Metformin  and Amaryl  daily   Dizziness    occasionally and related to meds    Early cataracts, bilateral    History of bronchitis    last time several yrs ago   History of migraine    many yrs ago   Hyperlipidemia    takes Zetia  and Zocor  daily   Hypertension    takes Benazepril  nightly and Propranolol  tid and Clonidine  daily   Insomnia    Low back pain    Peripheral edema    Peripheral  neuropathy    PONV (postoperative nausea and vomiting)    Slow urinary stream    occasionally   Past Surgical History:  Procedure Laterality Date   COLONOSCOPY     CORONARY BALLOON ANGIOPLASTY N/A 05/15/2023   Procedure: CORONARY BALLOON ANGIOPLASTY;  Surgeon: Darron Deatrice LABOR, MD;  Location: [facility];  Service: Cardiovascular;  Laterality: N/A;   d&c/hysteroscopy/ablation     DILATION AND CURETTAGE OF UTERUS     couple of times   ESOPHAGOGASTRODUODENOSCOPY     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   LEFT HEART CATH AND CORONARY ANGIOGRAPHY Left 01/14/2023   Procedure: LEFT HEART CATH AND CORONARY ANGIOGRAPHY;  Surgeon: Florencio Cara BIRCH, MD;  Location: [facility] INVASIVE CV LAB;  Service: Cardiovascular;  Laterality: Left;   LEFT HEART CATH AND CORONARY ANGIOGRAPHY N/A 05/15/2023   Procedure: LEFT HEART CATH AND CORONARY ANGIOGRAPHY;  Surgeon: Darron Deatrice LABOR, MD;  Location: [facility];  Service: Cardiovascular;  Laterality: N/A;   POSTERIOR CERVICAL FUSION/FORAMINOTOMY  10/15/2011   Procedure: POSTERIOR CERVICAL FUSION/FORAMINOTOMY LEVEL 2;  Surgeon: Lynwood FORBES Better, MD;  Location: [facility];  Service: Orthopedics;  Laterality: N/A;  Right C6-7, C7-T1 Foraminotomy with excision HNP C7-T1   UPPER GASTROINTESTINAL ENDOSCOPY     Social History:  reports that she has never smoked.  She has never used smokeless tobacco. She reports that she does not drink alcohol and does not use drugs.  Allergies  Allergen Reactions   Naproxen Sodium Rash    anaprox    Family History  Problem Relation Age of Onset   Cancer Mother    Stroke Mother    Diabetes Mother    Hypertension Mother    Colon cancer Mother 39       died 02/24/06   Gout Father    Heart disease Father     Prior to Admission medications   Medication Sig Start Date End Date Taking? Authorizing Provider  acetaminophen  (TYLENOL ) 500 MG tablet Take 500-1,000 mg by mouth See admin instructions. Take  1000 mg in the morning 500 mg at lunch and 1000 mg at bedtime   Yes [provider]  ascorbic acid  (VITAMIN C ) 500 MG tablet Take 500 mg by mouth daily.   Yes [provider]  aspirin  81 MG EC tablet Take 81 mg by mouth daily.   Yes [provider]  Calcium  Carbonate-Vitamin D  (OYSTER SHELL CALCIUM /D PO) Take 2 tablets by mouth 2 (two) times daily.   Yes [provider]  dextromethorphan  (DELSYM ) 30 MG/5ML liquid Take 30 mg by mouth 2 (two) times daily as needed for cough.   Yes [provider]  empagliflozin  (JARDIANCE ) 25 MG TABS tablet Take 25 mg by mouth daily.   Yes [provider]  ezetimibe  (ZETIA ) 10 MG tablet Take 10 mg by mouth at bedtime.   Yes [provider]  fluticasone  (FLONASE ) 50 MCG/ACT nasal spray Place 2 sprays into both nostrils daily. 10/15/22  Yes [provider]  gabapentin  (NEURONTIN ) 300 MG capsule Take 300 mg by mouth 3 (three) times daily.   Yes [provider]  GARLIC  CHOLESTA HEALTH PO Take 5,000 mg by mouth every evening. [place] for Cholesterol   Yes [provider]  GARLIC  PO Take 1,800 mg by mouth daily after breakfast. [place] Blood pressure formula   Yes [provider]  GLUCOSAMINE-CHONDROITIN PO Take 1 tablet by mouth 2 (two) times daily. 1500 mg glucosamine 1200 mg Chondroitin   Yes [provider]  isosorbide  mononitrate (IMDUR ) 120 MG 24 hr tablet Take 120 mg by mouth daily.   Yes [provider]  loratadine  (CLARITIN ) 10 MG tablet Take 1 tablet (10 mg total) by mouth daily. 09/12/22  Yes Fausto Sor A, DO  losartan  (COZAAR ) 25 MG tablet Take 25 mg by mouth daily. 04/01/23  Yes [provider]  magnesium  oxide (MAG-OX) 400 MG tablet Take 400 mg by mouth 2 (two) times daily. 08/27/22  Yes [provider]  meclizine  (ANTIVERT ) 12.5 MG tablet Take 12.5 mg by mouth 2 (two) times daily as needed for dizziness.   Yes [provider]  metoprolol  succinate (TOPROL -XL) 100 MG 24 hr tablet Take 100 mg by mouth daily. 01/29/23 01/29/24 Yes [provider]  Multiple Vitamin (MULITIVITAMIN WITH MINERALS) TABS Take 1 tablet by mouth daily after breakfast.    Yes [provider]  ondansetron  (ZOFRAN -ODT) 4 MG disintegrating tablet Take 4 mg by mouth every 8 (eight) hours as needed. 12/26/23  Yes [provider]  pantoprazole  (PROTONIX ) 40 MG tablet Take 1 tablet (40 mg total) by mouth daily. 11/25/14  Yes Rai, Ripudeep K, MD  polyethylene glycol (MIRALAX  / GLYCOLAX ) 17 g packet Take 17 g by mouth daily.   Yes [provider]  ranolazine  (RANEXA ) 500 MG 12 hr  tablet Take 500 mg by mouth 2 (two) times daily.   Yes [provider]  rosuvastatin  (CRESTOR ) 40 MG tablet Take 40 mg by mouth daily.   Yes [provider]  sennosides-docusate sodium  (SENOKOT-S) 8.6-50 MG tablet Take 1 tablet by mouth 2 (two) times daily.   Yes [provider]  spironolactone  (ALDACTONE ) 25 MG tablet Take 1/2 (one-half) tablet by mouth once daily 10/25/23  Yes Bensimhon, Toribio SAUNDERS, MD  torsemide  (DEMADEX ) 20 MG tablet Take 20 mg by mouth 2 (two) times daily.   Yes [provider]  traZODone  (DESYREL ) 50 MG tablet Take 25 mg by mouth at bedtime.   Yes [provider]  VENTOLIN  HFA 108 (90 Base) MCG/ACT inhaler 1 puff every 4 (four) hours as needed for wheezing or shortness of breath. 04/18/15  Yes [provider]  zinc  gluconate 50 MG tablet Take 250 mg by mouth daily.   Yes [provider]  oxyCODONE -acetaminophen  (PERCOCET/ROXICET) 5-325 MG tablet Take 1 tablet by mouth every 6 (six) hours as needed for severe pain (pain score 7-10). Patient not taking: Reported on 12/30/2023 11/26/22   [provider]  DOXEPIN  HCL PO Take by mouth.    05/10/11  [provider]  GABAPENTIN , PHN, PO Take by mouth.    05/10/11  [provider]     Physical Exam: Vitals:   01/20/24 1952 01/20/24 1958 01/21/24 0000  BP: (!) 117/42  126/71  Pulse: 67  74  Resp: 16  18  Temp: 97.7 F (36.5 C)  97.9 F (36.6 C)  TempSrc: Oral    SpO2: 94%  93%  Weight:  83 kg   Height:  5' 1 (1.549 m)    Physical Exam Vitals and nursing note reviewed.  Constitutional:      General: She is not in acute distress. HENT:     Head: Normocephalic and atraumatic.  Cardiovascular:     Rate and Rhythm: Normal rate and regular rhythm.     Heart sounds: Normal heart sounds.  Pulmonary:     Effort: Pulmonary effort is normal.     Breath sounds: Normal breath sounds.  Abdominal:     Palpations: Abdomen is soft.     Tenderness: There is no abdominal tenderness.  Neurological:     Mental Status: Mental status is at baseline.     Labs on Admission: I have personally reviewed following labs and imaging studies  CBC: Recent Labs  Lab 01/20/24 2012  WBC 8.8  HGB 13.1  HCT 39.0  MCV 99.2  PLT 180   Basic Metabolic Panel: Recent Labs  Lab 01/20/24 2012  NA 137  K 3.9  CL 97*  CO2 26  GLUCOSE 154*  BUN 25*  CREATININE 1.69*  CALCIUM  9.7   GFR: Estimated Creatinine Clearance: 29.4 mL/min (A) (by C-G formula based on SCr of 1.69 mg/dL (H)). Liver Function Tests: Recent Labs  Lab 01/20/24 2012  AST 27  ALT 20  ALKPHOS 72  BILITOT 0.3  PROT 6.8  ALBUMIN 4.1   No results for input(s): LIPASE, AMYLASE in the last 168 hours. No results for input(s): AMMONIA in the last 168 hours. Coagulation Profile: No results for input(s): INR, PROTIME in the last 168 hours. Cardiac Enzymes: No results for input(s): CKTOTAL, CKMB, CKMBINDEX, TROPONINI in the last 168 hours. BNP (last 3 results) No results for input(s): PROBNP in the last 8760 hours. HbA1C: No results for input(s): HGBA1C in the last 72 hours. CBG: No  results for input(s): GLUCAP in the last 168 hours. Lipid Profile: No results for input(s):  CHOL, HDL, LDLCALC, TRIG, CHOLHDL, LDLDIRECT in the last 72 hours. Thyroid  Function Tests: No results for input(s): TSH, T4TOTAL, FREET4, T3FREE, THYROIDAB in the last 72 hours. Anemia Panel: No results for input(s): VITAMINB12, FOLATE, FERRITIN, TIBC, IRON, RETICCTPCT in the last 72 hours. Urine analysis:    Component Value Date/Time   COLORURINE YELLOW (A) 01/20/2024 2332   APPEARANCEUR CLEAR (A) 01/20/2024 2332   LABSPEC 1.005 01/20/2024 2332   PHURINE 7.0 01/20/2024 2332   GLUCOSEU >=500 (A) 01/20/2024 2332   HGBUR NEGATIVE 01/20/2024 2332   BILIRUBINUR NEGATIVE 01/20/2024 2332   KETONESUR NEGATIVE 01/20/2024 2332   PROTEINUR NEGATIVE 01/20/2024 2332   UROBILINOGEN 0.2 11/21/2014 1835   NITRITE NEGATIVE 01/20/2024 2332   LEUKOCYTESUR NEGATIVE 01/20/2024 2332    Radiological Exams on Admission: DG Chest Portable 1 View Result Date: 01/21/2024 EXAM: 1 VIEW(S) XRAY OF THE CHEST 01/21/2024 12:27:33 AM COMPARISON: 11/21/2022 CLINICAL HISTORY: Syncopal event. FINDINGS: LUNGS AND PLEURA: The lungs are hypoinflated but clear. No pleural effusion. No pneumothorax. HEART AND MEDIASTINUM: The cardiac shadow is stable. BONES AND SOFT TISSUES: No acute osseous abnormality. IMPRESSION: 1. No acute cardiopulmonary findings. Electronically signed by: Oneil Devonshire MD 01/21/2024 12:30 AM EST RP Workstation: [id number]   Data Reviewed for HPI: Relevant notes from primary care and specialist visits, past discharge summaries as available in EHR, including Care Everywhere. Prior diagnostic testing as pertinent to current admission diagnoses Updated medications and problem lists for reconciliation ED course, including vitals, labs, imaging, treatment and response to treatment Triage notes, nursing and pharmacy notes and ED provider's notes Notable results as noted above in HPI      Assessment and Plan: * Syncope #Chronic HFrEF/ischemic cardiomyopathy, (EF 26%  ,cMRI, 02/2023) #Three-vessel CAD(LHC 04/2023) LHC, not a CABG candidate #History of syncope 08/2022 (Zio 06/21/23, 4% PVCs) #Mild aortic stenosis -Concern for arrhythmia in view of extensive cardiac history -BP was somewhat soft at 117/42-improved to 126/71 - Continuous cardiac monitoring -Neurologic checks with fall precautions -Cardiology consulted, given upcoming appointment with EP in 2 weeks to discuss AICD -Continue GDMT with BP parameters (Imdur , losartan , metoprolol , Jardiance  Ranexa , rosuvastatin , spironolactone  and torsemide )  Hypertension Continue home meds  Stage 3b chronic kidney disease (HCC) Renal function at baseline  Depression Continue trazodone     DVT prophylaxis: Lovenox   Consults: [facility] cardiology  Advance Care Planning:   Code Status: Prior   Family Communication: none  Disposition Plan: Back to previous home environment  Severity of Illness: The appropriate patient status for this patient is OBSERVATION. Observation status is judged to be reasonable and necessary in order to provide the required intensity of service to ensure the patient's safety. The patient's presenting symptoms, physical exam findings, and initial radiographic and laboratory data in the context of their medical condition is felt to place them at decreased risk for further clinical deterioration. Furthermore, it is anticipated that the patient will be medically stable for discharge from the hospital within 2 midnights of admission.   Author: Delayne LULLA Solian, MD 01/21/2024 2:19 AM  For on call review [email or web address].

## 2024-01-21 NOTE — Progress Notes (Signed)
 PHARMACIST - PHYSICIAN COMMUNICATION  CONCERNING:  Enoxaparin  (Lovenox ) for DVT Prophylaxis    RECOMMENDATION: Patient was prescribed enoxaprin 40mg  q24 hours for VTE prophylaxis.   Filed Weights   01/20/24 1958  Weight: 83 kg (182 lb 15.7 oz)    Body mass index is 34.57 kg/m.  Estimated Creatinine Clearance: 29.4 mL/min (A) (by C-G formula based on SCr of 1.69 mg/dL (H)).  Patient is candidate for enoxaparin  30mg  every 24 hours based on CrCl <18ml/min or Weight <45kg  DESCRIPTION: Pharmacy has adjusted enoxaparin  dose per [facility] policy.  Patient is now receiving enoxaparin  30 mg every 24 hours   Rankin CANDIE Dills, PharmD, [facility] 01/21/2024 2:25 AM

## 2024-01-21 NOTE — TOC CM/SW Note (Signed)
 Transition of Care [facility]) - Inpatient Brief Assessment   Patient Details  Name: Jodi Chavez MRN: [id number] Date of Birth: January 18, 1951  Transition of Care [facility]) CM/SW Contact:    Nathanael CHRISTELLA Ring, RN Phone Number: 01/21/2024, 12:45 PM   Clinical Narrative:   Transition of Care (TOC) Screening Note   Patient Details  Name: Jodi Chavez Date of Birth: 06-10-1950   Transition of Care [facility]) CM/SW Contact:    Nathanael CHRISTELLA Ring, RN Phone Number: 01/21/2024, 12:45 PM    Transition of Care Department [facility]) has reviewed patient and no TOC needs have been identified at this time.    Transition of Care Asessment: Insurance and Status: Insurance coverage has been reviewed Patient has primary care physician: Yes     Prior/Current Home Services: No current home services Social Drivers of Health Review: SDOH reviewed no interventions necessary Readmission risk has been reviewed: No (under obs no score generated) Transition of care needs: no transition of care needs at this time

## 2024-01-21 NOTE — ED Notes (Signed)
 Attempting to get in touch with nephew so patient can go home. Patient require walker for any transferring or ambulation ad patient reports there is no one to let her in until her nephew is home.

## 2024-02-03 NOTE — Progress Notes (Unsigned)
 Electrophysiology Office Follow up Visit Note:    Date:  02/03/2024   ID:  Jodi Chavez, DOB [birth year], MRN [id number]  PCP:  Shelda Atlas, MD  [facility] Cardiologist:  Cara JONETTA Lovelace, MD  [facility] Electrophysiologist:  [name], MD    Interval History:     Jodi Chavez is a 73 y.o. female who presents for a follow up visit.   The patient was most recently seen by [facility] November 18, 2023.  She has a history of ischemic cardiomyopathy, chronic systolic heart failure, diabetes.  I saw her in March.  She saw cardiothoracic surgery after the last appointment with me who did not recommend bypass surgery.  Dr. Darron took her for left heart catheterization.  During that procedure angioplasty to the RCA was attempted but was not successful.  She sees the heart failure team.  She saw Dr. Cherrie on December 30, 2023.        Past medical, surgical, social and family history were reviewed.  ROS:   Please see the history of present illness.    All other systems reviewed and are negative.  EKGs/Labs/Other Studies Reviewed:    The following studies were reviewed today:  October 15, 2023 echo EF 30-35 Moderately reduced LV function RV normal Mild MR        Physical Exam:    VS:  There were no vitals taken for this visit.    Wt Readings from Last 3 Encounters:  01/20/24 182 lb 15.7 oz (83 kg)  12/30/23 181 lb 6 oz (82.3 kg)  11/18/23 179 lb (81.2 kg)     GEN: no distress CARD: RRR, No MRG RESP: No IWOB. CTAB.      ASSESSMENT:    No diagnosis found. PLAN:    In order of problems listed above:  #Coronary artery disease #Chronic systolic heart failure #Ischemic cardiomyopathy The patient has persistently reduced LV function.  An ICD is indicated.  I discussed the ICD implant procedure in detail the patient including the risks and she wishes to proceed.  She will need a single-chamber ICD.  Her QRS is narrow, 110 ms.  The patient  has an ischemic ischemic CM (EF 30%), NYHA Class III CHF, and CAD.  He is referred by Dr Bensimohn for risk stratification of sudden death and consideration of ICD implantation.  At this time, she meets criteria for ICD implantation for primary prevention of sudden death.  I have had a thorough discussion with the patient reviewing options.  The patient and their family (if available) have had opportunities to ask questions and have them answered. The patient and I have decided together through a shared decision making process to proceed with ICD implant at this time.    Risks, benefits, alternatives to ICD implantation were discussed in detail with the patient today. The patient understands that the risks include but are not limited to bleeding, infection, pneumothorax, perforation, tamponade, vascular damage, renal failure, MI, stroke, death, inappropriate shocks, and lead dislodgement and wishes to proceed.  We will therefore schedule device implantation at the next available time.  She will continue her aspirin  uninterrupted around the time of the ICD implant procedure.   I discussed my upcoming departure from [facility] during today's clinic appointment.  The patient will continue to follow-up with one of my EP partners moving forward.  Follow-up with EP MD in the next few weeks to discuss the procedure prior to procedure date.    Signed, Ole  Cindie, MD, [facility], [facility] 02/03/2024 9:13 AM    Electrophysiology [facility]

## 2024-02-05 ENCOUNTER — Ambulatory Visit: Attending: Cardiology | Admitting: Cardiology

## 2024-02-05 ENCOUNTER — Encounter: Payer: Self-pay | Admitting: Cardiology

## 2024-02-05 VITALS — BP 120/70 | HR 84 | Ht 61.0 in | Wt 178.0 lb

## 2024-02-05 DIAGNOSIS — I255 Ischemic cardiomyopathy: Secondary | ICD-10-CM | POA: Diagnosis not present

## 2024-02-05 DIAGNOSIS — I5022 Chronic systolic (congestive) heart failure: Secondary | ICD-10-CM | POA: Insufficient documentation

## 2024-02-05 NOTE — Patient Instructions (Signed)
 Medication Instructions:  Your physician recommends that you continue on your current medications as directed. Please refer to the Current Medication list given to you today.  *If you need a refill on your cardiac medications before your next appointment, please call your pharmacy*  Testing/Procedures: ICD Implant Your physician has recommended that you have a defibrillator inserted. An implantable cardioverter defibrillator (ICD) is a small device that is placed in your chest or, in rare cases, your abdomen. This device uses electrical pulses or shocks to help control life-threatening, irregular heartbeats that could lead the heart to suddenly stop beating (sudden cardiac arrest). Leads are attached to the ICD that goes into your heart. This is done in the hospital and usually requires an overnight stay. Please see the instruction sheet given to you today for more information.  Follow-Up: At [facility], you and your health needs are our priority.  As part of our continuing mission to provide you with exceptional heart care, our providers are all part of one team.  This team includes your primary Cardiologist (physician) and Advanced Practice Providers or APPs (Physician Assistants and Nurse Practitioners) who all work together to provide you with the care you need, when you need it.  Your next appointment:   We will contact you to schedule your post-procedure visits.

## 2024-02-13 ENCOUNTER — Telehealth: Payer: Self-pay

## 2024-02-13 DIAGNOSIS — I255 Ischemic cardiomyopathy: Secondary | ICD-10-CM

## 2024-02-13 DIAGNOSIS — I5022 Chronic systolic (congestive) heart failure: Secondary | ICD-10-CM

## 2024-02-13 NOTE — Telephone Encounter (Signed)
-----   Message from Nurse Doreatha BROCKS, RN sent at 02/07/2024  1:50 PM EST ----- Regarding: 03/16/24 ICD implant  Precert:  MD: Kennyth Type of implant: ICD Device manufacturer: TBD Diagnosis: CHF CPT code: ICD implant - 33249 C-code(s), including quantity (if indicated):  Procedure scheduled (date/time): 1/19 at 11:00am at [facility]  Procedure:  Scrub given? No, will give at appointment 1/13 Medication instructions:  Message sent to CVRR? No Added to calendar? Yes Orders entered? Yes Letter complete? No, >30 days before procedure Scheduled with cath lab? Yes Labs ordered (CBC, BMET, PT/INR if on warfarin)? No Dye allergy? No Pre-meds ordered and instructions given? No, >30 days before procedure Letter method: Pick up at appointment 1/13 Special instructions: N/A H&P: 12/10  Follow-up:  Cassie/Angel, please schedule Routine.  Covering RN:  Please send this message to [employer], EP scheduler, EP Scheduling pool, and EP [employer].

## 2024-02-13 NOTE — Telephone Encounter (Signed)
 Pt will get copy of Instruction letter at her visit on 1/13 - letter states to have labs done at [facility] - she can have those done same day as her appt - I have already placed the orders.    I have pend the Instruction letter in her chart to be printed.

## 2024-02-26 ENCOUNTER — Telehealth: Payer: Self-pay

## 2024-02-26 NOTE — Telephone Encounter (Signed)
 LM for pt informing her of the time change of her ICD Implant on 1/19 with Dr. Kennyth. Her procedure time is 830 am and she will need to arrive at 700 am.   I left my direct number for her to call and let me know that she received this message.

## 2024-02-28 ENCOUNTER — Other Ambulatory Visit (HOSPITAL_COMMUNITY): Payer: Self-pay

## 2024-02-28 ENCOUNTER — Telehealth (HOSPITAL_COMMUNITY): Payer: Self-pay

## 2024-02-28 DIAGNOSIS — I255 Ischemic cardiomyopathy: Secondary | ICD-10-CM

## 2024-02-28 DIAGNOSIS — I5022 Chronic systolic (congestive) heart failure: Secondary | ICD-10-CM

## 2024-02-28 NOTE — Telephone Encounter (Signed)
 Attempted to reach patient to discuss upcoming procedure, no answer. Left VM for patient to return call.

## 2024-03-03 NOTE — Telephone Encounter (Signed)
 Spoke with patient to discuss upcoming procedure.   Confirmed patient is scheduled for a Implantable cardioverter defibrilator (ICD) on Monday, January 19 with Dr. Kennyth. Instructed patient to arrive at [facility] entrance, [address] [place], [place], [place]. [address] and check in at front desk at 7:00 AM.   Labs to be completed  Any recent signs of acute illness or been started on antibiotics? No  Any new medications started? No Any medications to hold? Yes. HOLD: Empagliflozin  (Jardiance ) for 3 days prior to the procedure. Last dose on Thursday, January 15.  Medication instructions:  On the morning of your procedure HOLD - Aspirin , Spironolactone , Torsemide  No eating or drinking after midnight prior to procedure.  The night before your procedure and the morning of your procedure, wash thoroughly with the CHG surgical soap from the neck down, paying special attention to the area where your procedure will be performed.  Advised of plan to go home the same day and will only stay overnight if medically necessary. You MUST have a responsible adult to drive you home and MUST be with you the first 24 hours after you arrive home.  Informed a nurse may call a day before the procedure to confirm arrival time and ensure instructions are followed.  Advised to contact RN Navigator at [phone], to inform of any new medications started after call or concerns prior to procedure.  Patient verbalized understanding to all instructions provided and agreed to proceed with procedure.

## 2024-03-10 ENCOUNTER — Encounter: Payer: Self-pay | Admitting: Cardiology

## 2024-03-10 ENCOUNTER — Ambulatory Visit: Attending: Cardiology | Admitting: Cardiology

## 2024-03-10 VITALS — BP 126/66 | HR 85 | Ht 61.0 in | Wt 184.0 lb

## 2024-03-10 DIAGNOSIS — I255 Ischemic cardiomyopathy: Secondary | ICD-10-CM | POA: Insufficient documentation

## 2024-03-10 DIAGNOSIS — I5022 Chronic systolic (congestive) heart failure: Secondary | ICD-10-CM | POA: Insufficient documentation

## 2024-03-10 DIAGNOSIS — I2510 Atherosclerotic heart disease of native coronary artery without angina pectoris: Secondary | ICD-10-CM | POA: Diagnosis not present

## 2024-03-10 NOTE — Patient Instructions (Signed)
 Medication Instructions:  Your physician recommends that you continue on your current medications as directed. Please refer to the Current Medication list given to you today.  *If you need a refill on your cardiac medications before your next appointment, please call your pharmacy*  Lab Work: TODAY: BMET and CBC  Testing/Procedures: ICD Implant Your physician has recommended that you have a defibrillator inserted. An implantable cardioverter defibrillator (ICD) is a small device that is placed in your chest or, in rare cases, your abdomen. This device uses electrical pulses or shocks to help control life-threatening, irregular heartbeats that could lead the heart to suddenly stop beating (sudden cardiac arrest). Leads are attached to the ICD that goes into your heart. This is done in the hospital and usually requires an overnight stay. Please see the instruction sheet given to you today for more information.   Follow-Up: At [facility], you and your health needs are our priority.  As part of our continuing mission to provide you with exceptional heart care, our providers are all part of one team.  This team includes your primary Cardiologist (physician) and Advanced Practice Providers or APPs (Physician Assistants and Nurse Practitioners) who all work together to provide you with the care you need, when you need it.  Your next appointment:   We will contact you to schedule your post-procedure appointments.

## 2024-03-10 NOTE — Progress Notes (Signed)
 " Electrophysiology Office Note:   Date:  03/10/2024  ID:  Jodi Chavez, DOB 07-11-1950, MRN [id number]  Primary Cardiologist: Cara JONETTA Lovelace, MD Electrophysiologist: [name], MD (Inactive)      History of Present Illness:   Jodi Chavez is a 74 y.o. female with h/o 3v CAD, PVCs, DM2, bilateral carotid stenosis and chronic systolic heart failure secondary to ischemic cardiomyopathy who is being seen today for ICD evaluation.  Discussed the use of AI scribe software for clinical note transcription with the patient, who gave verbal consent to proceed.  History of Present Illness Jodi Chavez is a 74 year old female with heart artery blockage who presents for consultation regarding defibrillator placement. She was referred by Dr. Holts for defibrillator implant.  She inquired about the location of the defibrillator, which will be placed in the left chest, and expressed concerns about post-procedure pain.  She uses a walker for mobility due to instability and a left knee that occasionally gives out. She is right-handed and uses her arms for support and daily activities.  She expressed a preference to stay overnight in the hospital post-procedure for observation, as her sister is concerned about her being at home alone immediately after the procedure.  She is on Medicare and Medicaid, and there was a discussion about coverage for the procedure and potential overnight stay.  She is otherwise doing well and has no new or acute complaints today.    Review of systems complete and found to be negative unless listed in HPI.   EP Information / Studies Reviewed:    EKG is not ordered today. EKG from 01/20/24 reviewed which showed SR with IVCD, QRS .      Echo 10/15/23:   1. Left ventricular ejection fraction, by estimation, is 30 to 35%. The  left ventricle has moderately decreased function. The left ventricle  demonstrates regional wall motion abnormalities (see  scoring  diagram/findings for description). Left ventricular   diastolic parameters are consistent with Grade II diastolic dysfunction  (pseudonormalization). Elevated left atrial pressure.   2. Right ventricular systolic function is normal. The right ventricular  size is normal. Tricuspid regurgitation signal is inadequate for assessing  PA pressure.   3. The mitral valve is degenerative. Mild mitral valve regurgitation. No  evidence of mitral stenosis. Severe mitral annular calcification.   4. The aortic valve is tricuspid. There is mild thickening of the aortic  valve. Aortic valve regurgitation is not visualized. Aortic valve  sclerosis is present, with no evidence of aortic valve stenosis.   5. The inferior vena cava is normal in size with greater than 50%  respiratory variability, suggesting right atrial pressure of 3 mmHg.    Physical Exam:   VS:  There were no vitals taken for this visit.   Wt Readings from Last 3 Encounters:  02/05/24 178 lb (80.7 kg)  01/20/24 182 lb 15.7 oz (83 kg)  12/30/23 181 lb 6 oz (82.3 kg)     General: Well developed, in no acute distress.  Neck: No JVD.  Cardiac: Normal rate, regular rhythm.  Resp: Normal work of breathing.  Ext: No edema.  Neuro: No gross focal deficits.  Psych: Normal affect.    ASSESSMENT AND PLAN:     #Chronic systolic heart failure: NYHA class III heart failure.  Well compensated on exam today. #Multivessel CAD and Ischemic cardiomyopathy: Denies chest pain. - Patient meets criteria for ICD given LVEF less than 35% despite optimal medical therapy and  revascularization.  She has NYHA class III heart failure.  Procedure has been previously discussed with Dr. Cindie and plans were made for single-chamber ICD implant with me.  Risk and benefits of ICD implant were readdressed today with patient and her family.  Patient voiced understanding and elected to proceed. -Given caregiver limitations at home, patient will need to  stay overnight in the hospital after implant for monitoring. - Continue GDMT and close follow-up with our HF team.    Follow up with EP Team 90 days after implant.   Signed, Fonda Kitty, MD  "

## 2024-03-11 LAB — BASIC METABOLIC PANEL WITH GFR
BUN/Creatinine Ratio: 18 (ref 12–28)
BUN: 31 mg/dL — ABNORMAL HIGH (ref 8–27)
CO2: 23 mmol/L (ref 20–29)
Calcium: 9.8 mg/dL (ref 8.7–10.3)
Chloride: 97 mmol/L (ref 96–106)
Creatinine, Ser: 1.76 mg/dL — ABNORMAL HIGH (ref 0.57–1.00)
Glucose: 132 mg/dL — ABNORMAL HIGH (ref 70–99)
Potassium: 4.6 mmol/L (ref 3.5–5.2)
Sodium: 140 mmol/L (ref 134–144)
eGFR: 30 mL/min/1.73 — ABNORMAL LOW

## 2024-03-11 LAB — CBC
Hematocrit: 43 % (ref 34.0–46.6)
Hemoglobin: 14.2 g/dL (ref 11.1–15.9)
MCH: 33.6 pg — ABNORMAL HIGH (ref 26.6–33.0)
MCHC: 33 g/dL (ref 31.5–35.7)
MCV: 102 fL — ABNORMAL HIGH (ref 79–97)
Platelets: 209 x10E3/uL (ref 150–450)
RBC: 4.23 x10E6/uL (ref 3.77–5.28)
RDW: 12.7 % (ref 11.7–15.4)
WBC: 9 x10E3/uL (ref 3.4–10.8)

## 2024-03-12 ENCOUNTER — Telehealth: Payer: Self-pay | Admitting: Cardiology

## 2024-03-12 NOTE — Telephone Encounter (Signed)
 Patient wants a call back regarding upcoming surgery on Monday and the house she is staying at.

## 2024-03-12 NOTE — Telephone Encounter (Signed)
 Attempted phone call to pt and left voicemail to contact office at [phone].

## 2024-03-15 ENCOUNTER — Ambulatory Visit: Payer: Self-pay | Admitting: Cardiology

## 2024-03-16 ENCOUNTER — Encounter: Admission: RE | Payer: Self-pay | Source: Home / Self Care

## 2024-03-16 ENCOUNTER — Emergency Department: Admission: EM | Admit: 2024-03-16 | Discharge: 2024-03-16 | Disposition: A | Discharge status: Home / Self Care

## 2024-03-16 ENCOUNTER — Emergency Department

## 2024-03-16 ENCOUNTER — Ambulatory Visit: Admission: RE | Admit: 2024-03-16 | Admitting: Cardiology

## 2024-03-16 ENCOUNTER — Other Ambulatory Visit: Payer: Self-pay

## 2024-03-16 DIAGNOSIS — R110 Nausea: Secondary | ICD-10-CM | POA: Diagnosis not present

## 2024-03-16 DIAGNOSIS — N189 Chronic kidney disease, unspecified: Secondary | ICD-10-CM | POA: Insufficient documentation

## 2024-03-16 DIAGNOSIS — I5022 Chronic systolic (congestive) heart failure: Secondary | ICD-10-CM | POA: Diagnosis not present

## 2024-03-16 DIAGNOSIS — N3000 Acute cystitis without hematuria: Secondary | ICD-10-CM | POA: Insufficient documentation

## 2024-03-16 DIAGNOSIS — E119 Type 2 diabetes mellitus without complications: Secondary | ICD-10-CM | POA: Insufficient documentation

## 2024-03-16 DIAGNOSIS — I130 Hypertensive heart and chronic kidney disease with heart failure and stage 1 through stage 4 chronic kidney disease, or unspecified chronic kidney disease: Secondary | ICD-10-CM | POA: Insufficient documentation

## 2024-03-16 DIAGNOSIS — I110 Hypertensive heart disease with heart failure: Secondary | ICD-10-CM | POA: Insufficient documentation

## 2024-03-16 DIAGNOSIS — R002 Palpitations: Secondary | ICD-10-CM | POA: Diagnosis not present

## 2024-03-16 DIAGNOSIS — R55 Syncope and collapse: Secondary | ICD-10-CM | POA: Diagnosis not present

## 2024-03-16 DIAGNOSIS — R0602 Shortness of breath: Secondary | ICD-10-CM | POA: Insufficient documentation

## 2024-03-16 DIAGNOSIS — R42 Dizziness and giddiness: Secondary | ICD-10-CM | POA: Diagnosis present

## 2024-03-16 DIAGNOSIS — R7989 Other specified abnormal findings of blood chemistry: Secondary | ICD-10-CM | POA: Diagnosis not present

## 2024-03-16 DIAGNOSIS — I2510 Atherosclerotic heart disease of native coronary artery without angina pectoris: Secondary | ICD-10-CM | POA: Diagnosis not present

## 2024-03-16 LAB — CBC WITH DIFFERENTIAL/PLATELET
Abs Immature Granulocytes: 0.04 K/uL (ref 0.00–0.07)
Basophils Absolute: 0 K/uL (ref 0.0–0.1)
Basophils Relative: 1 %
Eosinophils Absolute: 0.1 K/uL (ref 0.0–0.5)
Eosinophils Relative: 2 %
HCT: 41.2 % (ref 36.0–46.0)
Hemoglobin: 13.4 g/dL (ref 12.0–15.0)
Immature Granulocytes: 1 %
Lymphocytes Relative: 17 %
Lymphs Abs: 1.3 K/uL (ref 0.7–4.0)
MCH: 32.5 pg (ref 26.0–34.0)
MCHC: 32.5 g/dL (ref 30.0–36.0)
MCV: 100 fL (ref 80.0–100.0)
Monocytes Absolute: 0.6 K/uL (ref 0.1–1.0)
Monocytes Relative: 8 %
Neutro Abs: 5.4 K/uL (ref 1.7–7.7)
Neutrophils Relative %: 71 %
Platelets: 186 K/uL (ref 150–400)
RBC: 4.12 MIL/uL (ref 3.87–5.11)
RDW: 13.2 % (ref 11.5–15.5)
WBC: 7.4 K/uL (ref 4.0–10.5)
nRBC: 0 % (ref 0.0–0.2)

## 2024-03-16 LAB — COMPREHENSIVE METABOLIC PANEL WITH GFR
ALT: 19 U/L (ref 0–44)
AST: 26 U/L (ref 15–41)
Albumin: 4.1 g/dL (ref 3.5–5.0)
Alkaline Phosphatase: 86 U/L (ref 38–126)
Anion gap: 9 (ref 5–15)
BUN: 29 mg/dL — ABNORMAL HIGH (ref 8–23)
CO2: 30 mmol/L (ref 22–32)
Calcium: 10.2 mg/dL (ref 8.9–10.3)
Chloride: 97 mmol/L — ABNORMAL LOW (ref 98–111)
Creatinine, Ser: 1.79 mg/dL — ABNORMAL HIGH (ref 0.44–1.00)
GFR, Estimated: 30 mL/min — ABNORMAL LOW
Glucose, Bld: 147 mg/dL — ABNORMAL HIGH (ref 70–99)
Potassium: 4.1 mmol/L (ref 3.5–5.1)
Sodium: 136 mmol/L (ref 135–145)
Total Bilirubin: 0.3 mg/dL (ref 0.0–1.2)
Total Protein: 6.9 g/dL (ref 6.5–8.1)

## 2024-03-16 LAB — RESP PANEL BY RT-PCR (RSV, FLU A&B, COVID)  RVPGX2
Influenza A by PCR: NEGATIVE
Influenza B by PCR: NEGATIVE
Resp Syncytial Virus by PCR: NEGATIVE
SARS Coronavirus 2 by RT PCR: NEGATIVE

## 2024-03-16 LAB — URINALYSIS, COMPLETE (UACMP) WITH MICROSCOPIC
Bacteria, UA: NONE SEEN
Bilirubin Urine: NEGATIVE
Glucose, UA: >=500 mg/dL — AB
Hgb urine dipstick: NEGATIVE
Ketones, ur: NEGATIVE mg/dL
Nitrite: NEGATIVE
Protein, ur: NEGATIVE mg/dL
RBC / HPF: 0 RBC/hpf (ref 0–5)
Specific Gravity, Urine: 1.011 (ref 1.005–1.030)
pH: 7 (ref 5.0–8.0)

## 2024-03-16 LAB — PRO BRAIN NATRIURETIC PEPTIDE: Pro Brain Natriuretic Peptide: 1923 pg/mL — ABNORMAL HIGH

## 2024-03-16 LAB — TROPONIN T, HIGH SENSITIVITY
Troponin T High Sensitivity: 27 ng/L — ABNORMAL HIGH (ref 0–19)
Troponin T High Sensitivity: 23 ng/L — ABNORMAL HIGH (ref 0–19)

## 2024-03-16 LAB — MAGNESIUM: Magnesium: 2.2 mg/dL (ref 1.7–2.4)

## 2024-03-16 SURGERY — ICD IMPLANT
Anesthesia: Moderate Sedation

## 2024-03-16 MED ORDER — CEPHALEXIN 500 MG PO CAPS: 500.0000 mg | ORAL_CAPSULE | Freq: Two times a day (BID) | ORAL | 0 refills | Status: DC

## 2024-03-16 MED ORDER — CEPHALEXIN 500 MG PO CAPS: 500.0000 mg | ORAL_CAPSULE | Freq: Two times a day (BID) | ORAL | 0 refills | Status: AC

## 2024-03-16 MED ORDER — SODIUM CHLORIDE 0.9 % IV SOLN
1.0000 g | Freq: Once | INTRAVENOUS | Status: AC
  Administered 2024-03-16: 1 g via INTRAVENOUS
  Filled 2024-03-16: qty 10

## 2024-03-16 MED ORDER — SODIUM CHLORIDE 0.9 % IV BOLUS
250.0000 mL | Freq: Once | INTRAVENOUS | Status: AC
  Administered 2024-03-16: 250 mL via INTRAVENOUS

## 2024-03-16 MED ORDER — MECLIZINE HCL 12.5 MG PO TABS: 12.5000 mg | ORAL_TABLET | Freq: Three times a day (TID) | ORAL | 0 refills | Status: DC | PRN

## 2024-03-16 MED ORDER — MECLIZINE HCL 25 MG PO TABS
25.0000 mg | ORAL_TABLET | Freq: Once | ORAL | Status: AC
  Administered 2024-03-16: 25 mg via ORAL
  Filled 2024-03-16: qty 1

## 2024-03-16 MED ORDER — MECLIZINE HCL 12.5 MG PO TABS: 12.5000 mg | ORAL_TABLET | Freq: Three times a day (TID) | ORAL | 0 refills | Status: AC | PRN

## 2024-03-16 NOTE — Consult Note (Signed)
 " [facility] CLINIC CARDIOLOGY CONSULT NOTE       Patient ID: Jodi Chavez MRN: [id number] DOB/AGE: 1950-03-07 74 y.o.  Admit date: 03/16/2024 Referring Physician Dr. Darron Primary Physician Shelda Atlas, MD  Primary Cardiologist Dr. Florencio Reason for Consultation pre-syncope  HPI: Jodi Chavez is a 74 y.o. female  with a past medical history of coronary artery disease not amenable to CABG, chronic systolic heart failure, hx COVID-related myocarditis, chronic renal sufficiency stage IIIa, hypertension, hyperlipidemia, diabetes, obesity, diabetic neuropathy who presented to the ED on 03/16/2024 for nausea, dizziness. Cardiology was consulted for further evaluation.   Patient brought in by EMS after experiencing nausea worse than baseline this AM with associated dizziness and palpitations. Workup in the ED notable for creatinine 1.79, potassium 4.1, hemoglobin 13.4, WBC 7.4. Troponins 27, BNP 1923. No EKG today for review. No events noted on telemetry - NSR in the 60s.  At the time of my evaluation this AM, patient is sitting upright in hospital bed. States that this morning while getting ready for her ICD implant procedure, she had nausea which was worse than her baseline chronic symptoms. Also reported dizziness/lightheadedness without loss of consciousness. Had some SOB and palpitations with this as well, all of which are resolved. Denies any chest pain.    Review of systems complete and found to be negative unless listed above    Past Medical History:  Diagnosis Date   Acute respiratory failure with hypoxia (HCC) 05/27/2022   Arthritis    hip   CHF (congestive heart failure) (HCC)    Constipation    uses laxatives several times a week   Depression    Diabetes mellitus    takes Metformin  and Amaryl  daily   Dizziness    occasionally and related to meds    Early cataracts, bilateral    History of bronchitis    last time several yrs ago   History of migraine    many yrs ago    Hyperlipidemia    takes Zetia  and Zocor  daily   Hypertension    takes Benazepril  nightly and Propranolol  tid and Clonidine  daily   Insomnia    Low back pain    Peripheral edema    Peripheral neuropathy    PONV (postoperative nausea and vomiting)    Slow urinary stream    occasionally    Past Surgical History:  Procedure Laterality Date   COLONOSCOPY     CORONARY BALLOON ANGIOPLASTY N/A 05/15/2023   Procedure: CORONARY BALLOON ANGIOPLASTY;  Surgeon: Darron Deatrice LABOR, MD;  Location: [facility] INVASIVE CV LAB;  Service: Cardiovascular;  Laterality: N/A;   d&c/hysteroscopy/ablation     DILATION AND CURETTAGE OF UTERUS     couple of times   ESOPHAGOGASTRODUODENOSCOPY     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   LEFT HEART CATH AND CORONARY ANGIOGRAPHY Left 01/14/2023   Procedure: LEFT HEART CATH AND CORONARY ANGIOGRAPHY;  Surgeon: Florencio Cara BIRCH, MD;  Location: [facility] INVASIVE CV LAB;  Service: Cardiovascular;  Laterality: Left;   LEFT HEART CATH AND CORONARY ANGIOGRAPHY N/A 05/15/2023   Procedure: LEFT HEART CATH AND CORONARY ANGIOGRAPHY;  Surgeon: Darron Deatrice LABOR, MD;  Location: [facility] INVASIVE CV LAB;  Service: Cardiovascular;  Laterality: N/A;   POSTERIOR CERVICAL FUSION/FORAMINOTOMY  10/15/2011   Procedure: POSTERIOR CERVICAL FUSION/FORAMINOTOMY LEVEL 2;  Surgeon: Lynwood FORBES Better, MD;  Location: [facility] OR;  Service: Orthopedics;  Laterality: N/A;  Right C6-7, C7-T1 Foraminotomy with excision HNP C7-T1  UPPER GASTROINTESTINAL ENDOSCOPY      (Not in a hospital admission)  Social History   Socioeconomic History   Marital status: Widowed    Spouse name: Not on file   Number of children: Not on file   Years of education: Not on file   Highest education level: Not on file  Occupational History   Not on file  Tobacco Use   Smoking status: Never   Smokeless tobacco: Never  Vaping Use   Vaping status: Never Used  Substance and Sexual Activity   Alcohol use: No   Drug  use: No   Sexual activity: Never  Other Topics Concern   Not on file  Social History Narrative   Not on file   Social Drivers of Health   Tobacco Use: Low Risk (03/10/2024)   Patient History    Smoking Tobacco Use: Never    Smokeless Tobacco Use: Never    Passive Exposure: Not on file  Financial Resource Strain: Not on file  Food Insecurity: No Food Insecurity (09/08/2022)   Hunger Vital Sign    Worried About Running Out of Food in the Last Year: Never true    Ran Out of Food in the Last Year: Never true  Transportation Needs: No Transportation Needs (09/08/2022)   PRAPARE - Administrator, Civil Service (Medical): No    Lack of Transportation (Non-Medical): No  Physical Activity: Not on file  Stress: Not on file  Social Connections: Not on file  Intimate Partner Violence: Not At Risk (09/08/2022)   Humiliation, Afraid, Rape, and Kick questionnaire    Fear of Current or Ex-Partner: No    Emotionally Abused: No    Physically Abused: No    Sexually Abused: No  Depression (PHQ2-9): Not on file  Alcohol Screen: Not on file  Housing: Low Risk (09/08/2022)   Housing    Last Housing Risk Score: 0  Utilities: Not At Risk (09/08/2022)   AHC Utilities    Threatened with loss of utilities: No  Health Literacy: Not on file    Family History  Problem Relation Age of Onset   Cancer Mother    Stroke Mother    Diabetes Mother    Hypertension Mother    Colon cancer Mother 37       died 2005-04-20   Gout Father    Heart disease Father      Vitals:   03/16/24 0810 03/16/24 0811  BP: (!) 124/95   Pulse: 65   Resp: 14   Temp: (!) 97.4 F (36.3 C)   TempSrc: Oral   SpO2: 100%   Weight:  80.7 kg  Height:  5' 1 (1.549 m)    PHYSICAL EXAM General: Chronically ill appearing female, well nourished, in no acute distress. HEENT: Normocephalic and atraumatic. Neck: No JVD.  Lungs: Normal respiratory effort on room air. Clear bilaterally to auscultation. No wheezes, crackles,  rhonchi.  Heart: HRRR. Normal S1 and S2 without gallops or murmurs.  Abdomen: Non-distended appearing.  Msk: Normal strength and tone for age. Extremities: Warm and well perfused. No clubbing, cyanosis. No edema.  Neuro: Alert and oriented X 3. Psych: Answers questions appropriately.   Labs: Basic Metabolic Panel: Recent Labs    03/16/24 0820  NA 136  K 4.1  CL 97*  CO2 30  GLUCOSE 147*  BUN 29*  CREATININE 1.79*  CALCIUM  10.2  MG 2.2   Liver Function Tests: Recent Labs    03/16/24 0820  AST 26  ALT 19  ALKPHOS 86  BILITOT 0.3  PROT 6.9  ALBUMIN 4.1   No results for input(s): LIPASE, AMYLASE in the last 72 hours. CBC: Recent Labs    03/16/24 0820  WBC 7.4  NEUTROABS 5.4  HGB 13.4  HCT 41.2  MCV 100.0  PLT 186   Cardiac Enzymes: No results for input(s): CKTOTAL, CKMB, CKMBINDEX, TROPONINIHS in the last 72 hours. BNP: No results for input(s): BNP in the last 72 hours. D-Dimer: No results for input(s): DDIMER in the last 72 hours. Hemoglobin A1C: No results for input(s): HGBA1C in the last 72 hours. Fasting Lipid Panel: No results for input(s): CHOL, HDL, LDLCALC, TRIG, CHOLHDL, LDLDIRECT in the last 72 hours. Thyroid  Function Tests: No results for input(s): TSH, T4TOTAL, T3FREE, THYROIDAB in the last 72 hours.  Invalid input(s): FREET3 Anemia Panel: No results for input(s): VITAMINB12, FOLATE, FERRITIN, TIBC, IRON, RETICCTPCT in the last 72 hours.   Radiology: DG Chest Portable 1 View Result Date: 03/16/2024 EXAM: 1 VIEW(S) XRAY OF THE CHEST 03/16/2024 08:30:00 AM COMPARISON: 01/21/2024 CLINICAL HISTORY: palpitations palpitations palpitations FINDINGS: LUNGS AND PLEURA: Low lung volumes with bronchovascular crowding. Mild bibasilar pulmonary opacities. There is mild elevation of the right hemidiaphragm. No pleural effusion. No pneumothorax. HEART AND MEDIASTINUM: No acute abnormality of the cardiac and  mediastinal silhouettes. There is mild calcific atheromatous disease within the aortic arch. BONES AND SOFT TISSUES: No acute osseous abnormality. There is bowel interposed between the liver and right hemidiaphragm, as before. IMPRESSION: 1. Low lung volumes with bronchovascular crowding and mild bibasilar pulmonary opacities, favor atelectasis. 2. Mild elevation of the right hemidiaphragm with bowel interposed between the liver and right hemidiaphragm, as before. Electronically signed by: Evalene Coho MD 03/16/2024 08:36 AM EST RP Workstation: [id number]    ECHO 09/2023: 1. Left ventricular ejection fraction, by estimation, is 30 to 35%. The left ventricle has moderately decreased function. The left ventricle demonstrates regional wall motion abnormalities (see scoring diagram/findings for description). Left ventricular diastolic parameters are consistent with Grade II diastolic dysfunction (pseudonormalization). Elevated left atrial pressure.   2. Right ventricular systolic function is normal. The right ventricular size is normal. Tricuspid regurgitation signal is inadequate for assessing PA pressure.   3. The mitral valve is degenerative. Mild mitral valve regurgitation. No  evidence of mitral stenosis. Severe mitral annular calcification.   4. The aortic valve is tricuspid. There is mild thickening of the aortic  valve. Aortic valve regurgitation is not visualized. Aortic valve  sclerosis is present, with no evidence of aortic valve stenosis.   5. The inferior vena cava is normal in size with greater than 50%  respiratory variability, suggesting right atrial pressure of 3 mmHg.   TELEMETRY (personally reviewed): sinus rhythm rate 60s  EKG (personally reviewed): none this admit for review  Data reviewed by me 03/16/2024: last 24h vitals tele labs imaging I/O ED provider note, admission H&P  Active Problems:   * No active hospital problems. *    ASSESSMENT AND PLAN:  Jodi Chavez is a  74 y.o. female  with a past medical history of coronary artery disease not amenable to CABG, chronic systolic heart failure, hx COVID-related myocarditis, chronic renal sufficiency stage IIIa, hypertension, hyperlipidemia, diabetes, obesity, diabetic neuropathy who presented to the ED on 03/16/2024 for nausea, dizziness. Cardiology was consulted for further evaluation.   # Dizziness/pre-syncope # Nausea # Ischemic cardiomyopathy # Coronary artery disease Patient presented with worsening nausea from baseline as well as dizziness/near syncope this AM.  Had associated palpitations. Symptoms resolving. HR controlled on tele with no events. Troponins minimal. Was scheduled for outpatient ICD implant this AM. BP stable, no hypotension. -Discussed with Dr. Sidra Kitty [facility] EP who will have ICD implant procedure rescheduled.  -No indication for further cardiac workup at this time.  -Minimal troponin most consistent with demand/supply mismatch and not ACS.   This patient's plan of care was discussed and created with Dr. Florencio and he is in agreement.  Signed: Danita Bloch, PA-C  03/16/2024, 9:24 AM [facility] Cardiology      "

## 2024-03-16 NOTE — Discharge Instructions (Signed)
 Your evaluation in the emergency department was overall reassuring.  Your symptoms may impart be due to a urinary tract infection, and I prescribed a medication to treat this.  I also prescribed her dizziness medication to use as needed.  Please do follow-up closely with your cardiologist and primary care provider for reevaluation, and return to the emergency department with any new or worsening symptoms.

## 2024-03-16 NOTE — Telephone Encounter (Signed)
 Pt currently in ED at [facility]. ICD implant was scheduled for today with Dr Kennyth.

## 2024-03-16 NOTE — ED Notes (Signed)
 This RN ambulated pt around room w/ walker, gait steady. Pt tolerated well, no complaints of SOB/ dizziness/ or palpitations. Pt back in bed, falls bundle in place.

## 2024-03-16 NOTE — ED Provider Notes (Signed)
 "  [facility] Provider Note    Event Date/Time   First MD Initiated Contact with Patient 03/16/24 0805     (approximate)   History   Dizziness  BIBEMS from home. Pt c/o nausea and dizziness that started today at around 7am. Pt took prescribed zofran  at 630am. Pt given additional 4 mg of zofran  en route. Pt positive for orthostatic hypotension w/ EMS, systolic came down from 128 to 100. Pt has defibrillator placement scheduled today at [facility].   EMS VS:  120/72 64 HR   HPI Jodi Chavez is a 74 y.o. female PMH chronic systolic heart failure secondary to ischemic cardiomyopathy, hypertension, hyperlipidemia, diabetes, peripheral neuropathy, three-vessel CAD presents for evaluation of dizziness, nausea - Patient states she was getting ready to leave her house to come have her defibrillator placed when she suddenly developed a sensation that she may pass out, nausea, palpitations.  Lasted about 15-20 minutes.  Some ongoing mild nausea although has largely resolved with Zofran  which she received prior to arrival. - Had otherwise been in her usual state of health.  No recent infectious symptoms.  No abdominal pain.  Did not experience any chest pain. - Felt well this morning otherwise.  Acute onset of symptoms.   Per chart review, last seen by cardiology on 03/10/2024 for evaluation of ICD placement.  Appears last echocardiogram was in August 2025, EF 30-35% at that time.  Also grade 2 diastolic dysfunction and some left ventricular regional wall motion abnormalities.  Was planned to have ICD implanted by Dr. Kennyth today.  Also on chart review, appears patient was admitted in November 2025 for a syncopal episode.  No findings on telemetry.  Planned to follow-up outpatient with cardiology.     Physical Exam   Triage Vital Signs:   Most recent vital signs: Vitals:   03/16/24 0810 03/16/24 1212  BP: (!) 124/95 127/60  Pulse: 65 61   Resp: 14 17  Temp: (!) 97.4 F (36.3 C) 97.6 F (36.4 C)  SpO2: 100% 94%     General: Awake, no distress.  CV:  Good peripheral perfusion. RRR, RP 2+ Resp:  Normal effort. CTAB Abd:  No distention. Nontender to deep palpation throughout    ED Results / Procedures / Treatments   Labs (all labs ordered are listed, but only abnormal results are displayed) Labs Reviewed  PRO BRAIN NATRIURETIC PEPTIDE - Abnormal; Notable for the following components:      Result Value   Pro Brain Natriuretic Peptide 1,923.0 (*)    All other components within normal limits  COMPREHENSIVE METABOLIC PANEL WITH GFR - Abnormal; Notable for the following components:   Chloride 97 (*)    Glucose, Bld 147 (*)    BUN 29 (*)    Creatinine, Ser 1.79 (*)    GFR, Estimated 30 (*)    All other components within normal limits  URINALYSIS, COMPLETE (UACMP) WITH MICROSCOPIC - Abnormal; Notable for the following components:   Color, Urine YELLOW (*)    APPearance CLEAR (*)    Glucose, UA >=500 (*)    Leukocytes,Ua TRACE (*)    All other components within normal limits  TROPONIN T, HIGH SENSITIVITY - Abnormal; Notable for the following components:   Troponin T High Sensitivity 27 (*)    All other components within normal limits  TROPONIN T, HIGH SENSITIVITY - Abnormal; Notable for the following components:   Troponin T High Sensitivity 23 (*)  All other components within normal limits  RESP PANEL BY RT-PCR (RSV, FLU A&B, COVID)  RVPGX2  CBC WITH DIFFERENTIAL/PLATELET  MAGNESIUM      EKG   See below.   RADIOLOGY Radiology interpreted by myself radiology report reviewed.  No acute pathology identified.    PROCEDURES:  Critical Care performed: No  .1-3 Lead EKG Interpretation  Performed by: Clarine Ozell LABOR, MD Authorized by: Clarine Ozell LABOR, MD     Interpretation: non-specific     ECG rate:  60   ECG rate assessment: normal     Rhythm: sinus rhythm   Comments:     Nsr, rate 60, no  gross ST elevation or depression, normal axis interventricular conduction delay, no significant repolarization abnormality.  No clear evidence of ischemia nor arrhythmia.  Overall similar to prior from November/2025.    MEDICATIONS ORDERED IN ED: Medications  cefTRIAXone  (ROCEPHIN ) 1 g in sodium chloride  0.9 % 100 mL IVPB (1 g Intravenous New Bag/Given 03/16/24 1210)  meclizine  (ANTIVERT ) tablet 25 mg (25 mg Oral Given 03/16/24 0918)  sodium chloride  0.9 % bolus 250 mL (0 mLs Intravenous Stopped 03/16/24 1044)     IMPRESSION / MDM / ASSESSMENT AND PLAN / ED COURSE  I reviewed the triage vital signs and the nursing notes.                              DDX/MDM/AP: Differential diagnosis includes, but is not limited to, transient arrhythmia, considered but doubt ACS, doubt viral syndrome given acute onset of symptoms.  Doubt pneumothorax.  Does not clinically appear to be hypervolemic on my eval.  Plan: - Labs - EKG - Chest x-ray - Will discuss with cardiology  Patient's presentation is most consistent with acute presentation with potential threat to life or bodily function.  The patient is on the cardiac monitor to evaluate for evidence of arrhythmia and/or significant heart rate changes.  ED course below.  Workup overall reassuring.  Cardiology evaluated patient, do not suspect cardiac etiology of presentation today, will reschedule for defibrillator placement.  Did have notable pyuria--given dizziness and this elderly female we will treat empirically for UTI.  Received 1 g IV ceftriaxone  and discharged on Keflex .  Plan for PMD and cardiology follow-up.  ED return precautions in place.  Patient agrees with plan.  Viral swab pending, can follow as outpatient.  Clinical Course as of 03/16/24 1229  Mon Mar 16, 2024  9183 Paging cardiology to notify them pt in ED. Was supposed to have ICD placement at 8:30am w/ Dr. Kennyth.  [MM]  [phone] D/w Dr. Darron of cardiology - He will discuss with  Dr. Kennyth and patient's primary cardiologist (Dr. Florencio) regarding next steps [MM]  [phone] Chest x-ray interpreted by myself.  Poor inspiration but otherwise unremarkable, no acute pathology identified.  Stable from prior in November 2025.  Formal radiology read pending. [MM]  0837 Cbc wnl [MM]  0843 CXR formal read: IMPRESSION: 1. Low lung volumes with bronchovascular crowding and mild bibasilar pulmonary opacities, favor atelectasis. 2. Mild elevation of the right hemidiaphragm with bowel interposed between the liver and right hemidiaphragm, as before.   [MM]  0858 BNP elevated and similar to prior   [MM]  0859 Troponin very mildly elevated, similar to baseline [MM]  0859 CMP at baseline [MM]  0909 Caralyn Hudson PA-C of cardiology evaluated patient, suspect exacerbation of chronic nausea.  Do not suspect cardiac etiology of presentation at  this time.  Recommend no further workup.  Cardiology team in agreement.  Will schedule her for ICD placement on a different day. [MM]  0920 Patient complaining of some more lightheadedness.  Will give small fluid bolus and meclizine .  Also complaining of worse nausea than usual, will add viral swab. [MM]  1157 Urinalysis with pyuria which may be suggestive of underlying UTI open will treat empirically and started patient with dizziness and nausea. [MM]  1214 Rpt trop stable [MM]    Clinical Course User Index [MM] Clarine Ozell LABOR, MD     FINAL CLINICAL IMPRESSION(S) / ED DIAGNOSES   Final diagnoses:  Near syncope  Palpitations  Nausea  Acute cystitis without hematuria     Rx / DC Orders   ED Discharge Orders          Ordered    cephALEXin  (KEFLEX ) 500 MG capsule  2 times daily,   Status:  Discontinued        03/16/24 1159    cephALEXin  (KEFLEX ) 500 MG capsule  2 times daily        03/16/24 1228    meclizine  (ANTIVERT ) 12.5 MG tablet  3 times daily PRN,   Status:  Discontinued        03/16/24 1159    meclizine  (ANTIVERT ) 12.5 MG  tablet  3 times daily PRN        03/16/24 1228             Note:  This document was prepared using Dragon voice recognition software and may include unintentional dictation errors.   Clarine Ozell LABOR, MD 03/16/24 1229  "

## 2024-03-16 NOTE — ED Triage Notes (Signed)
 BIBEMS from home. Pt c/o nausea and dizziness that started today at around 7am. Pt took prescribed zofran  at 630am. Pt given additional 4 mg of zofran  en route. Pt positive for orthostatic hypotension w/ EMS, systolic came down from 128 to 100. Pt has defibrillator placement scheduled today at [facility].   EMS VS:  120/72 64 HR

## 2024-03-20 ENCOUNTER — Telehealth: Payer: Self-pay

## 2024-03-20 NOTE — Telephone Encounter (Signed)
 Left message for patient to call back to reschedule her procedure with Dr. Kennyth.

## 2024-03-20 NOTE — Telephone Encounter (Signed)
 Left message for patient to call back to discuss rescheduling her ICD implant with Dr. Kennyth.

## 2024-03-24 ENCOUNTER — Telehealth: Payer: Self-pay

## 2024-03-24 NOTE — Telephone Encounter (Signed)
 LM for pt to return my call to reschedule her ICD Implant with Dr. Kennyth.

## 2024-03-26 ENCOUNTER — Ambulatory Visit

## 2024-03-26 NOTE — Telephone Encounter (Signed)
 Left message for patient to call back

## 2024-03-30 NOTE — Telephone Encounter (Signed)
 Left a detailed message on VM stating that Dr. Kennyth only had 1 opening left at [facility] this month and if we didn't hear back from her soon to schedule in that time slot it would be the middle of March before we could get her scheduled at [facility] with him.

## 2024-04-13 ENCOUNTER — Ambulatory Visit: Admitting: Podiatry

## 2024-06-16 ENCOUNTER — Ambulatory Visit: Admitting: Cardiology
